# Patient Record
Sex: Female | Born: 1960
Health system: Southern US, Community
[De-identification: ages and names within clinical notes are randomized; demographics above are authoritative.]

## PROBLEM LIST (undated history)

## (undated) DIAGNOSIS — Z96659 Presence of unspecified artificial knee joint: Secondary | ICD-10-CM

## (undated) DIAGNOSIS — G4730 Sleep apnea, unspecified: Secondary | ICD-10-CM

## (undated) DIAGNOSIS — J449 Chronic obstructive pulmonary disease, unspecified: Secondary | ICD-10-CM

## (undated) DIAGNOSIS — E079 Disorder of thyroid, unspecified: Secondary | ICD-10-CM

## (undated) DIAGNOSIS — R7303 Prediabetes: Secondary | ICD-10-CM

## (undated) DIAGNOSIS — R112 Nausea with vomiting, unspecified: Secondary | ICD-10-CM

## (undated) DIAGNOSIS — R002 Palpitations: Secondary | ICD-10-CM

## (undated) DIAGNOSIS — I499 Cardiac arrhythmia, unspecified: Secondary | ICD-10-CM

## (undated) DIAGNOSIS — F419 Anxiety disorder, unspecified: Secondary | ICD-10-CM

## (undated) DIAGNOSIS — K219 Gastro-esophageal reflux disease without esophagitis: Secondary | ICD-10-CM

## (undated) DIAGNOSIS — M1990 Unspecified osteoarthritis, unspecified site: Secondary | ICD-10-CM

## (undated) DIAGNOSIS — Z9289 Personal history of other medical treatment: Secondary | ICD-10-CM

## (undated) DIAGNOSIS — T7840XA Allergy, unspecified, initial encounter: Secondary | ICD-10-CM

## (undated) DIAGNOSIS — J189 Pneumonia, unspecified organism: Secondary | ICD-10-CM

## (undated) DIAGNOSIS — Z8719 Personal history of other diseases of the digestive system: Secondary | ICD-10-CM

## (undated) DIAGNOSIS — Z9889 Other specified postprocedural states: Secondary | ICD-10-CM

## (undated) DIAGNOSIS — J42 Unspecified chronic bronchitis: Secondary | ICD-10-CM

## (undated) DIAGNOSIS — E785 Hyperlipidemia, unspecified: Secondary | ICD-10-CM

## (undated) DIAGNOSIS — E039 Hypothyroidism, unspecified: Secondary | ICD-10-CM

## (undated) DIAGNOSIS — E119 Type 2 diabetes mellitus without complications: Secondary | ICD-10-CM

## (undated) DIAGNOSIS — I10 Essential (primary) hypertension: Secondary | ICD-10-CM

## (undated) DIAGNOSIS — D649 Anemia, unspecified: Secondary | ICD-10-CM

## (undated) DIAGNOSIS — E559 Vitamin D deficiency, unspecified: Secondary | ICD-10-CM

## (undated) DIAGNOSIS — M810 Age-related osteoporosis without current pathological fracture: Secondary | ICD-10-CM

## (undated) DIAGNOSIS — C4491 Basal cell carcinoma of skin, unspecified: Secondary | ICD-10-CM

## (undated) HISTORY — DX: Personal history of other medical treatment: Z92.89

## (undated) HISTORY — DX: Age-related osteoporosis without current pathological fracture: M81.0

## (undated) HISTORY — PX: CARDIAC SURGERY: SHX584

## (undated) HISTORY — PX: KNEE ARTHROSCOPY: SUR90

## (undated) HISTORY — PX: POLYPECTOMY: SHX149

## (undated) HISTORY — DX: Hyperlipidemia, unspecified: E78.5

## (undated) HISTORY — DX: Allergy, unspecified, initial encounter: T78.40XA

## (undated) HISTORY — DX: Disorder of thyroid, unspecified: E07.9

## (undated) HISTORY — DX: Vitamin D deficiency, unspecified: E55.9

## (undated) HISTORY — PX: JOINT REPLACEMENT: SHX530

## (undated) HISTORY — PX: COLONOSCOPY: SHX174

## (undated) HISTORY — DX: Presence of unspecified artificial knee joint: Z96.659

## (undated) HISTORY — DX: Essential (primary) hypertension: I10

---

## 1985-08-13 HISTORY — PX: TUBAL LIGATION: SHX77

## 1988-08-13 HISTORY — PX: CARPAL TUNNEL RELEASE: SHX101

## 1991-08-14 HISTORY — PX: BLADDER REPAIR: SHX76

## 1998-02-14 ENCOUNTER — Emergency Department (HOSPITAL_COMMUNITY): Admission: EM | Admit: 1998-02-14 | Discharge: 1998-02-14 | Payer: Self-pay | Admitting: Emergency Medicine

## 1998-04-20 ENCOUNTER — Emergency Department (HOSPITAL_COMMUNITY): Admission: EM | Admit: 1998-04-20 | Discharge: 1998-04-20 | Payer: Self-pay | Admitting: Emergency Medicine

## 1998-09-12 ENCOUNTER — Emergency Department (HOSPITAL_COMMUNITY): Admission: EM | Admit: 1998-09-12 | Discharge: 1998-09-12 | Payer: Self-pay | Admitting: Emergency Medicine

## 1998-12-13 ENCOUNTER — Emergency Department (HOSPITAL_COMMUNITY): Admission: EM | Admit: 1998-12-13 | Discharge: 1998-12-13 | Payer: Self-pay | Admitting: Emergency Medicine

## 1999-05-04 ENCOUNTER — Encounter: Payer: Self-pay | Admitting: Emergency Medicine

## 1999-05-04 ENCOUNTER — Emergency Department (HOSPITAL_COMMUNITY): Admission: EM | Admit: 1999-05-04 | Discharge: 1999-05-04 | Payer: Self-pay | Admitting: Emergency Medicine

## 1999-10-09 ENCOUNTER — Other Ambulatory Visit: Admission: RE | Admit: 1999-10-09 | Discharge: 1999-10-09 | Payer: Self-pay | Admitting: Obstetrics and Gynecology

## 2000-03-17 ENCOUNTER — Encounter: Payer: Self-pay | Admitting: Emergency Medicine

## 2000-03-17 ENCOUNTER — Emergency Department (HOSPITAL_COMMUNITY): Admission: EM | Admit: 2000-03-17 | Discharge: 2000-03-17 | Payer: Self-pay | Admitting: Emergency Medicine

## 2000-07-02 ENCOUNTER — Encounter: Payer: Self-pay | Admitting: Emergency Medicine

## 2000-07-02 ENCOUNTER — Emergency Department (HOSPITAL_COMMUNITY): Admission: EM | Admit: 2000-07-02 | Discharge: 2000-07-02 | Payer: Self-pay | Admitting: Emergency Medicine

## 2001-05-30 ENCOUNTER — Encounter (INDEPENDENT_AMBULATORY_CARE_PROVIDER_SITE_OTHER): Payer: Self-pay | Admitting: *Deleted

## 2001-05-30 ENCOUNTER — Ambulatory Visit (HOSPITAL_COMMUNITY): Admission: RE | Admit: 2001-05-30 | Discharge: 2001-05-30 | Payer: Self-pay | Admitting: Gastroenterology

## 2001-05-30 ENCOUNTER — Encounter: Payer: Self-pay | Admitting: Gastroenterology

## 2001-06-02 ENCOUNTER — Ambulatory Visit (HOSPITAL_COMMUNITY): Admission: RE | Admit: 2001-06-02 | Discharge: 2001-06-02 | Payer: Self-pay | Admitting: Gastroenterology

## 2001-07-02 ENCOUNTER — Encounter: Payer: Self-pay | Admitting: Gastroenterology

## 2001-07-02 ENCOUNTER — Encounter: Admission: RE | Admit: 2001-07-02 | Discharge: 2001-07-02 | Payer: Self-pay | Admitting: Gastroenterology

## 2001-07-21 ENCOUNTER — Encounter: Payer: Self-pay | Admitting: Gastroenterology

## 2001-07-21 ENCOUNTER — Ambulatory Visit (HOSPITAL_COMMUNITY): Admission: RE | Admit: 2001-07-21 | Discharge: 2001-07-21 | Payer: Self-pay | Admitting: Gastroenterology

## 2001-11-16 ENCOUNTER — Emergency Department (HOSPITAL_COMMUNITY): Admission: EM | Admit: 2001-11-16 | Discharge: 2001-11-16 | Payer: Self-pay | Admitting: Emergency Medicine

## 2001-11-16 ENCOUNTER — Encounter: Payer: Self-pay | Admitting: *Deleted

## 2004-02-17 ENCOUNTER — Emergency Department (HOSPITAL_COMMUNITY): Admission: EM | Admit: 2004-02-17 | Discharge: 2004-02-17 | Payer: Self-pay | Admitting: Emergency Medicine

## 2004-06-18 ENCOUNTER — Emergency Department (HOSPITAL_COMMUNITY): Admission: AC | Admit: 2004-06-18 | Discharge: 2004-06-18 | Payer: Self-pay

## 2004-10-23 ENCOUNTER — Other Ambulatory Visit: Admission: RE | Admit: 2004-10-23 | Discharge: 2004-10-23 | Payer: Self-pay | Admitting: Internal Medicine

## 2004-10-23 LAB — HM PAP SMEAR: HM Pap smear: NORMAL

## 2005-01-05 ENCOUNTER — Ambulatory Visit (HOSPITAL_COMMUNITY): Admission: RE | Admit: 2005-01-05 | Discharge: 2005-01-05 | Payer: Self-pay | Admitting: Urology

## 2005-01-05 ENCOUNTER — Ambulatory Visit (HOSPITAL_BASED_OUTPATIENT_CLINIC_OR_DEPARTMENT_OTHER): Admission: RE | Admit: 2005-01-05 | Discharge: 2005-01-05 | Payer: Self-pay | Admitting: Urology

## 2005-12-05 ENCOUNTER — Encounter: Admission: RE | Admit: 2005-12-05 | Discharge: 2005-12-05 | Payer: Self-pay | Admitting: Orthopedic Surgery

## 2006-07-22 ENCOUNTER — Ambulatory Visit (HOSPITAL_COMMUNITY): Admission: RE | Admit: 2006-07-22 | Discharge: 2006-07-22 | Payer: Self-pay | Admitting: Internal Medicine

## 2007-08-14 HISTORY — PX: ENDOMETRIAL ABLATION: SHX621

## 2008-02-22 ENCOUNTER — Encounter: Admission: RE | Admit: 2008-02-22 | Discharge: 2008-02-22 | Payer: Self-pay | Admitting: Internal Medicine

## 2008-08-13 HISTORY — PX: INCONTINENCE SURGERY: SHX676

## 2008-10-13 ENCOUNTER — Ambulatory Visit (HOSPITAL_COMMUNITY): Admission: RE | Admit: 2008-10-13 | Discharge: 2008-10-13 | Payer: Self-pay | Admitting: Internal Medicine

## 2009-06-22 ENCOUNTER — Encounter: Admission: RE | Admit: 2009-06-22 | Discharge: 2009-06-22 | Payer: Self-pay | Admitting: Orthopedic Surgery

## 2009-08-25 ENCOUNTER — Encounter: Admission: RE | Admit: 2009-08-25 | Discharge: 2009-08-25 | Payer: Self-pay | Admitting: Occupational Medicine

## 2010-03-08 ENCOUNTER — Encounter: Admission: RE | Admit: 2010-03-08 | Discharge: 2010-03-08 | Payer: Self-pay | Admitting: Gastroenterology

## 2010-04-29 ENCOUNTER — Emergency Department (HOSPITAL_COMMUNITY): Admission: EM | Admit: 2010-04-29 | Discharge: 2010-04-29 | Payer: Self-pay | Admitting: Emergency Medicine

## 2010-05-09 ENCOUNTER — Encounter: Admission: RE | Admit: 2010-05-09 | Discharge: 2010-05-09 | Payer: Self-pay | Admitting: Orthopedic Surgery

## 2010-05-31 ENCOUNTER — Emergency Department (HOSPITAL_COMMUNITY): Admission: EM | Admit: 2010-05-31 | Discharge: 2010-05-31 | Payer: Self-pay | Admitting: Family Medicine

## 2010-08-28 ENCOUNTER — Encounter
Admission: RE | Admit: 2010-08-28 | Discharge: 2010-08-28 | Payer: Self-pay | Source: Home / Self Care | Attending: Internal Medicine | Admitting: Internal Medicine

## 2010-09-03 ENCOUNTER — Encounter: Payer: Self-pay | Admitting: Gastroenterology

## 2010-09-06 ENCOUNTER — Encounter
Admission: RE | Admit: 2010-09-06 | Discharge: 2010-09-06 | Payer: Self-pay | Source: Home / Self Care | Attending: Internal Medicine | Admitting: Internal Medicine

## 2010-09-12 ENCOUNTER — Other Ambulatory Visit: Payer: Self-pay | Admitting: Radiology

## 2010-09-12 ENCOUNTER — Encounter
Admission: RE | Admit: 2010-09-12 | Discharge: 2010-09-12 | Payer: Self-pay | Source: Home / Self Care | Attending: Internal Medicine | Admitting: Internal Medicine

## 2010-12-29 NOTE — Op Note (Signed)
NAME:  Sherry Dennis, Sherry Dennis                  ACCOUNT NO.:  [id number]   MEDICAL RECORD NO.:  [id number]          PATIENT TYPE:  AMB   LOCATION:  [facility]                         FACILITY:  [facility]   PHYSICIAN:  Mark C. Vernie Ammons, M.D.  DATE OF BIRTH:  28-Mar-1961   DATE OF PROCEDURE:  01/05/2005  DATE OF DISCHARGE:                                 OPERATIVE REPORT   PREOPERATIVE DIAGNOSIS:  Stress urinary incontinence.   POSTOPERATIVE DIAGNOSIS:  Stress urinary incontinence.   PROCEDURE:  Transobturator sling Conservation officer, historic buildings).   SURGEON:  Dr. Vernie Ammons.   ANESTHESIA:  General.   BLOOD LOSS:  Minimal.   DRAINS:  None.   SPECIMENS:  None.   COMPLICATIONS:  None.   INDICATIONS:  The patient is a 50 year old white female, who has had  worsening urinary incontinence occurring primarily with coughing, sneezing,  abdominal straining, requiring a pad.  She had some frequency, urgency, and  nocturia and therefore, urodynamics were performed, but she was found to  have no bladder instability.  She therefore is brought to the OR for  surgical correction of her incontinence.  She understands the risks,  complications, and alternatives.   DESCRIPTION OF OPERATION:  After informed consent, the patient brought to  the major OR, placed on the table, administered general anesthesia, then  moved to the dorsal lithotomy position.  Her genitalia and vagina were  sterilely prepped and draped, and a 16-French Foley catheter was placed in  the bladder.  Lidocaine 1% with epinephrine was used to infiltrate the  subvaginal mucosa in the midline, and then palpation of the balloon at the  bladder neck allowed determination of the mid urethral region.  While  awaiting the epinephrine effects, I made stab incisions at the medial aspect  of the obturator fossa 5 cm lateral and at the level of the clitoris.  I  then made a midline incision beneath the urethra and dissected laterally on  both sides of the urethra.  The  bladder was then fully drained, and the  obturator trocar was then passed through the skin incision first on the left-  hand side and out at the mid urethral level through the vaginal incision.  This was performed on the right side as well in an identical fashion.  I  then hooked the sling material to the obturators and brought that back  through the skin incisions.   The catheter was removed, and the 21-French cystoscope was then inserted in  the bladder with 70-degree lens.  The bladder was fully inspect and noted be  free of any tumor, stones, or inflammatory lesions.  No foreign body or  injury were noted.  The ureteral orifices normal in configuration and  position.  I therefore removed the cystoscope and reinserted the Foley  catheter.  The sling was then tightened to the correct tension where it laid  just up against the urethra with no tension.  The plastic sheathing was then  removed from the right and then left sides, and I then placed a right angle  beneath the urethra a second time  to ensure that no extra tension was  applied.  None was noted in the sling, and the sling was still in good  position at the mid urethra with no undue tension.  I therefore cut the  excess sling material at the skin level and then closed the skin incisions  with Dermabond.  I irrigated the vaginal incision with antibiotic solution  and then closed that with running locking 2-0 Vicryl suture.  The catheter  was then removed, and the patient was awakened and taken to the recovery  room in stable satisfactory condition.  She tolerated the procedure well  with no intraoperative complications.   She will be given a prescription for Vicodin HP #36 and be maintained on  Cipro 500 mg b.i.d. for 5 days.  She will return to my office in 7 days for  follow-up.      MCO/MEDQ  D:  01/05/2005  T:  01/05/2005  Job:  [id number]

## 2010-12-29 NOTE — Procedures (Signed)
[facility]  Patient:    DEDRA, MATSUO Visit Number: [id number] MRN: [id number]          Service Type: Attending:  Petra Kuba, M.D. Dictated by:   Petra Kuba, M.D. Proc. Date: 05/30/01   CC:         Ammie Dalton, M.D.                           Procedure Report  PROCEDURE:  Esophagogastroduodenoscopy with biopsy and Savary dilatation.  INDICATION:  Upper tract symptoms and dysphagia not responding to the usual medicines.  Consent was signed after the risks, benefits, methods, and options were thoroughly discussed in the office.  MEDICATIONS:  Demerol 70 mg, Versed 8 mg.  DESCRIPTION OF PROCEDURE:  The video endoscope was inserted by direct vision. The esophagus was normal.  In the distal esophagus was a small hiatal hernia. No obvious stricture or ring seen.  The scope passed easily into the stomach and advanced through a normal pylorus into a normal duodenal bulb, then around the C-loop to a normal second portion of the duodenum.  A normal-appearing ampulla was seen.  The scope was slowly withdrawn back to the bulb, which again looked normal.  The scope was withdrawn back to the stomach and retroflexed.  High in the cardia, the hiatal hernia was confirmed.  There was both some proximal gastritis and some distal antritis.  The lesser and greater curve on retroflex visualization and straight visualization looked normal. The scope was then slowly withdrawn back to 20 cm.  No additional findings were seen.  The scope was readvanced in the stomach, and two proximal biopsies and two biopsies of the antrum were obtained at the area of inflammation.  The Savary wire was then advanced and confirmed in the proper position in the stomach under fluoroscopy with the customary J-loop being seen in the antrum. The scope was removed, making sure to keep the wire in the proper location under fluoroscopy.  Then we went ahead and proceeded with a 16 mm  Savary dilator under fluoroscopy.  Once the dilator was confirmed in the proper position in the stomach, the dilator and the wire were removed in tandem.  The procedure was terminated.  The patient tolerated the procedure well.  There was no obvious immediate complication.  ENDOSCOPIC DIAGNOSES: 1. Small hiatal hernia without any obvious ring or stricture. 2. Gastritis, both proximal and distal, status post biopsy. 3. Otherwise normal EGD.  THERAPY:  Savary dilatation to 16 mm under fluoroscopy.  PLAN:  See how the dilation worked.  Go ahead and recheck symptoms.  Await pathology.  Consider a trial of treatment of Helicobacter if present; otherwise, would probably try a motility agent like Reglan next.  Call p.r.n. and otherwise follow in two months. Dictated by:   Petra Kuba, M.D. Attending:  Petra Kuba, M.D. DD:  05/30/01 TD:  06/02/01 Job: [id number]

## 2011-05-08 ENCOUNTER — Other Ambulatory Visit: Payer: Self-pay | Admitting: Orthopedic Surgery

## 2011-05-08 DIAGNOSIS — M47818 Spondylosis without myelopathy or radiculopathy, sacral and sacrococcygeal region: Secondary | ICD-10-CM

## 2011-05-09 ENCOUNTER — Ambulatory Visit
Admission: RE | Admit: 2011-05-09 | Discharge: 2011-05-09 | Disposition: A | Discharge status: Home / Self Care | Payer: 59 | Source: Ambulatory Visit | Attending: Orthopedic Surgery | Admitting: Orthopedic Surgery

## 2011-05-09 DIAGNOSIS — M47818 Spondylosis without myelopathy or radiculopathy, sacral and sacrococcygeal region: Secondary | ICD-10-CM

## 2011-05-09 DIAGNOSIS — M461 Sacroiliitis, not elsewhere classified: Secondary | ICD-10-CM

## 2011-05-09 MED ORDER — METHYLPREDNISOLONE ACETATE 40 MG/ML INJ SUSP (RADIOLOG
120.0000 mg | Freq: Once | INTRAMUSCULAR | Status: AC
  Administered 2011-05-09: 120 mg via INTRA_ARTICULAR

## 2011-05-09 MED ORDER — IOHEXOL 180 MG/ML  SOLN
1.0000 mL | Freq: Once | INTRAMUSCULAR | Status: AC | PRN
  Administered 2011-05-09: 1 mL via INTRA_ARTICULAR

## 2011-08-14 HISTORY — PX: KNEE ARTHROSCOPY: SHX127

## 2011-12-17 ENCOUNTER — Other Ambulatory Visit: Payer: Self-pay | Admitting: Orthopedic Surgery

## 2011-12-17 DIAGNOSIS — M47818 Spondylosis without myelopathy or radiculopathy, sacral and sacrococcygeal region: Secondary | ICD-10-CM

## 2011-12-21 ENCOUNTER — Ambulatory Visit
Admission: RE | Admit: 2011-12-21 | Discharge: 2011-12-21 | Disposition: A | Discharge status: Home / Self Care | Payer: 59 | Source: Ambulatory Visit | Attending: Orthopedic Surgery | Admitting: Orthopedic Surgery

## 2011-12-21 ENCOUNTER — Other Ambulatory Visit: Payer: Self-pay | Admitting: Orthopedic Surgery

## 2011-12-21 DIAGNOSIS — M47818 Spondylosis without myelopathy or radiculopathy, sacral and sacrococcygeal region: Secondary | ICD-10-CM

## 2011-12-21 MED ORDER — IOHEXOL 180 MG/ML  SOLN
1.0000 mL | Freq: Once | INTRAMUSCULAR | Status: AC | PRN
  Administered 2011-12-21: 1 mL via INTRA_ARTICULAR

## 2011-12-21 MED ORDER — METHYLPREDNISOLONE ACETATE 40 MG/ML INJ SUSP (RADIOLOG
120.0000 mg | Freq: Once | INTRAMUSCULAR | Status: AC
  Administered 2011-12-21: 120 mg via INTRA_ARTICULAR

## 2012-02-21 ENCOUNTER — Ambulatory Visit
Admission: RE | Admit: 2012-02-21 | Discharge: 2012-02-21 | Disposition: A | Discharge status: Home / Self Care | Payer: 59 | Source: Ambulatory Visit | Attending: Orthopedic Surgery | Admitting: Orthopedic Surgery

## 2012-02-21 ENCOUNTER — Other Ambulatory Visit: Payer: Self-pay | Admitting: Orthopedic Surgery

## 2012-02-21 DIAGNOSIS — M79604 Pain in right leg: Secondary | ICD-10-CM

## 2012-03-03 ENCOUNTER — Other Ambulatory Visit: Payer: Self-pay | Admitting: Orthopedic Surgery

## 2012-03-03 DIAGNOSIS — M7120 Synovial cyst of popliteal space [Baker], unspecified knee: Secondary | ICD-10-CM

## 2012-03-11 ENCOUNTER — Ambulatory Visit
Admission: RE | Admit: 2012-03-11 | Discharge: 2012-03-11 | Disposition: A | Discharge status: Home / Self Care | Payer: 59 | Source: Ambulatory Visit | Attending: Orthopedic Surgery | Admitting: Orthopedic Surgery

## 2012-03-11 DIAGNOSIS — M7120 Synovial cyst of popliteal space [Baker], unspecified knee: Secondary | ICD-10-CM

## 2012-04-09 ENCOUNTER — Other Ambulatory Visit (HOSPITAL_COMMUNITY): Payer: Self-pay | Admitting: Internal Medicine

## 2012-04-09 DIAGNOSIS — Z1231 Encounter for screening mammogram for malignant neoplasm of breast: Secondary | ICD-10-CM

## 2012-05-07 ENCOUNTER — Ambulatory Visit (HOSPITAL_COMMUNITY)
Admission: RE | Admit: 2012-05-07 | Discharge: 2012-05-07 | Disposition: A | Discharge status: Home / Self Care | Payer: 59 | Source: Ambulatory Visit | Attending: Internal Medicine | Admitting: Internal Medicine

## 2012-05-07 ENCOUNTER — Other Ambulatory Visit (HOSPITAL_COMMUNITY): Payer: Self-pay | Admitting: Internal Medicine

## 2012-05-07 DIAGNOSIS — I10 Essential (primary) hypertension: Secondary | ICD-10-CM

## 2012-05-07 DIAGNOSIS — Z1231 Encounter for screening mammogram for malignant neoplasm of breast: Secondary | ICD-10-CM

## 2012-05-07 DIAGNOSIS — F17200 Nicotine dependence, unspecified, uncomplicated: Secondary | ICD-10-CM | POA: Insufficient documentation

## 2012-05-07 LAB — HM MAMMOGRAPHY: HM Mammogram: NORMAL

## 2012-08-13 HISTORY — PX: BASAL CELL CARCINOMA EXCISION: SHX1214

## 2013-06-17 ENCOUNTER — Telehealth: Payer: Self-pay | Admitting: *Deleted

## 2013-06-26 NOTE — Telephone Encounter (Signed)
Pt advised.

## 2013-07-13 ENCOUNTER — Other Ambulatory Visit: Payer: Self-pay

## 2013-07-21 ENCOUNTER — Encounter: Payer: Self-pay | Admitting: Internal Medicine

## 2013-07-21 ENCOUNTER — Ambulatory Visit (INDEPENDENT_AMBULATORY_CARE_PROVIDER_SITE_OTHER): Payer: 59 | Admitting: Internal Medicine

## 2013-07-21 VITALS — BP 126/82 | HR 88 | Temp 98.1°F | Resp 18 | Wt 211.4 lb

## 2013-07-21 DIAGNOSIS — J0410 Acute tracheitis without obstruction: Secondary | ICD-10-CM

## 2013-07-21 DIAGNOSIS — E079 Disorder of thyroid, unspecified: Secondary | ICD-10-CM | POA: Insufficient documentation

## 2013-07-21 DIAGNOSIS — J45909 Unspecified asthma, uncomplicated: Secondary | ICD-10-CM

## 2013-07-21 DIAGNOSIS — I10 Essential (primary) hypertension: Secondary | ICD-10-CM | POA: Insufficient documentation

## 2013-07-21 DIAGNOSIS — E785 Hyperlipidemia, unspecified: Secondary | ICD-10-CM | POA: Insufficient documentation

## 2013-07-21 DIAGNOSIS — J029 Acute pharyngitis, unspecified: Secondary | ICD-10-CM

## 2013-07-21 DIAGNOSIS — J019 Acute sinusitis, unspecified: Secondary | ICD-10-CM

## 2013-07-21 DIAGNOSIS — E559 Vitamin D deficiency, unspecified: Secondary | ICD-10-CM | POA: Insufficient documentation

## 2013-07-21 MED ORDER — HYDROCODONE-ACETAMINOPHEN 5-325 MG PO TABS: ORAL_TABLET | ORAL | Status: AC

## 2013-07-21 MED ORDER — LEVOFLOXACIN 500 MG PO TABS: 500.0000 mg | ORAL_TABLET | Freq: Every day | ORAL | Status: AC

## 2013-07-21 MED ORDER — PREDNISONE 20 MG PO TABS: 20.0000 mg | ORAL_TABLET | ORAL | Status: DC

## 2013-07-21 NOTE — Progress Notes (Signed)
Subjective:     Patient ID: Cam Hai, female   DOB: 1960-10-10, 52 y.o.   MRN: [id number]  Sore Throat  This is a recurrent problem. The current episode started in the past 7 days. The problem has been gradually worsening. There has been no fever. The patient is experiencing no pain. Associated symptoms include congestion, coughing, ear pain, headaches, a hoarse voice, shortness of breath and swollen glands. Pertinent negatives include no stridor or trouble swallowing.  Headache  Associated symptoms include coughing, ear pain, a fever, rhinorrhea, sinus pressure, a sore throat and swollen glands.     Review of Systems  Constitutional: Positive for fever, chills, diaphoresis and fatigue. Negative for appetite change.  HENT: Positive for congestion, ear pain, hoarse voice, postnasal drip, rhinorrhea, sinus pressure and sore throat. Negative for sneezing, trouble swallowing and voice change.   Eyes: Negative.   Respiratory: Positive for cough, chest tightness, shortness of breath and wheezing. Negative for apnea, choking and stridor.   Cardiovascular: Negative.   Gastrointestinal: Negative.   Musculoskeletal: Negative.   Neurological: Positive for headaches.       Objective:   Physical Exam  Constitutional: She appears well-nourished. No distress.  HENT:  Mouth/Throat: Oropharyngeal exudate present.  Eyes: Conjunctivae and EOM are normal. Pupils are equal, round, and reactive to light. Right eye exhibits no discharge. Left eye exhibits no discharge.  Neck: Neck supple. No JVD present. No thyromegaly present.  Cardiovascular: Normal rate, regular rhythm and normal heart sounds.   No murmur heard. Pulmonary/Chest: Effort normal. No respiratory distress. She has wheezes. She has rales. She exhibits no tenderness.  Lymphadenopathy:    She has no cervical adenopathy.  Skin: Skin is warm and dry. No rash noted. She is not diaphoretic.  Psychiatric: She has a normal mood and affect.        1. Acute tracheitis without mention of obstruction  - levofloxacin (LEVAQUIN) 500 MG tablet; Take 1 tablet (500 mg total) by mouth daily. Take 1 tablet daily with food for infection  Dispense: 15 tablet; Refill: 0 - HYDROcodone-acetaminophen (NORCO) 5-325 MG per tablet; Take  1/2 to 1 tablet every 3 to 4 hours as needed for cough or pain  Dispense: 50 tablet; Refill: 0  2. Intrinsic asthma, unspecified  - predniSONE (DELTASONE) 20 MG tablet; Take 1 tablet (20 mg total) by mouth See admin instructions. 1 tab 3 x day for 3 days, then 1 tab 2 x day for 3 days, then 1 tab 1 x day for 5 days  Dispense: 20 tablet; Refill: 0  3. Acute sinusitis, unspecified   4. Acute pharyngitis

## 2013-07-21 NOTE — Patient Instructions (Signed)
Asthma, Adult Asthma is a recurring condition in which the airways tighten and narrow. Asthma can make it difficult to breathe. It can cause coughing, wheezing, and shortness of breath. Asthma episodes (also called asthma attacks) range from minor to life-threatening. Asthma cannot be cured, but medicines and lifestyle changes can help control it. CAUSES Asthma is believed to be caused by inherited (genetic) and environmental factors, but its exact cause is unknown. Asthma may be triggered by allergens, lung infections, or irritants in the air. Asthma triggers are different for each person. Common triggers include:   Animal dander.  Dust mites.  Cockroaches.  Pollen from trees or grass.  Mold.  Smoke.  Air pollutants such as dust, household cleaners, hair sprays, aerosol sprays, paint fumes, strong chemicals, or strong odors.  Cold air, weather changes, and winds (which increase molds and pollens in the air).  Strong emotional expressions such as crying or laughing hard.  Stress.  Certain medicines (such as aspirin) or types of drugs (such as beta-blockers).  Sulfites in foods and drinks. Foods and drinks that may contain sulfites include dried fruit, potato chips, and sparkling grape juice.  Infections or inflammatory conditions such as the flu, a cold, or an inflammation of the nasal membranes (rhinitis).  Gastroesophageal reflux disease (GERD).  Exercise or strenuous activity. SYMPTOMS Symptoms may occur immediately after asthma is triggered or many hours later. Symptoms include:  Wheezing.  Excessive nighttime or early morning coughing.  Frequent or severe coughing with a common cold.  Chest tightness.  Shortness of breath. DIAGNOSIS  The diagnosis of asthma is made by a review of your medical history and a physical exam. Tests may also be performed. These may include:  Lung function studies. These tests show how much air you breath in and out.  Allergy  tests.  Imaging tests such as X-rays. TREATMENT  Asthma cannot be cured, but it can usually be controlled. Treatment involves identifying and avoiding your asthma triggers. It also involves medicines. There are 2 classes of medicine used for asthma treatment:   Controller medicines. These prevent asthma symptoms from occurring. They are usually taken every day.  Reliever or rescue medicines. These quickly relieve asthma symptoms. They are used as needed and provide short-term relief. Your health care provider will help you create an asthma action plan. An asthma action plan is a written plan for managing and treating your asthma attacks. It includes a list of your asthma triggers and how they may be avoided. It also includes information on when medicines should be taken and when their dosage should be changed. An action plan may also involve the use of a device called a peak flow meter. A peak flow meter measures how well the lungs are working. It helps you monitor your condition. HOME CARE INSTRUCTIONS   Take medicine as directed by your health care provider. Speak with your health care provider if you have questions about how or when to take the medicines.  Use a peak flow meter as directed by your health care provider. Record and keep track of readings.  Understand and use the action plan to help minimize or stop an asthma attack without needing to seek medical care.  Control your home environment in the following ways to help prevent asthma attacks:  Do not smoke. Avoid being exposed to secondhand smoke.  Change your heating and air conditioning filter regularly.  Limit your use of fireplaces and wood stoves.  Get rid of pests (such as roaches and   mice) and their droppings. °· Throw away plants if you see mold on them. °· Clean your floors and dust regularly. Use unscented cleaning products. °· Try to have someone else vacuum for you regularly. Stay out of rooms while they are being  vacuumed and for a short while afterward. If you vacuum, use a dust mask from a hardware store, a double-layered or microfilter vacuum cleaner bag, or a vacuum cleaner with a HEPA filter. °· Replace carpet with wood, tile, or vinyl flooring. Carpet can trap dander and dust. °· Use allergy-proof pillows, mattress covers, and box spring covers. °· Wash bed sheets and blankets every week in hot water and dry them in a dryer. °· Use blankets that are made of polyester or cotton. °· Clean bathrooms and kitchens with bleach. If possible, have someone repaint the walls in these rooms with mold-resistant paint. Keep out of the rooms that are being cleaned and painted. °· Wash hands frequently. °SEEK MEDICAL CARE IF:  °· You have wheezing, shortness of breath, or a cough even if taking medicine to prevent attacks. °· The colored mucus you cough up (sputum) is thicker than usual. °· Your sputum changes from clear or white to yellow, green, gray, or bloody. °· You have any problems that may be related to the medicines you are taking (such as a rash, itching, swelling, or trouble breathing). °· You are using a reliever medicine more than 2 3 times per week. °· Your peak flow is still at 50 79% of you personal best after following your action plan for 1 hour. °SEEK IMMEDIATE MEDICAL CARE IF:  °· You seem to be getting worse and are unresponsive to treatment during an asthma attack. °· You are short of breath even at rest. °· You get short of breath when doing very little physical activity. °· You have difficulty eating, drinking, or talking due to asthma symptoms. °· You develop chest pain. °· You develop a fast heartbeat. °· You have a bluish color to your lips or fingernails. °· You are lightheaded, dizzy, or faint. °· Your peak flow is less than 50% of your personal best. °· You have a fever or persistent symptoms for more than 2 3 days. °· You have a fever and symptoms suddenly get worse. °MAKE SURE YOU:  °· Understand these  instructions. °· Will watch your condition. °· Will get help right away if you are not doing well or get worse. °Document Released: 07/30/2005 Document Revised: 04/01/2013 Document Reviewed: 02/26/2013 °Exit[employer]® Patient Information ©2014 [employer]. ° °Bronchitis °Bronchitis is the body's way of reacting to injury and/or infection (inflammation) of the bronchi. Bronchi are the air tubes that extend from the windpipe into the lungs. If the inflammation becomes severe, it may cause shortness of breath. °CAUSES  °Inflammation may be caused by: °· A virus. °· Germs (bacteria). °· Dust. °· Allergens. °· Pollutants and many other irritants. °The cells lining the bronchial tree are covered with tiny hairs (cilia). These constantly beat upward, away from the lungs, toward the mouth. This keeps the lungs free of pollutants. When these cells become too irritated and are unable to do their job, mucus begins to develop. This causes the characteristic cough of bronchitis. The cough clears the lungs when the cilia are unable to do their job. Without either of these protective mechanisms, the mucus would settle in the lungs. Then you would develop pneumonia. °Smoking is a common cause of bronchitis and can contribute to pneumonia. Stopping this habit   is the single most important thing you can do to help yourself. °TREATMENT  °· Your caregiver may prescribe an antibiotic if the cough is caused by bacteria. Also, medicines that open up your airways make it easier to breathe. Your caregiver may also recommend or prescribe an expectorant. It will loosen the mucus to be coughed up. Only take over-the-counter or prescription medicines for pain, discomfort, or fever as directed by your caregiver. °· Removing whatever causes the problem (smoking, for example) is critical to preventing the problem from getting worse. °· Cough suppressants may be prescribed for relief of cough symptoms. °· Inhaled medicines may be prescribed to help  with symptoms now and to help prevent problems from returning. °· For those with recurrent (chronic) bronchitis, there may be a need for steroid medicines. °SEEK IMMEDIATE MEDICAL CARE IF:  °· During treatment, you develop more pus-like mucus (purulent sputum). °· You have a fever. °· You become progressively more ill. °· You have increased difficulty breathing, wheezing, or shortness of breath. °It is necessary to seek immediate medical care if you are elderly or sick from any other disease. °MAKE SURE YOU:  °· Understand these instructions. °· Will watch your condition. °· Will get help right away if you are not doing well or get worse. °Document Released: 07/30/2005 Document Revised: 04/01/2013 Document Reviewed: 03/24/2013 °ExitCare® Patient Information ©2014 [employer]. ° °

## 2013-07-22 ENCOUNTER — Ambulatory Visit: Payer: Self-pay | Admitting: Emergency Medicine

## 2013-08-26 ENCOUNTER — Encounter: Payer: Self-pay | Admitting: Emergency Medicine

## 2013-08-26 ENCOUNTER — Ambulatory Visit (INDEPENDENT_AMBULATORY_CARE_PROVIDER_SITE_OTHER): Payer: 59 | Admitting: Emergency Medicine

## 2013-08-26 VITALS — BP 136/98 | HR 74 | Temp 97.8°F | Resp 18 | Ht 66.0 in | Wt 215.0 lb

## 2013-08-26 DIAGNOSIS — Z113 Encounter for screening for infections with a predominantly sexual mode of transmission: Secondary | ICD-10-CM

## 2013-08-26 DIAGNOSIS — E538 Deficiency of other specified B group vitamins: Secondary | ICD-10-CM

## 2013-08-26 DIAGNOSIS — I10 Essential (primary) hypertension: Secondary | ICD-10-CM

## 2013-08-26 DIAGNOSIS — E039 Hypothyroidism, unspecified: Secondary | ICD-10-CM

## 2013-08-26 DIAGNOSIS — R9431 Abnormal electrocardiogram [ECG] [EKG]: Secondary | ICD-10-CM

## 2013-08-26 DIAGNOSIS — R7309 Other abnormal glucose: Secondary | ICD-10-CM

## 2013-08-26 DIAGNOSIS — R350 Frequency of micturition: Secondary | ICD-10-CM

## 2013-08-26 DIAGNOSIS — E782 Mixed hyperlipidemia: Secondary | ICD-10-CM

## 2013-08-26 DIAGNOSIS — E559 Vitamin D deficiency, unspecified: Secondary | ICD-10-CM

## 2013-08-26 LAB — CBC WITH DIFFERENTIAL/PLATELET
Basophils Absolute: 0 10*3/uL (ref 0.0–0.1)
Basophils Relative: 0 % (ref 0–1)
EOS ABS: 0.1 10*3/uL (ref 0.0–0.7)
EOS PCT: 1 % (ref 0–5)
HCT: 38.1 % (ref 36.0–46.0)
HEMOGLOBIN: 13.2 g/dL (ref 12.0–15.0)
LYMPHS ABS: 2.2 10*3/uL (ref 0.7–4.0)
Lymphocytes Relative: 34 % (ref 12–46)
MCH: 32 pg (ref 26.0–34.0)
MCHC: 34.6 g/dL (ref 30.0–36.0)
MCV: 92.3 fL (ref 78.0–100.0)
Monocytes Absolute: 0.5 10*3/uL (ref 0.1–1.0)
Monocytes Relative: 8 % (ref 3–12)
NEUTROS PCT: 57 % (ref 43–77)
Neutro Abs: 3.6 10*3/uL (ref 1.7–7.7)
Platelets: 222 10*3/uL (ref 150–400)
RBC: 4.13 MIL/uL (ref 3.87–5.11)
RDW: 13.3 % (ref 11.5–15.5)
WBC: 6.3 10*3/uL (ref 4.0–10.5)

## 2013-08-26 LAB — HEMOGLOBIN A1C
Hgb A1c MFr Bld: 6.1 % — ABNORMAL HIGH (ref <5.7)
Mean Plasma Glucose: 128 mg/dL — ABNORMAL HIGH (ref <117)

## 2013-08-26 LAB — BASIC METABOLIC PANEL WITH GFR
BUN: 10 mg/dL (ref 6–23)
CO2: 29 meq/L (ref 19–32)
CREATININE: 0.76 mg/dL (ref 0.50–1.10)
Calcium: 9.5 mg/dL (ref 8.4–10.5)
Chloride: 101 mEq/L (ref 96–112)
GFR, Est African American: >89 mL/min
GFR, Est Non African American: >89 mL/min
Glucose, Bld: 93 mg/dL (ref 70–99)
Potassium: 4.2 mEq/L (ref 3.5–5.3)
Sodium: 140 mEq/L (ref 135–145)

## 2013-08-26 LAB — LIPID PANEL
Cholesterol: 172 mg/dL (ref 0–200)
HDL: 48 mg/dL (ref >39)
LDL Cholesterol: 94 mg/dL (ref 0–99)
TRIGLYCERIDES: 149 mg/dL (ref <150)
Total CHOL/HDL Ratio: 3.6 Ratio
VLDL: 30 mg/dL (ref 0–40)

## 2013-08-26 LAB — HEPATIC FUNCTION PANEL
ALBUMIN: 4.3 g/dL (ref 3.5–5.2)
ALT: 28 U/L (ref 0–35)
AST: 21 U/L (ref 0–37)
Alkaline Phosphatase: 79 U/L (ref 39–117)
BILIRUBIN INDIRECT: 0.4 mg/dL (ref 0.0–0.9)
Bilirubin, Direct: 0.1 mg/dL (ref 0.0–0.3)
TOTAL PROTEIN: 6.9 g/dL (ref 6.0–8.3)
Total Bilirubin: 0.5 mg/dL (ref 0.3–1.2)

## 2013-08-26 LAB — MAGNESIUM: Magnesium: 2 mg/dL (ref 1.5–2.5)

## 2013-08-26 MED ORDER — FAMCICLOVIR 500 MG PO TABS: 500.0000 mg | ORAL_TABLET | Freq: Three times a day (TID) | ORAL | Status: DC

## 2013-08-26 NOTE — Progress Notes (Signed)
Subjective:    Patient ID: Sherry Dennis, female    DOB: [birth year], 53 y.o.   MRN: [id number]  HPI Comments: 53 yo obese female presents for 3 month F/U for HTN, Cholesterol, Pre-Dm, D. Deficient. LAST LABS MAG 1.9 TSH .099 B12 239 D 32 A1C 5.6 T 142 TG 189 L 69 H 35 SHE IS NOT EATING HEALTHY. She is not exercising on routine basis but keeps active.  She is trying to decrease vapor nicotine level slowly.The last cigarette was before Christmas and she wants to completely stop all nicotine in the future. She has seen mild improvement with energy with adding vitamins AD.She needs f/u labs rechecked for TSH, B12, Vit D.   She wants to try weight loss pill, she notes increased heart rate with Phentermine. She negative evaluation with Dr Tawnya Crook in past.She has noticed occasional hard beats without triggers. She notes they occasionally occur after eating. She notes occasional SOB with exertion. She denies CP.   She notes increased urine frequency for several weeks. She has not changed any diet and does not drink enough water.She noticed blister over 1 month that resolved but always felt unusual sensation in the spot where blister was on her labia. She denies previous STD HX. She has recurrent boils in the genital area with occasional smell.   Current Outpatient Prescriptions on File Prior to Visit  Medication Sig Dispense Refill  . Cholecalciferol (VITAMIN D PO) Take 5,000 Int'l Units by mouth daily.      . Cyanocobalamin (VITAMIN B-12 PO) Take by mouth daily.      . hydrochlorothiazide (HYDRODIURIL) 25 MG tablet Take 25 mg by mouth as needed.      Marland Kitchen levothyroxine (SYNTHROID, LEVOTHROID) 100 MCG tablet Take 100 mcg by mouth daily before breakfast. 1/2 of 100 QD      . meloxicam (MOBIC) 15 MG tablet Take 15 mg by mouth daily. Takes prn       No current facility-administered medications on file prior to visit.   ALLERGIES Darvocet; Darvon; and Tamiflu  Past Medical History  Diagnosis Date  .  Hypertension   . Hyperlipidemia   . Thyroid disease   . Vitamin D deficiency      Review of Systems  Respiratory: Positive for shortness of breath.   Cardiovascular: Positive for palpitations.  Genitourinary: Positive for frequency and genital sores.  All other systems reviewed and are negative.   BP 136/98  Pulse 74  Temp(Src) 97.8 F (36.6 C) (Temporal)  Resp 18  Ht 5\' 6"  (1.676 m)  Wt 215 lb (97.523 kg)  BMI 34.72 kg/m2     Objective:   Physical Exam  Nursing note and vitals reviewed. Constitutional: She is oriented to person, place, and time. She appears well-developed and well-nourished. No distress.  Obese  HENT:  Head: Normocephalic and atraumatic.  Right Ear: External ear normal.  Left Ear: External ear normal.  Nose: Nose normal.  Mouth/Throat: Oropharynx is clear and moist.  Eyes: Conjunctivae and EOM are normal.  Neck: Normal range of motion. Neck supple. No JVD present. No thyromegaly present.  Cardiovascular: Normal rate, regular rhythm, normal heart sounds and intact distal pulses.   Pulmonary/Chest: Effort normal and breath sounds normal.  Abdominal: Soft. Bowel sounds are normal. She exhibits no distension and no mass. There is no tenderness. There is no rebound and no guarding.  Genitourinary:     Musculoskeletal: Normal range of motion. She exhibits no edema and no tenderness.  Lymphadenopathy:  She has no cervical adenopathy.  Neurological: She is alert and oriented to person, place, and time. No cranial nerve deficit.  Skin: Skin is warm and dry. No rash noted. No erythema. No pallor.  Psychiatric: She has a normal mood and affect. Her behavior is normal. Judgment and thought content normal.     EKG MILD EKG CHANGE, ? OLD INFARCTION     Assessment & Plan:  1.  3 month F/U for HTN, Cholesterol, Pre-Dm, D. Deficient, Anemia. Needs healthy diet, cardio QD and obtain healthy weight. Check Labs, Check BP if >130/80 call office 2. EKG change/  Obese/ Palpitations/ SOB- ref Cardio for further evaluation before consider wt loss RX. 3. Urine frequency- check labs, push H2O Hygiene explained 4. 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

## 2013-08-26 NOTE — Patient Instructions (Signed)
Exercise to Lose Weight Exercise and a healthy diet may help you lose weight. Your doctor may suggest specific exercises. EXERCISE IDEAS AND TIPS  Choose low-cost things you enjoy doing, such as walking, bicycling, or exercising to workout videos.  Take stairs instead of the elevator.  Walk during your lunch break.  Park your car further away from work or school.  Go to a gym or an exercise class.  Start with 5 to 10 minutes of exercise each day. Build up to 30 minutes of exercise 4 to 6 days a week.  Wear shoes with good support and comfortable clothes.  Stretch before and after working out.  Work out until you breathe harder and your heart beats faster.  Drink extra water when you exercise.  Do not do so much that you hurt yourself, feel dizzy, or get very short of breath. Exercises that burn about 150 calories:  Running 1  miles in 15 minutes.  Playing volleyball for 45 to 60 minutes.  Washing and waxing a car for 45 to 60 minutes.  Playing touch football for 45 minutes.  Walking 1  miles in 35 minutes.  Pushing a stroller 1  miles in 30 minutes.  Playing basketball for 30 minutes.  Raking leaves for 30 minutes.  Bicycling 5 miles in 30 minutes.  Walking 2 miles in 30 minutes.  Dancing for 30 minutes.  Shoveling snow for 15 minutes.  Swimming laps for 20 minutes.  Walking up stairs for 15 minutes.  Bicycling 4 miles in 15 minutes.  Gardening for 30 to 45 minutes.  Jumping rope for 15 minutes.  Washing windows or floors for 45 to 60 minutes. Document Released: 09/01/2010 Document Revised: 10/22/2011 Document Reviewed: 09/01/2010 [facility] Patient Information 2014 [place], [place]. Herpes Simplex Herpes simplex is generally classified as Type 1 or Type 2. Type 1 is generally the type that is responsible for cold sores. Type 2 is generally associated with sexually transmitted diseases. We now know that most of the thoughts on these viruses are  inaccurate. We find that HSV1 is also present genitally and HSV2 can be present orally, but this will vary in different locations of the world. Herpes simplex is usually detected by doing a culture. Blood tests are also available for this virus; however, the accuracy is often not as good.  PREPARATION FOR TEST No preparation or fasting is necessary. NORMAL FINDINGS  No virus present  No HSV antigens or antibodies present Ranges for normal findings may vary among different laboratories and hospitals. You should always check with your doctor after having lab work or other tests done to discuss the meaning of your test results and whether your values are considered within normal limits. MEANING OF TEST  Your caregiver will go over the test results with you and discuss the importance and meaning of your results, as well as treatment options and the need for additional tests if necessary. OBTAINING THE TEST RESULTS  It is your responsibility to obtain your test results. Ask the lab or department performing the test when and how you will get your results. Document Released: 09/01/2004 Document Revised: 10/22/2011 Document Reviewed: 07/10/2008 [facility] Patient Information 2014 [place], [place].

## 2013-08-27 LAB — URINALYSIS, ROUTINE W REFLEX MICROSCOPIC
Bilirubin Urine: NEGATIVE
Glucose, UA: NEGATIVE mg/dL
Hgb urine dipstick: NEGATIVE
Ketones, ur: NEGATIVE mg/dL
Nitrite: NEGATIVE
PROTEIN: NEGATIVE mg/dL
Urobilinogen, UA: 0.2 mg/dL (ref 0.0–1.0)
pH: 7.5 (ref 5.0–8.0)

## 2013-08-27 LAB — VITAMIN D 25 HYDROXY (VIT D DEFICIENCY, FRACTURES): Vit D, 25-Hydroxy: 40 ng/mL (ref 30–89)

## 2013-08-27 LAB — URINALYSIS, MICROSCOPIC ONLY
Bacteria, UA: NONE SEEN
Casts: NONE SEEN
Crystals: NONE SEEN

## 2013-08-27 LAB — HSV(HERPES SIMPLEX VRS) I + II AB-IGG: HSV 2 Glycoprotein G Ab, IgG: 12.73 IV — ABNORMAL HIGH

## 2013-08-27 LAB — VITAMIN B12: VITAMIN B 12: 342 pg/mL (ref 211–911)

## 2013-08-27 LAB — URINE CULTURE
Colony Count: NO GROWTH
Organism ID, Bacteria: NO GROWTH

## 2013-08-27 LAB — TSH: TSH: 5.474 u[IU]/mL — AB (ref 0.350–4.500)

## 2013-08-27 LAB — INSULIN, FASTING: Insulin fasting, serum: 13 u[IU]/mL (ref 3–28)

## 2013-08-28 ENCOUNTER — Other Ambulatory Visit: Payer: Self-pay | Admitting: Emergency Medicine

## 2013-08-28 MED ORDER — FAMCICLOVIR 500 MG PO TABS: 500.0000 mg | ORAL_TABLET | Freq: Every day | ORAL | Status: DC

## 2013-09-01 ENCOUNTER — Other Ambulatory Visit: Payer: Self-pay | Admitting: Internal Medicine

## 2013-09-02 NOTE — Progress Notes (Signed)
SCHEDULED 10-06-13 FOR LAB ONLY

## 2013-09-11 ENCOUNTER — Ambulatory Visit (INDEPENDENT_AMBULATORY_CARE_PROVIDER_SITE_OTHER): Payer: 59 | Admitting: Cardiovascular Disease

## 2013-09-11 VITALS — BP 128/90 | HR 98 | Ht 65.5 in | Wt 218.1 lb

## 2013-09-11 DIAGNOSIS — E079 Disorder of thyroid, unspecified: Secondary | ICD-10-CM

## 2013-09-11 DIAGNOSIS — E669 Obesity, unspecified: Secondary | ICD-10-CM

## 2013-09-11 DIAGNOSIS — R002 Palpitations: Secondary | ICD-10-CM

## 2013-09-11 DIAGNOSIS — I10 Essential (primary) hypertension: Secondary | ICD-10-CM

## 2013-09-11 DIAGNOSIS — E039 Hypothyroidism, unspecified: Secondary | ICD-10-CM

## 2013-09-11 DIAGNOSIS — E785 Hyperlipidemia, unspecified: Secondary | ICD-10-CM

## 2013-09-11 NOTE — Assessment & Plan Note (Signed)
Cholesterol is at goal.  Continue current dose of statin and diet Rx.  No myalgias or side effects.  F/U  LFT's in 6 months. Lab Results  Component Value Date   LDLCALC 94 08/26/2013

## 2013-09-11 NOTE — Progress Notes (Signed)
Patient ID: Sherry Dennis, female   DOB: 16-Sep-1960, 53 y.o.   MRN: [id number]   Overweight 53 yo referred by Kelby Aline.  History of palpitations.  Seen by Dr Doreatha Lew about 7 years ago with negative w/u and normal Echo.  Has struggled with weight for over 10 years. Took Phenyramine about 5 years ago but couldn't tolerate long due to palpitations getting worse.  Finally quit smoking in December but still vaping.  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  Portion control and not having breakfast an issue.  Thyroid levels very labile and she is frustrated by this When her dose is lowered she gains weight.  Saw Dr Buddy Duty at one time but didn't like him on 08/26/13 TSH was 5.4  No chest pain mild exertional dsypnea  Has access to exercise facilities but had knee surgery last year and arthritis in her feet which limits her.  LDL two weeks ago was 94     ROS: Denies fever, malais, weight loss, blurry vision, decreased visual acuity, cough, sputum, SOB, hemoptysis, pleuritic pain, palpitaitons, heartburn, abdominal pain, melena, lower extremity edema, claudication, or rash.  All other systems reviewed and negative   General: Affect appropriate Overweight white female with bronchitic voice  HEENT: normal Neck supple with no adenopathy JVP normal no bruits no thyromegaly Lungs clear with no wheezing and good diaphragmatic motion Heart:  S1/S2 no murmur,rub, gallop or click PMI normal Abdomen: benighn, BS positve, no tenderness, no AAA no bruit.  No HSM or HJR Distal pulses intact with no bruits No edema Neuro non-focal Skin warm and dry No muscular weakness  Medications Current Outpatient Prescriptions  Medication Sig Dispense Refill  . Cholecalciferol (VITAMIN D PO) Take 5,000 Int'l Units by mouth daily.        . Cyanocobalamin (VITAMIN B-12 PO) Take by mouth daily.      . famciclovir (FAMVIR) 500 MG tablet Take 1 tablet (500 mg total) by mouth daily.  90 tablet  3  . hydrochlorothiazide (HYDRODIURIL) 25 MG tablet Take 25 mg by mouth as needed.      Marland Kitchen levothyroxine (SYNTHROID, LEVOTHROID) 100 MCG tablet Take 100 mcg by mouth daily before breakfast. 1/2 of 100 QD      . meloxicam (MOBIC) 15 MG tablet TAKE ONE TABLET BY MOUTH EVERY DAY AFTER A MEAL FOR PAIN AND INFLAMMATION  90 tablet  1  . OVER THE COUNTER MEDICATION daily. Equate brand allergy Rx       No current facility-administered medications for this visit.    Allergies Darvocet; Darvon; and Tamiflu  Family History: Family History  Problem Relation Age of Onset  . Hypertension Mother   . Atrial fibrillation Mother   . Diabetes Father   . Emphysema Father     Social History: History   Social History  . Marital Status: Divorced    Spouse Name: N/A    Number of Children: N/A  . Years of Education: N/A   Occupational History  . Not on file.   Social History Main Topics  . Smoking status: Current Every Day Smoker -- 1.50 packs/day  . Smokeless tobacco: Not on file     Comment: Pt states d/c cigarretes and started using Vapors  . Alcohol Use: Yes     Comment: social occasionally  .  Drug Use: Not on file  . Sexual Activity: Not on file   Other Topics Concern  . Not on file   Social History Narrative  . No narrative on file    Electrocardiogram:  SR rate 61  Nonspecific T wave changes   Assessment and Plan

## 2013-09-11 NOTE — Patient Instructions (Addendum)
Your physician recommends that you schedule a follow-up appointment in:AS NEEDED You have been referred to ENDO DR  [facility] Your physician has requested that you have an echocardiogram. Echocardiography is a painless test that uses sound waves to create images of your heart. It provides your doctor with information about the size and shape of your heart and how well your heart's chambers and valves are working. This procedure takes approximately one hour. There are no restrictions for this procedure.

## 2013-09-11 NOTE — Assessment & Plan Note (Signed)
Continue diuretic and lifestyle measures  F/U primary

## 2013-09-11 NOTE — Assessment & Plan Note (Signed)
Told her there are no absolute contraindications to using diet pill However in my experience they all have eventually been associated with long term side effects like pulmonary hypertension , valve disease.  They all usually result in rebound weight gain when stopped and are usually prescribed for too long.  She is not overweight enough to suggest bariatric surgery.  She can certainly improve her diet, due low impact exercising and get her thyroid regulated  Will get echo to make sure valves EF and PA pressures are normal

## 2013-09-11 NOTE — Assessment & Plan Note (Signed)
Encouraged her to see another endocrinologist ? Use of armor thyroid.  Regulation important in regard to her weight and palpitations

## 2013-09-14 ENCOUNTER — Other Ambulatory Visit: Payer: Self-pay | Admitting: Emergency Medicine

## 2013-09-14 MED ORDER — VALACYCLOVIR HCL 500 MG PO TABS
500.0000 mg | ORAL_TABLET | Freq: Two times a day (BID) | ORAL | Status: AC
Start: 2013-09-14 — End: 2013-09-19

## 2013-09-17 ENCOUNTER — Emergency Department (HOSPITAL_COMMUNITY)
Admission: EM | Admit: 2013-09-17 | Discharge: 2013-09-17 | Disposition: A | Discharge status: Home / Self Care | Payer: 59 | Attending: Emergency Medicine | Admitting: Emergency Medicine

## 2013-09-17 ENCOUNTER — Encounter (HOSPITAL_COMMUNITY): Payer: Self-pay | Admitting: Emergency Medicine

## 2013-09-17 DIAGNOSIS — R002 Palpitations: Secondary | ICD-10-CM | POA: Insufficient documentation

## 2013-09-17 DIAGNOSIS — E559 Vitamin D deficiency, unspecified: Secondary | ICD-10-CM | POA: Insufficient documentation

## 2013-09-17 DIAGNOSIS — F17200 Nicotine dependence, unspecified, uncomplicated: Secondary | ICD-10-CM | POA: Insufficient documentation

## 2013-09-17 DIAGNOSIS — I471 Supraventricular tachycardia, unspecified: Secondary | ICD-10-CM | POA: Insufficient documentation

## 2013-09-17 DIAGNOSIS — R0789 Other chest pain: Secondary | ICD-10-CM | POA: Insufficient documentation

## 2013-09-17 DIAGNOSIS — E079 Disorder of thyroid, unspecified: Secondary | ICD-10-CM | POA: Insufficient documentation

## 2013-09-17 DIAGNOSIS — Z791 Long term (current) use of non-steroidal anti-inflammatories (NSAID): Secondary | ICD-10-CM | POA: Insufficient documentation

## 2013-09-17 DIAGNOSIS — Z79899 Other long term (current) drug therapy: Secondary | ICD-10-CM | POA: Insufficient documentation

## 2013-09-17 DIAGNOSIS — I10 Essential (primary) hypertension: Secondary | ICD-10-CM | POA: Insufficient documentation

## 2013-09-17 HISTORY — DX: Palpitations: R00.2

## 2013-09-17 LAB — CBC WITH DIFFERENTIAL/PLATELET
BASOS PCT: 0 % (ref 0–1)
Basophils Absolute: 0 10*3/uL (ref 0.0–0.1)
Eosinophils Absolute: 0.1 10*3/uL (ref 0.0–0.7)
Eosinophils Relative: 1 % (ref 0–5)
HCT: 38.7 % (ref 36.0–46.0)
HEMOGLOBIN: 13.7 g/dL (ref 12.0–15.0)
LYMPHS ABS: 2.9 10*3/uL (ref 0.7–4.0)
Lymphocytes Relative: 33 % (ref 12–46)
MCH: 33.4 pg (ref 26.0–34.0)
MCHC: 35.4 g/dL (ref 30.0–36.0)
MCV: 94.4 fL (ref 78.0–100.0)
MONOS PCT: 9 % (ref 3–12)
Monocytes Absolute: 0.8 10*3/uL (ref 0.1–1.0)
NEUTROS PCT: 57 % (ref 43–77)
Neutro Abs: 5 10*3/uL (ref 1.7–7.7)
Platelets: 254 10*3/uL (ref 150–400)
RBC: 4.1 MIL/uL (ref 3.87–5.11)
RDW: 12.4 % (ref 11.5–15.5)
WBC: 8.7 10*3/uL (ref 4.0–10.5)

## 2013-09-17 LAB — BASIC METABOLIC PANEL
BUN: 19 mg/dL (ref 6–23)
CHLORIDE: 103 meq/L (ref 96–112)
CO2: 24 mEq/L (ref 19–32)
Calcium: 9.7 mg/dL (ref 8.4–10.5)
Creatinine, Ser: 1.5 mg/dL — ABNORMAL HIGH (ref 0.50–1.10)
GFR calc non Af Amer: 40 mL/min — ABNORMAL LOW (ref >90)
GFR, EST AFRICAN AMERICAN: 46 mL/min — AB (ref >90)
Glucose, Bld: 109 mg/dL — ABNORMAL HIGH (ref 70–99)
POTASSIUM: 4 meq/L (ref 3.7–5.3)
Sodium: 142 mEq/L (ref 137–147)

## 2013-09-17 LAB — POCT I-STAT TROPONIN I: TROPONIN I, POC: 0 ng/mL (ref 0.00–0.08)

## 2013-09-17 MED ORDER — ADENOSINE 6 MG/2ML IV SOLN
INTRAVENOUS | Status: AC
  Administered 2013-09-17: 6 mg
  Filled 2013-09-17: qty 10

## 2013-09-17 MED ORDER — SODIUM CHLORIDE 0.9 % IV BOLUS (SEPSIS)
1000.0000 mL | Freq: Once | INTRAVENOUS | Status: AC
  Administered 2013-09-17: 1000 mL via INTRAVENOUS

## 2013-09-17 MED ORDER — METOPROLOL TARTRATE 25 MG PO TABS: 50.0000 mg | ORAL_TABLET | Freq: Two times a day (BID) | ORAL | Status: DC

## 2013-09-17 NOTE — ED Notes (Signed)
Pt began feeling chest tightness, palpitations and sob at 9 am.  Pt waiting to seen cardiologist.

## 2013-09-17 NOTE — Discharge Instructions (Signed)
Take metoprolol 25 mg twice a day to control your heart rate.   Follow up with your cardiologist. You should still get echo.   Your kidney function is slightly elevated. You should get it rechecked in a week.   Return to ER if you have chest pain, shortness of breath, palpitations.

## 2013-09-17 NOTE — ED Provider Notes (Signed)
CSN: [id number]     Arrival date & time 09/17/13  1309 History   First MD Initiated Contact with Patient 09/17/13 1332     Chief Complaint  Patient presents with  . svt    (Consider location/radiation/quality/duration/timing/severity/associated sxs/prior Treatment) The history is provided by the patient.  Sherry Dennis is a 53 y.o. female hx of HTN, HL, hypothyroidism here with palpitations and chest pain. Acute onset of palpitations this morning. Then felt some chest tightness. Denies any fever chills or cough. Of note she had intermittent palpitations for the last several months and has been seeing cardiologist for this. She is scheduled for an echo later this month.    Past Medical History  Diagnosis Date  . Hypertension   . Hyperlipidemia   . Thyroid disease   . Vitamin D deficiency   . Palpitations    Past Surgical History  Procedure Laterality Date  . Carpal tunnel release Right 1990  . Tubal ligation  1987  . Knee surgery Right 2013  . Bladder repair  1993  . Endometrial ablation  2009   Family History  Problem Relation Age of Onset  . Hypertension Mother   . Atrial fibrillation Mother   . Diabetes Father   . Emphysema Father    History  Substance Use Topics  . Smoking status: Current Every Day Smoker -- 1.50 packs/day  . Smokeless tobacco: Not on file     Comment: Pt states d/c cigarretes and started using Vapors  . Alcohol Use: Yes     Comment: social occasionally   OB History   Grav Para Term Preterm Abortions TAB SAB Ect Mult Living                 Review of Systems  Cardiovascular: Positive for chest pain and palpitations.  All other systems reviewed and are negative.    Allergies  Darvocet; Darvon; and Tamiflu  Home Medications   Current Outpatient Rx  Name  Route  Sig  Dispense  Refill  . Cholecalciferol (VITAMIN D3) 10000 UNITS capsule   Oral   Take 10,000 Units by mouth every evening.         . Cyanocobalamin (VITAMIN B-12 PO)   Oral   Take 1 tablet by mouth every evening.          . hydrochlorothiazide (HYDRODIURIL) 25 MG tablet   Oral   Take 25 mg by mouth daily.          Marland Kitchen levothyroxine (SYNTHROID, LEVOTHROID) 100 MCG tablet   Oral   Take 50-100 mcg by mouth every evening. 1/2 tablet on Tuesday, Thursday, and Saturday,  Whole tablet all other days         . meloxicam (MOBIC) 15 MG tablet   Oral   Take 15 mg by mouth daily.         Marland Kitchen OVER THE COUNTER MEDICATION   Oral   Take 1 tablet by mouth every morning. Equate brand allergy Rx         . valACYclovir (VALTREX) 500 MG tablet   Oral   Take 1 tablet (500 mg total) by mouth 2 (two) times daily.   60 tablet   3   . metoprolol (LOPRESSOR) 25 MG tablet   Oral   Take 2 tablets (50 mg total) by mouth 2 (two) times daily.   30 tablet   0    BP 102/71  Pulse 86  Temp(Src) 97.7 F (36.5 C) (Oral)  Resp 21  Ht 5' 5.5" (1.664 m)  Wt 219 lb (99.338 kg)  BMI 35.88 kg/m2  SpO2 96% Physical Exam  Nursing note and vitals reviewed. Constitutional: She is oriented to person, place, and time.  Uncomfortable   HENT:  Head: Normocephalic.  Mouth/Throat: Oropharynx is clear and moist.  Eyes: Conjunctivae are normal. Pupils are equal, round, and reactive to light.  Neck: Normal range of motion. Neck supple.  Cardiovascular: Regular rhythm and normal heart sounds.   Tachycardic   Pulmonary/Chest: Effort normal and breath sounds normal. No respiratory distress. She has no wheezes. She has no rales.  Abdominal: Soft. Bowel sounds are normal. She exhibits no distension. There is no tenderness. There is no rebound and no guarding.  Musculoskeletal: Normal range of motion. She exhibits no edema and no tenderness.  Neurological: She is alert and oriented to person, place, and time. No cranial nerve deficit. Coordination normal.  Skin: Skin is warm and dry.  Psychiatric: She has a normal mood and affect. Her behavior is normal. Judgment and thought content  normal.    ED Course  Procedures (including critical care time) Labs Review Labs Reviewed  BASIC METABOLIC PANEL - Abnormal; Notable for the following:    Glucose, Bld 109 (*)    Creatinine, Ser 1.50 (*)    GFR calc non Af Amer 40 (*)    GFR calc Af Amer 46 (*)    All other components within normal limits  CBC WITH DIFFERENTIAL  POCT I-STAT TROPONIN I   Imaging Review No results found.  EKG Interpretation    Date/Time:  Thursday September 17 2013 13:46:22 EST Ventricular Rate:  102 PR Interval:  142 QRS Duration: 104 QT Interval:  355 QTC Calculation: 462 R Axis:   -55 Text Interpretation:  Sinus tachycardia Ventricular premature complex Left anterior fascicular block Low voltage, precordial leads Consider anterior infarct SVT earlier in the day resolved  Confirmed by Arvon Schreiner  MD, Toneshia Coello [phone]) on 09/17/2013 1:50:09 PM            MDM   1. SVT (supraventricular tachycardia)   Sherry Dennis is a 53 y.o. female here with palpitations. EKG showed SVT rate 160s. I gave her adenosine 6 mg and she converted to sinus rhythm. Observed in the ED for 2 hrs. HR in the 80-90s. I called cardiology PA for Dr. Johnsie Cancel, who recommend metoprolol 25 mg BID, echo as scheduled, and f/u with Dr. Johnsie Cancel. Of note, her Cr was 1.5, higher than baseline. I think likely from the SVT causing renal insufficiency. Hydrated in the ED. Recommend recheck in a week.      Wandra Arthurs, MD 09/17/13 [phone]

## 2013-09-17 NOTE — ED Notes (Signed)
Patient is alert and orientedx4.  Patient was explained discharge instructions and they understood them with no questions.   

## 2013-09-23 ENCOUNTER — Encounter: Payer: Self-pay | Admitting: Emergency Medicine

## 2013-09-23 ENCOUNTER — Ambulatory Visit (INDEPENDENT_AMBULATORY_CARE_PROVIDER_SITE_OTHER): Payer: 59 | Admitting: Emergency Medicine

## 2013-09-23 VITALS — BP 128/92 | HR 96 | Temp 98.0°F | Resp 18 | Ht 66.0 in | Wt 220.0 lb

## 2013-09-23 DIAGNOSIS — E559 Vitamin D deficiency, unspecified: Secondary | ICD-10-CM

## 2013-09-23 DIAGNOSIS — E538 Deficiency of other specified B group vitamins: Secondary | ICD-10-CM

## 2013-09-23 DIAGNOSIS — E039 Hypothyroidism, unspecified: Secondary | ICD-10-CM

## 2013-09-23 DIAGNOSIS — M1990 Unspecified osteoarthritis, unspecified site: Secondary | ICD-10-CM

## 2013-09-23 DIAGNOSIS — M129 Arthropathy, unspecified: Secondary | ICD-10-CM

## 2013-09-23 DIAGNOSIS — R0602 Shortness of breath: Secondary | ICD-10-CM

## 2013-09-23 DIAGNOSIS — I10 Essential (primary) hypertension: Secondary | ICD-10-CM

## 2013-09-23 MED ORDER — IBUPROFEN 800 MG PO TABS: 800.0000 mg | ORAL_TABLET | Freq: Three times a day (TID) | ORAL | Status: DC | PRN

## 2013-09-23 NOTE — Progress Notes (Signed)
Subjective:    Patient ID: Sherry Dennis, female    DOB: Jul 03, 1961, 53 y.o.   MRN: [id number]  HPI Comments: 53 yo female 1 month f/u for TSH recheck with dose change. She notes mild improvement with energy with change.  She went to ER on 2/5 because pulse and heart was beating abnormally and she had SOB. They prescribed Metoprolol 25 which she had not started. She has upcoming echo on 10/01/13 with Dr Johnsie Cancel. CXR 9/13 WNL. She has been mildly SOB since d/c tobacco.   She has chronic arthritis pain in heels and knees. She denies any relief with Mobic. She notes Ibuprofen 800 mg helps better with pain. She has tried tylenol which takes edge off. She is not exercising routinely.   Hypertension Associated symptoms include palpitations and shortness of breath.    Current Outpatient Prescriptions on File Prior to Visit  Medication Sig Dispense Refill  . Cholecalciferol (VITAMIN D3) 10000 UNITS capsule Take 10,000 Units by mouth every evening.      . Cyanocobalamin (VITAMIN B-12 PO) Take 1 tablet by mouth every evening.       . hydrochlorothiazide (HYDRODIURIL) 25 MG tablet Take 25 mg by mouth daily.       Marland Kitchen levothyroxine (SYNTHROID, LEVOTHROID) 100 MCG tablet Take 50-100 mcg by mouth every evening. 1/2 tablet on Tuesday, Thursday, and Saturday,  Whole tablet all other days      . meloxicam (MOBIC) 15 MG tablet Take 15 mg by mouth daily.      Marland Kitchen OVER THE COUNTER MEDICATION Take 1 tablet by mouth every morning. Equate brand allergy Rx      . metoprolol (LOPRESSOR) 25 MG tablet Take 2 tablets (50 mg total) by mouth 2 (two) times daily.  30 tablet  0   No current facility-administered medications on file prior to visit.   Allergies  Allergen Reactions  . Darvocet [Propoxyphene N-Acetaminophen] Anaphylaxis  . Darvon [Propoxyphene Hcl] Anaphylaxis    Airway swelling   . Tamiflu [Oseltamivir Phosphate] Other (See Comments)    Increased blood pressure Hives    Past Medical History   Diagnosis Date  . Hypertension   . Hyperlipidemia   . Thyroid disease   . Vitamin D deficiency   . Palpitations      Review of Systems  Constitutional: Positive for fatigue.  Respiratory: Positive for shortness of breath.   Cardiovascular: Positive for palpitations.  Musculoskeletal: Positive for arthralgias.  All other systems reviewed and are negative.   BP 128/92  Pulse 96  Temp(Src) 98 F (36.7 C) (Temporal)  Resp 18  Ht 5\' 6"  (1.676 m)  Wt 220 lb (99.791 kg)  BMI 35.53 kg/m2     Objective:   Physical Exam  Nursing note and vitals reviewed. Constitutional: She is oriented to person, place, and time. She appears well-developed and well-nourished. No distress.  Over weight  HENT:  Head: Normocephalic and atraumatic.  Right Ear: External ear normal.  Left Ear: External ear normal.  Nose: Nose normal.  Mouth/Throat: Oropharynx is clear and moist.  Eyes: Conjunctivae and EOM are normal.  Neck: Normal range of motion. Neck supple. No JVD present. No thyromegaly present.  Cardiovascular: Normal rate, regular rhythm, normal heart sounds and intact distal pulses.   Pulmonary/Chest: Effort normal and breath sounds normal.  Abdominal: Soft. Bowel sounds are normal. She exhibits no distension and no mass. There is no tenderness. There is no rebound and no guarding.  Musculoskeletal: Normal range of motion. She  exhibits no edema and no tenderness.  Minimal crepitus bil knees  Lymphadenopathy:    She has no cervical adenopathy.  Neurological: She is alert and oriented to person, place, and time. No cranial nerve deficit.  Skin: Skin is warm and dry. No rash noted. No erythema. No pallor.  Psychiatric: She has a normal mood and affect. Her behavior is normal. Judgment and thought content normal.          Assessment & Plan:  1. HTN- Check BP call if >130/80, increase cardio, check labs 2. SOB with abnormal heart rate with recent ER Eval and Cardio appointment pending-  Check labs, Get CXR. Start Metoprolol 25 mg 1 qhs and slowly increased to BID, w/c if SX increase or ER. 3. Hypothyroid- Recheck labs with recent dose change, ? If any relationship to recent Heart issues. Get U/S to evaluate 4. Vit D def/Anemia hx- recheck labs 5. Arthritis pain- Refill Ibuprofen 800 mg try to only do QOD and alternate with Tylenol 650 mg QOD. Water exercise QD advised, check lab for gout.

## 2013-09-23 NOTE — Patient Instructions (Signed)
Arthritis, Nonspecific  Arthritis is pain, redness, warmth, or puffiness (inflammation) of a joint. The joint may be stiff or hurt when you move it. One or more joints may be affected. There are many types of arthritis. Your doctor may not know what type you have right away. The most common cause of arthritis is wear and tear on the joint (osteoarthritis).  HOME CARE   · Only take medicine as told by your doctor.  · Rest the joint as much as possible.  · Raise (elevate) your joint if it is puffy.  · Use crutches if the painful joint is in your leg.  · Drink enough fluids to keep your pee (urine) clear or pale yellow.  · Follow your doctor's diet instructions.  · Use cold packs for very bad joint pain for 10 to 15 minutes every hour. Ask your doctor if it is okay for you to use hot packs.  · Exercise as told by your doctor.  · Take a warm shower if you have stiffness in the morning.  · Move your sore joints throughout the day.  GET HELP RIGHT AWAY IF:   · You have a fever.  · You have very bad joint pain, puffiness, or redness.  · You have many joints that are painful and puffy.  · You are not getting better with treatment.  · You have very bad back pain or leg weakness.  · You cannot control when you poop (bowel movement) or pee (urinate).  · You do not feel better in 24 hours or are getting worse.  · You are having side effects from your medicine.  MAKE SURE YOU:   · Understand these instructions.  · Will watch your condition.  · Will get help right away if you are not doing well or get worse.  Document Released: 10/24/2009 Document Revised: 01/29/2012 Document Reviewed: 10/24/2009  [employer]® Patient Information ©2014 [employer].

## 2013-09-24 LAB — BASIC METABOLIC PANEL WITH GFR
BUN: 16 mg/dL (ref 6–23)
CHLORIDE: 101 meq/L (ref 96–112)
CO2: 27 mEq/L (ref 19–32)
Calcium: 9.8 mg/dL (ref 8.4–10.5)
Creat: 0.93 mg/dL (ref 0.50–1.10)
GFR, EST NON AFRICAN AMERICAN: 72 mL/min
GFR, Est African American: 83 mL/min
GLUCOSE: 91 mg/dL (ref 70–99)
POTASSIUM: 4 meq/L (ref 3.5–5.3)
SODIUM: 138 meq/L (ref 135–145)

## 2013-09-24 LAB — TSH: TSH: 0.186 u[IU]/mL — ABNORMAL LOW (ref 0.350–4.500)

## 2013-09-24 LAB — VITAMIN B12: VITAMIN B 12: 330 pg/mL (ref 211–911)

## 2013-09-24 LAB — D-DIMER, QUANTITATIVE (NOT AT ARMC): D DIMER QUANT: 0.39 ug{FEU}/mL (ref 0.00–0.48)

## 2013-09-24 LAB — BRAIN NATRIURETIC PEPTIDE: BRAIN NATRIURETIC PEPTIDE: 13.8 pg/mL (ref 0.0–100.0)

## 2013-09-24 LAB — URIC ACID: Uric Acid, Serum: 6 mg/dL (ref 2.4–7.0)

## 2013-09-24 LAB — VITAMIN D 25 HYDROXY (VIT D DEFICIENCY, FRACTURES): Vit D, 25-Hydroxy: 65 ng/mL (ref 30–89)

## 2013-09-25 ENCOUNTER — Ambulatory Visit
Admission: RE | Admit: 2013-09-25 | Discharge: 2013-09-25 | Disposition: A | Discharge status: Home / Self Care | Payer: 59 | Source: Ambulatory Visit | Attending: Emergency Medicine | Admitting: Emergency Medicine

## 2013-09-25 DIAGNOSIS — R0602 Shortness of breath: Secondary | ICD-10-CM

## 2013-09-25 DIAGNOSIS — E039 Hypothyroidism, unspecified: Secondary | ICD-10-CM

## 2013-10-01 ENCOUNTER — Ambulatory Visit (HOSPITAL_COMMUNITY): Payer: 59 | Attending: Internal Medicine | Admitting: Radiology

## 2013-10-01 ENCOUNTER — Encounter: Payer: Self-pay | Admitting: Internal Medicine

## 2013-10-01 DIAGNOSIS — E785 Hyperlipidemia, unspecified: Secondary | ICD-10-CM | POA: Insufficient documentation

## 2013-10-01 DIAGNOSIS — R002 Palpitations: Secondary | ICD-10-CM | POA: Insufficient documentation

## 2013-10-01 DIAGNOSIS — I10 Essential (primary) hypertension: Secondary | ICD-10-CM | POA: Insufficient documentation

## 2013-10-01 DIAGNOSIS — E669 Obesity, unspecified: Secondary | ICD-10-CM | POA: Insufficient documentation

## 2013-10-01 DIAGNOSIS — I079 Rheumatic tricuspid valve disease, unspecified: Secondary | ICD-10-CM | POA: Insufficient documentation

## 2013-10-01 NOTE — Progress Notes (Signed)
Echocardiogram performed.  

## 2013-10-07 ENCOUNTER — Other Ambulatory Visit: Payer: Self-pay

## 2013-10-17 ENCOUNTER — Encounter (HOSPITAL_COMMUNITY): Payer: Self-pay | Admitting: Emergency Medicine

## 2013-10-17 ENCOUNTER — Emergency Department (HOSPITAL_COMMUNITY)
Admission: EM | Admit: 2013-10-17 | Discharge: 2013-10-17 | Disposition: A | Discharge status: Home / Self Care | Payer: 59 | Source: Home / Self Care | Attending: Family Medicine | Admitting: Family Medicine

## 2013-10-17 DIAGNOSIS — J069 Acute upper respiratory infection, unspecified: Secondary | ICD-10-CM

## 2013-10-17 MED ORDER — IPRATROPIUM BROMIDE 0.06 % NA SOLN: 2.0000 | Freq: Four times a day (QID) | NASAL | Status: DC

## 2013-10-17 MED ORDER — HYDROCOD POLST-CHLORPHEN POLST 10-8 MG/5ML PO LQCR: 5.0000 mL | Freq: Two times a day (BID) | ORAL | Status: DC | PRN

## 2013-10-17 MED ORDER — CEFUROXIME AXETIL 250 MG PO TABS
250.0000 mg | ORAL_TABLET | Freq: Two times a day (BID) | ORAL | Status: DC
Start: 2013-10-17 — End: 2013-12-02

## 2013-10-17 NOTE — Discharge Instructions (Signed)
Take all of medicine, drink lots of fluids, no more smoking, see your doctor if further problems  °

## 2013-10-17 NOTE — ED Provider Notes (Signed)
CSN: [id number]     Arrival date & time 10/17/13  0902 History   First MD Initiated Contact with Patient 10/17/13 [phone]     Chief Complaint  Patient presents with  . URI   (Consider location/radiation/quality/duration/timing/severity/associated sxs/prior Treatment) Patient is a 53 y.o. female presenting with URI. The history is provided by the patient.  URI Presenting symptoms: congestion, cough and rhinorrhea   Presenting symptoms: no fever and no sore throat   Severity:  Mild Onset quality:  Sudden Duration:  2 days Progression:  Unchanged Chronicity:  New Risk factors: recent illness   Risk factors comment:  Recent pneumonia. is a smoker   Past Medical History  Diagnosis Date  . Hypertension   . Hyperlipidemia   . Thyroid disease   . Vitamin D deficiency   . Palpitations    Past Surgical History  Procedure Laterality Date  . Carpal tunnel release Right 1990  . Tubal ligation  1987  . Knee surgery Right 2013  . Bladder repair  1993  . Endometrial ablation  2009   Family History  Problem Relation Age of Onset  . Hypertension Mother   . Atrial fibrillation Mother   . Diabetes Father   . Emphysema Father    History  Substance Use Topics  . Smoking status: Former Smoker -- 1.50 packs/day    Quit date: 08/06/2013  . Smokeless tobacco: Not on file     Comment: Pt states d/c cigarretes and started using Vapors  . Alcohol Use: Yes     Comment: social occasionally   OB History   Grav Para Term Preterm Abortions TAB SAB Ect Mult Living                 Review of Systems  Constitutional: Negative.  Negative for fever.  HENT: Positive for congestion, postnasal drip and rhinorrhea. Negative for sore throat.   Respiratory: Positive for cough. Negative for shortness of breath.   Cardiovascular: Negative.   Gastrointestinal: Negative.     Allergies  Darvocet; Darvon; and Tamiflu  Home Medications   Current Outpatient Rx  Name  Route  Sig  Dispense  Refill  .  cefUROXime (CEFTIN) 250 MG tablet   Oral   Take 1 tablet (250 mg total) by mouth 2 (two) times daily.   20 tablet   0   . chlorpheniramine-HYDROcodone (TUSSIONEX PENNKINETIC ER) 10-8 MG/5ML LQCR   Oral   Take 5 mLs by mouth every 12 (twelve) hours as needed for cough.   115 mL   0   . Cholecalciferol (VITAMIN D3) 10000 UNITS capsule   Oral   Take 10,000 Units by mouth every evening.         . Cyanocobalamin (VITAMIN B-12 PO)   Oral   Take 1 tablet by mouth every evening.          . hydrochlorothiazide (HYDRODIURIL) 25 MG tablet   Oral   Take 25 mg by mouth daily.          Marland Kitchen ibuprofen (ADVIL,MOTRIN) 800 MG tablet   Oral   Take 1 tablet (800 mg total) by mouth every 8 (eight) hours as needed.   60 tablet   0   . ipratropium (ATROVENT) 0.06 % nasal spray   Each Nare   Place 2 sprays into both nostrils 4 (four) times daily.   15 mL   1   . levothyroxine (SYNTHROID, LEVOTHROID) 100 MCG tablet   Oral   Take 50-100 mcg by mouth  every evening. 1/2 tablet on Tuesday, Thursday, and Saturday,  Whole tablet all other days         . meloxicam (MOBIC) 15 MG tablet   Oral   Take 15 mg by mouth daily.         . metoprolol (LOPRESSOR) 25 MG tablet   Oral   Take 2 tablets (50 mg total) by mouth 2 (two) times daily.   30 tablet   0   . OVER THE COUNTER MEDICATION   Oral   Take 1 tablet by mouth every morning. Equate brand allergy Rx          BP 129/86  Pulse 89  Temp(Src) 98.1 F (36.7 C) (Oral)  Resp 18  SpO2 95% Physical Exam  Nursing note and vitals reviewed. Constitutional: She is oriented to person, place, and time. She appears well-developed and well-nourished.  HENT:  Head: Normocephalic.  Right Ear: External ear normal.  Left Ear: External ear normal.  Mouth/Throat: Oropharynx is clear and moist.  Eyes: Pupils are equal, round, and reactive to light.  Neck: Normal range of motion. Neck supple.  Cardiovascular: Regular rhythm and normal heart  sounds.   Pulmonary/Chest: Effort normal and breath sounds normal.  Lymphadenopathy:    She has no cervical adenopathy.  Neurological: She is alert and oriented to person, place, and time.  Skin: Skin is warm and dry.    ED Course  Procedures (including critical care time) Labs Review Labs Reviewed - No data to display Imaging Review No results found.   MDM   1. URI (upper respiratory infection)        Billy Fischer, MD 10/17/13 [phone]

## 2013-10-17 NOTE — ED Notes (Signed)
Pt  Reports  Symptoms  Of  Stuffy nose  Congested            With    Symptoms  X      1  Day               Pt  Sitting  Upright  On  Exam table   Speaking  In  Complete  sentances   Reports a  sorethroat  As  Well

## 2013-10-21 ENCOUNTER — Encounter: Payer: Self-pay | Admitting: Emergency Medicine

## 2013-10-21 ENCOUNTER — Encounter: Payer: Self-pay | Admitting: Internal Medicine

## 2013-10-21 DIAGNOSIS — Z0000 Encounter for general adult medical examination without abnormal findings: Secondary | ICD-10-CM

## 2013-11-04 ENCOUNTER — Encounter: Payer: Self-pay | Admitting: Physician Assistant

## 2013-11-04 ENCOUNTER — Ambulatory Visit (INDEPENDENT_AMBULATORY_CARE_PROVIDER_SITE_OTHER): Payer: 59 | Admitting: Physician Assistant

## 2013-11-04 VITALS — BP 130/80 | HR 76 | Temp 98.1°F | Resp 16 | Wt 226.0 lb

## 2013-11-04 DIAGNOSIS — R0989 Other specified symptoms and signs involving the circulatory and respiratory systems: Secondary | ICD-10-CM

## 2013-11-04 DIAGNOSIS — R6889 Other general symptoms and signs: Secondary | ICD-10-CM

## 2013-11-04 DIAGNOSIS — F17200 Nicotine dependence, unspecified, uncomplicated: Secondary | ICD-10-CM

## 2013-11-04 DIAGNOSIS — K219 Gastro-esophageal reflux disease without esophagitis: Secondary | ICD-10-CM

## 2013-11-04 DIAGNOSIS — J019 Acute sinusitis, unspecified: Secondary | ICD-10-CM

## 2013-11-04 MED ORDER — AZITHROMYCIN 250 MG PO TABS: ORAL_TABLET | ORAL | Status: DC

## 2013-11-04 MED ORDER — DEXLANSOPRAZOLE 60 MG PO CPDR: 60.0000 mg | DELAYED_RELEASE_CAPSULE | Freq: Every day | ORAL | Status: DC

## 2013-11-04 NOTE — Patient Instructions (Signed)
Smoking Cessation Quitting smoking is important to your health and has many advantages. However, it is not always easy to quit since nicotine is a very addictive drug. Often times, people try 3 times or more before being able to quit. This document explains the best ways for you to prepare to quit smoking. Quitting takes hard work and a lot of effort, but you can do it. ADVANTAGES OF QUITTING SMOKING  You will live longer, feel better, and live better.  Your body will feel the impact of quitting smoking almost immediately.  Within 20 minutes, blood pressure decreases. Your pulse returns to its normal level.  After 8 hours, carbon monoxide levels in the blood return to normal. Your oxygen level increases.  After 24 hours, the chance of having a heart attack starts to decrease. Your breath, hair, and body stop smelling like smoke.  After 48 hours, damaged nerve endings begin to recover. Your sense of taste and smell improve.  After 72 hours, the body is virtually free of nicotine. Your bronchial tubes relax and breathing becomes easier.  After 2 to 12 weeks, lungs can hold more air. Exercise becomes easier and circulation improves.  The risk of having a heart attack, stroke, cancer, or lung disease is greatly reduced.  After 1 year, the risk of coronary heart disease is cut in half.  After 5 years, the risk of stroke falls to the same as a nonsmoker.  After 10 years, the risk of lung cancer is cut in half and the risk of other cancers decreases significantly.  After 15 years, the risk of coronary heart disease drops, usually to the level of a nonsmoker.  If you are pregnant, quitting smoking will improve your chances of having a healthy baby.  The people you live with, especially any children, will be healthier.  You will have extra money to spend on things other than cigarettes. QUESTIONS TO THINK ABOUT BEFORE ATTEMPTING TO QUIT You may want to talk about your answers with your  caregiver.  Why do you want to quit?  If you tried to quit in the past, what helped and what did not?  What will be the most difficult situations for you after you quit? How will you plan to handle them?  Who can help you through the tough times? Your family? Friends? A caregiver?  What pleasures do you get from smoking? What ways can you still get pleasure if you quit? Here are some questions to ask your caregiver:  How can you help me to be successful at quitting?  What medicine do you think would be best for me and how should I take it?  What should I do if I need more help?  What is smoking withdrawal like? How can I get information on withdrawal? GET READY  Set a quit date.  Change your environment by getting rid of all cigarettes, ashtrays, matches, and lighters in your home, car, or work. Do not let people smoke in your home.  Review your past attempts to quit. Think about what worked and what did not. GET SUPPORT AND ENCOURAGEMENT You have a better chance of being successful if you have help. You can get support in many ways.  Tell your family, friends, and co-workers that you are going to quit and need their support. Ask them not to smoke around you.  Get individual, group, or telephone counseling and support. Programs are available at local hospitals and health centers. Call your local health department for   information about programs in your area.  Spiritual beliefs and practices may help some smokers quit.  Download a "quit meter" on your computer to keep track of quit statistics, such as how long you have gone without smoking, cigarettes not smoked, and money saved.  Get a self-help book about quitting smoking and staying off of tobacco. [place] yourself from urges to smoke. Talk to someone, go for a walk, or occupy your time with a task.  Change your normal routine. Take a different route to work. Drink tea instead of coffee.  Eat breakfast in a different place.  Reduce your stress. Take a hot bath, exercise, or read a book.  Plan something enjoyable to do every day. Reward yourself for not smoking.  Explore interactive web-based programs that specialize in helping you quit. GET MEDICINE AND USE IT CORRECTLY Medicines can help you stop smoking and decrease the urge to smoke. Combining medicine with the above behavioral methods and support can greatly increase your chances of successfully quitting smoking.  Nicotine replacement therapy helps deliver nicotine to your body without the negative effects and risks of smoking. Nicotine replacement therapy includes nicotine gum, lozenges, inhalers, nasal sprays, and skin patches. Some may be available over-the-counter and others require a prescription.  Antidepressant medicine helps people abstain from smoking, but how this works is unknown. This medicine is available by prescription.  Nicotinic receptor partial agonist medicine simulates the effect of nicotine in your brain. This medicine is available by prescription. Ask your caregiver for advice about which medicines to use and how to use them based on your health history. Your caregiver will tell you what side effects to look out for if you choose to be on a medicine or therapy. Carefully read the information on the package. Do not use any other product containing nicotine while using a nicotine replacement product.  RELAPSE OR DIFFICULT SITUATIONS Most relapses occur within the first 3 months after quitting. Do not be discouraged if you start smoking again. Remember, most people try several times before finally quitting. You may have symptoms of withdrawal because your body is used to nicotine. You may crave cigarettes, be irritable, feel very hungry, cough often, get headaches, or have difficulty concentrating. The withdrawal symptoms are only temporary. They are strongest when you first quit, but they will go away within  10 14 days. To reduce the chances of relapse, try to:  Avoid drinking alcohol. Drinking lowers your chances of successfully quitting.  Reduce the amount of caffeine you consume. Once you quit smoking, the amount of caffeine in your body increases and can give you symptoms, such as a rapid heartbeat, sweating, and anxiety.  Avoid smokers because they can make you want to smoke.  Do not let weight gain distract you. Many smokers will gain weight when they quit, usually less than 10 pounds. Eat a healthy diet and stay active. You can always lose the weight gained after you quit.  Find ways to improve your mood other than smoking. FOR MORE INFORMATION  www.smokefree.gov  Document Released: 07/24/2001 Document Revised: 01/29/2012 Document Reviewed: 11/08/2011 [facility] Patient Information 2014 [place], [place].  We are giving you chantix for smoking cessation. You can do it! And we are here to help! You may have heard some scary side effects about chantix, the three most common I hear about are nausea, crazy dreams and depression.  However, I like for my patients to try to stay on 1/2 a tablet twice  a day rather than one tablet twice a day as normally prescribed. This helps decrease the chances of side effects and helps save money by making a one month prescription last two months  Please start the prescription this way:  Start 1/2 tablet by mouth once daily after food with a full glass of water for 3 days Then do 1/2 tablet by mouth twice daily for 4 days.  At this point we have several options: 1) continue on 1/2 tablet twice a day- which I encourage you to do. You can stay on this dose the rest of the time on the medication or if you still feel the need to smoke you can do one of the two options below. 2) do one tablet in the morning and 1/2 in the evening which helps decrease dreams. 3) do one tablet twice a day.   What if I miss a dose? If you miss a dose, take it as soon as you can. If  it is almost time for your next dose, take only that dose. Do not take double or extra doses.  What should I watch for while using this medicine? Visit your doctor or health care professional for regular check ups. Ask for ongoing advice and encouragement from your doctor or healthcare professional, friends, and family to help you quit. If you smoke while on this medication, quit again  Your mouth may get dry. Chewing sugarless gum or hard candy, and drinking plenty of water may help. Contact your doctor if the problem does not go away or is severe.  You may get drowsy or dizzy. Do not drive, use machinery, or do anything that needs mental alertness until you know how this medicine affects you. Do not stand or sit up quickly, especially if you are an older patient.   The use of this medicine may increase the chance of suicidal thoughts or actions. Pay special attention to how you are responding while on this medicine. Any worsening of mood, or thoughts of suicide or dying should be reported to your health care professional right away.  ADVANTAGES OF QUITTING SMOKING  Within 20 minutes, blood pressure decreases. Your pulse is at normal level.  After 8 hours, carbon monoxide levels in the blood return to normal. Your oxygen level increases.  After 24 hours, the chance of having a heart attack starts to decrease. Your breath, hair, and body stop smelling like smoke.  After 48 hours, damaged nerve endings begin to recover. Your sense of taste and smell improve.  After 72 hours, the body is virtually free of nicotine. Your bronchial tubes relax and breathing becomes easier.  After 2 to 12 weeks, lungs can hold more air. Exercise becomes easier and circulation improves.  After 1 year, the risk of coronary heart disease is cut in half.  After 5 years, the risk of stroke falls to the same as a nonsmoker.  After 10 years, the risk of lung cancer is cut in half and the risk of other cancers  decreases significantly.  After 15 years, the risk of coronary heart disease drops, usually to the level of a nonsmoker.  You will have extra money to spend on things other than cigarettes.

## 2013-11-04 NOTE — Progress Notes (Signed)
   Subjective:    Patient ID: Sherry Dennis, female    DOB: [birth year], 53 y.o.   MRN: [id number]  Sore Throat  This is a new problem. Episode onset: 3 weeks. The problem has been unchanged (worse at night). There has been no fever. Associated symptoms include congestion, coughing, a hoarse voice, trouble swallowing and vomiting (vomited one time with nausea, GERD). Pertinent negatives include no abdominal pain, diarrhea, drooling, ear discharge, ear pain, headaches, plugged ear sensation, neck pain, shortness of breath, stridor or swollen glands. Associated symptoms comments: States it feels like there is something on the left side of her throat, gags when she lies down and better if she pushes on the left side of her throat or else she can feel it "move". Treatments tried: hot liquids helps, 3 weeks ago went to UC and got ceftin no help.    Review of Systems  Constitutional: Positive for appetite change. Negative for fever, activity change, fatigue and unexpected weight change.  HENT: Positive for congestion, hoarse voice, trouble swallowing and voice change. Negative for drooling, ear discharge, ear pain, nosebleeds, postnasal drip, rhinorrhea, sore throat and tinnitus.   Respiratory: Positive for cough and choking (on foods and liquids). Negative for chest tightness, shortness of breath and stridor.   Cardiovascular: Negative.   Gastrointestinal: Positive for vomiting (vomited one time with nausea, GERD). Negative for abdominal pain and diarrhea.  Genitourinary: Negative.   Musculoskeletal: Negative.  Negative for neck pain.  Neurological: Negative.  Negative for headaches.       Objective:   Physical Exam  Constitutional: She is oriented to person, place, and time. She appears well-developed and well-nourished.  HENT:  Head: Normocephalic and atraumatic.  Right Ear: External ear normal.  Left Ear: External ear normal.  Mouth/Throat: Oropharynx is clear and moist.  Eyes: Conjunctivae  and EOM are normal. Pupils are equal, round, and reactive to light.  Neck: Normal range of motion. Neck supple. No thyromegaly present.  Cardiovascular: Normal rate, regular rhythm and normal heart sounds.  Exam reveals no gallop and no friction rub.   No murmur heard. Pulmonary/Chest: Effort normal and breath sounds normal. No respiratory distress. She has no wheezes.  Abdominal: Soft. Bowel sounds are normal. She exhibits no distension and no mass. There is no tenderness. There is no rebound and no guarding.  Musculoskeletal: Normal range of motion.  Lymphadenopathy:    She has no cervical adenopathy.  Neurological: She is alert and oriented to person, place, and time. She displays normal reflexes. No cranial nerve deficit. Coordination normal.  Skin: Skin is warm and dry.  Psychiatric: She has a normal mood and affect.          Assessment & Plan:  ? Sinusitis, sleep apnea, GERD- will treat with dexilant samples, Zpak, but with her smoking history and a globulus sensation I will send her to ENT to rule out cancer.

## 2013-11-24 DIAGNOSIS — M1990 Unspecified osteoarthritis, unspecified site: Secondary | ICD-10-CM | POA: Insufficient documentation

## 2013-12-02 ENCOUNTER — Ambulatory Visit (INDEPENDENT_AMBULATORY_CARE_PROVIDER_SITE_OTHER): Payer: 59 | Admitting: Emergency Medicine

## 2013-12-02 ENCOUNTER — Encounter: Payer: Self-pay | Admitting: Emergency Medicine

## 2013-12-02 VITALS — BP 120/82 | HR 66 | Temp 98.4°F | Resp 18 | Ht 65.75 in | Wt 225.0 lb

## 2013-12-02 DIAGNOSIS — Z1211 Encounter for screening for malignant neoplasm of colon: Secondary | ICD-10-CM

## 2013-12-02 DIAGNOSIS — Z0000 Encounter for general adult medical examination without abnormal findings: Secondary | ICD-10-CM

## 2013-12-02 DIAGNOSIS — R7309 Other abnormal glucose: Secondary | ICD-10-CM

## 2013-12-02 DIAGNOSIS — J309 Allergic rhinitis, unspecified: Secondary | ICD-10-CM

## 2013-12-02 DIAGNOSIS — R5381 Other malaise: Secondary | ICD-10-CM

## 2013-12-02 DIAGNOSIS — R5383 Other fatigue: Secondary | ICD-10-CM

## 2013-12-02 DIAGNOSIS — E782 Mixed hyperlipidemia: Secondary | ICD-10-CM

## 2013-12-02 DIAGNOSIS — B351 Tinea unguium: Secondary | ICD-10-CM

## 2013-12-02 DIAGNOSIS — Z111 Encounter for screening for respiratory tuberculosis: Secondary | ICD-10-CM

## 2013-12-02 DIAGNOSIS — I10 Essential (primary) hypertension: Secondary | ICD-10-CM

## 2013-12-02 LAB — CBC WITH DIFFERENTIAL/PLATELET
BASOS PCT: 0 % (ref 0–1)
Basophils Absolute: 0 10*3/uL (ref 0.0–0.1)
Eosinophils Absolute: 0.1 10*3/uL (ref 0.0–0.7)
Eosinophils Relative: 1 % (ref 0–5)
HCT: 36.8 % (ref 36.0–46.0)
HEMOGLOBIN: 12.6 g/dL (ref 12.0–15.0)
LYMPHS PCT: 37 % (ref 12–46)
Lymphs Abs: 2.6 10*3/uL (ref 0.7–4.0)
MCH: 32.4 pg (ref 26.0–34.0)
MCHC: 34.2 g/dL (ref 30.0–36.0)
MCV: 94.6 fL (ref 78.0–100.0)
MONO ABS: 0.7 10*3/uL (ref 0.1–1.0)
MONOS PCT: 10 % (ref 3–12)
NEUTROS ABS: 3.6 10*3/uL (ref 1.7–7.7)
NEUTROS PCT: 52 % (ref 43–77)
Platelets: 209 10*3/uL (ref 150–400)
RBC: 3.89 MIL/uL (ref 3.87–5.11)
RDW: 13 % (ref 11.5–15.5)
WBC: 7 10*3/uL (ref 4.0–10.5)

## 2013-12-02 NOTE — Progress Notes (Signed)
Subjective:    Patient ID: Sherry Dennis, female    DOB: Jan 03, 1961, 53 y.o.   MRN: [id number]  HPI Comments: 53 YO overweight female CPE and presents for 3 month F/U for HTN, Cholesterol, Pre-Dm, D. deficient . She has mild stress with work but she has been slowly decreasing vapors. She is not exercising routinely due to left knee pain. She notes knee is improving after surgery and some rehab and will try to increase exercise. She is trying to lose weight and improve diet. Hr last abnormal labs A1c 6.1 Chol WNL   She changed thyroid dose AD at last OV with abnormal level but notes still with fatigue. She also notes + snoring and is not certain she wants to get a sleep study.   She notes she takes Dexilant when she needs it. She notes occasionally heart burn symptms. She controls thru diet and decreasing tobacco.  She has had increased allergy drainage over last couple of weeks with  Clear drainage. She has not tried any OTC routinely.        Medication List       This list is accurate as of: 12/02/13 11:59 PM.  Always use your most recent med list.               hydrochlorothiazide 25 MG tablet  Commonly known as:  HYDRODIURIL  Take 25 mg by mouth daily.     ibuprofen 800 MG tablet  Commonly known as:  ADVIL,MOTRIN  Take 1 tablet (800 mg total) by mouth every 8 (eight) hours as needed.     levothyroxine 100 MCG tablet  Commonly known as:  SYNTHROID, LEVOTHROID  Take 50-100 mcg by mouth every evening. 1/2 tablet on Tuesday, Thursday, and Saturday,  Whole tablet all other days     metoprolol tartrate 25 MG tablet  Commonly known as:  LOPRESSOR  Take 25 mg by mouth as needed.     OVER THE COUNTER MEDICATION  daily as needed. OTC Equate brand Acid Reducer     OVER THE COUNTER MEDICATION  Take 1 tablet by mouth every morning. Equate brand allergy Rx     VITAMIN B-12 PO  Take 1 tablet by mouth every evening.     Vitamin D3 10000 UNITS capsule  Take 10,000 Units by mouth  every evening.       Allergies  Allergen Reactions  . Darvocet [Propoxyphene N-Acetaminophen] Anaphylaxis  . Darvon [Propoxyphene Hcl] Anaphylaxis    Airway swelling   . Tamiflu [Oseltamivir Phosphate] Other (See Comments)    Increased blood pressure Hives    Past Medical History  Diagnosis Date  . Hypertension   . Hyperlipidemia   . Thyroid disease   . Vitamin D deficiency   . Palpitations    Past Surgical History  Procedure Laterality Date  . Carpal tunnel release Right 1990  . Tubal ligation  1987  . Knee surgery Right 2013  . Bladder repair  1993  . Endometrial ablation  2009  . Knee surgery Left 01/2014  . Skin surgery  2014    [facility]   History  Substance Use Topics  . Smoking status: Former Smoker -- 1.50 packs/day    Quit date: 08/06/2013  . Smokeless tobacco: Not on file     Comment: Pt states d/c cigarretes and started using Vapors  . Alcohol Use: Yes     Comment: social occasionally   Family History  Problem Relation Age of Onset  . Hypertension Mother   .  Atrial fibrillation Mother   . Diabetes Father   . Emphysema Father   . COPD Father   . Cancer Paternal Aunt     x 4 aunts, breast  . Stroke Maternal Grandmother   . Hyperlipidemia Maternal Grandfather   . Heart disease Maternal Grandfather   . Hyperlipidemia Paternal Grandmother   . Heart disease Paternal Grandmother   . Hyperlipidemia Paternal Grandfather   . Heart disease Paternal Grandfather    MAINTENANCE: Colonoscopy: >20 year ago wnl/ overdue EGD: 2011 Mammo: 05/07/12 WNL, 2014 PER PT WNL BMD: NO HX Pap/ Pelvic: GYN 2010? QQI:WLNLGX Dentist:q 6-12 month CXR:09/25/13  IMMUNIZATIONS: TD/Tdap: 2006 Pneumovax:04/08/12 Zostavax:N/A Influenza: ?  Patient Care Team: Unk Pinto, MD as PCP - General (Internal Medicine) Jari Pigg, MD as Consulting Physician (Dermatology) Lorn Junes, MD as Consulting Physician (Orthopedic Surgery) Claybon Jabs, MD as Consulting Physician  (Urology) Jeryl Columbia, MD as Consulting Physician (Gastroenterology) Gearlean Alf, MD as Consulting Physician (Orthopedic Surgery) [place], (GYN) Burnard Bunting, (Dentist) [facility]  Review of Systems  Constitutional: Positive for fatigue.  HENT: Positive for postnasal drip.   Musculoskeletal: Positive for arthralgias.  Psychiatric/Behavioral: Positive for sleep disturbance.  All other systems reviewed and are negative.  BP 120/82  Pulse 66  Temp(Src) 98.4 F (36.9 C) (Temporal)  Resp 18  Ht 5' 5.75" (1.67 m)  Wt 225 lb (102.059 kg)  BMI 36.59 kg/m2      Objective:   Physical Exam  Nursing note and vitals reviewed. Constitutional: She is oriented to person, place, and time. She appears well-developed and well-nourished. No distress.  Central obesity  HENT:  Head: Normocephalic and atraumatic.  Right Ear: External ear normal.  Left Ear: External ear normal.  Nose: Nose normal.  Mouth/Throat: Oropharynx is clear and moist. No oropharyngeal exudate.  Cloudy TM's bilaterally   Eyes: Conjunctivae and EOM are normal. Pupils are equal, round, and reactive to light. Right eye exhibits no discharge. Left eye exhibits no discharge. No scleral icterus.  Neck: Normal range of motion. Neck supple. No JVD present. No tracheal deviation present. No thyromegaly present.  Cardiovascular: Normal rate, regular rhythm, normal heart sounds and intact distal pulses.   Pulmonary/Chest: Effort normal and breath sounds normal.  Abdominal: Soft. Bowel sounds are normal. She exhibits no distension and no mass. There is no tenderness. There is no rebound and no guarding.  Genitourinary:  Def gyn  Musculoskeletal: Normal range of motion. She exhibits no edema and no tenderness.  Lymphadenopathy:    She has no cervical adenopathy.  Neurological: She is alert and oriented to person, place, and time. She has normal reflexes. No cranial nerve deficit. She exhibits normal muscle tone. Coordination normal.   Skin: Skin is warm and dry. No rash noted. No erythema. No pallor.  Great toe nails with thickening/ discoloration  Psychiatric: She has a normal mood and affect. Her behavior is normal. Judgment and thought content normal.      AORTA SCAN WNL EKG NSCSPT     Assessment & Plan:  1. CPE- Update screening labs/ History/ Immunizations/ Testing as needed. Advised healthy diet, QD exercise, increase H20 and continue RX/ Vitamins AD.  2. 3 month F/U for Obesity, HTN, Cholesterol, Pre-Dm, D. Deficient. Needs healthy diet, cardio QD and obtain healthy weight. Check Labs, Check BP if >130/80 call office  3. Allergic rhinitis- Allegra OTC, increase H2o, allergy hygiene explained.   4. Toe nail fungus-  Jublia SX AD, Hygiene explained

## 2013-12-02 NOTE — Patient Instructions (Addendum)
Allergic Rhinitis Allergic rhinitis is when the mucous membranes in the nose respond to allergens. Allergens are particles in the air that cause your body to have an allergic reaction. This causes you to release allergic antibodies. Through a chain of events, these eventually cause you to release histamine into the blood stream. Although meant to protect the body, it is this release of histamine that causes your discomfort, such as frequent sneezing, congestion, and an itchy, runny nose.  CAUSES  Seasonal allergic rhinitis (hay fever) is caused by pollen allergens that may come from grasses, trees, and weeds. Year-round allergic rhinitis (perennial allergic rhinitis) is caused by allergens such as house dust mites, pet dander, and mold spores.  SYMPTOMS   Nasal stuffiness (congestion).  Itchy, runny nose with sneezing and tearing of the eyes. DIAGNOSIS  Your health care provider can help you determine the allergen or allergens that trigger your symptoms. If you and your health care provider are unable to determine the allergen, skin or blood testing may be used. TREATMENT  Allergic Rhinitis does not have a cure, but it can be controlled by:  Medicines and allergy shots (immunotherapy).  Avoiding the allergen. Hay fever may often be treated with antihistamines in pill or nasal spray forms. Antihistamines block the effects of histamine. There are over-the-counter medicines that may help with nasal congestion and swelling around the eyes. Check with your health care provider before taking or giving this medicine.  If avoiding the allergen or the medicine prescribed do not work, there are many new medicines your health care provider can prescribe. Stronger medicine may be used if initial measures are ineffective. Desensitizing injections can be used if medicine and avoidance does not work. Desensitization is when a patient is given ongoing shots until the body becomes less sensitive to the allergen.  Make sure you follow up with your health care provider if problems continue. HOME CARE INSTRUCTIONS It is not possible to completely avoid allergens, but you can reduce your symptoms by taking steps to limit your exposure to them. It helps to know exactly what you are allergic to so that you can avoid your specific triggers. SEEK MEDICAL CARE IF:   You have a fever.  You develop a cough that does not stop easily (persistent).  You have shortness of breath.  You start wheezing.  Symptoms interfere with normal daily activities. Document Released: 04/24/2001 Document Revised: 05/20/2013 Document Reviewed: 04/06/2013 [facility] Patient Information 2014 [place]. We want weight loss that will last so you should lose 1-2 pounds a week.  THAT IS IT! Please pick THREE things a month to change. Once it is a habit check off the item. Then pick another three items off the list to become habits.  If you are already doing a habit on the list GREAT!  Cross that item off! o Don't drink your calories. Ie, alcohol, soda, fruit juice, and sweet tea.  o Drink more water. Drink a glass when you feel hungry or before each meal.  o Eat breakfast - Complex carb and protein (likeDannon light and fit yogurt, oatmeal, fruit, eggs, [place] bacon). o Measure your cereal.  Eat no more than one cup a day. (ie [place]) o Eat an apple a day. o Add a vegetable a day. o Try a new vegetable a month. o Use Pam! Stop using oil or butter to [place]. o Don't finish your plate or use smaller plates. o Share your dessert. o Eat sugar free Jello for dessert or frozen  grapes. o Don't eat 2-3 hours before bed. o Switch to whole wheat bread, pasta, and brown rice. o Make healthier choices when you eat out. No fries! o Pick baked chicken, NOT fried. o Don't forget to SLOW DOWN when you eat. It is not going anywhere.  o Take the stairs. o Park far away in the parking lot o [employer] (or weights) for 10 minutes while  watching TV. o Walk at work for 10 minutes during break. o Walk outside 1 time a week with your friend, kids, dog, or significant other. o Start a walking group at [place] the mall as much as you can tolerate.  o Keep a food diary. o Weigh yourself daily. o Walk for 15 minutes 3 days per week. o Swier at home more often and eat out less.  If life happens and you go back to old habits, it is okay.  Just start over. You can do it!   If you experience chest pain, get short of breath, or tired during the exercise, please stop immediately and inform your doctor.     Bad carbs also include fruit juice, alcohol, and sweet tea. These are empty calories that do not signal to your brain that you are full.   Please remember the good carbs are still carbs which convert into sugar. So please measure them out no more than 1/2-1 cup of rice, oatmeal, pasta, and beans.  Veggies are however free foods! Pile them on.   I like lean protein at every meal such as chicken, [place], pork chops, cottage cheese, etc. Just do not fry these meats and please center your meal around vegetable, the meats should be a side dish.   No all fruit is created equal. Please see the list below, the fruit at the bottom is higher in sugars than the fruit at the top   Preventing Toenail Fungus from Recurring   Sanitize your shoes with Mycomist spray or a similar shoe sanitizer spray.  Follow the instructions on the bottle and dry them outside in the sun or with a hairdryer.  We also recommend repeating the sanitization once weekly in shoes you wear most often.   Throw away any shoes you have worn a significant amount without socks-fungus thrives in a warm moist environment and you want to avoid re-infection after your laser procedure   Bleach your socks with regular or color safe bleach   Change your socks regularly to keep your feet clean and dry (especially if you have sweaty feet)-if sweaty feet are a problem, let  your doctor know-there is a great lotion that helps with this problem.   Clean your toenail clippers with alcohol before you use them if you do your own toenails and make sure to replace [place] boards and orange sticks regularly   If you get regular pedicures, bring your own instruments or go to a spa that sterilizes their instruments in an autoclave.

## 2013-12-03 LAB — URINALYSIS, ROUTINE W REFLEX MICROSCOPIC
Bilirubin Urine: NEGATIVE
Glucose, UA: NEGATIVE mg/dL
HGB URINE DIPSTICK: NEGATIVE
KETONES UR: NEGATIVE mg/dL
LEUKOCYTES UA: NEGATIVE
Nitrite: NEGATIVE
PH: 5 (ref 5.0–8.0)
PROTEIN: NEGATIVE mg/dL
Specific Gravity, Urine: 1.018 (ref 1.005–1.030)
Urobilinogen, UA: 0.2 mg/dL (ref 0.0–1.0)

## 2013-12-03 LAB — LIPID PANEL
CHOL/HDL RATIO: 3.8 ratio
CHOLESTEROL: 169 mg/dL (ref 0–200)
HDL: 44 mg/dL (ref >39)
LDL Cholesterol: 106 mg/dL — ABNORMAL HIGH (ref 0–99)
Triglycerides: 93 mg/dL (ref <150)
VLDL: 19 mg/dL (ref 0–40)

## 2013-12-03 LAB — HEPATIC FUNCTION PANEL
ALT: 17 U/L (ref 0–35)
AST: 16 U/L (ref 0–37)
Albumin: 4 g/dL (ref 3.5–5.2)
Alkaline Phosphatase: 67 U/L (ref 39–117)
BILIRUBIN TOTAL: 0.3 mg/dL (ref 0.2–1.2)
Bilirubin, Direct: 0.1 mg/dL (ref 0.0–0.3)
Indirect Bilirubin: 0.2 mg/dL (ref 0.2–1.2)
Total Protein: 6.3 g/dL (ref 6.0–8.3)

## 2013-12-03 LAB — BASIC METABOLIC PANEL WITH GFR
BUN: 12 mg/dL (ref 6–23)
CO2: 23 meq/L (ref 19–32)
Calcium: 8.9 mg/dL (ref 8.4–10.5)
Chloride: 108 mEq/L (ref 96–112)
Creat: 0.8 mg/dL (ref 0.50–1.10)
GFR, Est Non African American: 86 mL/min
Glucose, Bld: 93 mg/dL (ref 70–99)
POTASSIUM: 3.6 meq/L (ref 3.5–5.3)
Sodium: 139 mEq/L (ref 135–145)

## 2013-12-03 LAB — MICROALBUMIN / CREATININE URINE RATIO
CREATININE, URINE: 157.5 mg/dL
MICROALB/CREAT RATIO: 3.2 mg/g (ref 0.0–30.0)
Microalb, Ur: 0.5 mg/dL (ref 0.00–1.89)

## 2013-12-03 LAB — INSULIN, FASTING: Insulin fasting, serum: 7 u[IU]/mL (ref 3–28)

## 2013-12-03 LAB — MAGNESIUM: MAGNESIUM: 1.9 mg/dL (ref 1.5–2.5)

## 2013-12-03 LAB — VITAMIN D 25 HYDROXY (VIT D DEFICIENCY, FRACTURES): VIT D 25 HYDROXY: 55 ng/mL (ref 30–89)

## 2013-12-03 LAB — TSH: TSH: 2.442 u[IU]/mL (ref 0.350–4.500)

## 2013-12-03 LAB — HEMOGLOBIN A1C
Hgb A1c MFr Bld: 5.7 % — ABNORMAL HIGH (ref <5.7)
MEAN PLASMA GLUCOSE: 117 mg/dL — AB (ref <117)

## 2013-12-03 LAB — VITAMIN B12: Vitamin B-12: 276 pg/mL (ref 211–911)

## 2013-12-07 LAB — TB SKIN TEST
Induration: 0 mm
TB Skin Test: NEGATIVE

## 2013-12-08 ENCOUNTER — Encounter: Payer: Self-pay | Admitting: Emergency Medicine

## 2013-12-10 ENCOUNTER — Encounter: Payer: Self-pay | Admitting: Emergency Medicine

## 2013-12-10 ENCOUNTER — Ambulatory Visit (INDEPENDENT_AMBULATORY_CARE_PROVIDER_SITE_OTHER): Payer: 59 | Admitting: Emergency Medicine

## 2013-12-10 VITALS — BP 132/90 | HR 68 | Temp 98.2°F | Resp 18 | Ht 65.75 in | Wt 224.0 lb

## 2013-12-10 DIAGNOSIS — R350 Frequency of micturition: Secondary | ICD-10-CM

## 2013-12-10 DIAGNOSIS — R109 Unspecified abdominal pain: Secondary | ICD-10-CM

## 2013-12-10 MED ORDER — CIPROFLOXACIN HCL 500 MG PO TABS: 500.0000 mg | ORAL_TABLET | Freq: Two times a day (BID) | ORAL | Status: AC

## 2013-12-10 MED ORDER — ONDANSETRON 8 MG PO TBDP: 8.0000 mg | ORAL_TABLET | Freq: Three times a day (TID) | ORAL | Status: DC | PRN

## 2013-12-10 NOTE — Progress Notes (Signed)
   Subjective:    Patient ID: Sherry Dennis, female    DOB: [birth year], 53 y.o.   MRN: [id number]  HPI Comments: 53 yo female with increased urination and low back pain on right side x over 1 week. She has mild nausea. She denies kidney stone history. She denies hematuria. She denies strain. She notes pain is on/off and does not radiate. She notes intense pain when occurs.   Flank Pain      Review of Systems  Genitourinary: Positive for frequency and flank pain.  All other systems reviewed and are negative.  BP 132/90  Pulse 68  Temp(Src) 98.2 F (36.8 C) (Temporal)  Resp 18  Ht 5' 5.75" (1.67 m)  Wt 224 lb (101.606 kg)  BMI 36.43 kg/m2     Objective:   Physical Exam  Nursing note and vitals reviewed. Constitutional: She is oriented to person, place, and time. She appears well-developed and well-nourished.  HENT:  Head: Normocephalic and atraumatic.  Eyes: Conjunctivae are normal.  Neck: Normal range of motion.  Cardiovascular: Normal rate, regular rhythm, normal heart sounds and intact distal pulses.   Pulmonary/Chest: Effort normal and breath sounds normal.  Abdominal: Soft. Bowel sounds are normal. She exhibits no distension. There is no tenderness. There is no rebound and no guarding.  Musculoskeletal: Normal range of motion.  Neurological: She is alert and oriented to person, place, and time.  Skin: Skin is warm and dry.  Psychiatric: She has a normal mood and affect. Judgment normal.          Assessment & Plan:  Urine frequency/ flank pain- Cipro 500 mg AD, advised if symptoms increase ER with possibility of stone vs nephritis. Hygiene explained, check labs

## 2013-12-10 NOTE — Patient Instructions (Signed)
Urinary Tract Infection  Urinary tract infections (UTIs) can develop anywhere along your urinary tract. Your urinary tract is your body's drainage system for removing wastes and extra water. Your urinary tract includes two kidneys, two ureters, a bladder, and a urethra. Your kidneys are a pair of bean-shaped organs. Each kidney is about the size of your fist. They are located below your ribs, one on each side of your spine.  CAUSES  Infections are caused by microbes, which are microscopic organisms, including fungi, viruses, and bacteria. These organisms are so small that they can only be seen through a microscope. Bacteria are the microbes that most commonly cause UTIs.  SYMPTOMS   Symptoms of UTIs may vary by age and gender of the patient and by the location of the infection. Symptoms in young women typically include a frequent and intense urge to urinate and a painful, burning feeling in the bladder or urethra during urination. Older women and men are more likely to be tired, shaky, and weak and have muscle aches and abdominal pain. A fever may mean the infection is in your kidneys. Other symptoms of a kidney infection include pain in your back or sides below the ribs, nausea, and vomiting.  DIAGNOSIS  To diagnose a UTI, your caregiver will ask you about your symptoms. Your caregiver also will ask to provide a urine sample. The urine sample will be tested for bacteria and white blood cells. White blood cells are made by your body to help fight infection.  TREATMENT   Typically, UTIs can be treated with medication. Because most UTIs are caused by a bacterial infection, they usually can be treated with the use of antibiotics. The choice of antibiotic and length of treatment depend on your symptoms and the type of bacteria causing your infection.  HOME CARE INSTRUCTIONS   If you were prescribed antibiotics, take them exactly as your caregiver instructs you. Finish the medication even if you feel better after you  have only taken some of the medication.   Drink enough water and fluids to keep your urine clear or pale yellow.   Avoid caffeine, tea, and carbonated beverages. They tend to irritate your bladder.   Empty your bladder often. Avoid holding urine for long periods of time.   Empty your bladder before and after sexual intercourse.   After a bowel movement, women should cleanse from front to back. Use each tissue only once.  SEEK MEDICAL CARE IF:    You have back pain.   You develop a fever.   Your symptoms do not begin to resolve within 3 days.  SEEK IMMEDIATE MEDICAL CARE IF:    You have severe back pain or lower abdominal pain.   You develop chills.   You have nausea or vomiting.   You have continued burning or discomfort with urination.  MAKE SURE YOU:    Understand these instructions.   Will watch your condition.   Will get help right away if you are not doing well or get worse.  Document Released: 05/09/2005 Document Revised: 01/29/2012 Document Reviewed: 09/07/2011  [employer] Patient Information 2014 [employer].

## 2013-12-11 LAB — URINALYSIS, ROUTINE W REFLEX MICROSCOPIC
BILIRUBIN URINE: NEGATIVE
GLUCOSE, UA: NEGATIVE mg/dL
HGB URINE DIPSTICK: NEGATIVE
Ketones, ur: NEGATIVE mg/dL
Nitrite: NEGATIVE
PH: 6.5 (ref 5.0–8.0)
PROTEIN: NEGATIVE mg/dL
Specific Gravity, Urine: 1.005 (ref 1.005–1.030)
Urobilinogen, UA: 0.2 mg/dL (ref 0.0–1.0)

## 2013-12-11 LAB — URINALYSIS, MICROSCOPIC ONLY
Bacteria, UA: NONE SEEN
CASTS: NONE SEEN
Crystals: NONE SEEN
Squamous Epithelial / LPF: NONE SEEN

## 2013-12-11 LAB — URINE CULTURE
Colony Count: NO GROWTH
Organism ID, Bacteria: NO GROWTH

## 2013-12-16 ENCOUNTER — Other Ambulatory Visit: Payer: Self-pay | Admitting: Orthopedic Surgery

## 2013-12-16 DIAGNOSIS — M25561 Pain in right knee: Secondary | ICD-10-CM

## 2013-12-19 ENCOUNTER — Ambulatory Visit
Admission: RE | Admit: 2013-12-19 | Discharge: 2013-12-19 | Disposition: A | Discharge status: Home / Self Care | Payer: 59 | Source: Ambulatory Visit | Attending: Orthopedic Surgery | Admitting: Orthopedic Surgery

## 2013-12-19 DIAGNOSIS — M25561 Pain in right knee: Secondary | ICD-10-CM

## 2013-12-20 ENCOUNTER — Other Ambulatory Visit: Payer: 59

## 2013-12-22 ENCOUNTER — Other Ambulatory Visit: Payer: Self-pay | Admitting: Orthopedic Surgery

## 2014-01-19 ENCOUNTER — Ambulatory Visit: Payer: Self-pay | Admitting: Physician Assistant

## 2014-02-02 ENCOUNTER — Encounter (HOSPITAL_COMMUNITY): Payer: Self-pay | Admitting: Pharmacy Technician

## 2014-02-05 ENCOUNTER — Encounter (HOSPITAL_COMMUNITY): Payer: Self-pay

## 2014-02-05 ENCOUNTER — Encounter (HOSPITAL_COMMUNITY)
Admission: RE | Admit: 2014-02-05 | Discharge: 2014-02-05 | Disposition: A | Discharge status: Home / Self Care | Payer: 59 | Source: Ambulatory Visit | Attending: Orthopedic Surgery | Admitting: Orthopedic Surgery

## 2014-02-05 DIAGNOSIS — Z01812 Encounter for preprocedural laboratory examination: Secondary | ICD-10-CM | POA: Insufficient documentation

## 2014-02-05 HISTORY — DX: Cardiac arrhythmia, unspecified: I49.9

## 2014-02-05 HISTORY — DX: Nausea with vomiting, unspecified: Z98.890

## 2014-02-05 HISTORY — DX: Hypothyroidism, unspecified: E03.9

## 2014-02-05 HISTORY — DX: Nausea with vomiting, unspecified: R11.2

## 2014-02-05 HISTORY — DX: Pneumonia, unspecified organism: J18.9

## 2014-02-05 HISTORY — DX: Gastro-esophageal reflux disease without esophagitis: K21.9

## 2014-02-05 HISTORY — DX: Personal history of other diseases of the digestive system: Z87.19

## 2014-02-05 HISTORY — DX: Unspecified osteoarthritis, unspecified site: M19.90

## 2014-02-05 LAB — PROTIME-INR
INR: 1.01 (ref 0.00–1.49)
Prothrombin Time: 13.3 seconds (ref 11.6–15.2)

## 2014-02-05 LAB — COMPREHENSIVE METABOLIC PANEL
ALBUMIN: 3.7 g/dL (ref 3.5–5.2)
ALT: 20 U/L (ref 0–35)
AST: 20 U/L (ref 0–37)
Alkaline Phosphatase: 81 U/L (ref 39–117)
BUN: 18 mg/dL (ref 6–23)
CO2: 25 mEq/L (ref 19–32)
CREATININE: 1.06 mg/dL (ref 0.50–1.10)
Calcium: 9.5 mg/dL (ref 8.4–10.5)
Chloride: 101 mEq/L (ref 96–112)
GFR calc Af Amer: 69 mL/min — ABNORMAL LOW (ref >90)
GFR calc non Af Amer: 60 mL/min — ABNORMAL LOW (ref >90)
Glucose, Bld: 94 mg/dL (ref 70–99)
POTASSIUM: 4.1 meq/L (ref 3.7–5.3)
Sodium: 140 mEq/L (ref 137–147)
TOTAL PROTEIN: 6.7 g/dL (ref 6.0–8.3)
Total Bilirubin: 0.2 mg/dL — ABNORMAL LOW (ref 0.3–1.2)

## 2014-02-05 LAB — CBC WITH DIFFERENTIAL/PLATELET
BASOS PCT: 0 % (ref 0–1)
Basophils Absolute: 0 10*3/uL (ref 0.0–0.1)
EOS ABS: 0.1 10*3/uL (ref 0.0–0.7)
Eosinophils Relative: 1 % (ref 0–5)
HCT: 37.2 % (ref 36.0–46.0)
Hemoglobin: 12.4 g/dL (ref 12.0–15.0)
Lymphocytes Relative: 23 % (ref 12–46)
Lymphs Abs: 2.2 10*3/uL (ref 0.7–4.0)
MCH: 31.6 pg (ref 26.0–34.0)
MCHC: 33.3 g/dL (ref 30.0–36.0)
MCV: 94.9 fL (ref 78.0–100.0)
MONO ABS: 1 10*3/uL (ref 0.1–1.0)
Monocytes Relative: 10 % (ref 3–12)
NEUTROS PCT: 66 % (ref 43–77)
Neutro Abs: 6.3 10*3/uL (ref 1.7–7.7)
Platelets: 215 10*3/uL (ref 150–400)
RBC: 3.92 MIL/uL (ref 3.87–5.11)
RDW: 12.3 % (ref 11.5–15.5)
WBC: 9.5 10*3/uL (ref 4.0–10.5)

## 2014-02-05 LAB — SURGICAL PCR SCREEN
MRSA, PCR: NEGATIVE
STAPHYLOCOCCUS AUREUS: NEGATIVE

## 2014-02-05 LAB — APTT: aPTT: 26 seconds (ref 24–37)

## 2014-02-05 LAB — TYPE AND SCREEN
ABO/RH(D): O POS
Antibody Screen: NEGATIVE

## 2014-02-05 LAB — URINALYSIS, ROUTINE W REFLEX MICROSCOPIC
Bilirubin Urine: NEGATIVE
Glucose, UA: NEGATIVE mg/dL
Hgb urine dipstick: NEGATIVE
Ketones, ur: NEGATIVE mg/dL
LEUKOCYTES UA: NEGATIVE
Nitrite: NEGATIVE
PH: 5.5 (ref 5.0–8.0)
Protein, ur: NEGATIVE mg/dL
Specific Gravity, Urine: 1.026 (ref 1.005–1.030)
Urobilinogen, UA: 0.2 mg/dL (ref 0.0–1.0)

## 2014-02-05 LAB — ABO/RH: ABO/RH(D): O POS

## 2014-02-05 NOTE — Progress Notes (Signed)
Anesthesia PAT Evaluation:  Patient is a 53 year old female scheduled for right unicompartmental (medial) knee on 02/15/14 by Dr. Ronnie Derby.  History includes palpitations, SVT episode 09/2013 s/p adenosine, hypothyroidism, GERD, hiatal hernia, arthritis, skin cancer, HLD.  BMI is consistent with obesity.  PCP is Dr. Melford Aase, but typically see Kelby Aline, PA-C.  She saw cardiologist Dr. Johnsie Cancel 09/11/13 for palpitations.  He was also contacted by the ED physician when she was evaluated for SVT.  Dr. Johnsie Cancel recommended metoprolol and an echo (see below).  Patient states that following the echo, PRN cardiology follow-up was recommended, although I cannot confirm this.  She said her PCP felt she could only use her b-blocker PRN.  Patient denies any recurrent palpitations since her 09/2013 ED visit.  She denies SOB at rest, chest pain, new edema.  She has had intermittent palpitations lasting 2-4 hours for years. She went to the ED on 09/17/13 because the palpitations lasted all day.  Normally they would subside if she laid down.  She feels they are normally triggered by her eating too fast and its effect on her hiatal hernia.  She doesn't really notice a correlation with activity or caffeine.  She is a Radiation protection practitioner for the [employer]  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 She does have problems with regulating her thyroid levels, but most recently her TSH was WNL 11/2013.  She denies history of CVA, MI, CHF, syncope. On exam, she is a pleasant white female in NAD.  Heart RRR, I/VI SEM LSD.  Lungs clear.  Mild non-pitting pretibial edema.  No carotid bruits noted.  Mouth opening is somewhat small.   EKG on 12/02/13 showed: NSR, non-specific ST/T wave abnormality (most pronounced in inferior leads III and aVF. EKG appears stable when compared to 08/26/13 tracing.  Echo on 10/01/13 showed: - Left ventricle: The  cavity size was normal. Wall thickness was increased in a pattern of mild LVH. Systolic function was normal. The estimated ejection fraction was in the range of 55% to 60%. Doppler parameters are consistent with abnormal left ventricular relaxation (grade 1 diastolic dysfunction). The E/e' ratio is ~10, suggesting borderline elevated LV filling pressure. - Left atrium: The atrium was normal in size. - Inferior vena cava: The vessel was normal in size; the respirophasic diameter changes were in the normal range (= 50%); findings are consistent with normal central venous pressure. - Trivial pulmonic regurgitation. Mild tricuspid regurgitation.  CXR on 09/25/13 showed: No active cardiopulmonary disease.  Preoperative labs noted. Her urine culture is pending (I verified with labs that order was received.)   Patient with fairly unremarkable echo earlier this year. She denies any recurrent arrhythmias or CV symptoms. I discussed with anesthesiologist Dr. Glennon Mac.  Recommend she resume b-blocker therapy now in hope to prevent recurrent SVT pre- or perioperatively. She was initially started on 25 mg BID but reports her PCP decreased her dose to 25 mg 1/2 tab QD PRN, so I instructed her to start 25 mg 1/2 BID or contact her PCP or Dr. Johnsie Cancel for their input.  I told her to take a dose on the morning of surgery with a few sips of water.  If acute changes or recurrent arrhythmias then I would anticipate that she could proceed as planned.    Myra Gianotti, PA-C [facility] Short Stay Center/Anesthesiology Phone [phone] 02/05/2014 6:00 PM

## 2014-02-05 NOTE — Pre-Procedure Instructions (Addendum)
NORALYN KARIM  02/05/2014   Your procedure is scheduled on:  02/15/14  Report to [facility] cone short stay admitting at 915 AM.  Call this number if you have problems the morning of surgery: [phone]   Remember:   Do not eat food or drink liquids after midnight.   Take these medicines the morning of surgery with A SIP OF WATER: metoprolol, zofran if needed, acid reducer,      Take all meds as ordered until day of surgery except as instructed below or per dr     Bridgette Habermann all herbel meds, nsaids (aleve,naproxen,advil,ibuprofen) 5 days prior to surgery(02/11/14) including vitamins, aspirin,any herbal meds   Do not wear jewelry, make-up or nail polish.  Do not wear lotions, powders, or perfumes. You may wear deodorant.  Do not shave 48 hours prior to surgery. Men may shave face and neck.  Do not bring valuables to the hospital.  [facility] is not responsible                  for any belongings or valuables.               Contacts, dentures or bridgework may not be worn into surgery.  Leave suitcase in the car. After surgery it may be brought to your room.  For patients admitted to the hospital, discharge time is determined by your                treatment team.               Patients discharged the day of surgery will not be allowed to drive  home.  Name and phone number of your driver:   Special Instructions:  Special Instructions: [place] - Preparing for Surgery  Before surgery, you can play an important role.  Because skin is not sterile, your skin needs to be as free of germs as possible.  You can reduce the number of germs on you skin by washing with CHG (chlorahexidine gluconate) soap before surgery.  CHG is an antiseptic cleaner which kills germs and bonds with the skin to continue killing germs even after washing.  Please DO NOT use if you have an allergy to CHG or antibacterial soaps.  If your skin becomes reddened/irritated stop using the CHG and inform your nurse when you arrive at  Short Stay.  Do not shave (including legs and underarms) for at least 48 hours prior to the first CHG shower.  You may shave your face.  Please follow these instructions carefully:   1.  Shower with CHG Soap the night before surgery and the morning of Surgery.  2.  If you choose to wash your hair, wash your hair first as usual with your normal shampoo.  3.  After you shampoo, rinse your hair and body thoroughly to remove the Shampoo.  4.  Use CHG as you would any other liquid soap.  You can apply chg directly  to the skin and wash gently with scrungie or a clean washcloth.  5.  Apply the CHG Soap to your body ONLY FROM THE NECK DOWN.  Do not use on open wounds or open sores.  Avoid contact with your eyes ears, mouth and genitals (private parts).  Wash genitals (private parts)       with your normal soap.  6.  Wash thoroughly, paying special attention to the area where your surgery will be performed.  7.  Thoroughly rinse your body with warm  water from the neck down.  8.  DO NOT shower/wash with your normal soap after using and rinsing off the CHG Soap.  9.  Pat yourself dry with a clean towel.            10.  Wear clean pajamas.            11.  Place clean sheets on your bed the night of your first shower and do not sleep with pets.  Day of Surgery  Do not apply any lotions/deodorants the morning of surgery.  Please wear clean clothes to the hospital/surgery center.   Please read over the following fact sheets that you were given: Pain Booklet, Coughing and Deep Breathing, Blood Transfusion Information, Total Joint Packet, MRSA Information and Surgical Site Infection Prevention

## 2014-02-05 NOTE — Progress Notes (Signed)
02/05/14 1443  OBSTRUCTIVE SLEEP APNEA  Have you ever been diagnosed with sleep apnea through a sleep study? No  Do you snore loudly (loud enough to be heard through closed doors)?  0  Do you often feel tired, fatigued, or sleepy during the daytime? 1  Has anyone observed you stop breathing during your sleep? 0  Do you have, or are you being treated for high blood pressure? 1  BMI more than 35 kg/m2? 1  Age over 51 years old? 1  Neck circumference greater than 40 cm/16 inches? 0 (15.5)  Gender: 0  Obstructive Sleep Apnea Score 4  Score 4 or greater  Results sent to PCP

## 2014-02-06 LAB — URINE CULTURE: Colony Count: 75000

## 2014-02-14 MED ORDER — CEFAZOLIN SODIUM-DEXTROSE 2-3 GM-% IV SOLR
2.0000 g | INTRAVENOUS | Status: AC
  Administered 2014-02-15: 2 g via INTRAVENOUS
  Filled 2014-02-14: qty 50

## 2014-02-15 ENCOUNTER — Inpatient Hospital Stay (HOSPITAL_COMMUNITY): Payer: 59

## 2014-02-15 ENCOUNTER — Ambulatory Visit (HOSPITAL_COMMUNITY): Payer: 59 | Admitting: Certified Registered Nurse Anesthetist

## 2014-02-15 ENCOUNTER — Encounter (HOSPITAL_COMMUNITY): Payer: Self-pay | Admitting: Certified Registered Nurse Anesthetist

## 2014-02-15 ENCOUNTER — Inpatient Hospital Stay (HOSPITAL_COMMUNITY)
Admission: RE | Admit: 2014-02-15 | Discharge: 2014-02-16 | DRG: 470 | Disposition: A | Discharge status: Home Health | Payer: 59 | Source: Ambulatory Visit | Attending: Orthopedic Surgery | Admitting: Orthopedic Surgery

## 2014-02-15 ENCOUNTER — Encounter (HOSPITAL_COMMUNITY)
Admission: RE | Disposition: A | Discharge status: Home Health | Payer: Self-pay | Source: Ambulatory Visit | Attending: Orthopedic Surgery

## 2014-02-15 ENCOUNTER — Encounter (HOSPITAL_COMMUNITY): Payer: 59 | Admitting: Vascular Surgery

## 2014-02-15 DIAGNOSIS — Z8249 Family history of ischemic heart disease and other diseases of the circulatory system: Secondary | ICD-10-CM

## 2014-02-15 DIAGNOSIS — I10 Essential (primary) hypertension: Secondary | ICD-10-CM | POA: Diagnosis present

## 2014-02-15 DIAGNOSIS — Z96651 Presence of right artificial knee joint: Secondary | ICD-10-CM

## 2014-02-15 DIAGNOSIS — Z87891 Personal history of nicotine dependence: Secondary | ICD-10-CM

## 2014-02-15 DIAGNOSIS — E559 Vitamin D deficiency, unspecified: Secondary | ICD-10-CM | POA: Diagnosis present

## 2014-02-15 DIAGNOSIS — K219 Gastro-esophageal reflux disease without esophagitis: Secondary | ICD-10-CM | POA: Diagnosis present

## 2014-02-15 DIAGNOSIS — E039 Hypothyroidism, unspecified: Secondary | ICD-10-CM | POA: Diagnosis present

## 2014-02-15 DIAGNOSIS — Z888 Allergy status to other drugs, medicaments and biological substances status: Secondary | ICD-10-CM

## 2014-02-15 DIAGNOSIS — E669 Obesity, unspecified: Secondary | ICD-10-CM | POA: Diagnosis present

## 2014-02-15 DIAGNOSIS — Z9851 Tubal ligation status: Secondary | ICD-10-CM

## 2014-02-15 DIAGNOSIS — Z833 Family history of diabetes mellitus: Secondary | ICD-10-CM

## 2014-02-15 DIAGNOSIS — E785 Hyperlipidemia, unspecified: Secondary | ICD-10-CM | POA: Diagnosis present

## 2014-02-15 DIAGNOSIS — Z6837 Body mass index (BMI) 37.0-37.9, adult: Secondary | ICD-10-CM

## 2014-02-15 DIAGNOSIS — Z836 Family history of other diseases of the respiratory system: Secondary | ICD-10-CM

## 2014-02-15 DIAGNOSIS — Z96659 Presence of unspecified artificial knee joint: Secondary | ICD-10-CM

## 2014-02-15 DIAGNOSIS — M1710 Unilateral primary osteoarthritis, unspecified knee: Principal | ICD-10-CM | POA: Diagnosis present

## 2014-02-15 DIAGNOSIS — Z823 Family history of stroke: Secondary | ICD-10-CM

## 2014-02-15 DIAGNOSIS — D62 Acute posthemorrhagic anemia: Secondary | ICD-10-CM | POA: Diagnosis not present

## 2014-02-15 HISTORY — PX: PARTIAL KNEE ARTHROPLASTY: SHX2174

## 2014-02-15 HISTORY — DX: Presence of unspecified artificial knee joint: Z96.659

## 2014-02-15 LAB — CBC
HCT: 38.7 % (ref 36.0–46.0)
Hemoglobin: 12.7 g/dL (ref 12.0–15.0)
MCH: 31.4 pg (ref 26.0–34.0)
MCHC: 32.8 g/dL (ref 30.0–36.0)
MCV: 95.6 fL (ref 78.0–100.0)
PLATELETS: 211 10*3/uL (ref 150–400)
RBC: 4.05 MIL/uL (ref 3.87–5.11)
RDW: 12.7 % (ref 11.5–15.5)
WBC: 12.6 10*3/uL — ABNORMAL HIGH (ref 4.0–10.5)

## 2014-02-15 LAB — CREATININE, SERUM
CREATININE: 0.72 mg/dL (ref 0.50–1.10)
GFR calc Af Amer: >90 mL/min (ref >90)
GFR calc non Af Amer: >90 mL/min (ref >90)

## 2014-02-15 SURGERY — ARTHROPLASTY, KNEE, UNICOMPARTMENTAL
Anesthesia: General | Site: Knee | Laterality: Right

## 2014-02-15 MED ORDER — LEVOTHYROXINE SODIUM 100 MCG PO TABS
100.0000 ug | ORAL_TABLET | ORAL | Status: DC
  Administered 2014-02-15: 100 ug via ORAL
  Filled 2014-02-15: qty 1

## 2014-02-15 MED ORDER — DEXAMETHASONE SODIUM PHOSPHATE 4 MG/ML IJ SOLN
INTRAMUSCULAR | Status: AC
  Filled 2014-02-15: qty 2

## 2014-02-15 MED ORDER — BUPIVACAINE LIPOSOME 1.3 % IJ SUSP
INTRAMUSCULAR | Status: DC | PRN
  Administered 2014-02-15: 20 mL

## 2014-02-15 MED ORDER — FENTANYL CITRATE 0.05 MG/ML IJ SOLN
INTRAMUSCULAR | Status: AC
  Filled 2014-02-15: qty 2

## 2014-02-15 MED ORDER — FENTANYL CITRATE 0.05 MG/ML IJ SOLN
INTRAMUSCULAR | Status: DC | PRN
  Administered 2014-02-15: 50 ug via INTRAVENOUS
  Administered 2014-02-15: 100 ug via INTRAVENOUS

## 2014-02-15 MED ORDER — CHLORHEXIDINE GLUCONATE 4 % EX LIQD
60.0000 mL | Freq: Once | CUTANEOUS | Status: DC
  Filled 2014-02-15: qty 60

## 2014-02-15 MED ORDER — LEVOTHYROXINE SODIUM 50 MCG PO TABS
50.0000 ug | ORAL_TABLET | ORAL | Status: DC
  Filled 2014-02-15: qty 1

## 2014-02-15 MED ORDER — MEPERIDINE HCL 25 MG/ML IJ SOLN: 6.2500 mg | INTRAMUSCULAR | Status: DC | PRN

## 2014-02-15 MED ORDER — FLEET ENEMA 7-19 GM/118ML RE ENEM: 1.0000 | ENEMA | Freq: Once | RECTAL | Status: AC | PRN

## 2014-02-15 MED ORDER — TRANEXAMIC ACID 100 MG/ML IV SOLN
1000.0000 mg | INTRAVENOUS | Status: AC
  Administered 2014-02-15: 1000 mg via INTRAVENOUS
  Filled 2014-02-15: qty 10

## 2014-02-15 MED ORDER — HYDROMORPHONE HCL PF 1 MG/ML IJ SOLN: 1.0000 mg | INTRAMUSCULAR | Status: DC | PRN

## 2014-02-15 MED ORDER — MENTHOL 3 MG MT LOZG: 1.0000 | LOZENGE | OROMUCOSAL | Status: DC | PRN

## 2014-02-15 MED ORDER — METHOCARBAMOL 500 MG PO TABS
ORAL_TABLET | ORAL | Status: AC
  Filled 2014-02-15: qty 1

## 2014-02-15 MED ORDER — FENTANYL CITRATE 0.05 MG/ML IJ SOLN
100.0000 ug | Freq: Once | INTRAMUSCULAR | Status: AC
  Administered 2014-02-15: 100 ug via INTRAVENOUS

## 2014-02-15 MED ORDER — PROMETHAZINE HCL 25 MG/ML IJ SOLN: 6.2500 mg | INTRAMUSCULAR | Status: DC | PRN

## 2014-02-15 MED ORDER — FLUTICASONE PROPIONATE 50 MCG/ACT NA SUSP
1.0000 | Freq: Every day | NASAL | Status: DC
  Administered 2014-02-15 – 2014-02-16 (×2): 1 via NASAL
  Filled 2014-02-15: qty 16

## 2014-02-15 MED ORDER — DOCUSATE SODIUM 100 MG PO CAPS
100.0000 mg | ORAL_CAPSULE | Freq: Two times a day (BID) | ORAL | Status: DC
  Administered 2014-02-15 – 2014-02-16 (×2): 100 mg via ORAL
  Filled 2014-02-15 (×3): qty 1

## 2014-02-15 MED ORDER — BUPIVACAINE LIPOSOME 1.3 % IJ SUSP
20.0000 mL | Freq: Once | INTRAMUSCULAR | Status: DC
  Filled 2014-02-15: qty 20

## 2014-02-15 MED ORDER — MIDAZOLAM HCL 5 MG/ML IJ SOLN: 2.0000 mg | Freq: Once | INTRAMUSCULAR | Status: DC

## 2014-02-15 MED ORDER — SODIUM CHLORIDE 0.9 % IV SOLN
INTRAVENOUS | Status: DC
  Administered 2014-02-15: 19:00:00 via INTRAVENOUS
  Administered 2014-02-16: 75 mL/h via INTRAVENOUS

## 2014-02-15 MED ORDER — ENOXAPARIN SODIUM 30 MG/0.3ML ~~LOC~~ SOLN
30.0000 mg | Freq: Two times a day (BID) | SUBCUTANEOUS | Status: DC
  Administered 2014-02-16: 30 mg via SUBCUTANEOUS
  Filled 2014-02-15 (×3): qty 0.3

## 2014-02-15 MED ORDER — PHENOL 1.4 % MT LIQD: 1.0000 | OROMUCOSAL | Status: DC | PRN

## 2014-02-15 MED ORDER — ZOLPIDEM TARTRATE 5 MG PO TABS: 5.0000 mg | ORAL_TABLET | Freq: Every evening | ORAL | Status: DC | PRN

## 2014-02-15 MED ORDER — HYDRALAZINE HCL 20 MG/ML IJ SOLN
INTRAMUSCULAR | Status: AC
  Filled 2014-02-15: qty 1

## 2014-02-15 MED ORDER — OXYCODONE HCL 5 MG PO TABS
5.0000 mg | ORAL_TABLET | ORAL | Status: DC | PRN
  Administered 2014-02-16: 10 mg via ORAL
  Filled 2014-02-15: qty 2

## 2014-02-15 MED ORDER — GLYCOPYRROLATE 0.2 MG/ML IJ SOLN
INTRAMUSCULAR | Status: AC
  Filled 2014-02-15: qty 2

## 2014-02-15 MED ORDER — GLYCOPYRROLATE 0.2 MG/ML IJ SOLN
INTRAMUSCULAR | Status: DC | PRN
  Administered 2014-02-15: 0.2 mg via INTRAVENOUS
  Administered 2014-02-15: 0.4 mg via INTRAVENOUS

## 2014-02-15 MED ORDER — HYDROMORPHONE HCL PF 1 MG/ML IJ SOLN
INTRAMUSCULAR | Status: AC
  Filled 2014-02-15: qty 1

## 2014-02-15 MED ORDER — PROPOFOL 10 MG/ML IV BOLUS
INTRAVENOUS | Status: AC
  Filled 2014-02-15: qty 20

## 2014-02-15 MED ORDER — HYDROMORPHONE HCL PF 1 MG/ML IJ SOLN
0.2500 mg | INTRAMUSCULAR | Status: DC | PRN
  Administered 2014-02-15 (×2): 0.5 mg via INTRAVENOUS

## 2014-02-15 MED ORDER — DIPHENHYDRAMINE HCL 12.5 MG/5ML PO ELIX: 12.5000 mg | ORAL_SOLUTION | ORAL | Status: DC | PRN

## 2014-02-15 MED ORDER — ONDANSETRON HCL 4 MG/2ML IJ SOLN
INTRAMUSCULAR | Status: DC | PRN
  Administered 2014-02-15: 4 mg via INTRAVENOUS

## 2014-02-15 MED ORDER — MIDAZOLAM HCL 2 MG/2ML IJ SOLN
INTRAMUSCULAR | Status: AC
  Filled 2014-02-15: qty 2

## 2014-02-15 MED ORDER — POLYETHYLENE GLYCOL 3350 17 G PO PACK: 17.0000 g | PACK | Freq: Every day | ORAL | Status: DC | PRN

## 2014-02-15 MED ORDER — DEXAMETHASONE SODIUM PHOSPHATE 4 MG/ML IJ SOLN
INTRAMUSCULAR | Status: DC | PRN
  Administered 2014-02-15: 8 mg via INTRAVENOUS

## 2014-02-15 MED ORDER — HYDROCHLOROTHIAZIDE 25 MG PO TABS
25.0000 mg | ORAL_TABLET | Freq: Every day | ORAL | Status: DC | PRN
  Filled 2014-02-15: qty 1

## 2014-02-15 MED ORDER — METOCLOPRAMIDE HCL 5 MG/ML IJ SOLN
5.0000 mg | Freq: Three times a day (TID) | INTRAMUSCULAR | Status: DC | PRN
  Administered 2014-02-15: 10 mg via INTRAVENOUS
  Filled 2014-02-15: qty 2

## 2014-02-15 MED ORDER — BUPIVACAINE-EPINEPHRINE 0.5% -1:200000 IJ SOLN
INTRAMUSCULAR | Status: DC | PRN
  Administered 2014-02-15: 30 mL

## 2014-02-15 MED ORDER — OXYCODONE HCL ER 10 MG PO T12A
10.0000 mg | EXTENDED_RELEASE_TABLET | Freq: Two times a day (BID) | ORAL | Status: DC
  Administered 2014-02-15 – 2014-02-16 (×2): 10 mg via ORAL
  Filled 2014-02-15 (×2): qty 1

## 2014-02-15 MED ORDER — METOCLOPRAMIDE HCL 5 MG/ML IJ SOLN
INTRAMUSCULAR | Status: AC
  Filled 2014-02-15: qty 2

## 2014-02-15 MED ORDER — LACTATED RINGERS IV SOLN
INTRAVENOUS | Status: DC
  Administered 2014-02-15: 11:00:00 via INTRAVENOUS

## 2014-02-15 MED ORDER — ONDANSETRON HCL 4 MG/2ML IJ SOLN
4.0000 mg | Freq: Four times a day (QID) | INTRAMUSCULAR | Status: DC | PRN
  Administered 2014-02-15: 4 mg via INTRAVENOUS

## 2014-02-15 MED ORDER — MIDAZOLAM HCL 2 MG/2ML IJ SOLN
INTRAMUSCULAR | Status: AC
Start: 2014-02-15 — End: 2014-02-15
  Administered 2014-02-15: 2 mg
  Filled 2014-02-15: qty 2

## 2014-02-15 MED ORDER — MIDAZOLAM HCL 2 MG/2ML IJ SOLN: 0.5000 mg | Freq: Once | INTRAMUSCULAR | Status: DC | PRN

## 2014-02-15 MED ORDER — METOCLOPRAMIDE HCL 5 MG/ML IJ SOLN
INTRAMUSCULAR | Status: DC | PRN
  Administered 2014-02-15: 10 mg via INTRAVENOUS

## 2014-02-15 MED ORDER — LACTATED RINGERS IV SOLN
INTRAVENOUS | Status: DC | PRN
  Administered 2014-02-15: 11:00:00 via INTRAVENOUS

## 2014-02-15 MED ORDER — METOCLOPRAMIDE HCL 10 MG PO TABS
5.0000 mg | ORAL_TABLET | Freq: Three times a day (TID) | ORAL | Status: DC | PRN
Start: 2014-02-15 — End: 2014-02-16

## 2014-02-15 MED ORDER — ONDANSETRON HCL 4 MG/2ML IJ SOLN
INTRAMUSCULAR | Status: AC
  Filled 2014-02-15: qty 4

## 2014-02-15 MED ORDER — FENTANYL CITRATE 0.05 MG/ML IJ SOLN
INTRAMUSCULAR | Status: AC
  Filled 2014-02-15: qty 5

## 2014-02-15 MED ORDER — PROPOFOL 10 MG/ML IV BOLUS
INTRAVENOUS | Status: DC | PRN
  Administered 2014-02-15: 160 mg via INTRAVENOUS
  Administered 2014-02-15: 40 mg via INTRAVENOUS

## 2014-02-15 MED ORDER — NEOSTIGMINE METHYLSULFATE 10 MG/10ML IV SOLN
INTRAVENOUS | Status: DC | PRN
  Administered 2014-02-15: 3 mg via INTRAVENOUS
  Administered 2014-02-15: 1 mg via INTRAVENOUS

## 2014-02-15 MED ORDER — ACETAMINOPHEN 325 MG PO TABS: 650.0000 mg | ORAL_TABLET | Freq: Four times a day (QID) | ORAL | Status: DC | PRN

## 2014-02-15 MED ORDER — 0.9 % SODIUM CHLORIDE (POUR BTL) OPTIME
TOPICAL | Status: DC | PRN
  Administered 2014-02-15: 1000 mL

## 2014-02-15 MED ORDER — ARTIFICIAL TEARS OP OINT
TOPICAL_OINTMENT | OPHTHALMIC | Status: AC
  Filled 2014-02-15: qty 3.5

## 2014-02-15 MED ORDER — SCOPOLAMINE 1 MG/3DAYS TD PT72
1.0000 | MEDICATED_PATCH | TRANSDERMAL | Status: DC
  Administered 2014-02-15: 1.5 mg via TRANSDERMAL
  Filled 2014-02-15: qty 1

## 2014-02-15 MED ORDER — OXYCODONE HCL 5 MG PO TABS
ORAL_TABLET | ORAL | Status: AC
  Filled 2014-02-15: qty 1

## 2014-02-15 MED ORDER — ONDANSETRON HCL 4 MG/2ML IJ SOLN
INTRAMUSCULAR | Status: AC
  Filled 2014-02-15: qty 2

## 2014-02-15 MED ORDER — MIDAZOLAM HCL 5 MG/5ML IJ SOLN
INTRAMUSCULAR | Status: DC | PRN
  Administered 2014-02-15: 2 mg via INTRAVENOUS

## 2014-02-15 MED ORDER — CELECOXIB 200 MG PO CAPS
200.0000 mg | ORAL_CAPSULE | Freq: Two times a day (BID) | ORAL | Status: DC
  Administered 2014-02-15 – 2014-02-16 (×2): 200 mg via ORAL
  Filled 2014-02-15 (×3): qty 1

## 2014-02-15 MED ORDER — BISACODYL 10 MG RE SUPP: 10.0000 mg | Freq: Every day | RECTAL | Status: DC | PRN

## 2014-02-15 MED ORDER — OXYCODONE HCL 5 MG/5ML PO SOLN: 5.0000 mg | Freq: Once | ORAL | Status: AC | PRN

## 2014-02-15 MED ORDER — OXYCODONE HCL 5 MG PO TABS
5.0000 mg | ORAL_TABLET | Freq: Once | ORAL | Status: AC | PRN
  Administered 2014-02-15: 5 mg via ORAL

## 2014-02-15 MED ORDER — METHOCARBAMOL 500 MG PO TABS
500.0000 mg | ORAL_TABLET | Freq: Four times a day (QID) | ORAL | Status: DC | PRN
  Administered 2014-02-15 – 2014-02-16 (×3): 500 mg via ORAL
  Filled 2014-02-15 (×2): qty 1

## 2014-02-15 MED ORDER — LEVOTHYROXINE SODIUM 50 MCG PO TABS: 50.0000 ug | ORAL_TABLET | Freq: Every evening | ORAL | Status: DC

## 2014-02-15 MED ORDER — ROCURONIUM BROMIDE 100 MG/10ML IV SOLN
INTRAVENOUS | Status: DC | PRN
Start: 2014-02-15 — End: 2014-02-15
  Administered 2014-02-15: 50 mg via INTRAVENOUS

## 2014-02-15 MED ORDER — METOPROLOL TARTRATE 25 MG PO TABS
25.0000 mg | ORAL_TABLET | Freq: Every day | ORAL | Status: DC | PRN
  Filled 2014-02-15: qty 1

## 2014-02-15 MED ORDER — CEFAZOLIN SODIUM-DEXTROSE 2-3 GM-% IV SOLR
2.0000 g | Freq: Four times a day (QID) | INTRAVENOUS | Status: AC
  Administered 2014-02-15 – 2014-02-16 (×2): 2 g via INTRAVENOUS
  Filled 2014-02-15 (×2): qty 50

## 2014-02-15 MED ORDER — NEOSTIGMINE METHYLSULFATE 10 MG/10ML IV SOLN
INTRAVENOUS | Status: AC
  Filled 2014-02-15: qty 1

## 2014-02-15 MED ORDER — ONDANSETRON HCL 4 MG PO TABS: 4.0000 mg | ORAL_TABLET | Freq: Four times a day (QID) | ORAL | Status: DC | PRN

## 2014-02-15 MED ORDER — BUPIVACAINE-EPINEPHRINE (PF) 0.5% -1:200000 IJ SOLN
INTRAMUSCULAR | Status: DC | PRN
  Administered 2014-02-15: 30 mL via PERINEURAL

## 2014-02-15 MED ORDER — LIDOCAINE HCL (CARDIAC) 10 MG/ML IV SOLN
INTRAVENOUS | Status: DC | PRN
  Administered 2014-02-15: 80 mg via INTRAVENOUS

## 2014-02-15 MED ORDER — BUPIVACAINE-EPINEPHRINE (PF) 0.5% -1:200000 IJ SOLN
INTRAMUSCULAR | Status: AC
  Filled 2014-02-15: qty 30

## 2014-02-15 MED ORDER — ACETAMINOPHEN 650 MG RE SUPP: 650.0000 mg | Freq: Four times a day (QID) | RECTAL | Status: DC | PRN

## 2014-02-15 MED ORDER — METHOCARBAMOL 1000 MG/10ML IJ SOLN
500.0000 mg | Freq: Four times a day (QID) | INTRAVENOUS | Status: DC | PRN
  Filled 2014-02-15: qty 5

## 2014-02-15 SURGICAL SUPPLY — 76 items
BANDAGE ESMARK 6X9 LF (GAUZE/BANDAGES/DRESSINGS) ×1 IMPLANT
BLADE SAW RECIP 87.9 MT (BLADE) ×2 IMPLANT
BLADE SAW SAG 90X13X1.27 (BLADE) ×1 IMPLANT
BLADE SAW SGTL 13X75X1.27 (BLADE) ×2 IMPLANT
BLADE SAW SGTL 83.5X18.5 (BLADE) ×2 IMPLANT
BLADE SURG 10 STRL SS (BLADE) ×1 IMPLANT
BNDG CMPR 9X6 STRL LF SNTH (GAUZE/BANDAGES/DRESSINGS) ×1
BNDG CMPR MED 10X6 ELC LF (GAUZE/BANDAGES/DRESSINGS) ×1
BNDG ELASTIC 6X10 VLCR STRL LF (GAUZE/BANDAGES/DRESSINGS) ×2 IMPLANT
BNDG ESMARK 6X9 LF (GAUZE/BANDAGES/DRESSINGS) ×2
BOWL SMART MIX CTS (DISPOSABLE) ×2 IMPLANT
CAP BONE MODELS KNEE (Capitated) ×1 IMPLANT
CAP CEM FLX UNI FM/TB/SUR HD ×1 IMPLANT
CEMENT BONE SIMPLEX SPEEDSET (Cement) ×2 IMPLANT
COVER BACK TABLE 24X17X13 BIG (DRAPES) IMPLANT
COVER SURGICAL LIGHT HANDLE (MISCELLANEOUS) ×3 IMPLANT
CUFF TOURNIQUET SINGLE 34IN LL (TOURNIQUET CUFF) ×2 IMPLANT
DRAPE C-ARM 42X72 X-RAY (DRAPES) ×2 IMPLANT
DRAPE EXTREMITY T 121X128X90 (DRAPE) ×2 IMPLANT
DRAPE INCISE IOBAN 66X45 STRL (DRAPES) ×5 IMPLANT
DRAPE PROXIMA HALF (DRAPES) ×2 IMPLANT
DRAPE U-SHAPE 47X51 STRL (DRAPES) ×2 IMPLANT
DRSG ADAPTIC 3X8 NADH LF (GAUZE/BANDAGES/DRESSINGS) ×2 IMPLANT
DRSG PAD ABDOMINAL 8X10 ST (GAUZE/BANDAGES/DRESSINGS) ×2 IMPLANT
DURAPREP 26ML APPLICATOR (WOUND CARE) ×4 IMPLANT
ELECT REM PT RETURN 9FT ADLT (ELECTROSURGICAL) ×2
ELECTRODE REM PT RTRN 9FT ADLT (ELECTROSURGICAL) ×1 IMPLANT
EVACUATOR 1/8 PVC DRAIN (DRAIN) ×2 IMPLANT
FLUID NSS /IRRIG 3000 ML XXX (IV SOLUTION) ×2 IMPLANT
GLOVE BIOGEL M 7.0 STRL (GLOVE) ×2 IMPLANT
GLOVE BIOGEL PI IND STRL 6.5 (GLOVE) IMPLANT
GLOVE BIOGEL PI IND STRL 7.5 (GLOVE) IMPLANT
GLOVE BIOGEL PI IND STRL 8.5 (GLOVE) ×2 IMPLANT
GLOVE BIOGEL PI INDICATOR 6.5 (GLOVE) ×2
GLOVE BIOGEL PI INDICATOR 7.5 (GLOVE)
GLOVE BIOGEL PI INDICATOR 8.5 (GLOVE) ×2
GLOVE SURG ORTHO 8.0 STRL STRW (GLOVE) ×4 IMPLANT
GOWN STRL REUS W/ TWL LRG LVL3 (GOWN DISPOSABLE) ×2 IMPLANT
GOWN STRL REUS W/ TWL XL LVL3 (GOWN DISPOSABLE) ×2 IMPLANT
GOWN STRL REUS W/TWL LRG LVL3 (GOWN DISPOSABLE) ×4
GOWN STRL REUS W/TWL XL LVL3 (GOWN DISPOSABLE) ×4
HANDPIECE INTERPULSE COAX TIP (DISPOSABLE) ×2
HOOD PEEL AWAY FACE SHEILD DIS (HOOD) ×6 IMPLANT
KIT BASIN OR (CUSTOM PROCEDURE TRAY) ×2 IMPLANT
KIT ROOM TURNOVER OR (KITS) ×2 IMPLANT
MANIFOLD NEPTUNE II (INSTRUMENTS) ×2 IMPLANT
NDL HYPO 21X1 ECLIPSE (NEEDLE) ×1 IMPLANT
NEEDLE 21X1 OR PACK (NEEDLE) ×2 IMPLANT
NEEDLE 22X1 1/2 (OR ONLY) (NEEDLE) ×2 IMPLANT
NEEDLE HYPO 21X1 ECLIPSE (NEEDLE) IMPLANT
NS IRRIG 1000ML POUR BTL (IV SOLUTION) ×2 IMPLANT
PACK TOTAL JOINT (CUSTOM PROCEDURE TRAY) ×2 IMPLANT
PAD ARMBOARD 7.5X6 YLW CONV (MISCELLANEOUS) ×4 IMPLANT
PADDING CAST COTTON 6X4 STRL (CAST SUPPLIES) ×2 IMPLANT
SET HNDPC FAN SPRY TIP SCT (DISPOSABLE) ×1 IMPLANT
SPONGE GAUZE 4X4 12PLY (GAUZE/BANDAGES/DRESSINGS) ×2 IMPLANT
SPONGE GAUZE 4X4 12PLY STER LF (GAUZE/BANDAGES/DRESSINGS) ×1 IMPLANT
STAPLER VISISTAT 35W (STAPLE) ×2 IMPLANT
STRIP CLOSURE SKIN 1/2X4 (GAUZE/BANDAGES/DRESSINGS) ×1 IMPLANT
SUCTION FRAZIER TIP 10 FR DISP (SUCTIONS) ×2 IMPLANT
SUT MNCRL AB 4-0 PS2 18 (SUTURE) ×1 IMPLANT
SUT VIC AB 0 CT1 27 (SUTURE) ×4
SUT VIC AB 0 CT1 27XBRD ANBCTR (SUTURE) ×2 IMPLANT
SUT VIC AB 1 CT1 27 (SUTURE) ×2
SUT VIC AB 1 CT1 27XBRD ANBCTR (SUTURE) ×1 IMPLANT
SUT VIC AB 2-0 CT1 27 (SUTURE) ×2
SUT VIC AB 2-0 CT1 TAPERPNT 27 (SUTURE) ×1 IMPLANT
SYR 20CC LL (SYRINGE) ×2 IMPLANT
SYR 50ML LL SCALE MARK (SYRINGE) IMPLANT
SYR CONTROL 10ML LL (SYRINGE) ×2 IMPLANT
TOWEL OR 17X24 6PK STRL BLUE (TOWEL DISPOSABLE) ×2 IMPLANT
TOWEL OR 17X26 10 PK STRL BLUE (TOWEL DISPOSABLE) ×2 IMPLANT
WATER STERILE IRR 1000ML POUR (IV SOLUTION) ×6 IMPLANT
YANKAUER SUCT BULB TIP NO VENT (SUCTIONS) ×1 IMPLANT
ZIMMER PATIENT KNEE BONE MODELS ×1 IMPLANT
ZIMMER UNICOMPARTMENTAL HIGH FLEX KNEE ×1 IMPLANT

## 2014-02-15 NOTE — Anesthesia Preprocedure Evaluation (Addendum)
Anesthesia Evaluation  Patient identified by MRN, date of birth, ID band Patient awake    Reviewed: Allergy & Precautions, H&P , NPO status , Patient's Chart, lab work & pertinent test results, reviewed documented beta blocker date and time   History of Anesthesia Complications (+) PONV and history of anesthetic complications  Airway Mallampati: II TM Distance: >3 FB Neck ROM: Full    Dental  (+) Teeth Intact, Dental Advisory Given   Pulmonary former smoker,  breath sounds clear to auscultation  Pulmonary exam normal       Cardiovascular hypertension, Pt. on medications and Pt. on home beta blockers Rhythm:Regular Rate:Normal  2/15 ECHO: EF 55-60% with grade 1 diastolic dysfunction, valves OK   Neuro/Psych negative neurological ROS     GI/Hepatic Neg liver ROS, GERD-  Medicated and Controlled,  Endo/Other  Hypothyroidism Morbid obesity  Renal/GU negative Renal ROS     Musculoskeletal   Abdominal (+) + obese,   Peds  Hematology   Anesthesia Other Findings   Reproductive/Obstetrics                         Anesthesia Physical Anesthesia Plan  ASA: III  Anesthesia Plan: General   Post-op Pain Management:    Induction: Intravenous  Airway Management Planned: Oral ETT  Additional Equipment:   Intra-op Plan:   Post-operative Plan: Extubation in OR  Informed Consent: I have reviewed the patients History and Physical, chart, labs and discussed the procedure including the risks, benefits and alternatives for the proposed anesthesia with the patient or authorized representative who has indicated his/her understanding and acceptance.   Dental advisory given  Plan Discussed with: CRNA and Surgeon  Anesthesia Plan Comments: (Plan routine monitors, GETA with femoral nerve block for post op analgesia)        Anesthesia Quick Evaluation

## 2014-02-15 NOTE — Transfer of Care (Signed)
Immediate Anesthesia Transfer of Care Note  Patient: Sherry Dennis  Procedure(s) Performed: Procedure(s): RIGHT UNICOMPARTMENTAL KNEE (MEDIAL COMPARTMENT) (Right)  Patient Location: PACU  Anesthesia Type:General  Level of Consciousness: awake, alert , oriented and patient cooperative  Airway & Oxygen Therapy: Patient Spontanous Breathing and Patient connected to nasal cannula oxygen  Post-op Assessment: Report given to PACU RN, Post -op Vital signs reviewed and stable and Patient moving all extremities X 4  Post vital signs: Reviewed and stable  Complications: No apparent anesthesia complications

## 2014-02-15 NOTE — Anesthesia Procedure Notes (Signed)
Anesthesia Regional Block:  Femoral nerve block  Pre-Anesthetic Checklist: ,, timeout performed, Correct Patient, Correct Site, Correct Laterality, Correct Procedure, Correct Position, site marked, Risks and benefits discussed,  Surgical consent,  Pre-op evaluation,  At surgeon's request and post-op pain management  Laterality: Right and Lower  Prep: chloraprep       Needles:  Injection technique: Single-shot  Needle Type: Echogenic Stimulator Needle     Needle Length: 4cm 4 cm Needle Gauge: 22 and 22 G    Additional Needles:  Procedures: nerve stimulator Femoral nerve block  Nerve Stimulator or Paresthesia:  Response: patellla twitch, 0.4 mA, 0.1 ms,   Additional Responses:   Narrative:  Start time: 02/15/2014 11:08 AM End time: 02/15/2014 11:15 AM Injection made incrementally with aspirations every 5 mL.  Performed by: Personally  Anesthesiologist: Jenita Seashore, MD  Additional Notes: Pt identified in Holding room.  Monitors applied. Working IV access confirmed. Sterile prep R groin.  #22ga PNS to patella twitch at 0.39mA threshold.  30cc 0.5% Bupivacaine with 1:200k epi injected incrementally after negative test dose.  Patient asymptomatic, VSS, no heme aspirated, tolerated well.  Jenita Seashore, MD

## 2014-02-15 NOTE — H&P (Signed)
TAVON CORRIHER MRN:  [id number] DOB/SEX:  [birth year]/female  CHIEF COMPLAINT:  Painful right Knee  HISTORY: Patient is a 53 y.o. female presented with a history of pain in the right knee. Onset of symptoms was gradual starting several years ago with gradually worsening course since that time. Prior procedures on the knee include none. Patient has been treated conservatively with over-the-counter NSAIDs and activity modification. Patient currently rates pain in the knee at 8 out of 10 with activity. There is no pain at night.  PAST MEDICAL HISTORY: Patient Active Problem List   Diagnosis Date Noted  . Obesity 09/11/2013  . Hypertension   . Hyperlipidemia   . Thyroid disease   . Vitamin D deficiency    Past Medical History  Diagnosis Date  . Hypertension   . Hyperlipidemia   . Thyroid disease   . Vitamin D deficiency   . Palpitations   . PONV (postoperative nausea and vomiting)   . Pneumonia     hx  . Hypothyroidism   . UTI (lower urinary tract infection)   . GERD (gastroesophageal reflux disease)   . H/O hiatal hernia   . Arthritis   . Cancer     skin  . Dysrhythmia     palpitations occ; SVT 09/2013 s/p adenosine   Past Surgical History  Procedure Laterality Date  . Carpal tunnel release Right 1990  . Tubal ligation  1987  . Knee surgery Right 2013  . Bladder repair  1993    holes,  mesh 10  . Endometrial ablation  2009  . Knee surgery Left 01/2014  . Skin surgery  2014    [facility]     MEDICATIONS:   No prescriptions prior to admission    ALLERGIES:   Allergies  Allergen Reactions  . Darvocet [Propoxyphene N-Acetaminophen] Anaphylaxis  . Darvon [Propoxyphene Hcl] Anaphylaxis    Airway swelling   . Tamiflu [Oseltamivir Phosphate] Other (See Comments)    Increased blood pressure Hives     REVIEW OF SYSTEMS:  Pertinent items are noted in HPI.   FAMILY HISTORY:   Family History  Problem Relation Age of Onset  . Hypertension Mother   . Atrial fibrillation  Mother   . Diabetes Father   . Emphysema Father   . COPD Father   . Cancer Paternal Aunt     x 4 aunts, breast  . Stroke Maternal Grandmother   . Hyperlipidemia Maternal Grandfather   . Heart disease Maternal Grandfather   . Hyperlipidemia Paternal Grandmother   . Heart disease Paternal Grandmother   . Hyperlipidemia Paternal Grandfather   . Heart disease Paternal Grandfather     SOCIAL HISTORY:   History  Substance Use Topics  . Smoking status: Former Smoker -- 1.50 packs/day    Types: Cigarettes    Quit date: 08/06/2013  . Smokeless tobacco: Not on file     Comment: Pt states d/c cigarretes and started using Vapors  . Alcohol Use: Yes     Comment: social occasionally     EXAMINATION:  Vital signs in last 24 hours:    General appearance: alert, cooperative and no distress Lungs: clear to auscultation bilaterally Heart: regular rate and rhythm, S1, S2 normal, no murmur, click, rub or gallop Abdomen: soft, non-tender; bowel sounds normal; no masses,  no organomegaly Extremities: extremities normal, atraumatic, no cyanosis or edema and Homans sign is negative, no sign of DVT Pulses: 2+ and symmetric Skin: Skin color, texture, turgor normal. No rashes or lesions Neurologic:  Alert and oriented X 3, normal strength and tone. Normal symmetric reflexes. Normal coordination and gait  Musculoskeletal:  ROM 0-115, Ligaments intact,  Imaging Review Plain radiographs demonstrate severe degenerative joint disease of the right knee. The overall alignment is significant valgus. The bone quality appears to be good for age and reported activity level.  Assessment/Plan: End stage arthritis, right knee   The patient history, physical examination and imaging studies are consistent with advanced degenerative joint disease of the right knee. The patient has failed conservative treatment.  The clearance notes were reviewed.  After discussion with the patient it was felt that Uni Knee  Replacement was indicated. The procedure,  risks, and benefits of total knee arthroplasty were presented and reviewed. The risks including but not limited to aseptic loosening, infection, blood clots, vascular injury, stiffness, patella tracking problems complications among others were discussed. The patient acknowledged the explanation, agreed to proceed with the plan.  Dagoberto Nealy 02/15/2014, 6:57 AM

## 2014-02-15 NOTE — Evaluation (Signed)
Physical Therapy Evaluation Patient Details Name: Sherry Dennis MRN: [id number] DOB: [birth year] Today's Date: 02/15/2014   History of Present Illness  Patient is a 53 yo female admitted 02/15/14 now s/p Rt uni-compartment knee replacement.   PMH: HTN, dysrhythmia  Clinical Impression  Patient presents with problems listed below.  Will benefit from acute PT to maximize functional mobility prior to discharge to daughter's home.  Should progress well with PT.    Follow Up Recommendations Outpatient PT;Supervision/Assistance - 24 hour (Per patient, to have OP PT at discharge)    Equipment Recommendations  None recommended by PT    Recommendations for Other Services       Precautions / Restrictions Precautions Precautions: Knee Precaution Booklet Issued: Yes (comment) Precaution Comments: Reviewed precautions with patient Restrictions Weight Bearing Restrictions: Yes RLE Weight Bearing: Weight bearing as tolerated      Mobility  Bed Mobility Overal bed mobility: Needs Assistance Bed Mobility: Supine to Sit     Supine to sit: Min assist     General bed mobility comments: Verbal cues for technique.  Assist to move RLE off of bed.  Transfers Overall transfer level: Needs assistance Equipment used: Rolling walker (2 wheeled) Transfers: Sit to/from [facility] Sit to Stand: Min assist Stand pivot transfers: Min assist       General transfer comment: Verbal cues for hand placement and technique. Assist for balance/safety only.  Patient able to take several steps to pivot to chair.   Ambulation/Gait                Stairs            Wheelchair Mobility    Modified Rankin (Stroke Patients Only)       Balance                                             Pertinent Vitals/Pain Pain 3/10, however nausea 10/10.  RN provided medication.    Home Living Family/patient expects to be discharged to:: Private residence Living  Arrangements: Alone Available Help at Discharge: Family;Available 24 hours/day (Patient going to daughter's home) Type of Home: House Home Access: Stairs to enter Entrance Stairs-Rails: Right;Left Entrance Stairs-Number of Steps: 4 Home Layout: Two level;Able to live on main level with bedroom/bathroom Home Equipment: Gilford Rile - 2 wheels;Bedside commode      Prior Function Level of Independence: Independent               Hand Dominance        Extremity/Trunk Assessment   Upper Extremity Assessment: Overall WFL for tasks assessed           Lower Extremity Assessment: RLE deficits/detail RLE Deficits / Details: Decreased strength/ROM - post-op    Cervical / Trunk Assessment: Normal  Communication   Communication: No difficulties  Cognition Arousal/Alertness: Awake/alert Behavior During Therapy: WFL for tasks assessed/performed Overall Cognitive Status: Within Functional Limits for tasks assessed                      General Comments      Exercises Total Joint Exercises Ankle Circles/Pumps: AROM;Both;10 reps;Seated Quad Sets: AROM;Right;5 reps;Seated      Assessment/Plan    PT Assessment Patient needs continued PT services  PT Diagnosis Difficulty walking;Acute pain   PT Problem List Decreased strength;Decreased range of motion;Decreased activity tolerance;Decreased balance;Decreased mobility;Decreased knowledge  of use of DME;Decreased knowledge of precautions;Pain  PT Treatment Interventions DME instruction;Gait training;Stair training;Functional mobility training;Therapeutic exercise;Patient/family education   PT Goals (Current goals can be found in the Care Plan section) Acute Rehab PT Goals Patient Stated Goal: To go home tomorrow PT Goal Formulation: With patient Time For Goal Achievement: 02/22/14 Potential to Achieve Goals: Good    Frequency 7X/week   Barriers to discharge Decreased caregiver support Patient lives alone.  Will be going  to daughter's home at discharge with 24 hour assist.    Co-evaluation               End of Session Equipment Utilized During Treatment: Gait belt Activity Tolerance: Patient tolerated treatment well (Limited by nausea (10/10)) Patient left: in chair;with call bell/phone within reach;with family/visitor present Nurse Communication: Mobility status         Time: 5320-2334 PT Time Calculation (min): 18 min   Charges:   PT Evaluation $Initial PT Evaluation Tier I: 1 Procedure PT Treatments $Therapeutic Activity: 8-22 mins   PT G Codes:          Despina Pole 02/15/2014, 7:48 PM Carita Pian. Sanjuana Kava, [place] Pager [phone]

## 2014-02-15 NOTE — Progress Notes (Signed)
Orthopedic Tech Progress Note Patient Details:  Sherry Dennis 1961-07-20 [id number]  CPM Right Knee CPM Right Knee: On Right Knee Flexion (Degrees): 90 Right Knee Extension (Degrees): 0 Additional Comments: Trapeze bar and foot roll   Cammer, Theodoro Parma 02/15/2014, 3:19 PM

## 2014-02-15 NOTE — Anesthesia Postprocedure Evaluation (Signed)
Anesthesia Post Note  Patient: Sherry Dennis  Procedure(s) Performed: Procedure(s) (LRB): RIGHT UNICOMPARTMENTAL KNEE (MEDIAL COMPARTMENT) (Right)  Anesthesia type: General  Patient location: PACU  Post pain: Pain level controlled  Post assessment: Patient's Cardiovascular Status Stable  Last Vitals:  Filed Vitals:   02/15/14 1430  BP: 137/77  Pulse: 42  Temp:   Resp: 15    Post vital signs: Reviewed and stable  Level of consciousness: alert  Complications: No apparent anesthesia complications

## 2014-02-15 NOTE — Progress Notes (Signed)
Utilization Review Completed.Sherry Dennis T7/01/2014  

## 2014-02-16 ENCOUNTER — Encounter (HOSPITAL_COMMUNITY): Payer: Self-pay | Admitting: General Practice

## 2014-02-16 LAB — CBC
HCT: 40.3 % (ref 36.0–46.0)
Hemoglobin: 13.2 g/dL (ref 12.0–15.0)
MCH: 31.7 pg (ref 26.0–34.0)
MCHC: 32.8 g/dL (ref 30.0–36.0)
MCV: 96.9 fL (ref 78.0–100.0)
Platelets: 216 10*3/uL (ref 150–400)
RBC: 4.16 MIL/uL (ref 3.87–5.11)
RDW: 12.6 % (ref 11.5–15.5)
WBC: 16.1 10*3/uL — AB (ref 4.0–10.5)

## 2014-02-16 LAB — BASIC METABOLIC PANEL
Anion gap: 14 (ref 5–15)
BUN: 12 mg/dL (ref 6–23)
CALCIUM: 8.7 mg/dL (ref 8.4–10.5)
CO2: 23 mEq/L (ref 19–32)
CREATININE: 0.71 mg/dL (ref 0.50–1.10)
Chloride: 103 mEq/L (ref 96–112)
GFR calc non Af Amer: >90 mL/min (ref >90)
Glucose, Bld: 126 mg/dL — ABNORMAL HIGH (ref 70–99)
Potassium: 4.1 mEq/L (ref 3.7–5.3)
Sodium: 140 mEq/L (ref 137–147)

## 2014-02-16 MED ORDER — METHOCARBAMOL 500 MG PO TABS: 500.0000 mg | ORAL_TABLET | Freq: Four times a day (QID) | ORAL | Status: DC | PRN

## 2014-02-16 MED ORDER — OXYCODONE HCL 5 MG PO TABS: 5.0000 mg | ORAL_TABLET | ORAL | Status: DC | PRN

## 2014-02-16 MED ORDER — ENOXAPARIN SODIUM 40 MG/0.4ML ~~LOC~~ SOLN: 40.0000 mg | SUBCUTANEOUS | Status: DC

## 2014-02-16 MED ORDER — OXYCODONE HCL ER 10 MG PO T12A: 10.0000 mg | EXTENDED_RELEASE_TABLET | Freq: Two times a day (BID) | ORAL | Status: DC

## 2014-02-16 NOTE — Progress Notes (Signed)
D/C instructions and scripts given. Lovenox teaching done and she stated she felt comfortable with admin injection. Pts family is on the way to transport Pt home.

## 2014-02-16 NOTE — Progress Notes (Signed)
Physical Therapy Treatment Patient Details Name: Sherry Dennis MRN: [id number] DOB: 1961-02-08 Today's Date: 02/16/2014    History of Present Illness Patient is a 53 yo female admitted 02/15/14 now s/p Rt uni-compartment knee replacement.   PMH: HTN, dysrhythmia    PT Comments    Patient able to complete stair training. Reviewed HEP. Patient planning to D/C this PM.   Follow Up Recommendations        Equipment Recommendations       Recommendations for Other Services       Precautions / Restrictions Precautions Precautions: Knee Precaution Comments: reviewed Restrictions Weight Bearing Restrictions: Yes RLE Weight Bearing: Weight bearing as tolerated    Mobility  Bed Mobility Overal bed mobility: Needs Assistance Bed Mobility: Sit to Supine       Sit to supine: Supervision      Transfers Overall transfer level: Needs assistance Equipment used: Rolling walker (2 wheeled) Transfers: Sit to/from Stand Sit to Stand: Min guard Stand pivot transfers: Min guard       General transfer comment: Patient with good technique. Supervision for safety  Ambulation/Gait Ambulation/Gait assistance: Min guard Ambulation Distance (Feet): 180 Feet Assistive device: Rolling walker (2 wheeled) Gait Pattern/deviations: Step-to pattern;Decreased stride length     General Gait Details: Cues for gait sequence. Attempted step-through pattern, returned to step-to pattern due to knee buckling    Stairs Stairs: Yes Stairs assistance: Min guard Stair Management: Two rails;Step to pattern;Backwards;With walker Number of Stairs: 2 General stair comments: Min guard for safety, cues, and sequence of technique  Wheelchair Mobility    Modified Rankin (Stroke Patients Only)       Balance                                    Cognition Arousal/Alertness: Awake/alert Behavior During Therapy: WFL for tasks assessed/performed Overall Cognitive Status: Within Functional  Limits for tasks assessed                      Exercises Total Joint Exercises Heel Slides: AAROM;Right;10 reps Straight Leg Raises: AROM;Right;10 reps    General Comments        Pertinent Vitals/Pain Patient did not rate. Patient did not complain of pain throughout session.     Home Living                      Prior Function            PT Goals (current goals can now be found in the care plan section) Progress towards PT goals: Progressing toward goals    Frequency       PT Plan      Co-evaluation             End of Session Equipment Utilized During Treatment: Gait belt Activity Tolerance: Patient tolerated treatment well Patient left: in bed;with call bell/phone within reach     Time:  -     Charges:                       G Codes:      Jacqualyn Posey 02/16/2014, 2:24 PM 02/16/2014 Jacqualyn Posey PTA [phone] pager [phone] office

## 2014-02-16 NOTE — Progress Notes (Signed)
SPORTS MEDICINE AND JOINT REPLACEMENT  Sherry Mulch, MD   Carlynn Spry, PA-C [address], [place], [place]  [address]                             [phone]   PROGRESS NOTE  Subjective:  negative for Chest Pain  negative for Shortness of Breath  negative for Nausea/Vomiting   negative for Calf Pain  negative for Bowel Movement   Tolerating Diet: yes         Patient reports pain as 3 on 0-10 scale.    Objective: Vital signs in last 24 hours:   Patient Vitals for the past 24 hrs:  BP Temp Pulse Resp SpO2  02/16/14 1300 116/74 mmHg 98.7 F (37.1 C) 74 18 96 %  02/16/14 1200 - - - 18 -  02/16/14 0800 - - - 18 -  02/16/14 0549 135/72 mmHg 97.3 F (36.3 C) 56 18 97 %  02/15/14 2133 140/88 mmHg 97.6 F (36.4 C) 80 18 96 %    @flow {1959:LAST@   Intake/Output from previous day:   07/06 0701 - 07/07 0700 In: 1000 [I.V.:1000] Out: 50    Intake/Output this shift:   07/07 0701 - 07/07 1900 In: 1232.5 [P.O.:240; I.V.:992.5] Out: -    Intake/Output     07/06 0701 - 07/07 0700 07/07 0701 - 07/08 0700   P.O.  240   I.V. (mL/kg) 1000 (9.7) 992.5 (9.6)   Total Intake(mL/kg) 1000 (9.7) 1232.5 (12)   Blood 50    Total Output 50     Net +950 +1232.5        Urine Occurrence 4 x 2 x      LABORATORY DATA:  Recent Labs  02/15/14 1700 02/16/14 0540  WBC 12.6* 16.1*  HGB 12.7 13.2  HCT 38.7 40.3  PLT 211 216    Recent Labs  02/15/14 1700 02/16/14 0540  NA  --  140  K  --  4.1  CL  --  103  CO2  --  23  BUN  --  12  CREATININE 0.72 0.71  GLUCOSE  --  126*  CALCIUM  --  8.7   Lab Results  Component Value Date   INR 1.01 02/05/2014    Examination:  General appearance: alert, cooperative and no distress Extremities: Homans sign is negative, no sign of DVT  Wound Exam: clean, dry, intact   Drainage:  None: wound tissue dry  Motor Exam: EHL and FHL Intact  Sensory Exam: Deep Peroneal normal   Assessment:    1 Day Post-Op  Procedure(s)  (LRB): RIGHT UNICOMPARTMENTAL KNEE (MEDIAL COMPARTMENT) (Right)  ADDITIONAL DIAGNOSIS:  Active Problems:   S/P total knee arthroplasty  Acute Blood Loss Anemia   Plan: Physical Therapy as ordered Weight Bearing as Tolerated (WBAT)  DVT Prophylaxis:  Lovenox  DISCHARGE PLAN: Home  DISCHARGE NEEDS: HHPT, CPM, Walker and 3-in-1 comode seat         Sherry Dennis 02/16/2014, 4:52 PM

## 2014-02-16 NOTE — Care Management Note (Addendum)
CARE MANAGEMENT NOTE 02/16/2014  Patient:  Sherry Dennis, Sherry Dennis   Account Number:  [id number]  Date Initiated:  02/16/2014  Documentation initiated by:  Ricki Miller  Subjective/Objective Assessment:   53 yr old female s/p right unicompartmental medial knee arthroplasty.     Action/Plan:   Case manager spoke with patient concerning home health and DME needs at discharge. Choice offered. Referral called to [facility], [facility] liason.Family support at discharge.   Anticipated DC Date:  02/16/2014   Anticipated DC Plan:  [place]  CM consult      [facility] Choice  HOME HEALTH  DURABLE MEDICAL EQUIPMENT   Choice offered to / List presented to:  C-1 Patient   DME arranged  3-N-1  [place]  CPM      DME agency  [employer]     [place] arranged  HH-2 PT      [place].   Status of service:  Completed, signed off Medicare Important Message given?   (If response is "NO", the following Medicare IM given date fields will be blank) Date Medicare IM given:   Medicare IM given by:   Date Additional Medicare IM given:   Additional Medicare IM given by:    Discharge Disposition:  [place]  Per UR Regulation:  Reviewed for med. necessity/level of care/duration of stay   Comments:  02/16/14 Ricki Miller, RN BSN Case Manager Patient will be going to her daughter's home for recovery: [address] [place], [place] Cell: [place]

## 2014-02-16 NOTE — Evaluation (Signed)
Occupational Therapy Evaluation Patient Details Name: Sherry Dennis MRN: [id number] DOB: 11-Mar-1961 Today's Date: 02/16/2014    History of Present Illness Patient is a 53 yo female admitted 02/15/14 now s/p Rt uni-compartment knee replacement.   PMH: HTN, dysrhythmia   Clinical Impression   Pt admitted with the above diagnoses and seen for acute OT evaluation. PTA pt was independent with BADLs. Pt is currently performing LB ADLs and functional mobility at min guard level. Educated pt on techniques and use of AE for safe completion of ADLs with knee precautions. Pt will have 24 hour supervision/assistance at d/c. No further OT needs indicated at this time.     Follow Up Recommendations  Supervision/Assistance - 24 hour;No OT follow up    Equipment Recommendations  None recommended by OT;Other (comment) (Pt has equipment recommended by OT.)    Recommendations for Other Services       Precautions / Restrictions Precautions Precautions: Knee Precaution Comments: reviewed Restrictions Weight Bearing Restrictions: Yes RLE Weight Bearing: Weight bearing as tolerated      Mobility Bed Mobility               General bed mobility comments: pt in chair  Transfers Overall transfer level: Needs assistance Equipment used: Rolling walker (2 wheeled) Transfers: Sit to/from Stand Sit to Stand: Min guard         General transfer comment: Pt noted to toe touch weight bear on R leg. Reviewed WBAT status with pt.     Balance Overall balance assessment: Needs assistance Sitting-balance support: No upper extremity supported;Feet supported Sitting balance-Leahy Scale: Good     Standing balance support: Bilateral upper extremity supported;During functional activity Standing balance-Leahy Scale: Poor                              ADL Overall ADL's : Needs assistance/impaired Eating/Feeding: Set up;Sitting   Grooming: Set up;Sitting;Standing   Upper Body Bathing: Set  up;Sitting   Lower Body Bathing: Sit to/from stand;Min guard   Upper Body Dressing : Set up;Sitting   Lower Body Dressing: Min guard;With adaptive equipment;Sit to/from stand   Toilet Transfer: Min guard;Ambulation;RW (3n1 over toilet)   Toileting- Clothing Manipulation and Hygiene: Min guard;Sit to/from stand   Tub/ Shower Transfer: Min guard;Ambulation;3 in 1;Rolling walker   Functional mobility during ADLs: Min guard;Rolling walker Educated on having someone close by for shower transfers.      Vision                     Perception     Praxis      Pertinent Vitals/Pain Moderate pain in R knee. Increased activity during session.     Hand Dominance     Extremity/Trunk Assessment Upper Extremity Assessment Upper Extremity Assessment: Overall WFL for tasks assessed   Lower Extremity Assessment Lower Extremity Assessment: Defer to PT evaluation       Communication Communication Communication: No difficulties   Cognition Arousal/Alertness: Awake/alert Behavior During Therapy: WFL for tasks assessed/performed Overall Cognitive Status: Within Functional Limits for tasks assessed                     General Comments       Exercises       Shoulder Instructions      Home Living Family/patient expects to be discharged to:: Private residence Living Arrangements: Alone Available Help at Discharge: Family;Available 24 hours/day Type of Home: House  Home Access: Stairs to enter Entrance Stairs-Number of Steps: 4 Entrance Stairs-Rails: Right;Left Home Layout: Two level;Able to live on main level with bedroom/bathroom     Bathroom Shower/Tub: Teacher, early years/pre: Standard Bathroom Accessibility: Yes How Accessible: Accessible via walker Home Equipment: [place] - 2 wheels;Bedside commode;Hand held shower head;Adaptive equipment Adaptive Equipment: Reacher;Sock aid Additional Comments: Pt plans to stay with daughter for a few days  before returning home. Once home she has family that lives across the street from her.       Prior Functioning/Environment Level of Independence: Independent             OT Diagnosis:     OT Problem List:     OT Treatment/Interventions:      OT Goals(Current goals can be found in the care plan section) Acute Rehab OT Goals Patient Stated Goal: not stated  OT Frequency:     Barriers to D/C:            Co-evaluation              End of Session Equipment Utilized During Treatment: Rolling walker CPM Right Knee Additional Comments: educated on benefit of keeping R knee in extension and using footsie roll,  Activity Tolerance: Patient tolerated treatment well Patient left: in chair;with call bell/phone within reach   Time: 0851-0908 OT Time Calculation (min): 17 min Charges:  OT General Charges $OT Visit: 1 Procedure OT Evaluation $Initial OT Evaluation Tier I: 1 Procedure OT Treatments $Self Care/Home Management : 8-22 mins G-Codes:    Hortencia Pilar 2014-03-06, 9:21 AM

## 2014-02-16 NOTE — Op Note (Signed)
Dictation Number:  [id number]

## 2014-02-16 NOTE — Progress Notes (Signed)
Physical Therapy Treatment Patient Details Name: Sherry Dennis MRN: [id number] DOB: 06/10/1961 Today's Date: 02/16/2014    History of Present Illness Patient is a 53 yo female admitted 02/15/14 now s/p Rt uni-compartment knee replacement.   PMH: HTN, dysrhythmia    PT Comments    Patient able to progress with long hall ambulation this morning. Patient planning to DC today. Will practice steps this afternoon prior to DC.   Follow Up Recommendations  Outpatient PT;Supervision/Assistance - 24 hour (OPPT prearranged)     Equipment Recommendations  None recommended by PT    Recommendations for Other Services       Precautions / Restrictions Precautions Precautions: Knee Precaution Comments: reviewed Restrictions Weight Bearing Restrictions: Yes RLE Weight Bearing: Weight bearing as tolerated    Mobility  Bed Mobility               General bed mobility comments: pt in chair  Transfers Overall transfer level: Needs assistance Equipment used: Rolling walker (2 wheeled) Transfers: Sit to/from Stand Sit to Stand: Min guard;Supervision         General transfer comment: Patient with good technique. Supervision for safety  Ambulation/Gait Ambulation/Gait assistance: Min guard Ambulation Distance (Feet): 200 Feet Assistive device: Rolling walker (2 wheeled) Gait Pattern/deviations: Step-to pattern     General Gait Details: Cues for gait sequence and RW management.    Stairs            Wheelchair Mobility    Modified Rankin (Stroke Patients Only)       Balance Overall balance assessment: Needs assistance Sitting-balance support: No upper extremity supported;Feet supported Sitting balance-Leahy Scale: Good     Standing balance support: Bilateral upper extremity supported;During functional activity Standing balance-Leahy Scale: Poor                      Cognition Arousal/Alertness: Awake/alert Behavior During Therapy: WFL for tasks  assessed/performed Overall Cognitive Status: Within Functional Limits for tasks assessed                      Exercises Total Joint Exercises Quad Sets: AROM;Right;Seated;10 reps Heel Slides: AAROM;Right;10 reps Hip ABduction/ADduction: AROM;Right;10 reps Straight Leg Raises: AAROM;Right;10 reps Long Arc Quad: AAROM;Right;10 reps    General Comments        Pertinent Vitals/Pain no apparent distress     Home Living Family/patient expects to be discharged to:: Private residence Living Arrangements: Alone Available Help at Discharge: Family;Available 24 hours/day Type of Home: House Home Access: Stairs to enter Entrance Stairs-Rails: Right;Left Home Layout: Two level;Able to live on main level with bedroom/bathroom Home Equipment: Gilford Rile - 2 wheels;Bedside commode;Hand held shower head;Adaptive equipment Additional Comments: Pt plans to stay with daughter for a few days before returning home. Once home she has family that lives across the street from her.     Prior Function Level of Independence: Independent          PT Goals (current goals can now be found in the care plan section) Acute Rehab PT Goals Patient Stated Goal: not stated Progress towards PT goals: Progressing toward goals    Frequency  7X/week    PT Plan Current plan remains appropriate    Co-evaluation             End of Session Equipment Utilized During Treatment: Gait belt Activity Tolerance: Patient tolerated treatment well Patient left: in chair;with call bell/phone within reach     Time: 0901-0930 PT Time Calculation (  min): 29 min  Charges:  $Gait Training: 8-22 mins $Therapeutic Exercise: 8-22 mins                    G Codes:      Jacqualyn Posey 02/16/2014, 9:44 AM 02/16/2014 Jacqualyn Posey PTA [phone] pager [phone] office

## 2014-02-16 NOTE — Discharge Instructions (Signed)
Diet: As you were doing prior to hospitalization   Activity:  Increase activity slowly as tolerated                  No lifting or driving for 6 weeks  Shower:  May shower without a dressing once there is no drainage from your wound.                 Do NOT wash over the wound.                 Dressing:  You may change your dressing on Wednesday                    Then change the dressing daily with sterile 4"x4"s gauze dressing                     And TED hose for knees.  Weight Bearing:  Weight bearing as tolerated as taught in physical therapy.  Use a                                walker or Crutches as instructed.  To prevent constipation: you may use a stool softener such as -               Colace ( over the counter) 100 mg by mouth twice a day                Drink plenty of fluids ( prune juice may be helpful) and high fiber foods                Miralax ( over the counter) for constipation as needed.    Precautions:  If you experience chest pain or shortness of breath - call 911 immediately               For transfer to the hospital emergency department!!               If you develop a fever greater that 101 F, purulent drainage from wound,                             increased redness or drainage from wound, or calf pain -- Call the office.  Follow- Up Appointment:  Please call for an appointment to be seen on 03/02/14                                              [facility] office:  [phone]            [address] [place], [place] [address]

## 2014-02-17 ENCOUNTER — Encounter (HOSPITAL_COMMUNITY): Payer: Self-pay | Admitting: Orthopedic Surgery

## 2014-02-17 NOTE — Op Note (Signed)
NAMERAYANA, GEURIN NO.:  [id number]  MEDICAL RECORD NO.:  [id number]  LOCATION:  5N05C                        FACILITY:  [facility]  PHYSICIAN:  Estill Bamberg. Ronnie Derby, M.D. DATE OF BIRTH:  08/21/1960  DATE OF PROCEDURE:  02/15/2014 DATE OF DISCHARGE:  02/16/2014                              OPERATIVE REPORT   SURGEON:  Estill Bamberg. Ronnie Derby, M.D.  ASSISTANT:  Nehemiah Massed, PA-C and Carlynn Spry, PA-C.  ANESTHESIA:  General.  PREOPERATIVE DIAGNOSIS:  Right knee unicompartmental arthritis.  POSTOPERATIVE DIAGNOSIS:  Right knee unicompartmental arthritis.  PROCEDURE:  Right knee unicompartmental arthroplasty of medial compartment.  INDICATION FOR PROCEDURE:  The patient is a 53 year old status post Conservative treatment for arthritis.  Informed consent was obtained.  DESCRIPTION OF PROCEDURE:  The patient was laid supine, administered general anesthesia.  Right leg was prepped and draped in usual fashion. The extremity was exsanguinated with Esmarch, tourniquet inflated to 350 mmHg.  Midline incision was made with a #10 blade approximately 4 to 5 cm.  Then we used a new blade to incise and make an arthrotomy in the medial parapatellar margin and then removed the retroperitoneal fat pad in the medial compartment.  I reflected the meniscus and deep MCL tibia.  I then used the patient specific cutting block.  I removed the cut surface and the block.  A femoral cut was made in extension. I then trialed with 3 tibia, E femur 8 mm poly with good ligament tension.  Then, we removed trial components, copiously irrigated, and then cemented all components removed all excess cement, snapped in our polyethylene.  Obtained hemostasis.  I then closed the arthrotomy with figure-of-eight #1 Vicryl sutures, deep soft tissue with buried 0 Vicryl sutures and subcuticular 4-0 Monocryl and Steri-Strips, Xeroform dressing, dressing sponges, sterile Webril, Ace wrap.  DRAINS:   None.          ______________________________ Estill Bamberg. Ronnie Derby, M.D.    SDL/MEDQ  D:  02/16/2014  T:  02/17/2014  Job:  [id number]

## 2014-02-24 NOTE — Discharge Summary (Signed)
SPORTS MEDICINE & JOINT REPLACEMENT   [name], MD   Carlynn Spry, PA-C [place], [place], [place]  [address]                             [phone]  PATIENT ID: Sherry Dennis        MRN:  [id number]          DOB/AGE: [birth year] / 53 y.o.    DISCHARGE SUMMARY  ADMISSION DATE:    02/15/2014 DISCHARGE DATE:   02/16/2014  ADMISSION DIAGNOSIS: right knee medial compartment osteoarthritis    DISCHARGE DIAGNOSIS:  right knee medial compartment osteoarthritis    ADDITIONAL DIAGNOSIS: Active Problems:   S/P total knee arthroplasty  Past Medical History  Diagnosis Date  . Hypertension   . Hyperlipidemia   . Thyroid disease   . Vitamin D deficiency   . Palpitations   . PONV (postoperative nausea and vomiting)   . Pneumonia     hx  . Hypothyroidism   . UTI (lower urinary tract infection)   . GERD (gastroesophageal reflux disease)   . H/O hiatal hernia   . Arthritis   . Cancer     skin  . Dysrhythmia     palpitations occ; SVT 09/2013 s/p adenosine    PROCEDURE: Procedure(s): RIGHT UNICOMPARTMENTAL KNEE (MEDIAL COMPARTMENT) on 02/15/2014  CONSULTS:     HISTORY:  See H&P in chart  HOSPITAL COURSE:  Sherry Dennis is a 53 y.o. admitted on 02/15/2014 and found to have a diagnosis of right knee medial compartment osteoarthritis.  After appropriate laboratory studies were obtained  they were taken to the operating room on 02/15/2014 and underwent Procedure(s): RIGHT UNICOMPARTMENTAL KNEE (MEDIAL COMPARTMENT).   They were given perioperative antibiotics:  Anti-infectives   Start     Dose/Rate Route Frequency Ordered Stop   02/15/14 1800  ceFAZolin (ANCEF) IVPB 2 g/50 mL premix     2 g 100 mL/hr over 30 Minutes Intravenous Every 6 hours 02/15/14 1646 02/16/14 0040   02/15/14 0600  ceFAZolin (ANCEF) IVPB 2 g/50 mL premix     2 g 100 mL/hr over 30 Minutes Intravenous On call to O.R. 02/14/14 1926 02/15/14 1200    .  Tolerated the procedure well.  Placed with a foley  intraoperatively.  Given Ofirmev at induction and for 48 hours.    POD# 1: Vital signs were stable.  Patient denied Chest pain, shortness of breath, or calf pain.  Patient was started on Lovenox 30 mg subcutaneously twice daily at 8am.  Consults to PT, OT, and care management were made.  The patient was weight bearing as tolerated.  CPM was placed on the operative leg 0-90 degrees for 6-8 hours a day.  Incentive spirometry was taught.  Dressing was changed.  Hemovac were discontinued.      Continued  PT for ambulation and exercise program.  IV saline locked.  O2 discontinued.    The remainder of the hospital course was dedicated to ambulation and strengthening.   The patient was discharged on 1 day post op in  Good condition.  Blood products given:none  DIAGNOSTIC STUDIES: Recent vital signs: No data found.      Recent laboratory studies: No results found for this basename: WBC, HGB, HCT, PLT,  in the last 168 hours No results found for this basename: NA, K, CL, CO2, BUN, CREATININE, GLUCOSE, CALCIUM,  in the last 168 hours Lab Results  Component Value Date   INR 1.01 02/05/2014     Recent Radiographic Studies :  Dg Knee Right Port  02/16/2014   CLINICAL DATA:  Postoperative right knee surgery.  EXAM: PORTABLE RIGHT KNEE - 1-2 VIEW  COMPARISON:  MRI right knee 12/19/2013  FINDINGS: Postoperative changes with medial compartment right knee hemiarthroplasty. Components appear well-seated. Small effusion with soft tissue gas in the right knee consistent with recent surgery. No acute fracture or dislocation suggested.  IMPRESSION: Postoperative changes in the right knee. No acute bony abnormalities.   Electronically Signed   By: Lucienne Capers M.D.   On: 02/16/2014 01:10    DISCHARGE INSTRUCTIONS: Discharge Instructions   CPM    Complete by:  As directed   Continuous passive motion machine (CPM):      Use the CPM from 0 to 90 for 6-8 hours per day.      You may increase by 10 per day.   You may break it up into 2 or 3 sessions per day.      Use CPM for 2 weeks or until you are told to stop.     Call MD / Call 911    Complete by:  As directed   If you experience chest pain or shortness of breath, CALL 911 and be transported to the hospital emergency room.  If you develope a fever above 101 F, pus (white drainage) or increased drainage or redness at the wound, or calf pain, call your surgeon's office.     Change dressing    Complete by:  As directed   Change dressing on Wednesday, then change the dressing daily with sterile 4 x 4 inch gauze dressing and apply TED hose.     Constipation Prevention    Complete by:  As directed   Drink plenty of fluids.  Prune juice may be helpful.  You may use a stool softener, such as Colace (over the counter) 100 mg twice a day.  Use MiraLax (over the counter) for constipation as needed.     Diet - low sodium heart healthy    Complete by:  As directed      Do not put a pillow under the knee. Place it under the heel.    Complete by:  As directed      Driving restrictions    Complete by:  As directed   No driving for 6 weeks     Increase activity slowly as tolerated    Complete by:  As directed      Lifting restrictions    Complete by:  As directed   No lifting for 6 weeks     TED hose    Complete by:  As directed   Use stockings (TED hose) for 3 weeks on both leg(s).  You may remove them at night for sleeping.           DISCHARGE MEDICATIONS:     Medication List         enoxaparin 40 MG/0.4ML injection  Commonly known as:  LOVENOX  Inject 0.4 mLs (40 mg total) into the skin daily.     fluticasone 50 MCG/ACT nasal spray  Commonly known as:  FLONASE  Place 1 spray into both nostrils daily.     hydrochlorothiazide 25 MG tablet  Commonly known as:  HYDRODIURIL  Take 25 mg by mouth daily as needed (fluid).     ibuprofen 800 MG tablet  Commonly known as:  ADVIL,MOTRIN  Take 800  mg by mouth every 8 (eight) hours as needed for  moderate pain.     levothyroxine 100 MCG tablet  Commonly known as:  SYNTHROID, LEVOTHROID  Take 50-100 mcg by mouth every evening. 1/2 tablet on Tuesday, Thursday, and Saturday,  Whole tablet all other days     methocarbamol 500 MG tablet  Commonly known as:  ROBAXIN  Take 1-2 tablets (500-1,000 mg total) by mouth every 6 (six) hours as needed for muscle spasms.     metoprolol tartrate 25 MG tablet  Commonly known as:  LOPRESSOR  Take 25 mg by mouth daily as needed (heart palipations).     ondansetron 8 MG disintegrating tablet  Commonly known as:  ZOFRAN ODT  Take 1 tablet (8 mg total) by mouth every 8 (eight) hours as needed for nausea or vomiting.     OVER THE COUNTER MEDICATION  Take 1 tablet by mouth daily as needed (acid reducer). OTC Equate brand Acid Reducer     OVER THE COUNTER MEDICATION  Take 1 tablet by mouth every morning. Equate brand allergy Rx     oxyCODONE 5 MG immediate release tablet  Commonly known as:  Oxy IR/ROXICODONE  Take 1-2 tablets (5-10 mg total) by mouth every 3 (three) hours as needed for breakthrough pain.     OxyCODONE 10 mg T12a 12 hr tablet  Commonly known as:  OXYCONTIN  Take 1 tablet (10 mg total) by mouth every 12 (twelve) hours.     VITAMIN B-12 PO  Take 1 tablet by mouth every evening.     Vitamin D3 10000 UNITS capsule  Take 10,000 Units by mouth every evening.        FOLLOW UP VISIT:       Follow-up Information   Follow up with [facility]. (Someone from [facility] will contact you concerning start date and time for physical therapy.)    Contact information:   [address] [place] [address] [phone]       Follow up with Rudean Haskell, MD. Call on 03/02/2014.   Specialty:  Orthopedic Surgery   Contact information:   [place]  [address] [phone]       DISPOSITION: HOME   CONDITION:  Good   Shamona Wirtz 02/24/2014, 9:21 PM

## 2014-05-12 DIAGNOSIS — Z9889 Other specified postprocedural states: Secondary | ICD-10-CM | POA: Insufficient documentation

## 2014-05-28 ENCOUNTER — Other Ambulatory Visit: Payer: Self-pay

## 2014-06-08 ENCOUNTER — Ambulatory Visit: Payer: Self-pay | Admitting: Emergency Medicine

## 2014-06-09 ENCOUNTER — Ambulatory Visit: Payer: Self-pay | Admitting: Physician Assistant

## 2014-06-15 ENCOUNTER — Encounter: Payer: Self-pay | Admitting: Physician Assistant

## 2014-06-15 ENCOUNTER — Ambulatory Visit (INDEPENDENT_AMBULATORY_CARE_PROVIDER_SITE_OTHER): Payer: 59 | Admitting: Physician Assistant

## 2014-06-15 VITALS — BP 138/86 | HR 82 | Temp 98.4°F | Resp 18 | Ht 66.0 in | Wt 221.0 lb

## 2014-06-15 DIAGNOSIS — J0100 Acute maxillary sinusitis, unspecified: Secondary | ICD-10-CM

## 2014-06-15 MED ORDER — AZITHROMYCIN 250 MG PO TABS: ORAL_TABLET | ORAL | Status: AC

## 2014-06-15 MED ORDER — PREDNISONE 20 MG PO TABS: ORAL_TABLET | ORAL | Status: DC

## 2014-06-15 NOTE — Progress Notes (Signed)
Subjective:    Patient ID: Sherry Dennis, female    DOB: [birth year], 53 y.o.   MRN: [id number]  Sinusitis This is a new problem. Episode onset: Put on amoxcillin on Tuesday by [facility] provider.  SXS started last friday. The problem is unchanged. Maximum temperature: Felt feverish but never took temperature. Her pain is at a severity of 8/10. The pain is moderate. Associated symptoms include congestion, coughing, ear pain, headaches, sinus pressure and swollen glands. Pertinent negatives include no chills, diaphoresis, hoarse voice, neck pain, shortness of breath, sneezing or sore throat. (Eye pain) Treatments tried: Amoxicillin. The treatment provided no relief.   Review of Systems  Constitutional: Positive for fatigue. Negative for fever, chills, diaphoresis and appetite change.  HENT: Positive for congestion, dental problem, ear pain, postnasal drip and sinus pressure. Negative for ear discharge, facial swelling, hoarse voice, rhinorrhea, sneezing, sore throat and trouble swallowing.        Dental pain  Eyes: Positive for redness.       [facility] already prescribed antibiotic drop for pink eye in left eye.  Respiratory: Positive for cough. Negative for chest tightness, shortness of breath, wheezing and stridor.        Mild cough with no production.  Cardiovascular: Negative.  Negative for chest pain.  Gastrointestinal: Negative.  Negative for nausea, vomiting, abdominal pain, diarrhea and constipation.  Genitourinary: Negative.   Musculoskeletal: Negative.  Negative for neck pain.  Skin: Negative.  Negative for rash.  Allergic/Immunologic: Positive for environmental allergies.       Takes Equate brand allergy pill.  Neurological: Positive for headaches. Negative for weakness and light-headedness.  Psychiatric/Behavioral: Negative.    Past Medical History  Diagnosis Date  . Hypertension   . Hyperlipidemia   . Thyroid disease   . Vitamin D deficiency   .  Palpitations   . PONV (postoperative nausea and vomiting)   . Pneumonia     hx  . Hypothyroidism   . UTI (lower urinary tract infection)   . GERD (gastroesophageal reflux disease)   . H/O hiatal hernia   . Arthritis   . Cancer     skin  . Dysrhythmia     palpitations occ; SVT 09/2013 s/p adenosine   Current Outpatient Prescriptions on File Prior to Visit  Medication Sig Dispense Refill  . Cholecalciferol (VITAMIN D3) 10000 UNITS capsule Take 10,000 Units by mouth every evening.    . Cyanocobalamin (VITAMIN B-12 PO) Take 1 tablet by mouth every evening.     . fluticasone (FLONASE) 50 MCG/ACT nasal spray Place 1 spray into both nostrils daily.    . hydrochlorothiazide (HYDRODIURIL) 25 MG tablet Take 25 mg by mouth daily as needed (fluid).     Marland Kitchen ibuprofen (ADVIL,MOTRIN) 800 MG tablet Take 800 mg by mouth every 8 (eight) hours as needed for moderate pain.    Marland Kitchen levothyroxine (SYNTHROID, LEVOTHROID) 100 MCG tablet Take 50-100 mcg by mouth every evening. 1/2 tablet on Tuesday, Thursday, and Saturday,  Whole tablet all other days    . metoprolol tartrate (LOPRESSOR) 25 MG tablet Take 25 mg by mouth daily as needed (heart palipations).     Marland Kitchen OVER THE COUNTER MEDICATION Take 1 tablet by mouth every morning. Equate brand allergy Rx    . OVER THE COUNTER MEDICATION Take 1 tablet by mouth daily as needed (acid reducer). OTC Equate brand Acid Reducer     No current facility-administered medications on file prior to visit.   Allergies  Allergen Reactions  . Darvocet [Propoxyphene N-Acetaminophen] Anaphylaxis  . Darvon [Propoxyphene Hcl] Anaphylaxis    Airway swelling   . Tamiflu [Oseltamivir Phosphate] Other (See Comments)    Increased blood pressure Hives      BP 138/86 mmHg  Pulse 82  Temp(Src) 98.4 F (36.9 C) (Temporal)  Resp 18  Ht 5\' 6"  (1.676 m)  Wt 221 lb (100.245 kg)  BMI 35.69 kg/m2  SpO2 95% Objective:   Physical Exam  Constitutional: She is oriented to person, place, and  time. She appears well-developed and well-nourished. She has a sickly appearance. No distress.  HENT:  Head: Normocephalic and atraumatic.  Right Ear: Tympanic membrane normal. There is tenderness. No drainage or swelling. Tympanic membrane is not injected, not scarred, not perforated, not erythematous, not retracted and not bulging.  Left Ear: Tympanic membrane normal. There is tenderness. No drainage or swelling. Tympanic membrane is not injected, not scarred, not perforated, not erythematous, not retracted and not bulging.  Nose: No mucosal edema or rhinorrhea. Right sinus exhibits maxillary sinus tenderness. Right sinus exhibits no frontal sinus tenderness. Left sinus exhibits maxillary sinus tenderness. Left sinus exhibits no frontal sinus tenderness.  Mouth/Throat: Uvula is midline. Mucous membranes are not pale and not dry. No trismus in the jaw. No uvula swelling. Posterior oropharyngeal erythema present. No oropharyngeal exudate, posterior oropharyngeal edema or tonsillar abscesses.  Mild bilateral canal erythema and tenderness.  TMs were non-erythematous and non-edematous with normal cone of light.  Clear fluid behind TMs bilaterally.  Turbinates are erythematous but not edematous bilaterally.  Mild posterior oropharyngeal erythema.  Eyes: Lids are normal. Pupils are equal, round, and reactive to light. Right eye exhibits no discharge, no exudate and no hordeolum. Left eye exhibits no discharge, no exudate and no hordeolum. Right conjunctiva is not injected. Left conjunctiva is injected. No scleral icterus.  Neck: Trachea normal and normal range of motion. Neck supple. No tracheal tenderness present. No tracheal deviation present.  Cardiovascular: Normal rate, regular rhythm, S1 normal, S2 normal, normal heart sounds, intact distal pulses and normal pulses.  Exam reveals no gallop, no distant heart sounds and no friction rub.   No murmur heard. Pulmonary/Chest: Effort normal and breath  sounds normal. No accessory muscle usage or stridor. No tachypnea and no bradypnea. No respiratory distress. She has no decreased breath sounds. She has no wheezes. She has no rhonchi. She has no rales. She exhibits no tenderness.  Abdominal: Soft. Bowel sounds are normal. There is no tenderness. There is no rebound and no guarding.  Musculoskeletal: Normal range of motion.  Lymphadenopathy:       Head (right side): Tonsillar adenopathy present. No submental, no submandibular, no preauricular, no posterior auricular and no occipital adenopathy present.       Head (left side): Tonsillar adenopathy present. No submental, no submandibular, no preauricular, no posterior auricular and no occipital adenopathy present.    She has no cervical adenopathy.       Right: No supraclavicular adenopathy present.       Left: No supraclavicular adenopathy present.  Mild tenderness upon palpation on tonsillar lymph nodes bilaterally.  Neurological: She is alert and oriented to person, place, and time. She has normal strength.  Skin: Skin is warm, dry and intact. No rash noted. She is not diaphoretic. No cyanosis. Nails show no clubbing.  Some lesions on hands and dorsal forearms bilaterally but patient not concerned about those at this time.  Looks like psoriasis?  Psychiatric: She has a normal  mood and affect. Her speech is normal and behavior is normal. Judgment and thought content normal. Cognition and memory are normal.  Vitals reviewed.     Assessment & Plan:  1. Acute maxillary sinusitis, recurrence not specified -Take the Z-Pak as prescribed- azithromycin (ZITHROMAX) 250 MG tablet; Take 2 tablets PO on day 1, then 1 tablet PO Q24H x 4 days  Dispense: 6 tablet; Refill: 0 -Take the prednisone as prescribed and watch what you eat as it can cause weight gain- predniSONE (DELTASONE) 20 MG tablet; Take 3 tablets PO QDaily for 3 days, then take 2 tablets PO QDaily for 3 days, then take 1 tablet PO QDaily for 3  days  Dispense: 18 tablet; Refill: 0 Patient states she does not have any reaction to prednisone and tolerates it just fine. -Drink plenty of fluids to stay hydrated and to thin mucous. -When drinking fluids, pinch and hold nose close and swallow to open eustachian tubes to drain fluid. -Take Tylenol 325mg  OTC- 1 tablet every 6 hours Max: 4 per day. -Continue Equate allergy pill to help with fluid behind ears. -Try steam showers to open your nasal passages. -Flonase OTC- Take 2 sprays in each nostril at bedtime.  Make sure you spray towards the outside of each nostril towards the outer corner of your eye, hold nose close and tilt head back.  This will help the medication get into your sinuses.  If you do not like this medication, then use saline nasal sprays same directions as above for Flonase. Discussed medication effects and SE's.  Pt agreed with treatment plan.  If you are not feeling better in 10-14 days, then call the office.  Enrika Aguado, Stephani Police, PA-C 3:53 PM [facility]

## 2014-06-15 NOTE — Patient Instructions (Signed)
- Take the Z-Pak as prescribed. -Take the prednisone as prescribed.   -Drink plenty of fluids to stay hydrated. -When drinking fluids, pinch and hold nose close and swallow to open eustachian tubes. -Take Flonase as prescribed. -Take Tylenol for pain -Take equate allergy pill  -Try steam showers to open your nasal passages.  Drink lots of water to stay hydrated and to thin mucous.  Flonase- Take 2 sprays in each nostril at bedtime.  Make sure you spray towards the outside of each nostril towards the outer corner of your eye, hold nose close and tilt head back.  This will help the medication get into your sinuses.  If you do not like this medication, then use saline nasal sprays same directions as above for Flonase.  -It can take up to 2 weeks to feel better.  Sinusitis is mostly caused by viruses. If you are not feeling better in 10-14 days, then call the office.  Sinusitis Sinusitis is redness, soreness, and inflammation of the paranasal sinuses. Paranasal sinuses are air pockets within the bones of your face (beneath the eyes, the middle of the forehead, or above the eyes). In healthy paranasal sinuses, mucus is able to drain out, and air is able to circulate through them by way of your nose. However, when your paranasal sinuses are inflamed, mucus and air can become trapped. This can allow bacteria and other germs to grow and cause infection. Sinusitis can develop quickly and last only a short time (acute) or continue over a long period (chronic). Sinusitis that lasts for more than 12 weeks is considered chronic.  CAUSES  Causes of sinusitis include:  Allergies.  Structural abnormalities, such as displacement of the cartilage that separates your nostrils (deviated septum), which can decrease the air flow through your nose and sinuses and affect sinus drainage.  Functional abnormalities, such as when the small hairs (cilia) that line your sinuses and help remove mucus do not work properly  or are not present. SIGNS AND SYMPTOMS  Symptoms of acute and chronic sinusitis are the same. The primary symptoms are pain and pressure around the affected sinuses. Other symptoms include:  Upper toothache.  Earache.  Headache.  Bad breath.  Decreased sense of smell and taste.  A cough, which worsens when you are lying flat.  Fatigue.  Fever.  Thick drainage from your nose, which often is green and may contain pus (purulent).  Swelling and warmth over the affected sinuses. DIAGNOSIS  Your health care provider will perform a physical exam. During the exam, your health care provider may:  Look in your nose for signs of abnormal growths in your nostrils (nasal polyps).  Tap over the affected sinus to check for signs of infection.  View the inside of your sinuses (endoscopy) using an imaging device that has a light attached (endoscope). If your health care provider suspects that you have chronic sinusitis, one or more of the following tests may be recommended:  Allergy tests.  Nasal culture. A sample of mucus is taken from your nose, sent to a lab, and screened for bacteria.  Nasal cytology. A sample of mucus is taken from your nose and examined by your health care provider to determine if your sinusitis is related to an allergy. TREATMENT  Most cases of acute sinusitis are related to a viral infection and will resolve on their own within 10 days. Sometimes medicines are prescribed to help relieve symptoms (pain medicine, decongestants, nasal steroid sprays, or saline sprays).  However, for  sinusitis related to a bacterial infection, your health care provider will prescribe antibiotic medicines. These are medicines that will help kill the bacteria causing the infection.  Rarely, sinusitis is caused by a fungal infection. In theses cases, your health care provider will prescribe antifungal medicine. For some cases of chronic sinusitis, surgery is needed. Generally, these are  cases in which sinusitis recurs more than 3 times per year, despite other treatments. HOME CARE INSTRUCTIONS   Drink plenty of water. Water helps thin the mucus so your sinuses can drain more easily.  Use a humidifier.  Inhale steam 3 to 4 times a day (for example, sit in the bathroom with the shower running).  Apply a warm, moist washcloth to your face 3 to 4 times a day, or as directed by your health care provider.  Use saline nasal sprays to help moisten and clean your sinuses.  Take medicines only as directed by your health care provider.  If you were prescribed either an antibiotic or antifungal medicine, finish it all even if you start to feel better. SEEK IMMEDIATE MEDICAL CARE IF:  You have increasing pain or severe headaches.  You have nausea, vomiting, or drowsiness.  You have swelling around your face.  You have vision problems.  You have a stiff neck.  You have difficulty breathing. MAKE SURE YOU:   Understand these instructions.  Will watch your condition.  Will get help right away if you are not doing well or get worse. Document Released: 07/30/2005 Document Revised: 12/14/2013 Document Reviewed: 08/14/2011 [facility] Patient Information 2015 [place], [place]. This information is not intended to replace advice given to you by your health care provider. Make sure you discuss any questions you have with your health care provider.

## 2014-06-23 ENCOUNTER — Ambulatory Visit (INDEPENDENT_AMBULATORY_CARE_PROVIDER_SITE_OTHER): Payer: 59 | Admitting: Physician Assistant

## 2014-06-23 ENCOUNTER — Encounter: Payer: Self-pay | Admitting: Physician Assistant

## 2014-06-23 VITALS — BP 128/82 | HR 80 | Temp 97.7°F | Resp 16 | Wt 222.0 lb

## 2014-06-23 DIAGNOSIS — E785 Hyperlipidemia, unspecified: Secondary | ICD-10-CM

## 2014-06-23 DIAGNOSIS — E669 Obesity, unspecified: Secondary | ICD-10-CM

## 2014-06-23 DIAGNOSIS — R7303 Prediabetes: Secondary | ICD-10-CM

## 2014-06-23 DIAGNOSIS — R002 Palpitations: Secondary | ICD-10-CM

## 2014-06-23 DIAGNOSIS — E079 Disorder of thyroid, unspecified: Secondary | ICD-10-CM

## 2014-06-23 DIAGNOSIS — Z79899 Other long term (current) drug therapy: Secondary | ICD-10-CM

## 2014-06-23 DIAGNOSIS — E559 Vitamin D deficiency, unspecified: Secondary | ICD-10-CM

## 2014-06-23 DIAGNOSIS — I10 Essential (primary) hypertension: Secondary | ICD-10-CM

## 2014-06-23 LAB — CBC WITH DIFFERENTIAL/PLATELET
BASOS PCT: 0 % (ref 0–1)
Basophils Absolute: 0 10*3/uL (ref 0.0–0.1)
EOS PCT: 1 % (ref 0–5)
Eosinophils Absolute: 0.1 10*3/uL (ref 0.0–0.7)
HCT: 39.4 % (ref 36.0–46.0)
Hemoglobin: 13.2 g/dL (ref 12.0–15.0)
Lymphocytes Relative: 29 % (ref 12–46)
Lymphs Abs: 2.6 10*3/uL (ref 0.7–4.0)
MCH: 31.7 pg (ref 26.0–34.0)
MCHC: 33.5 g/dL (ref 30.0–36.0)
MCV: 94.7 fL (ref 78.0–100.0)
MONOS PCT: 10 % (ref 3–12)
Monocytes Absolute: 0.9 10*3/uL (ref 0.1–1.0)
NEUTROS PCT: 60 % (ref 43–77)
Neutro Abs: 5.3 10*3/uL (ref 1.7–7.7)
PLATELETS: 240 10*3/uL (ref 150–400)
RBC: 4.16 MIL/uL (ref 3.87–5.11)
RDW: 13.4 % (ref 11.5–15.5)
WBC: 8.9 10*3/uL (ref 4.0–10.5)

## 2014-06-23 IMAGING — CR DG CHEST 2V
2 series · 2 of 2 positions shown · non-contrast
Comparison: 05/07/2012

CLINICAL DATA: Shortness of breath

EXAM:
CHEST  2 VIEW

[w chest pa]
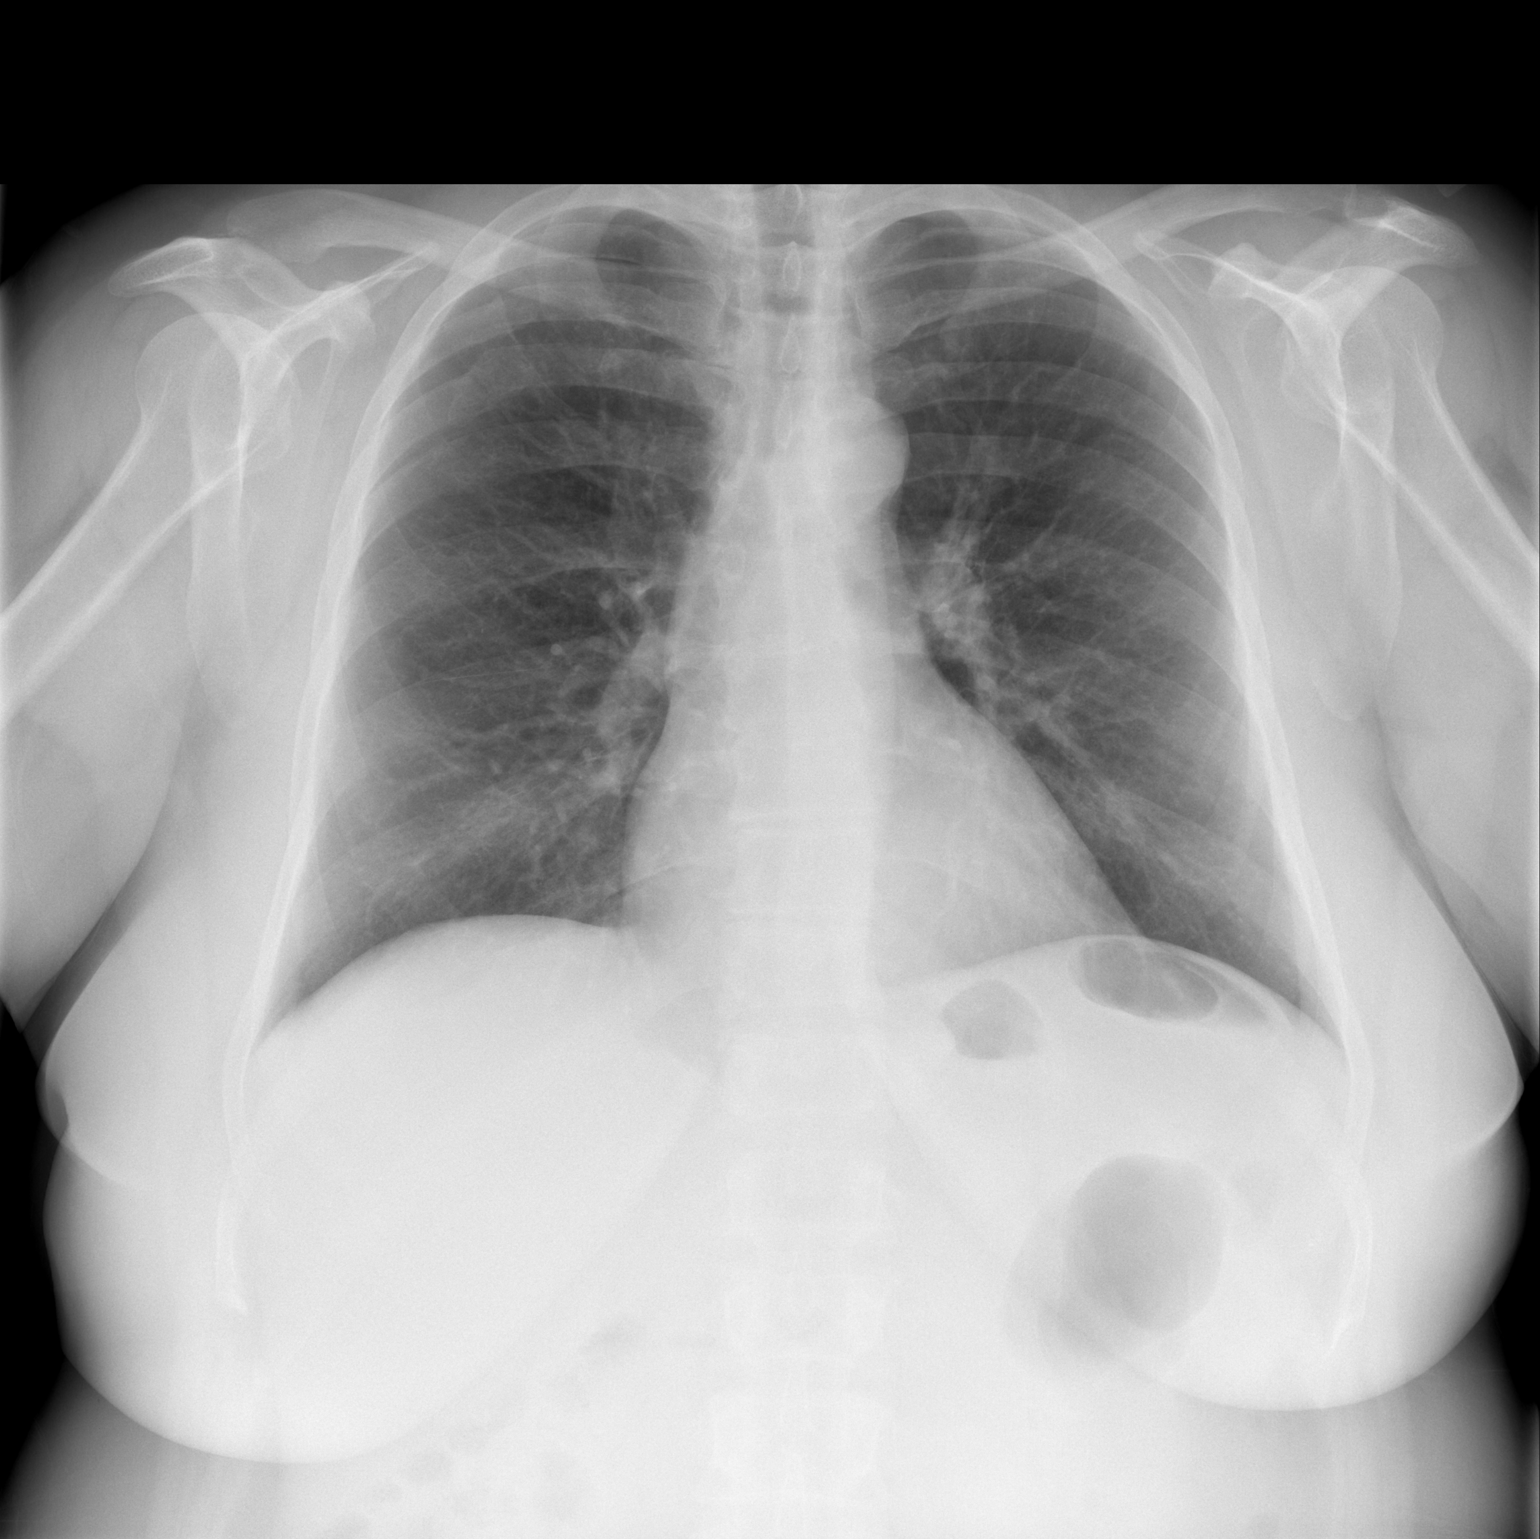

[w chest lat]
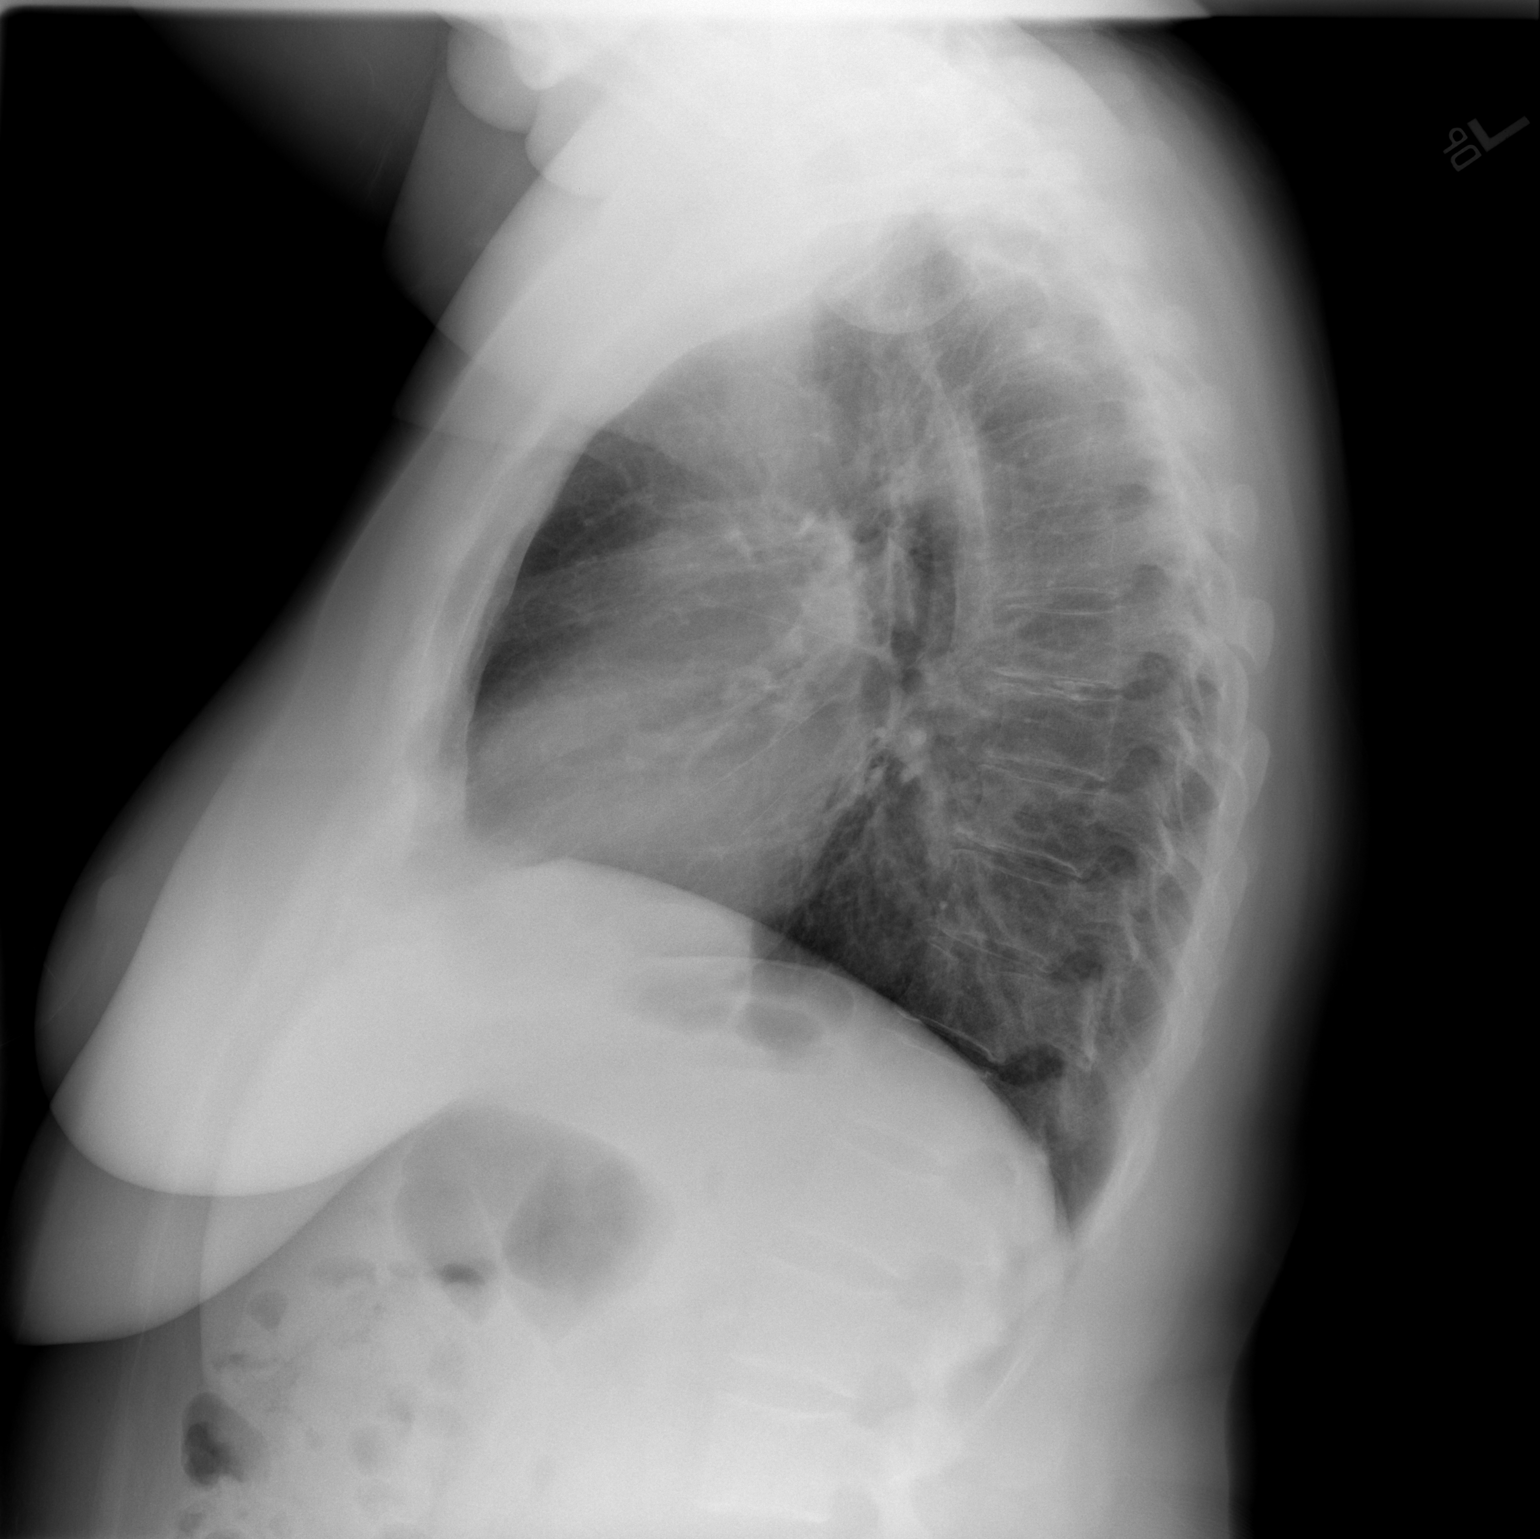

[2 of 2 positions shown; findings below may reference images not displayed]

FINDINGS: The heart size and mediastinal contours are within normal limits.
Both lungs are clear. The visualized skeletal structures are
unremarkable. Old rib fractures are again noted on the right.
IMPRESSION: No active cardiopulmonary disease.

## 2014-06-23 NOTE — Patient Instructions (Signed)
  Bad carbs also include fruit juice, alcohol, and sweet tea. These are empty calories that do not signal to your brain that you are full.   Please remember the good carbs are still carbs which convert into sugar. So please measure them out no more than 1/2-1 cup of rice, oatmeal, pasta, and beans  Veggies are however free foods! Pile them on.   Not all fruit is created equal. Please see the list below, the fruit at the bottom is higher in sugars than the fruit at the top. Please avoid all dried fruits.    We want weight loss that will last so you should lose 1-2 pounds a week.  THAT IS IT! Please pick THREE things a month to change. Once it is a habit check off the item. Then pick another three items off the list to become habits.  If you are already doing a habit on the list GREAT!  Cross that item off! o Don't drink your calories. Ie, alcohol, soda, fruit juice, and sweet tea.  o Drink more water. Drink a glass when you feel hungry or before each meal.  o Eat breakfast - Complex carb and protein (likeDannon light and fit yogurt, oatmeal, fruit, eggs, turkey bacon). o Measure your cereal.  Eat no more than one cup a day. (ie Kashi) o Eat an apple a day. o Add a vegetable a day. o Try a new vegetable a month. o Use Pam! Stop using oil or butter to [place]. o Don't finish your plate or use smaller plates. o Share your dessert. o Eat sugar free Jello for dessert or frozen grapes. o Don't eat 2-3 hours before bed. o Switch to whole wheat bread, pasta, and brown rice. o Make healthier choices when you eat out. No fries! o Pick baked chicken, NOT fried. o Don't forget to SLOW DOWN when you eat. It is not going anywhere.  o Take the stairs. o Park far away in the parking lot o Lift soup cans (or weights) for 10 minutes while watching TV. o Walk at work for 10 minutes during break. o Walk outside 1 time a week with your friend, kids, dog, or significant other. o Start a walking group at  church. o Walk the mall as much as you can tolerate.  o Keep a food diary. o Weigh yourself daily. o Walk for 15 minutes 3 days per week. o Bellot at home more often and eat out less.  If life happens and you go back to old habits, it is okay.  Just start over. You can do it!   If you experience chest pain, get short of breath, or tired during the exercise, please stop immediately and inform your doctor.   Recommendations For Diabetic/Prediabetic Patients:   -  Take medications as prescribed  -  Recommend Dr Joel Fuhrman's book "The End of Diabetes "  And "The End of Dieting"- Can get at  www.Amazon.com and encourage also get the Audio CD book  - AVOID Animal products, ie. Meat - red/white, Poultry and Dairy/especially cheese - Exercise at least 5 times a week for 30 minutes or preferably daily.  - No Smoking - Drink less than 2 drinks a day.  - Monitor your feet for sores - Have yearly Eye Exams - Recommend annual Flu vaccine  - Recommend Pneumovax and Prevnar vaccines - Shingles Vaccine (Zostavax) if over 60 y.o.  Goals:   - BMI less than 24 - Fasting sugar less than   130 or less than 150 if tapering medicines to lose weight  - Systolic BP less than 130  - Diastolic BP less than 80 - Bad LDL Cholesterol less than 70 - Triglycerides less than 150  

## 2014-06-23 NOTE — Progress Notes (Signed)
Assessment and Plan:  Hypertension: Continue medication, monitor blood pressure at home. Continue DASH diet.  Reminder to go to the ER if any CP, SOB, nausea, dizziness, severe HA, changes vision/speech, left arm numbness and tingling, and jaw pain. Cholesterol: Continue diet and exercise. Check cholesterol.  Pre-diabetes-Continue diet and exercise. Check A1C Vitamin D Def- check level and continue medications.  Obesity with co morbidities- long discussion about weight loss, diet, and exercise Hypothyroidism-check TSH level, continue medications the same, reminded to take on an empty stomach 30-82mins before food, goal 1-2. ? OSA- snoring, crowded mouth, frequent awakenings,  will do at home sleep. Palpitations-? GERD- will try dexilant samples x 5 days, valsalva taught.   Continue diet and meds as discussed. Further disposition pending results of labs.  HPI 53 y.o. female  presents for 3 month follow up with hypertension, hyperlipidemia, prediabetes and vitamin D. Her blood pressure has been controlled at home, today their BP is BP: 128/82 mmHg She does not workout, she is 4 months s/p partial right knee pain. She denies chest pain, shortness of breath, dizziness.  She is not on cholesterol medication and denies myalgias. Her cholesterol is at goal. The cholesterol last visit was:   Lab Results  Component Value Date   CHOL 169 12/02/2013   HDL 44 12/02/2013   LDLCALC 106* 12/02/2013   TRIG 93 12/02/2013   CHOLHDL 3.8 12/02/2013   She has been working on diet and exercise for prediabetes, and denies paresthesia of the feet, polydipsia and polyuria. Last A1C in the office was:  Lab Results  Component Value Date   HGBA1C 5.7* 12/02/2013   Patient is on Vitamin D supplement.   Lab Results  Component Value Date   VD25OH 1 12/02/2013     She is on thyroid medication. Her medication was not changed last visit. Patient denies fatigue, weight changes, heat/cold intolerance, bowel/skin  changes or CVS symptoms  Lab Results  Component Value Date   TSH 2.442 12/02/2013  .  BMI is Body mass index is 35.85 kg/(m^2)., she is very frustrated with weight loss, she is very fatigued and does snore at night, has frequent awakenings.  Wt Readings from Last 3 Encounters:  06/23/14 222 lb (100.699 kg)  06/15/14 221 lb (100.245 kg)  02/15/14 226 lb 14 oz (102.91 kg)   + palpitations, states worse if she eats fast and better with burping. She will take metoprolol occ.   Current Medications:  Current Outpatient Prescriptions on File Prior to Visit  Medication Sig Dispense Refill  . Cholecalciferol (VITAMIN D3) 10000 UNITS capsule Take 10,000 Units by mouth every evening.    . Cyanocobalamin (VITAMIN B-12 PO) Take 1 tablet by mouth every evening.     . fluticasone (FLONASE) 50 MCG/ACT nasal spray Place 1 spray into both nostrils daily.    . hydrochlorothiazide (HYDRODIURIL) 25 MG tablet Take 25 mg by mouth daily as needed (fluid).     Marland Kitchen ibuprofen (ADVIL,MOTRIN) 800 MG tablet Take 800 mg by mouth every 8 (eight) hours as needed for moderate pain.    Marland Kitchen levothyroxine (SYNTHROID, LEVOTHROID) 100 MCG tablet Take 50-100 mcg by mouth every evening. 1/2 tablet on Tuesday, Thursday, and Saturday,  Whole tablet all other days    . metoprolol tartrate (LOPRESSOR) 25 MG tablet Take 25 mg by mouth daily as needed (heart palipations).     Marland Kitchen OVER THE COUNTER MEDICATION Take 1 tablet by mouth every morning. Equate brand allergy Rx    . OVER  THE COUNTER MEDICATION Take 1 tablet by mouth daily as needed (acid reducer). OTC Equate brand Acid Reducer    . predniSONE (DELTASONE) 20 MG tablet Take 3 tablets PO QDaily for 3 days, then take 2 tablets PO QDaily for 3 days, then take 1 tablet PO QDaily for 3 days 18 tablet 0   No current facility-administered medications on file prior to visit.   Medical History:  Past Medical History  Diagnosis Date  . Hypertension   . Hyperlipidemia   . Thyroid disease    . Vitamin D deficiency   . Palpitations   . PONV (postoperative nausea and vomiting)   . Pneumonia     hx  . Hypothyroidism   . UTI (lower urinary tract infection)   . GERD (gastroesophageal reflux disease)   . H/O hiatal hernia   . Arthritis   . Cancer     skin  . Dysrhythmia     palpitations occ; SVT 09/2013 s/p adenosine   Allergies:  Allergies  Allergen Reactions  . Darvocet [Propoxyphene N-Acetaminophen] Anaphylaxis  . Darvon [Propoxyphene Hcl] Anaphylaxis    Airway swelling   . Tamiflu [Oseltamivir Phosphate] Other (See Comments)    Increased blood pressure Hives      Review of Systems: [X]  = complains of  [ ]  = denies  General: Fatigue [x ] Fever [ ]  Chills [ ]  Weakness [ ]   Insomnia [x ] Eyes: Redness [ ]  Blurred vision [ ]  Diplopia [ ]   ENT: Congestion [ ]  Sinus Pain [ ]  Post Nasal Drip [ ]  Sore Throat [ ]  Earache [ ]   Cardiac: Chest pain/pressure [ ]  SOB [ ]  Orthopnea [ ]   Palpitations [ ]   Paroxysmal nocturnal dyspnea[ ]  Claudication [ ]  Edema [ ]   Pulmonary: Cough [ ]  Wheezing[ ]   SOB [ ]   Snoring [x ]  GI: Nausea [ ]  Vomiting[ ]  Dysphagia[ ]  Heartburn[ ]  Abdominal pain [ ]  Constipation [ ] ; Diarrhea [ ] ; BRBPR [ ]  Melena[ ]  GU: Hematuria[ ]  Dysuria [ ]  Nocturia[ ]  Urgency [ ]   Hesitancy [ ]  Discharge [ ]  Neuro: Headaches[ ]  Vertigo[ ]  Paresthesias[ ]  Spasm [ ]  Speech changes [ ]  Incoordination [ ]   Ortho: Arthritis [ ]  Joint pain knee [x ] Muscle pain [ ]  Joint swelling [ ]  Back Pain [ ]  Skin:  Rash [ ]   Pruritis [ ]  Change in skin lesion [ ]   Psych: Depression[ ]  Anxiety[ ]  Confusion [ ]  Memory loss [ ]   Heme/Lypmh: Bleeding [ ]  Bruising [ ]  Enlarged lymph nodes [ ]   Endocrine: Visual blurring [ ]  Paresthesia [ ]  Polyuria [ ]  Polydypsea [ ]    Heat/cold intolerance [ ]  Hypoglycemia [ ]   Family history- Review and unchanged Social history- Review and unchanged Physical Exam: BP 128/82 mmHg  Pulse 80  Temp(Src) 97.7 F (36.5 C)  Resp 16  Wt 222 lb  (100.699 kg) Wt Readings from Last 3 Encounters:  06/23/14 222 lb (100.699 kg)  06/15/14 221 lb (100.245 kg)  02/15/14 226 lb 14 oz (102.91 kg)   General Appearance: Well nourished, in no apparent distress. Eyes: PERRLA, EOMs, conjunctiva no swelling or erythema Sinuses: No Frontal/maxillary tenderness ENT/Mouth: Ext aud canals clear, TMs without erythema, bulging. No erythema, swelling, or exudate on post pharynx.  Tonsils not swollen or erythematous. Hearing normal.  Neck: Supple, thyroid normal.  Respiratory: Respiratory effort normal, BS equal bilaterally without rales, rhonchi, wheezing or stridor.  Cardio: RRR with no MRGs. Brisk  peripheral pulses without edema.  Abdomen: Soft, + BS, obese Non tender, no guarding, rebound, hernias, masses. Lymphatics: Non tender without lymphadenopathy.  Musculoskeletal: Full ROM, 5/5 strength, antalgic gait Skin: Warm, dry without rashes, lesions, ecchymosis.  Neuro: Cranial nerves intact. Normal muscle tone, no cerebellar symptoms. Sensation intact.  Psych: Awake and oriented X 3, normal affect, Insight and Judgment appropriate.    Vicie Mutters, PA-C 2:26 PM [facility]

## 2014-06-24 LAB — BASIC METABOLIC PANEL WITH GFR
BUN: 14 mg/dL (ref 6–23)
CALCIUM: 9.2 mg/dL (ref 8.4–10.5)
CO2: 23 mEq/L (ref 19–32)
Chloride: 105 mEq/L (ref 96–112)
Creat: 0.75 mg/dL (ref 0.50–1.10)
GFR, Est Non African American: >89 mL/min
Glucose, Bld: 89 mg/dL (ref 70–99)
Potassium: 4 mEq/L (ref 3.5–5.3)
SODIUM: 138 meq/L (ref 135–145)

## 2014-06-24 LAB — HEPATIC FUNCTION PANEL
ALT: 14 U/L (ref 0–35)
AST: 13 U/L (ref 0–37)
Albumin: 4 g/dL (ref 3.5–5.2)
Alkaline Phosphatase: 78 U/L (ref 39–117)
BILIRUBIN DIRECT: 0.1 mg/dL (ref 0.0–0.3)
Indirect Bilirubin: 0.2 mg/dL (ref 0.2–1.2)
TOTAL PROTEIN: 6.4 g/dL (ref 6.0–8.3)
Total Bilirubin: 0.3 mg/dL (ref 0.2–1.2)

## 2014-06-24 LAB — LIPID PANEL
CHOLESTEROL: 166 mg/dL (ref 0–200)
HDL: 53 mg/dL (ref >39)
LDL Cholesterol: 84 mg/dL (ref 0–99)
TRIGLYCERIDES: 144 mg/dL (ref <150)
Total CHOL/HDL Ratio: 3.1 Ratio
VLDL: 29 mg/dL (ref 0–40)

## 2014-06-24 LAB — MAGNESIUM: MAGNESIUM: 1.9 mg/dL (ref 1.5–2.5)

## 2014-06-24 LAB — HEMOGLOBIN A1C
HEMOGLOBIN A1C: 5.7 % — AB (ref <5.7)
MEAN PLASMA GLUCOSE: 117 mg/dL — AB (ref <117)

## 2014-06-24 LAB — VITAMIN D 25 HYDROXY (VIT D DEFICIENCY, FRACTURES): VIT D 25 HYDROXY: 37 ng/mL (ref 30–89)

## 2014-06-24 LAB — TSH: TSH: 2.879 u[IU]/mL (ref 0.350–4.500)

## 2014-07-20 ENCOUNTER — Encounter: Payer: Self-pay | Admitting: Physician Assistant

## 2014-07-20 ENCOUNTER — Other Ambulatory Visit: Payer: Self-pay | Admitting: Physician Assistant

## 2014-07-20 MED ORDER — LEVOTHYROXINE SODIUM 100 MCG PO TABS: ORAL_TABLET | ORAL | Status: DC

## 2014-08-02 ENCOUNTER — Ambulatory Visit (INDEPENDENT_AMBULATORY_CARE_PROVIDER_SITE_OTHER): Payer: 59 | Admitting: Physician Assistant

## 2014-08-02 ENCOUNTER — Encounter: Payer: Self-pay | Admitting: Physician Assistant

## 2014-08-02 VITALS — BP 146/94 | HR 80 | Temp 98.2°F | Resp 18 | Ht 65.75 in | Wt 223.0 lb

## 2014-08-02 DIAGNOSIS — J0100 Acute maxillary sinusitis, unspecified: Secondary | ICD-10-CM

## 2014-08-02 DIAGNOSIS — J40 Bronchitis, not specified as acute or chronic: Secondary | ICD-10-CM

## 2014-08-02 MED ORDER — ALBUTEROL SULFATE 108 (90 BASE) MCG/ACT IN AEPB: 2.0000 | INHALATION_SPRAY | Freq: Four times a day (QID) | RESPIRATORY_TRACT | Status: DC | PRN

## 2014-08-02 MED ORDER — AZITHROMYCIN 250 MG PO TABS: ORAL_TABLET | ORAL | Status: AC

## 2014-08-02 MED ORDER — PREDNISONE 20 MG PO TABS: ORAL_TABLET | ORAL | Status: AC

## 2014-08-02 MED ORDER — BENZONATATE 100 MG PO CAPS: 200.0000 mg | ORAL_CAPSULE | Freq: Three times a day (TID) | ORAL | Status: AC | PRN

## 2014-08-02 NOTE — Patient Instructions (Addendum)
-Take Z-Pak as prescribed. -Take Prednisone as prescribed for inflammation. -Take Tessalon Perles as prescribed for cough. -Continue Flonase -Continue Equate Allergy pill -While drinking any fluids, pinch and hold nose close and swallow, to help open eustachian tubes to drain fluid behind ear drums.  -Take Proair Inhaler as prescribed for shortness of breath.  If you are not feeling better in 10-14 days, then please call the office.    Acute Bronchitis Bronchitis is inflammation of the airways that extend from the windpipe into the lungs (bronchi). The inflammation often causes mucus to develop. This leads to a cough, which is the most common symptom of bronchitis.  In acute bronchitis, the condition usually develops suddenly and goes away over time, usually in a couple weeks. Smoking, allergies, and asthma can make bronchitis worse. Repeated episodes of bronchitis may cause further lung problems.  CAUSES Acute bronchitis is most often caused by the same virus that causes a cold. The virus can spread from person to person (contagious) through coughing, sneezing, and touching contaminated objects. SIGNS AND SYMPTOMS   Cough.   Fever.   Coughing up mucus.   Body aches.   Chest congestion.   Chills.   Shortness of breath.   Sore throat.  DIAGNOSIS  Acute bronchitis is usually diagnosed through a physical exam. Your health care provider will also ask you questions about your medical history. Tests, such as chest X-rays, are sometimes done to rule out other conditions.  TREATMENT  Acute bronchitis usually goes away in a couple weeks. Oftentimes, no medical treatment is necessary. Medicines are sometimes given for relief of fever or cough. Antibiotic medicines are usually not needed but may be prescribed in certain situations. In some cases, an inhaler may be recommended to help reduce shortness of breath and control the cough. A cool mist vaporizer may also be used to help  thin bronchial secretions and make it easier to clear the chest.  HOME CARE INSTRUCTIONS  Get plenty of rest.   Drink enough fluids to keep your urine clear or pale yellow (unless you have a medical condition that requires fluid restriction). Increasing fluids may help thin your respiratory secretions (sputum) and reduce chest congestion, and it will prevent dehydration.   Take medicines only as directed by your health care provider.  If you were prescribed an antibiotic medicine, finish it all even if you start to feel better.  Avoid smoking and secondhand smoke. Exposure to cigarette smoke or irritating chemicals will make bronchitis worse. If you are a smoker, consider using nicotine gum or skin patches to help control withdrawal symptoms. Quitting smoking will help your lungs heal faster.   Reduce the chances of another bout of acute bronchitis by washing your hands frequently, avoiding people with cold symptoms, and trying not to touch your hands to your mouth, nose, or eyes.   Keep all follow-up visits as directed by your health care provider.  SEEK MEDICAL CARE IF: Your symptoms do not improve after 1 week of treatment.  SEEK IMMEDIATE MEDICAL CARE IF:  You develop an increased fever or chills.   You have chest pain.   You have severe shortness of breath.  You have bloody sputum.   You develop dehydration.  You faint or repeatedly feel like you are going to pass out.  You develop repeated vomiting.  You develop a severe headache. MAKE SURE YOU:   Understand these instructions.  Will watch your condition.  Will get help right away if you are not  doing well or get worse. Document Released: 09/06/2004 Document Revised: 12/14/2013 Document Reviewed: 01/20/2013 [facility] Patient Information 2015 [place], [place]. This information is not intended to replace advice given to you by your health care provider. Make sure you discuss any questions you have with your health  care provider.

## 2014-08-02 NOTE — Progress Notes (Signed)
Subjective:    Patient ID: Sherry Dennis, female    DOB: [birth year], 53 y.o.   MRN: [id number]  Otalgia  There is pain in the left ear. This is a new (yesterday morning) problem. The problem occurs constantly. The problem has been unchanged. Maximum temperature: did not check temp at home. Associated symptoms include coughing, headaches, rhinorrhea and a sore throat. Pertinent negatives include no ear discharge or rash. Treatments tried: Equate [employer] Allergy pill. The treatment provided no relief.  Cough This is a new problem. Episode onset: yesterday. The problem has been unchanged. The problem occurs constantly. Cough characteristics: Productive with clear mucus. Associated symptoms include ear pain, headaches, postnasal drip, rhinorrhea, a sore throat, shortness of breath and wheezing. Pertinent negatives include no chills, fever or rash. Risk factors for lung disease include smoking/tobacco exposure (Current smoker- Did quit last year and was smoking 1.5ppd before quitting.  She states she started smoking 10 cigarettes per day due to death in family.). She has tried nothing for the symptoms. Her past medical history is significant for environmental allergies.  GFR= >89 on 06/23/14 Review of Systems  Constitutional: Positive for fatigue. Negative for fever, chills and diaphoresis.  HENT: Positive for congestion, ear pain, postnasal drip, rhinorrhea, sinus pressure, sore throat and voice change. Negative for ear discharge and trouble swallowing.   Eyes: Negative.   Respiratory: Positive for cough, chest tightness, shortness of breath and wheezing.        Clear production with cough  Cardiovascular: Negative.   Gastrointestinal: Negative.   Genitourinary: Negative.   Skin: Negative.  Negative for rash.  Allergic/Immunologic: Positive for environmental allergies.  Neurological: Positive for dizziness, light-headedness and headaches.  Psychiatric/Behavioral: Negative.    Past Medical History   Diagnosis Date  . Hypertension   . Hyperlipidemia   . Thyroid disease   . Vitamin D deficiency   . Palpitations   . PONV (postoperative nausea and vomiting)   . Pneumonia     hx  . Hypothyroidism   . UTI (lower urinary tract infection)   . GERD (gastroesophageal reflux disease)   . H/O hiatal hernia   . Arthritis   . Cancer     skin  . Dysrhythmia     palpitations occ; SVT 09/2013 s/p adenosine   Current Outpatient Prescriptions on File Prior to Visit  Medication Sig Dispense Refill  . fluticasone (FLONASE) 50 MCG/ACT nasal spray Place 1 spray into both nostrils daily.    Marland Kitchen ibuprofen (ADVIL,MOTRIN) 800 MG tablet Take 800 mg by mouth every 8 (eight) hours as needed for moderate pain.    Marland Kitchen levothyroxine (SYNTHROID, LEVOTHROID) 100 MCG tablet 1/2 tablet on Tuesday, Thursday, and Saturday,  Whole tablet all other days OR take as directed 90 tablet 0  . OVER THE COUNTER MEDICATION Take 1 tablet by mouth every morning. Equate brand allergy Rx    . OVER THE COUNTER MEDICATION Take 1 tablet by mouth daily as needed (acid reducer). OTC Equate brand Acid Reducer    . hydrochlorothiazide (HYDRODIURIL) 25 MG tablet Take 25 mg by mouth daily as needed (fluid).     . metoprolol tartrate (LOPRESSOR) 25 MG tablet Take 25 mg by mouth daily as needed (heart palipations).      No current facility-administered medications on file prior to visit.   Allergies  Allergen Reactions  . Darvocet [Propoxyphene N-Acetaminophen] Anaphylaxis  . Darvon [Propoxyphene Hcl] Anaphylaxis    Airway swelling   . Tamiflu [Oseltamivir Phosphate] Other (See Comments)  Increased blood pressure Hives      BP 146/94 mmHg  Pulse 80  Temp(Src) 98.2 F (36.8 C) (Temporal)  Resp 18  Ht 5' 5.75" (1.67 m)  Wt 223 lb (101.152 kg)  BMI 36.27 kg/m2  SpO2 97% Wt Readings from Last 3 Encounters:  08/02/14 223 lb (101.152 kg)  06/23/14 222 lb (100.699 kg)  06/15/14 221 lb (100.245 kg)   Objective:   Physical Exam   Constitutional: She is oriented to person, place, and time. She appears well-developed and well-nourished. She has a sickly appearance. No distress.  HENT:  Head: Normocephalic.  Right Ear: External ear and ear canal normal. Tympanic membrane is bulging.  Left Ear: External ear and ear canal normal. Tympanic membrane is bulging.  Nose: Mucosal edema and rhinorrhea present. Right sinus exhibits maxillary sinus tenderness. Right sinus exhibits no frontal sinus tenderness. Left sinus exhibits maxillary sinus tenderness. Left sinus exhibits no frontal sinus tenderness.  Mouth/Throat: Uvula is midline and mucous membranes are normal. Mucous membranes are not pale and not dry. No trismus in the jaw. No uvula swelling. Posterior oropharyngeal erythema present. No oropharyngeal exudate, posterior oropharyngeal edema or tonsillar abscesses.  TMs bulging with clear fluid and normal cones of light bilaterally.  TMs non-erythematous and non-edematous bilaterally. Turbinates erythematous bilaterally.  Eyes: Conjunctivae and lids are normal. Pupils are equal, round, and reactive to light. Right eye exhibits no discharge. Left eye exhibits no discharge. No scleral icterus.  Neck: Trachea normal, normal range of motion and phonation normal. Neck supple. No tracheal tenderness present. No tracheal deviation present.  Cardiovascular: Normal rate, regular rhythm, S1 normal, S2 normal, normal heart sounds, intact distal pulses and normal pulses.  Exam reveals no gallop, no distant heart sounds and no friction rub.   No murmur heard. Pulmonary/Chest: Effort normal. No stridor. No respiratory distress. She has no decreased breath sounds. She has wheezes. She has no rhonchi. She has no rales. She exhibits no tenderness.  Wheezing in all lung fields.  Abdominal: Soft. Bowel sounds are normal. There is no tenderness. There is no rebound and no guarding.  Lymphadenopathy:  No tenderness or LAD.  Neurological: She is alert  and oriented to person, place, and time. Gait normal.  Skin: Skin is warm, dry and intact. No rash noted. She is not diaphoretic.  Psychiatric: She has a normal mood and affect. Her speech is normal and behavior is normal. Judgment and thought content normal. Cognition and memory are normal.  Vitals reviewed.  Assessment & Plan:  1. Bronchitis and Acute Maxillary Sinusitis -Take Z-Pak as prescribed- azithromycin (ZITHROMAX) 250 MG tablet; Take 2 tablets PO on day 1, then 1 tablet PO Q24H x 4 days  Dispense: 6 tablet; Refill: 1 - Take Prednisone as prescribed for inflammation- predniSONE (DELTASONE) 20 MG tablet; Take 3 tablets PO QDaily for 3 days, then take 2 tablets PO QDaily for 3 days, then take 1 tablet PO QDaily for 3 days  Dispense: 18 tablet; Refill: 0 -Take Tessalon Perles as prescribed for cough- benzonatate (TESSALON PERLES) 100 MG capsule; Take 2 capsules (200 mg total) by mouth 3 (three) times daily as needed for cough (Max: 600mg  per day (6 capsules per day)).  Dispense: 120 capsule; Refill: 0 -Take Proair as prescribed for wheezing- Albuterol Sulfate (PROAIR RESPICLICK) 175 (90 BASE) MCG/ACT AEPB; Inhale 2 puffs into the lungs every 6 (six) hours as needed.  Dispense: 1 each; Refill: 1 -Continue Flonase as prescribed. -Continue Equate Brand Allergy Pill  Discussed medication effects and SE's.  Pt agreed to treatment plan. If you are not feeling better in 10-14 days, then please call the office. Please keep your physical appt on 12/08/14.  Evyn Putzier, Stephani Police, PA-C 2:54 PM [facility]

## 2014-08-31 DIAGNOSIS — M179 Osteoarthritis of knee, unspecified: Secondary | ICD-10-CM | POA: Insufficient documentation

## 2014-08-31 DIAGNOSIS — M1710 Unilateral primary osteoarthritis, unspecified knee: Secondary | ICD-10-CM | POA: Insufficient documentation

## 2014-10-05 ENCOUNTER — Other Ambulatory Visit: Payer: Self-pay | Admitting: Physician Assistant

## 2014-10-19 ENCOUNTER — Ambulatory Visit: Payer: Self-pay | Admitting: Internal Medicine

## 2014-10-25 ENCOUNTER — Encounter: Payer: Self-pay | Admitting: Emergency Medicine

## 2014-12-08 ENCOUNTER — Ambulatory Visit (INDEPENDENT_AMBULATORY_CARE_PROVIDER_SITE_OTHER): Payer: 59 | Admitting: Internal Medicine

## 2014-12-08 ENCOUNTER — Encounter: Payer: Self-pay | Admitting: Internal Medicine

## 2014-12-08 VITALS — BP 130/88 | HR 74 | Temp 98.0°F | Resp 18 | Ht 66.0 in | Wt 228.0 lb

## 2014-12-08 DIAGNOSIS — Z720 Tobacco use: Secondary | ICD-10-CM

## 2014-12-08 DIAGNOSIS — R002 Palpitations: Secondary | ICD-10-CM

## 2014-12-08 DIAGNOSIS — E669 Obesity, unspecified: Secondary | ICD-10-CM

## 2014-12-08 DIAGNOSIS — E079 Disorder of thyroid, unspecified: Secondary | ICD-10-CM

## 2014-12-08 DIAGNOSIS — Z130 Encounter for screening for diseases of the blood and blood-forming organs and certain disorders involving the immune mechanism: Secondary | ICD-10-CM

## 2014-12-08 DIAGNOSIS — I10 Essential (primary) hypertension: Secondary | ICD-10-CM

## 2014-12-08 DIAGNOSIS — R9431 Abnormal electrocardiogram [ECG] [EKG]: Secondary | ICD-10-CM

## 2014-12-08 DIAGNOSIS — E785 Hyperlipidemia, unspecified: Secondary | ICD-10-CM

## 2014-12-08 DIAGNOSIS — Z79899 Other long term (current) drug therapy: Secondary | ICD-10-CM

## 2014-12-08 DIAGNOSIS — F17200 Nicotine dependence, unspecified, uncomplicated: Secondary | ICD-10-CM | POA: Insufficient documentation

## 2014-12-08 DIAGNOSIS — Z131 Encounter for screening for diabetes mellitus: Secondary | ICD-10-CM

## 2014-12-08 DIAGNOSIS — E559 Vitamin D deficiency, unspecified: Secondary | ICD-10-CM

## 2014-12-08 DIAGNOSIS — Z1212 Encounter for screening for malignant neoplasm of rectum: Secondary | ICD-10-CM

## 2014-12-08 LAB — CBC WITH DIFFERENTIAL/PLATELET
Basophils Absolute: 0 10*3/uL (ref 0.0–0.1)
Basophils Relative: 0 % (ref 0–1)
EOS ABS: 0.1 10*3/uL (ref 0.0–0.7)
Eosinophils Relative: 1 % (ref 0–5)
HCT: 36.3 % (ref 36.0–46.0)
HEMOGLOBIN: 12.4 g/dL (ref 12.0–15.0)
Lymphocytes Relative: 35 % (ref 12–46)
Lymphs Abs: 2.3 10*3/uL (ref 0.7–4.0)
MCH: 32.6 pg (ref 26.0–34.0)
MCHC: 34.2 g/dL (ref 30.0–36.0)
MCV: 95.5 fL (ref 78.0–100.0)
MONO ABS: 0.6 10*3/uL (ref 0.1–1.0)
MONOS PCT: 9 % (ref 3–12)
MPV: 10.6 fL (ref 8.6–12.4)
Neutro Abs: 3.7 10*3/uL (ref 1.7–7.7)
Neutrophils Relative %: 55 % (ref 43–77)
Platelets: 219 10*3/uL (ref 150–400)
RBC: 3.8 MIL/uL — ABNORMAL LOW (ref 3.87–5.11)
RDW: 13.3 % (ref 11.5–15.5)
WBC: 6.7 10*3/uL (ref 4.0–10.5)

## 2014-12-08 LAB — LIPID PANEL
CHOL/HDL RATIO: 4.5 ratio
Cholesterol: 187 mg/dL (ref 0–200)
HDL: 42 mg/dL — ABNORMAL LOW (ref >=46)
LDL Cholesterol: 116 mg/dL — ABNORMAL HIGH (ref 0–99)
Triglycerides: 144 mg/dL (ref <150)
VLDL: 29 mg/dL (ref 0–40)

## 2014-12-08 LAB — HEMOGLOBIN A1C
Hgb A1c MFr Bld: 5.9 % — ABNORMAL HIGH (ref <5.7)
Mean Plasma Glucose: 123 mg/dL — ABNORMAL HIGH (ref <117)

## 2014-12-08 LAB — BASIC METABOLIC PANEL WITH GFR
BUN: 20 mg/dL (ref 6–23)
CALCIUM: 9.5 mg/dL (ref 8.4–10.5)
CO2: 24 mEq/L (ref 19–32)
Chloride: 103 mEq/L (ref 96–112)
Creat: 0.93 mg/dL (ref 0.50–1.10)
GFR, EST NON AFRICAN AMERICAN: 71 mL/min
GFR, Est African American: 82 mL/min
Glucose, Bld: 95 mg/dL (ref 70–99)
POTASSIUM: 3.9 meq/L (ref 3.5–5.3)
SODIUM: 136 meq/L (ref 135–145)

## 2014-12-08 LAB — IRON AND TIBC
%SAT: 27 % (ref 20–55)
IRON: 93 ug/dL (ref 42–145)
TIBC: 339 ug/dL (ref 250–470)
UIBC: 246 ug/dL (ref 125–400)

## 2014-12-08 LAB — TSH: TSH: 75.392 u[IU]/mL — ABNORMAL HIGH (ref 0.350–4.500)

## 2014-12-08 LAB — HEPATIC FUNCTION PANEL
ALK PHOS: 71 U/L (ref 39–117)
ALT: 15 U/L (ref 0–35)
AST: 16 U/L (ref 0–37)
Albumin: 4.3 g/dL (ref 3.5–5.2)
Bilirubin, Direct: 0.1 mg/dL (ref 0.0–0.3)
Indirect Bilirubin: 0.2 mg/dL (ref 0.2–1.2)
Total Bilirubin: 0.3 mg/dL (ref 0.2–1.2)
Total Protein: 7.1 g/dL (ref 6.0–8.3)

## 2014-12-08 LAB — VITAMIN B12: Vitamin B-12: 262 pg/mL (ref 211–911)

## 2014-12-08 LAB — MAGNESIUM: MAGNESIUM: 2.2 mg/dL (ref 1.5–2.5)

## 2014-12-08 NOTE — Progress Notes (Signed)
Patient ID: Sherry Dennis, female   DOB: 1961-06-09, 54 y.o.   MRN: [id number]  Complete Physical  Assessment and Plan:   1. Essential hypertension -DASH diet - Urinalysis, Routine w reflex microscopic - Microalbumin / creatinine urine ratio - EKG 12-Lead - [place], RETROPERITNL ABD,  LTD  2. Thyroid disease -change to levothyroxine when she finishes up synthroid - TSH  3. Hyperlipidemia  - Lipid panel  4. Vitamin D deficiency -cont supplement - Vit D  25 hydroxy (rtn osteoporosis monitoring)  5. Obesity -diet and exercise  6. Palpitations -metoprolol prn   7. Screening for rectal cancer -need to schedule colonoscopy - POC Hemoccult Bld/Stl (3-Cd Home Screen); Future  8. Tobacco abuse -quit smoking -patient not ready to stop - DG Chest 2 View; Future  9. Screening for deficiency anemia  - Iron and TIBC - Vitamin B12  10. Nonspecific abnormal electrocardiogram (ECG) (EKG) -new T wave inversion -several risk factors including obesity, smoking - Ambulatory referral to Cardiology  11. Medication management  - CBC with Differential/Platelet - BASIC METABOLIC PANEL WITH GFR - Hepatic function panel - Magnesium  12. Screening for diabetes mellitus  - Hemoglobin A1c - Insulin, random    Discussed med's effects and SE's. Screening labs and tests as requested with regular follow-up as recommended.  HPI  54 y.o. female  presents for a complete physical.  Her blood pressure has been controlled at home, today their BP is BP: 130/88 mmHg.  She does workout. Some crunches, dips, and biking.   She denies chest pain, shortness of breath, dizziness. She does report occasional dizzy spells and they come and go.  She feels these after eating.  She has them more during work and the day time.  She feels like sometime it feels like a swimmy headed feeling.    She is not on cholesterol medication and denies myalgias. Her cholesterol is at goal. The cholesterol last visit was:   Lab Results  Component Value Date   CHOL 166 06/23/2014   HDL 53 06/23/2014   LDLCALC 84 06/23/2014   TRIG 144 06/23/2014   CHOLHDL 3.1 06/23/2014  .  She has been working on diet and exercise for prediabetes, she is on bASA, she is not on ACE/ARB and denies hypoglycemia , nausea, paresthesia of the feet, polydipsia, polyuria, visual disturbances and vomiting. Last A1C in the office was:  Lab Results  Component Value Date   HGBA1C 5.7* 06/23/2014    Patient is on Vitamin D supplement.   Lab Results  Component Value Date   VD25OH 37 06/23/2014       Current Medications:  Current Outpatient Prescriptions on File Prior to Visit  Medication Sig Dispense Refill  . Albuterol Sulfate (PROAIR RESPICLICK) 623 (90 BASE) MCG/ACT AEPB Inhale 2 puffs into the lungs every 6 (six) hours as needed. 1 each 1  . fluticasone (FLONASE) 50 MCG/ACT nasal spray Place 1 spray into both nostrils daily.    . hydrochlorothiazide (HYDRODIURIL) 25 MG tablet Take 25 mg by mouth daily as needed (fluid).     Marland Kitchen ibuprofen (ADVIL,MOTRIN) 800 MG tablet Take 800 mg by mouth every 8 (eight) hours as needed for moderate pain.    . metoprolol tartrate (LOPRESSOR) 25 MG tablet Take 25 mg by mouth daily as needed (heart palipations).     Marland Kitchen OVER THE COUNTER MEDICATION Take 1 tablet by mouth every morning. Equate brand allergy Rx    . OVER THE COUNTER MEDICATION Take 1 tablet by  mouth daily as needed (acid reducer). OTC Equate brand Acid Reducer    . SYNTHROID 100 MCG tablet TAKE ONE-HALF TABLETY BY MOUTH ON TUESDAY, THURSDAY , AND SATURDAY, WHOLE TABLET ALL OTHER DAYS OR TAKE AS DIRECTED 90 tablet 1   No current facility-administered medications on file prior to visit.    Health Maintenance:   Immunization History  Administered Date(s) Administered  . PPD Test 12/02/2013  . Pneumococcal Polysaccharide-23 04/08/2012  . Td 10/16/2004    Tetanus: due today Colonoscopy: She has not had a colonoscopy at this time.    Last Dental Exam: Dr. Burnard Bunting Last Eye Exam: [facility]  Patient Care Team: Unk Pinto, MD as PCP - General (Internal Medicine) Jari Pigg, MD as Consulting Physician (Dermatology) Elsie Saas, MD as Consulting Physician (Orthopedic Surgery) Kathie Rhodes, MD as Consulting Physician (Urology) Clarene Essex, MD as Consulting Physician (Gastroenterology) Gaynelle Arabian, MD as Consulting Physician (Orthopedic Surgery)  Allergies:  Allergies  Allergen Reactions  . Darvocet [Propoxyphene N-Acetaminophen] Anaphylaxis  . Darvon [Propoxyphene Hcl] Anaphylaxis    Airway swelling   . Tamiflu [Oseltamivir Phosphate] Other (See Comments)    Increased blood pressure Hives     Medical History:  Past Medical History  Diagnosis Date  . Hypertension   . Hyperlipidemia   . Thyroid disease   . Vitamin D deficiency   . Palpitations   . PONV (postoperative nausea and vomiting)   . Pneumonia     hx  . Hypothyroidism   . UTI (lower urinary tract infection)   . GERD (gastroesophageal reflux disease)   . H/O hiatal hernia   . Arthritis   . Cancer     skin  . Dysrhythmia     palpitations occ; SVT 09/2013 s/p adenosine    Surgical History:  Past Surgical History  Procedure Laterality Date  . Carpal tunnel release Right 1990  . Tubal ligation  1987  . Knee surgery Right 2013  . Bladder repair  1993    holes,  mesh 10  . Endometrial ablation  2009  . Knee surgery Left 01/2014  . Skin surgery  2014    [facility]  . Replacement unicondylar joint knee Right 02/15/2014  . Partial knee arthroplasty Right 02/15/2014    Procedure: RIGHT UNICOMPARTMENTAL KNEE (MEDIAL COMPARTMENT);  Surgeon: Vickey Huger, MD;  Location: [place];  Service: Orthopedics;  Laterality: Right;    Family History:  Family History  Problem Relation Age of Onset  . Hypertension Mother   . Atrial fibrillation Mother   . Diabetes Father   . Emphysema Father   . COPD Father   . Cancer Paternal Aunt     x 4 aunts, breast   . Stroke Maternal Grandmother   . Hyperlipidemia Maternal Grandfather   . Heart disease Maternal Grandfather   . Hyperlipidemia Paternal Grandmother   . Heart disease Paternal Grandmother   . Hyperlipidemia Paternal Grandfather   . Heart disease Paternal Grandfather     Social History:  History  Substance Use Topics  . Smoking status: Current Every Day Smoker -- 1.50 packs/day    Types: Cigarettes  . Smokeless tobacco: Not on file     Comment: Pt states d/c cigarretes and started using Vapors  . Alcohol Use: 0.0 oz/week    0 Standard drinks or equivalent per week     Comment: social occasionally    Review of Systems: Review of Systems  Constitutional: Negative for fever, chills and malaise/fatigue.  HENT: Positive for congestion. Negative for  ear pain, sore throat and tinnitus.   Eyes: Negative.   Respiratory: Negative for cough, shortness of breath and wheezing.   Cardiovascular: Positive for palpitations (controlled well on beta blocker). Negative for chest pain and leg swelling.  Gastrointestinal: Negative for heartburn, nausea, vomiting, abdominal pain, diarrhea, constipation, blood in stool and melena.  Genitourinary: Positive for urgency and frequency (Chronic secondary to bladder surgery). Negative for dysuria.  Musculoskeletal: Positive for joint pain.  Skin: Negative.   Neurological: Positive for dizziness. Negative for tingling, sensory change, focal weakness, loss of consciousness, weakness and headaches.  Psychiatric/Behavioral: Negative for depression. The patient is not nervous/anxious.     Physical Exam: Estimated body mass index is 36.82 kg/(m^2) as calculated from the following:   Height as of this encounter: 5\' 6"  (1.676 m).   Weight as of this encounter: 228 lb (103.42 kg). BP 130/88 mmHg  Pulse 74  Temp(Src) 98 F (36.7 C) (Temporal)  Resp 18  Ht 5\' 6"  (1.676 m)  Wt 228 lb (103.42 kg)  BMI 36.82 kg/m2  General Appearance: Well nourished well  developed, in no apparent distress.  Eyes: PERRLA, EOMs, conjunctiva no swelling or erythema ENT/Mouth: Ear canals normal without obstruction, swelling, erythema, or discharge.  TMs normal bilaterally with no erythema, bulging, retraction, or loss of landmark.  Oropharynx moist and clear with no exudate, erythema, or swelling.   Neck: Supple, thyroid normal. No bruits.  No cervical adenopathy Respiratory: Respiratory effort normal, Breath sounds clear A&P without wheeze, rhonchi, rales.   Cardio: RRR without murmurs, rubs or gallops. Brisk peripheral pulses without edema.  Chest: well healed surgical scars on the left breast, symmetric, with normal excursions Breasts: Symmetric, without lumps, nipple discharge, retractions.  Abdomen: Obese, Soft, nontender, no guarding, rebound, hernias, masses, or organomegaly.  Lymphatics: Non tender without lymphadenopathy.  Genitourinary:  Musculoskeletal: Full ROM all peripheral extremities,5/5 strength, and normal gait.  Skin: Warm, dry without rashes, lesions, ecchymosis. Neuro: Awake and oriented X 3, Cranial nerves intact, reflexes equal bilaterally. Normal muscle tone, no cerebellar symptoms. Sensation intact.  Psych:  normal affect, Insight and Judgment appropriate.   EKG: New T wave inversion in V4, V5 with some RSR patterns, new from last year.    AORTA SCAN: WNL   Over 40 minutes of exam, counseling, chart review and critical decision making was performed  FORCUCCI, Majesti Gambrell 2:27 PM [facility]

## 2014-12-08 NOTE — Patient Instructions (Signed)
Preventive Care for Adults A healthy lifestyle and preventive care can promote health and wellness. Preventive health guidelines for women include the following key practices.  A routine yearly physical is a good way to check with your health care provider about your health and preventive screening. It is a chance to share any concerns and updates on your health and to receive a thorough exam.  Visit your dentist for a routine exam and preventive care every 6 months. Brush your teeth twice a day and floss once a day. Good oral hygiene prevents tooth decay and gum disease.  The frequency of eye exams is based on your age, health, family medical history, use of contact lenses, and other factors. Follow your health care provider's recommendations for frequency of eye exams.  Eat a healthy diet. Foods like vegetables, fruits, whole grains, low-fat dairy products, and lean protein foods contain the nutrients you need without too many calories. Decrease your intake of foods high in solid fats, added sugars, and salt. Eat the right amount of calories for you.Get information about a proper diet from your health care provider, if necessary.  Regular physical exercise is one of the most important things you can do for your health. Most adults should get at least 150 minutes of moderate-intensity exercise (any activity that increases your heart rate and causes you to sweat) each week. In addition, most adults need muscle-strengthening exercises on 2 or more days a week.  Maintain a healthy weight. The body mass index (BMI) is a screening tool to identify possible weight problems. It provides an estimate of body fat based on height and weight. Your health care provider can find your BMI and can help you achieve or maintain a healthy weight.For adults 20 years and older:  A BMI below 18.5 is considered underweight.  A BMI of 18.5 to 24.9 is normal.  A BMI of 25 to 29.9 is considered overweight.  A BMI of  30 and above is considered obese.  Maintain normal blood lipids and cholesterol levels by exercising and minimizing your intake of saturated fat. Eat a balanced diet with plenty of fruit and vegetables. Blood tests for lipids and cholesterol should begin at age 22 and be repeated every 5 years. If your lipid or cholesterol levels are high, you are over 50, or you are at high risk for heart disease, you may need your cholesterol levels checked more frequently.Ongoing high lipid and cholesterol levels should be treated with medicines if diet and exercise are not working.  If you smoke, find out from your health care provider how to quit. If you do not use tobacco, do not start.  Lung cancer screening is recommended for adults aged 65-80 years who are at high risk for developing lung cancer because of a history of smoking. A yearly low-dose CT scan of the lungs is recommended for people who have at least a 30-pack-year history of smoking and are a current smoker or have quit within the past 15 years. A pack year of smoking is smoking an average of 1 pack of cigarettes a day for 1 year (for example: 1 pack a day for 30 years or 2 packs a day for 15 years). Yearly screening should continue until the smoker has stopped smoking for at least 15 years. Yearly screening should be stopped for people who develop a health problem that would prevent them from having lung cancer treatment.  High blood pressure causes heart disease and increases the risk of stroke.  Your blood pressure should be checked at least every 1 to 2 years. Ongoing high blood pressure should be treated with medicines if weight loss and exercise do not work.  If you are 60-63 years old, ask your health care provider if you should take aspirin to prevent strokes.  Diabetes screening involves taking a blood sample to check your fasting blood sugar level. This should be done once every 3 years, after age 34, if you are within normal weight and  without risk factors for diabetes. Testing should be considered at a younger age or be carried out more frequently if you are overweight and have at least 1 risk factor for diabetes.  Breast cancer screening is essential preventive care for women. You should practice "breast self-awareness." This means understanding the normal appearance and feel of your breasts and may include breast self-examination. Any changes detected, no matter how small, should be reported to a health care provider. Women in their 64s and 30s should have a clinical breast exam (CBE) by a health care provider as part of a regular health exam every 1 to 3 years. After age 12, women should have a CBE every year. Starting at age 60, women should consider having a mammogram (breast X-ray test) every year. Women who have a family history of breast cancer should talk to their health care provider about genetic screening. Women at a high risk of breast cancer should talk to their health care providers about having an MRI and a mammogram every year.  Breast cancer gene (BRCA)-related cancer risk assessment is recommended for women who have family members with BRCA-related cancers. BRCA-related cancers include breast, ovarian, tubal, and peritoneal cancers. Having family members with these cancers may be associated with an increased risk for harmful changes (mutations) in the breast cancer genes BRCA1 and BRCA2. Results of the assessment will determine the need for genetic counseling and BRCA1 and BRCA2 testing.  Routine pelvic exams to screen for cancer are no longer recommended for nonpregnant women who are considered low risk for cancer of the pelvic organs (ovaries, uterus, and vagina) and who do not have symptoms. Ask your health care provider if a screening pelvic exam is right for you.  If you have had past treatment for cervical cancer or a condition that could lead to cancer, you need Pap tests and screening for cancer for at least 20  years after your treatment. If Pap tests have been discontinued, your risk factors (such as having a new sexual partner) need to be reassessed to determine if screening should be resumed. Some women have medical problems that increase the chance of getting cervical cancer. In these cases, your health care provider may recommend more frequent screening and Pap tests.  Colorectal cancer can be detected and often prevented. Most routine colorectal cancer screening begins at the age of 107 years and continues through age 18 years. However, your health care provider may recommend screening at an earlier age if you have risk factors for colon cancer. On a yearly basis, your health care provider may provide home test kits to check for hidden blood in the stool. Use of a small camera at the end of a tube, to directly examine the colon (sigmoidoscopy or colonoscopy), can detect the earliest forms of colorectal cancer. Talk to your health care provider about this at age 62, when routine screening begins. Direct exam of the colon should be repeated every 5-10 years through age 47 years, unless early forms of pre-cancerous polyps  polyps or small growths are found.  Hepatitis C blood testing is recommended for all people born from 1945 through 1965 and any individual with known risks for hepatitis C.  Pra  Osteoporosis is a disease in which the bones lose minerals and strength with aging. This can result in serious bone fractures or breaks. The risk of osteoporosis can be identified using a bone density scan. Women ages 65 years and over and women at risk for fractures or osteoporosis should discuss screening with their health care providers. Ask your health care provider whether you should take a calcium supplement or vitamin D to reduce the rate of osteoporosis.  Menopause can be associated with physical symptoms and risks. Hormone replacement therapy is available to decrease symptoms and risks. You should talk to your  health care provider about whether hormone replacement therapy is right for you.  Use sunscreen. Apply sunscreen liberally and repeatedly throughout the day. You should seek shade when your shadow is shorter than you. Protect yourself by wearing long sleeves, pants, a wide-brimmed hat, and sunglasses year round, whenever you are outdoors.  Once a month, do a whole body skin exam, using a mirror to look at the skin on your back. Tell your health care provider of new moles, moles that have irregular borders, moles that are larger than a pencil eraser, or moles that have changed in shape or color.  Stay current with required vaccines (immunizations).  Influenza vaccine. All adults should be immunized every year.  Tetanus, diphtheria, and acellular pertussis (Td, Tdap) vaccine. Pregnant women should receive 1 dose of Tdap vaccine during each pregnancy. The dose should be obtained regardless of the length of time since the last dose. Immunization is preferred during the 27th-36th week of gestation. An adult who has not previously received Tdap or who does not know her vaccine status should receive 1 dose of Tdap. This initial dose should be followed by tetanus and diphtheria toxoids (Td) booster doses every 10 years. Adults with an unknown or incomplete history of completing a 3-dose immunization series with Td-containing vaccines should begin or complete a primary immunization series including a Tdap dose. Adults should receive a Td booster every 10 years.  Varicella vaccine. An adult without evidence of immunity to varicella should receive 2 doses or a second dose if she has previously received 1 dose. Pregnant females who do not have evidence of immunity should receive the first dose after pregnancy. This first dose should be obtained before leaving the health care facility. The second dose should be obtained 4-8 weeks after the first dose.  Human papillomavirus (HPV) vaccine. Females aged 13-26 years  who have not received the vaccine previously should obtain the 3-dose series. The vaccine is not recommended for use in pregnant females. However, pregnancy testing is not needed before receiving a dose. If a female is found to be pregnant after receiving a dose, no treatment is needed. In that case, the remaining doses should be delayed until after the pregnancy. Immunization is recommended for any person with an immunocompromised condition through the age of 26 years if she did not get any or all doses earlier. During the 3-dose series, the second dose should be obtained 4-8 weeks after the first dose. The third dose should be obtained 24 weeks after the first dose and 16 weeks after the second dose.  Zoster vaccine. One dose is recommended for adults aged 60 years or older unless certain conditions are present.  Measles, mumps, and rubella (  MMR) vaccine. Adults born before 1957 generally are considered immune to measles and mumps. Adults born in 1957 or later should have 1 or more doses of MMR vaccine unless there is a contraindication to the vaccine or there is laboratory evidence of immunity to each of the three diseases. A routine second dose of MMR vaccine should be obtained at least 28 days after the first dose for students attending postsecondary schools, health care workers, or international travelers. People who received inactivated measles vaccine or an unknown type of measles vaccine during 1963-1967 should receive 2 doses of MMR vaccine. People who received inactivated mumps vaccine or an unknown type of mumps vaccine before 1979 and are at high risk for mumps infection should consider immunization with 2 doses of MMR vaccine. For females of childbearing age, rubella immunity should be determined. If there is no evidence of immunity, females who are not pregnant should be vaccinated. If there is no evidence of immunity, females who are pregnant should delay immunization until after pregnancy.  Unvaccinated health care workers born before 1957 who lack laboratory evidence of measles, mumps, or rubella immunity or laboratory confirmation of disease should consider measles and mumps immunization with 2 doses of MMR vaccine or rubella immunization with 1 dose of MMR vaccine.  Pneumococcal 13-valent conjugate (PCV13) vaccine. When indicated, a person who is uncertain of her immunization history and has no record of immunization should receive the PCV13 vaccine. An adult aged 19 years or older who has certain medical conditions and has not been previously immunized should receive 1 dose of PCV13 vaccine. This PCV13 should be followed with a dose of pneumococcal polysaccharide (PPSV23) vaccine. The PPSV23 vaccine dose should be obtained at least 8 weeks after the dose of PCV13 vaccine. An adult aged 19 years or older who has certain medical conditions and previously received 1 or more doses of PPSV23 vaccine should receive 1 dose of PCV13. The PCV13 vaccine dose should be obtained 1 or more years after the last PPSV23 vaccine dose.    Pneumococcal polysaccharide (PPSV23) vaccine. When PCV13 is also indicated, PCV13 should be obtained first. All adults aged 65 years and older should be immunized. An adult younger than age 65 years who has certain medical conditions should be immunized. Any person who resides in a nursing home or long-term care facility should be immunized. An adult smoker should be immunized. People with an immunocompromised condition and certain other conditions should receive both PCV13 and PPSV23 vaccines. People with human immunodeficiency virus (HIV) infection should be immunized as soon as possible after diagnosis. Immunization during chemotherapy or radiation therapy should be avoided. Routine use of PPSV23 vaccine is not recommended for American Indians, [place] Natives, or people younger than 65 years unless there are medical conditions that require PPSV23 vaccine. When indicated,  people who have unknown immunization and have no record of immunization should receive PPSV23 vaccine. One-time revaccination 5 years after the first dose of PPSV23 is recommended for people aged 19-64 years who have chronic kidney failure, nephrotic syndrome, asplenia, or immunocompromised conditions. People who received 1-2 doses of PPSV23 before age 65 years should receive another dose of PPSV23 vaccine at age 65 years or later if at least 5 years have passed since the previous dose. Doses of PPSV23 are not needed for people immunized with PPSV23 at or after age 65 years.  Preventive Services / Frequency   Ages 40 to 64 years  Blood pressure check.  Lipid and cholesterol check.  Lung   cancer screening. / Every year if you are aged 55-80 years and have a 30-pack-year history of smoking and currently smoke or have quit within the past 15 years. Yearly screening is stopped once you have quit smoking for at least 15 years or develop a health problem that would prevent you from having lung cancer treatment.  Clinical breast exam.** / Every year after age 40 years.  BRCA-related cancer risk assessment.** / For women who have family members with a BRCA-related cancer (breast, ovarian, tubal, or peritoneal cancers).  Mammogram.** / Every year beginning at age 40 years and continuing for as long as you are in good health. Consult with your health care provider.  Pap test.** / Every 3 years starting at age 30 years through age 65 or 70 years with a history of 3 consecutive normal Pap tests.  HPV screening.** / Every 3 years from ages 30 years through ages 65 to 70 years with a history of 3 consecutive normal Pap tests.  Fecal occult blood test (FOBT) of stool. / Every year beginning at age 50 years and continuing until age 75 years. You may not need to do this test if you get a colonoscopy every 10 years.  Flexible sigmoidoscopy or colonoscopy.** / Every 5 years for a flexible sigmoidoscopy or  every 10 years for a colonoscopy beginning at age 50 years and continuing until age 75 years.  Hepatitis C blood test.** / For all people born from 1945 through 1965 and any individual with known risks for hepatitis C.  Skin self-exam. / Monthly.  Influenza vaccine. / Every year.  Tetanus, diphtheria, and acellular pertussis (Tdap/Td) vaccine.** / Consult your health care provider. Pregnant women should receive 1 dose of Tdap vaccine during each pregnancy. 1 dose of Td every 10 years.  Varicella vaccine.** / Consult your health care provider. Pregnant females who do not have evidence of immunity should receive the first dose after pregnancy.  Zoster vaccine.** / 1 dose for adults aged 60 years or older.  Pneumococcal 13-valent conjugate (PCV13) vaccine.** / Consult your health care provider.  Pneumococcal polysaccharide (PPSV23) vaccine.** / 1 to 2 doses if you smoke cigarettes or if you have certain conditions.  Meningococcal vaccine.** / Consult your health care provider.  Hepatitis A vaccine.** / Consult your health care provider.  Hepatitis B vaccine.** / Consult your health care provider. Screening for abdominal aortic aneurysm (AAA)  by ultrasound is recommended for people over 50 who have history of high blood pressure or who are current or former smokers.   

## 2014-12-09 ENCOUNTER — Other Ambulatory Visit: Payer: Self-pay | Admitting: Internal Medicine

## 2014-12-09 LAB — URINALYSIS, ROUTINE W REFLEX MICROSCOPIC
BILIRUBIN URINE: NEGATIVE
Glucose, UA: NEGATIVE mg/dL
Hgb urine dipstick: NEGATIVE
KETONES UR: NEGATIVE mg/dL
Leukocytes, UA: NEGATIVE
Nitrite: NEGATIVE
PROTEIN: NEGATIVE mg/dL
Specific Gravity, Urine: 1.017 (ref 1.005–1.030)
UROBILINOGEN UA: 0.2 mg/dL (ref 0.0–1.0)
pH: 5.5 (ref 5.0–8.0)

## 2014-12-09 LAB — MICROALBUMIN / CREATININE URINE RATIO
CREATININE, URINE: 107.8 mg/dL
MICROALB UR: 0.2 mg/dL (ref <2.0)
MICROALB/CREAT RATIO: 1.9 mg/g (ref 0.0–30.0)

## 2014-12-09 LAB — INSULIN, RANDOM: Insulin: 6.7 u[IU]/mL (ref 2.0–19.6)

## 2014-12-09 LAB — VITAMIN D 25 HYDROXY (VIT D DEFICIENCY, FRACTURES): Vit D, 25-Hydroxy: 23 ng/mL — ABNORMAL LOW (ref 30–100)

## 2014-12-09 MED ORDER — LEVOTHYROXINE SODIUM 150 MCG PO TABS: 150.0000 ug | ORAL_TABLET | Freq: Every day | ORAL | Status: DC

## 2014-12-13 ENCOUNTER — Other Ambulatory Visit: Payer: Self-pay | Admitting: Orthopaedic Surgery

## 2014-12-13 DIAGNOSIS — M25572 Pain in left ankle and joints of left foot: Secondary | ICD-10-CM

## 2014-12-23 ENCOUNTER — Other Ambulatory Visit: Payer: Self-pay

## 2014-12-23 ENCOUNTER — Other Ambulatory Visit: Payer: Self-pay | Admitting: *Deleted

## 2014-12-23 DIAGNOSIS — E039 Hypothyroidism, unspecified: Secondary | ICD-10-CM

## 2014-12-23 MED ORDER — LEVOTHYROXINE SODIUM 150 MCG PO TABS: 150.0000 ug | ORAL_TABLET | Freq: Every day | ORAL | Status: DC

## 2014-12-25 ENCOUNTER — Ambulatory Visit
Admission: RE | Admit: 2014-12-25 | Discharge: 2014-12-25 | Disposition: A | Discharge status: Home / Self Care | Payer: Self-pay | Source: Ambulatory Visit | Attending: Orthopaedic Surgery | Admitting: Orthopaedic Surgery

## 2014-12-25 DIAGNOSIS — M25572 Pain in left ankle and joints of left foot: Secondary | ICD-10-CM

## 2015-01-03 ENCOUNTER — Encounter: Payer: Self-pay | Admitting: Physician Assistant

## 2015-01-03 ENCOUNTER — Ambulatory Visit (INDEPENDENT_AMBULATORY_CARE_PROVIDER_SITE_OTHER): Payer: 59 | Admitting: Physician Assistant

## 2015-01-03 VITALS — BP 112/92 | HR 125 | Ht 66.0 in | Wt 209.0 lb

## 2015-01-03 DIAGNOSIS — R9431 Abnormal electrocardiogram [ECG] [EKG]: Secondary | ICD-10-CM | POA: Diagnosis not present

## 2015-01-03 DIAGNOSIS — R0789 Other chest pain: Secondary | ICD-10-CM

## 2015-01-03 DIAGNOSIS — I471 Supraventricular tachycardia: Secondary | ICD-10-CM

## 2015-01-03 DIAGNOSIS — I10 Essential (primary) hypertension: Secondary | ICD-10-CM | POA: Diagnosis not present

## 2015-01-03 DIAGNOSIS — E785 Hyperlipidemia, unspecified: Secondary | ICD-10-CM | POA: Diagnosis not present

## 2015-01-03 MED ORDER — DILTIAZEM HCL ER COATED BEADS 120 MG PO CP24: 120.0000 mg | ORAL_CAPSULE | Freq: Every day | ORAL | Status: DC

## 2015-01-03 NOTE — Patient Instructions (Signed)
Medication Instructions:  1. START CARDIZEM CD 120 MG 1 TABLET DAILY  2. TAKE METOPROLOL TARTRATE 12.5 MG AS NEEDED FOR BREAK THROUGH SVT  Labwork: NONE  Testing/Procedures: 1. Your physician has recommended that you wear an 30 DAY event monitor. Event monitors are medical devices that record the heart's electrical activity. Doctors most often [place] these monitors to diagnose arrhythmias. Arrhythmias are problems with the speed or rhythm of the heartbeat. The monitor is a small, portable device. You can wear one while you do your normal daily activities. This is usually used to diagnose what is causing palpitations/syncope (passing out).  2. Your physician has requested that you have en exercise stress myoview. For further information please visit [email or web address]. Please follow instruction sheet, as given.  Follow-Up: 1. DR. Johnsie Cancel IN 3 MONTHS   2. YOU ARE BEING REFERRED ELECTROPHYSIOLOGY DX SVT  Any Other Special Instructions Will Be Listed Below (If Applicable).

## 2015-01-03 NOTE — Progress Notes (Signed)
Cardiology Office Note   Date:  01/03/2015   ID:  Sherry Dennis, DOB 1961-01-23, MRN [id number]  PCP:  Alesia Richards, MD  Cardiologist:  Dr. Jenkins Rouge     Chief Complaint  Patient presents with  . Abnormal ECG     History of Present Illness: Sherry Dennis is a 54 y.o. female with a hx of palpitations, HTN, HL, hypothyroidism.  Initially seen by Dr. Jenkins Rouge 08/2013.  She was to FU prn.  She was then seen in the ED in 22/105 with SVT with HR of 160.  She converted to NSR with Adenosine 6 mg.  She was placed on beta-blocker and DC from the ED.   Patient was recently seen by PCP for CPE.  ECG was noted to be abnormal with new TWI.  She is referred back for evaluation.  In the office today, she is in SVT with a heart rate of 125. She started noticing palpitations this morning around 9 AM. She typically has chest pressure associated with this. She denies exertional chest pain. She has chronic dyspnea with exertion. This is unchanged for several years. She describes NYHA 2b symptoms. She denies orthopnea or PND. She has chronic LE edema without significant change. She denies syncope. She describes a spinning sensation at times. She typically gets short of breath with palpitations as well. She denies any other associated symptoms.   Studies/Reports Reviewed Today:  Echo 10/01/13 - Mild LVH. EF 55% to 60%. Grade 1 diastolic dysfunction - Left atrium: The atrium was normal in size.    Past Medical History  Diagnosis Date  . Hypertension   . Hyperlipidemia   . Thyroid disease   . Vitamin D deficiency   . Palpitations   . PONV (postoperative nausea and vomiting)   . Pneumonia     hx  . Hypothyroidism   . UTI (lower urinary tract infection)   . GERD (gastroesophageal reflux disease)   . H/O hiatal hernia   . Arthritis   . Cancer     skin  . Dysrhythmia     palpitations occ; SVT 09/2013 s/p adenosine    Past Surgical History  Procedure Laterality Date  . Carpal  tunnel release Right 1990  . Tubal ligation  1987  . Knee surgery Right 2013  . Bladder repair  1993    holes,  mesh 10  . Endometrial ablation  2009  . Knee surgery Left 01/2014  . Skin surgery  2014    [facility]  . Replacement unicondylar joint knee Right 02/15/2014  . Partial knee arthroplasty Right 02/15/2014    Procedure: RIGHT UNICOMPARTMENTAL KNEE (MEDIAL COMPARTMENT);  Surgeon: Vickey Huger, MD;  Location: [facility];  Service: Orthopedics;  Laterality: Right;     Current Outpatient Prescriptions  Medication Sig Dispense Refill  . fluticasone (FLONASE) 50 MCG/ACT nasal spray Place 1 spray into both nostrils daily.    . hydrochlorothiazide (HYDRODIURIL) 25 MG tablet Take 25 mg by mouth daily as needed (fluid).     Marland Kitchen ibuprofen (ADVIL,MOTRIN) 800 MG tablet Take 800 mg by mouth every 8 (eight) hours as needed for moderate pain.    Marland Kitchen levothyroxine (SYNTHROID) 150 MCG tablet Take 1 tablet (150 mcg total) by mouth daily. 90 tablet 0  . metoprolol tartrate (LOPRESSOR) 25 MG tablet Take 12.5 mg by mouth daily as needed.    . metroNIDAZOLE (METROGEL) 0.75 % gel Apply 1 application topically 2 (two) times daily.     Marland Kitchen OVER THE  COUNTER MEDICATION Take 1 tablet by mouth every morning. Equate brand allergy Rx    . OVER THE COUNTER MEDICATION Take 1 tablet by mouth daily as needed (acid reducer). OTC Equate brand Acid Reducer    . diltiazem (CARDIZEM CD) 120 MG 24 hr capsule Take 1 capsule (120 mg total) by mouth daily. 30 capsule 11   No current facility-administered medications for this visit.    Allergies:   Darvocet; Darvon; and Tamiflu    Social History:  The patient  reports that she has been smoking Cigarettes.  She has been smoking about 1.50 packs per day. She does not have any smokeless tobacco history on file. She reports that she drinks alcohol. She reports that she does not use illicit drugs.   Family History:  The patient's family history includes Atrial fibrillation in her mother; COPD in  her father; Cancer in her paternal aunt; Diabetes in her father; Emphysema in her father; Heart attack in her maternal grandmother; Heart disease in her maternal grandfather, paternal grandfather, and paternal grandmother; Hyperlipidemia in her maternal grandfather, paternal grandfather, and paternal grandmother; Hypertension in her mother; Stroke in her maternal grandmother.    ROS:   Please see the history of present illness.   Review of Systems  Constitution: Positive for malaise/fatigue.  HENT: Positive for headaches.   Cardiovascular: Positive for chest pain, dyspnea on exertion and irregular heartbeat.  Hematologic/Lymphatic: Bruises/bleeds easily.  Neurological: Positive for dizziness.  All other systems reviewed and are negative.    PHYSICAL EXAM: VS:  BP 112/92 mmHg  Pulse 125  Ht 5\' 6"  (1.676 m)  Wt 209 lb (94.802 kg)  BMI 33.75 kg/m2    Wt Readings from Last 3 Encounters:  01/03/15 209 lb (94.802 kg)  12/08/14 228 lb (103.42 kg)  08/02/14 223 lb (101.152 kg)     GEN: Well nourished, well developed, in no acute distress HEENT: normal Neck: no JVD,  no masses Cardiac:  Normal S1/S2, RRR; no murmur ,  no rubs or gallops, no edema  Respiratory:  clear to auscultation bilaterally, no wheezing, rhonchi or rales. GI: soft, nontender, nondistended, + BS MS: no deformity or atrophy Skin: warm and dry  Neuro:  CNs II-XII intact, Strength and sensation are intact Psych: Normal affect   EKG:  EKG is ordered today.  It demonstrates:   #1 SVT with HR 125;  #2 NSR, HR 72, LAD, PRWP, no significant change since last tracing.   Recent Labs: 12/08/2014: ALT 15; BUN 20; Creatinine 0.93; Hemoglobin 12.4; Magnesium 2.2; Platelets 219; Potassium 3.9; Sodium 136; TSH 75.392*    Lipid Panel    Component Value Date/Time   CHOL 187 12/08/2014 1500   TRIG 144 12/08/2014 1500   HDL 42* 12/08/2014 1500   CHOLHDL 4.5 12/08/2014 1500   VLDL 29 12/08/2014 1500   LDLCALC 116*  12/08/2014 1500      ASSESSMENT AND PLAN:  PSVT (paroxysmal supraventricular tachycardia):  The patient presented to the office today with SVT at a heart rate of 125. She was somewhat symptomatic with chest pressure. I reviewed her ECG today with Dr. Irish Lack (DOD). We discussed adjusting her medical therapy, getting an event monitor and referring her to electrophysiology. I hooked her up to the ECG machine and had her perform a vagal maneuver. She quickly converted to NSR with a heart rate of 72. Her symptoms of chest pressure completely abated at that point. The patient tells me she has significant problems with metoprolol when she  takes it as needed. It causes fairly significant fatigue.  -  Start Cardizem CD 120 mg daily  -  Continue with metoprolol tartrate 12.5 mg when necessary rapid palpitations  -  We reviewed how to do vagal maneuvers to treat her SVT. She can take metoprolol if this does not work.  -  Arrange 30 day event monitor.  -  Refer to EP.  Other chest pain:  Initially I felt that her CP was related to SVT. She did have a recent ECG with anterior TWI.  She does want to lose weight and wants to start an exercise program.  In the event EP recommends antiarrhythmic drug therapy, we would need to rule out ischemic heart disease.  Therefore I will arrange stress testing.  -  Schedule ETT-myoview.  Essential hypertension:  Fair control.  Change medications as noted.   Hyperlipidemia:  Managed by PCP.     Current medicines are reviewed at length with the patient today.  Concerns regarding medicines are as outlined above.  The following changes have been made:    Start Diltiazem CD 120 mg QD  Ok to take Metoprolol Tartrate 12.5 mg QD prn for palpitations.    Labs/ tests ordered today include:  Orders Placed This Encounter  Procedures  . Myocardial Perfusion Imaging  . Cardiac event monitor  . EKG 12-Lead    Disposition:   I spent > 40 minutes with the patient today.   Greater than 50% of the encounter was face to face time.  FU with Dr. Jenkins Rouge 3 mos.  Refer to EP as noted above.    Signed, Versie Starks, MHS 01/03/2015 5:32 PM    [facility] [address], [place], [place]  [address] Phone: [phone]; Fax: [phone]

## 2015-01-05 ENCOUNTER — Institutional Professional Consult (permissible substitution): Payer: 59 | Admitting: Internal Medicine

## 2015-01-17 ENCOUNTER — Telehealth (HOSPITAL_COMMUNITY): Payer: Self-pay | Admitting: *Deleted

## 2015-01-17 NOTE — Telephone Encounter (Signed)
Patient given detailed instructions per Myocardial Perfusion Study Information Sheet for test on 01/19/15 at 0730. Patient verbalized understanding. Sherry Dennis, Ranae Palms

## 2015-01-19 ENCOUNTER — Encounter: Payer: Self-pay | Admitting: Physician Assistant

## 2015-01-19 ENCOUNTER — Ambulatory Visit (HOSPITAL_COMMUNITY): Payer: 59 | Attending: Cardiology

## 2015-01-19 ENCOUNTER — Ambulatory Visit (INDEPENDENT_AMBULATORY_CARE_PROVIDER_SITE_OTHER): Payer: Commercial Managed Care - HMO

## 2015-01-19 VITALS — Ht 66.0 in | Wt 209.0 lb

## 2015-01-19 DIAGNOSIS — R0789 Other chest pain: Secondary | ICD-10-CM | POA: Diagnosis not present

## 2015-01-19 DIAGNOSIS — R0602 Shortness of breath: Secondary | ICD-10-CM

## 2015-01-19 DIAGNOSIS — I471 Supraventricular tachycardia: Secondary | ICD-10-CM | POA: Diagnosis not present

## 2015-01-19 LAB — MYOCARDIAL PERFUSION IMAGING
CHL CUP NUCLEAR SRS: 3
CHL CUP RESTING HR STRESS: 63 {beats}/min
LHR: 0.3
LV dias vol: 103 mL
LV sys vol: 41 mL
NUC STRESS EF: 60 %
Peak HR: 111 {beats}/min
SDS: 0
SSS: 3
TID: 1.02

## 2015-01-19 MED ORDER — TECHNETIUM TC 99M SESTAMIBI GENERIC - CARDIOLITE
11.0000 | Freq: Once | INTRAVENOUS | Status: AC | PRN
  Administered 2015-01-19: 11 via INTRAVENOUS

## 2015-01-19 MED ORDER — REGADENOSON 0.4 MG/5ML IV SOLN
0.4000 mg | Freq: Once | INTRAVENOUS | Status: AC
  Administered 2015-01-19: 0.4 mg via INTRAVENOUS

## 2015-01-19 MED ORDER — TECHNETIUM TC 99M SESTAMIBI GENERIC - CARDIOLITE
33.0000 | Freq: Once | INTRAVENOUS | Status: AC | PRN
  Administered 2015-01-19: 33 via INTRAVENOUS

## 2015-01-19 NOTE — Addendum Note (Signed)
Addended by: Starlyn Skeans A on: 01/19/2015 01:26 PM   Modules accepted: Miquel Dunn

## 2015-01-20 ENCOUNTER — Ambulatory Visit (INDEPENDENT_AMBULATORY_CARE_PROVIDER_SITE_OTHER): Payer: 59 | Admitting: Internal Medicine

## 2015-01-20 ENCOUNTER — Encounter: Payer: Self-pay | Admitting: Internal Medicine

## 2015-01-20 VITALS — BP 142/98 | HR 74 | Temp 98.4°F | Resp 18 | Ht 66.0 in | Wt 231.0 lb

## 2015-01-20 DIAGNOSIS — E669 Obesity, unspecified: Secondary | ICD-10-CM

## 2015-01-20 DIAGNOSIS — F4541 Pain disorder exclusively related to psychological factors: Secondary | ICD-10-CM

## 2015-01-20 MED ORDER — TOPIRAMATE 50 MG PO TABS: ORAL_TABLET | ORAL | Status: DC

## 2015-01-20 NOTE — Progress Notes (Signed)
   Subjective:    Patient ID: Sherry Dennis, female    DOB: 28-Feb-1961, 54 y.o.   MRN: [id number]  HPI  Patient presents to the office for evaluation of obesity.  She reports that she has not been able to work out due to problems with her achilles tendon.  She does report that she has not been using a heel insert.  She does see an orthopedist. She reports that she sees [place] [facility] [place] [facility] [place].  She reports that she has been having some success with changes in her diet.  She is eating more veggies.  She is not specifically on a diet currently.  She also reports that she is having headaches daily which last for several hours and cause her to have a lot of pain in a generalized pattern.  They generally go away with tylenol or ibuprofen.    Review of Systems  Constitutional: Negative for fever, chills and fatigue.  HENT: Negative for congestion, facial swelling, postnasal drip, rhinorrhea, sore throat and voice change.   Respiratory: Negative for chest tightness and shortness of breath.   Cardiovascular: Positive for palpitations. Negative for chest pain and leg swelling.  Gastrointestinal: Negative for nausea, vomiting and abdominal pain.  Neurological: Positive for headaches. Negative for dizziness, speech difficulty, weakness and numbness.       Objective:   Physical Exam  Constitutional: She is oriented to person, place, and time. She appears well-developed and well-nourished. No distress.  HENT:  Head: Normocephalic and atraumatic.  Mouth/Throat: Oropharynx is clear and moist. No oropharyngeal exudate.  Eyes: Conjunctivae are normal. No scleral icterus.  Neck: Normal range of motion. Neck supple. No JVD present. No thyromegaly present.  Cardiovascular: Normal rate, regular rhythm, normal heart sounds and intact distal pulses.  Exam reveals no gallop and no friction rub.   No murmur heard. Pulmonary/Chest: Effort normal and breath sounds normal. No respiratory distress. She has no  wheezes. She has no rales. She exhibits no tenderness.  Abdominal: Soft. Bowel sounds are normal. She exhibits no distension and no mass. There is no tenderness. There is no rebound and no guarding.  Musculoskeletal: Normal range of motion.  Lymphadenopathy:    She has no cervical adenopathy.  Neurological: She is alert and oriented to person, place, and time. She has normal strength. No cranial nerve deficit or sensory deficit. She displays a negative Romberg sign. Coordination normal.  Skin: Skin is warm and dry. She is not diaphoretic.  Psychiatric: She has a normal mood and affect. Her behavior is normal. Judgment and thought content normal.  Nursing note and vitals reviewed.         Assessment & Plan:    1. Stress headaches  - topiramate (TOPAMAX) 50 MG tablet; Take 1 tablet nightly at bed time for two weeks.  After 2 weeks increase to two tablets daily.  Dispense: 60 tablet; Refill: 0  2. Obesity -diet and exercise -1600-1800 calories per day - topiramate (TOPAMAX) 50 MG tablet; Take 1 tablet nightly at bed time for two weeks.  After 2 weeks increase to two tablets daily.  Dispense: 60 tablet; Refill: 0

## 2015-01-20 NOTE — Patient Instructions (Signed)
Supraventricular Tachycardia Supraventricular tachycardia (SVT) is an abnormal heart rhythm (arrhythmia) that causes the heart to beat very fast (tachycardia). This kind of fast heartbeat originates in the upper chambers of the heart (atria). SVT can cause the heart to beat greater than 100 beats per minute. SVT can have a rapid burst of heartbeats. This can start and stop suddenly without warning and is called nonsustained. SVT can also be sustained, in which the heart beats at a continuous fast rate.  CAUSES  There can be different causes of SVT. Some of these include:  Heart valve problems such as mitral valve prolapse.  An enlarged heart (hypertrophic cardiomyopathy).  Congenital heart problems.  Heart inflammation (pericarditis).  Hyperthyroidism.  Low potassium or magnesium levels.  Caffeine.  Drug use such as cocaine, methamphetamines, or stimulants.  Some over-the-counter medicines such as:  Decongestants.  Diet medicines.  Herbal medicines. SYMPTOMS  Symptoms of SVT can vary. Symptoms depend on whether the SVT is sustained or nonsustained. You may experience:  No symptoms (asymptomatic).  An awareness of your heart beating rapidly (palpitations).  Shortness of breath.  Chest pain or pressure. If your blood pressure drops because of the SVT, you may experience:  Fainting or near fainting.  Weakness.  Dizziness. DIAGNOSIS  Different tests can be performed to diagnose SVT, such as:  An electrocardiogram (EKG). This is a painless test that records the electrical activity of your heart.  Holter monitor. This is a 24 hour recording of your heart rhythm. You will be given a diary. Write down all symptoms that you have and what you were doing at the time you experienced symptoms.  Arrhythmia monitor. This is a small device that your wear for several weeks. It records the heart rhythm when you have symptoms.  Echocardiogram. This is an imaging test to help detect  abnormal heart structure such as congenital abnormalities, heart valve problems, or heart enlargement.  Stress test. This test can help determine if the SVT is related to exercise.  Electrophysiology study (EPS). This is a procedure that evaluates your heart's electrical system and can help your caregiver find the cause of your SVT. TREATMENT  Treatment of SVT depends on the symptoms, how often it recurs, and whether there are any underlying heart problems.   If symptoms are rare and no other cardiac disease is present, no treatment may be needed.  Blood work may be done to check potassium, magnesium, and thyroid hormone levels to see if they are abnormal. If these levels are abnormal, treatment to correct the problems will occur. Medicines Your caregiver may use oral medicines to treat SVT. These medicines are given for long-term control of SVT. Medicines may be used alone or in combination with other treatments. These medicines work to slow nerve impulses in the heart muscle. These medicines can also be used to treat high blood pressure. Some of these medicines may include:  Calcium channel blockers.  Beta blockers.  Digoxin. Nonsurgical procedures Nonsurgical techniques may be used if oral medicines do not work. Some examples include:  Cardioversion. This technique uses either drugs or an electrical shock to restore a normal heart rhythm.  Cardioversion drugs may be given through an intravenous (IV) line to help "reset" the heart rhythm.  In electrical cardioversion, the caregiver shocks your heart to stop its beat for a split second. This helps to reset the heart to a normal rhythm.  Ablation. This procedure is done under mild sedation. High frequency radio wave energy is used to  destroy the area of heart tissue responsible for the SVT. HOME CARE INSTRUCTIONS   Do not smoke.  Only take medicines prescribed by your caregiver. Check with your caregiver before using over-the-counter  medicines.  Check with your caregiver about how much alcohol and caffeine (coffee, tea, colas, or chocolate) you may have.  It is very important to keep all follow-up referrals and appointments in order to properly manage this problem. SEEK IMMEDIATE MEDICAL CARE IF:  You have dizziness.  You faint or nearly faint.  You have shortness of breath.  You have chest pain or pressure.  You have sudden nausea or vomiting.  You have profuse sweating.  You are concerned about how long your symptoms last.  You are concerned about the frequency of your SVT episodes. If you have the above symptoms, call your local emergency services (911 in [place] immediately. Do not drive yourself to the hospital. MAKE SURE YOU:   Understand these instructions.  Will watch your condition.  Will get help right away if you are not doing well or get worse. Document Released: 07/30/2005 Document Revised: 10/22/2011 Document Reviewed: 11/11/2008 [facility] Patient Information 2015 [place], [place]. This information is not intended to replace advice given to you by your health care provider. Make sure you discuss any questions you have with your health care provider.  Topiramate tablets What is this medicine? TOPIRAMATE (toe PYRE a mate) is used to treat seizures in adults or children with epilepsy. It is also used for the prevention of migraine headaches. This medicine may be used for other purposes; ask your health care provider or pharmacist if you have questions. COMMON BRAND NAME(S): Topamax, Topiragen What should I tell my health care provider before I take this medicine? They need to know if you have any of these conditions: -bleeding disorders -cirrhosis of the liver or liver disease -diarrhea -glaucoma -kidney stones or kidney disease -low blood counts, like low white cell, platelet, or red cell counts -lung disease like asthma, obstructive pulmonary disease, emphysema -metabolic acidosis -on a  ketogenic diet -schedule for surgery or a procedure -suicidal thoughts, plans, or attempt; a previous suicide attempt by you or a family member -an unusual or allergic reaction to topiramate, other medicines, foods, dyes, or preservatives -pregnant or trying to get pregnant -breast-feeding How should I use this medicine? Take this medicine by mouth with a glass of water. Follow the directions on the prescription label. Do not crush or chew. You may take this medicine with meals. Take your medicine at regular intervals. Do not take it more often than directed. Talk to your pediatrician regarding the use of this medicine in children. Special care may be needed. While this drug may be prescribed for children as young as 58 years of age for selected conditions, precautions do apply. Overdosage: If you think you have taken too much of this medicine contact a poison control center or emergency room at once. NOTE: This medicine is only for you. Do not share this medicine with others. What if I miss a dose? If you miss a dose, take it as soon as you can. If your next dose is to be taken in less than 6 hours, then do not take the missed dose. Take the next dose at your regular time. Do not take double or extra doses. What may interact with this medicine? Do not take this medicine with any of the following medications: -probenecid This medicine may also interact with the following medications: -acetazolamide -alcohol -amitriptyline -aspirin  and aspirin-like medicines -birth control pills -certain medicines for depression -certain medicines for seizures -certain medicines that treat or prevent blood clots like warfarin, enoxaparin, dalteparin, apixaban, dabigatran, and rivaroxaban -digoxin -hydrochlorothiazide -lithium -medicines for pain, sleep, or muscle relaxation -metformin -methazolamide -NSAIDS, medicines for pain and inflammation, like ibuprofen or  naproxen -pioglitazone -risperidone This list may not describe all possible interactions. Give your health care provider a list of all the medicines, herbs, non-prescription drugs, or dietary supplements you use. Also tell them if you smoke, drink alcohol, or use illegal drugs. Some items may interact with your medicine. What should I watch for while using this medicine? Visit your doctor or health care professional for regular checks on your progress. Do not stop taking this medicine suddenly. This increases the risk of seizures if you are using this medicine to control epilepsy. Wear a medical identification bracelet or chain to say you have epilepsy or seizures, and carry a card that lists all your medicines. This medicine can decrease sweating and increase your body temperature. Watch for signs of deceased sweating or fever, especially in children. Avoid extreme heat, hot baths, and saunas. Be careful about exercising, especially in hot weather. Contact your health care provider right away if you notice a fever or decrease in sweating. You should drink plenty of fluids while taking this medicine. If you have had kidney stones in the past, this will help to reduce your chances of forming kidney stones. If you have stomach pain, with nausea or vomiting and yellowing of your eyes or skin, call your doctor immediately. You may get drowsy, dizzy, or have blurred vision. Do not drive, use machinery, or do anything that needs mental alertness until you know how this medicine affects you. To reduce dizziness, do not sit or stand up quickly, especially if you are an older patient. Alcohol can increase drowsiness and dizziness. Avoid alcoholic drinks. If you notice blurred vision, eye pain, or other eye problems, seek medical attention at once for an eye exam. The use of this medicine may increase the chance of suicidal thoughts or actions. Pay special attention to how you are responding while on this medicine.  Any worsening of mood, or thoughts of suicide or dying should be reported to your health care professional right away. This medicine may increase the chance of developing metabolic acidosis. If left untreated, this can cause kidney stones, bone disease, or slowed growth in children. Symptoms include breathing fast, fatigue, loss of appetite, irregular heartbeat, or loss of consciousness. Call your doctor immediately if you experience any of these side effects. Also, tell your doctor about any surgery you plan on having while taking this medicine since this may increase your risk for metabolic acidosis. Birth control pills may not work properly while you are taking this medicine. Talk to your doctor about using an extra method of birth control. Women who become pregnant while using this medicine may enroll in the [place] Pregnancy Registry by calling [phone]. This registry collects information about the safety of antiepileptic drug use during pregnancy. What side effects may I notice from receiving this medicine? Side effects that you should report to your doctor or health care professional as soon as possible: -allergic reactions like skin rash, itching or hives, swelling of the face, lips, or tongue -decreased sweating and/or rise in body temperature -depression -difficulty breathing, fast or irregular breathing patterns -difficulty speaking -difficulty walking or controlling muscle movements -hearing impairment -redness, blistering, peeling or loosening of the  skin, including inside the mouth -tingling, pain or numbness in the hands or feet -unusual bleeding or bruising -unusually weak or tired -worsening of mood, thoughts or actions of suicide or dying Side effects that usually do not require medical attention (report to your doctor or health care professional if they continue or are bothersome): -altered taste -back pain, joint or muscle aches and  pains -diarrhea, or constipation -headache -loss of appetite -nausea -stomach upset, indigestion -tremors This list may not describe all possible side effects. Call your doctor for medical advice about side effects. You may report side effects to FDA at 1-800-FDA-1088. Where should I keep my medicine? Keep out of the reach of children. Store at room temperature between 15 and 30 degrees C (59 and 86 degrees F) in a tightly closed container. Protect from moisture. Throw away any unused medicine after the expiration date. NOTE: This sheet is a summary. It may not cover all possible information. If you have questions about this medicine, talk to your doctor, pharmacist, or health care provider.  2015, [employer]. (2013-08-03 23:17:57)

## 2015-01-25 ENCOUNTER — Institutional Professional Consult (permissible substitution): Payer: 59 | Admitting: Internal Medicine

## 2015-02-21 ENCOUNTER — Ambulatory Visit: Payer: Self-pay | Admitting: Internal Medicine

## 2015-02-25 ENCOUNTER — Ambulatory Visit (INDEPENDENT_AMBULATORY_CARE_PROVIDER_SITE_OTHER): Payer: Commercial Managed Care - HMO | Admitting: Internal Medicine

## 2015-02-25 ENCOUNTER — Other Ambulatory Visit: Payer: Self-pay

## 2015-02-25 ENCOUNTER — Encounter: Payer: Self-pay | Admitting: Internal Medicine

## 2015-02-25 ENCOUNTER — Encounter: Payer: Self-pay | Admitting: *Deleted

## 2015-02-25 VITALS — BP 134/80 | HR 85 | Ht 65.5 in | Wt 228.6 lb

## 2015-02-25 DIAGNOSIS — I471 Supraventricular tachycardia: Secondary | ICD-10-CM

## 2015-02-25 DIAGNOSIS — I10 Essential (primary) hypertension: Secondary | ICD-10-CM | POA: Diagnosis not present

## 2015-02-25 MED ORDER — METOPROLOL TARTRATE 25 MG PO TABS: 12.5000 mg | ORAL_TABLET | Freq: Every day | ORAL | Status: DC | PRN

## 2015-02-25 NOTE — Addendum Note (Signed)
Addended by: Janan Halter F on: 02/25/2015 02:59 PM   Modules accepted: Orders

## 2015-02-25 NOTE — Progress Notes (Signed)
HPI Sherry Dennis is referred today by Dr. Melford Aase for evaluation of SVT. She is a pleasant 54 yo woman with a h/o tachypalpitations dating back 20 years. Her symptoms initially improved after she divorced from her husband but over the past several months, she has had recurrent sustained episodes of SVT with HR's up to 200/min. The patient can control her symptoms with vagal maneuvers but at times has required IV adenosine for termination. She notes sob, chest pressure and near syncope. She has been treated with metoprolol with some improvement but she has had break through symptoms as well. She also feels tired on her beta blockers. Allergies  Allergen Reactions  . Darvocet [Propoxyphene N-Acetaminophen] Anaphylaxis  . Darvon [Propoxyphene Hcl] Anaphylaxis    Airway swelling   . Tamiflu [Oseltamivir Phosphate] Other (See Comments)    Increased blood pressure Hives      Current Outpatient Prescriptions  Medication Sig Dispense Refill  . cefdinir (OMNICEF) 300 MG capsule Take 1 capsule by mouth 2 (two) times daily.    Marland Kitchen diltiazem (CARTIA XT) 120 MG 24 hr capsule Take 120 mg by mouth daily.    . fluticasone (FLONASE) 50 MCG/ACT nasal spray Place 1 spray into both nostrils daily.    . hydrochlorothiazide (HYDRODIURIL) 25 MG tablet Take 25 mg by mouth daily as needed (fluid).     Marland Kitchen ibuprofen (ADVIL,MOTRIN) 800 MG tablet Take 800 mg by mouth every 8 (eight) hours as needed for moderate pain.    Marland Kitchen levothyroxine (SYNTHROID) 150 MCG tablet Take 1 tablet (150 mcg total) by mouth daily. 90 tablet 0  . metoprolol tartrate (LOPRESSOR) 25 MG tablet Take 12.5 mg by mouth daily as needed.    . metroNIDAZOLE (METROGEL) 0.75 % gel Apply 1 application topically 2 (two) times daily.     Marland Kitchen OVER THE COUNTER MEDICATION Take 1 tablet by mouth every morning. Equate brand allergy Rx    . OVER THE COUNTER MEDICATION Take 1 tablet by mouth daily as needed (acid reducer). OTC Equate brand Acid Reducer    .  topiramate (TOPAMAX) 50 MG tablet Take 1 tablet nightly at bed time for two weeks.  After 2 weeks increase to two tablets daily. (Patient taking differently: Take 1 tablet by mouth nightly at bed time for two weeks.  After 2 weeks increase to two tablets daily.) 60 tablet 0   No current facility-administered medications for this visit.     Past Medical History  Diagnosis Date  . Hypertension   . Hyperlipidemia   . Thyroid disease   . Vitamin D deficiency   . Palpitations   . PONV (postoperative nausea and vomiting)   . Pneumonia     hx  . Hypothyroidism   . UTI (lower urinary tract infection)   . GERD (gastroesophageal reflux disease)   . H/O hiatal hernia   . Arthritis   . Cancer     skin  . Dysrhythmia     palpitations occ; SVT 09/2013 s/p adenosine  . Hx of cardiovascular stress test     Lexiscan Myoview 6/16: Ejection fraction is 60% and wall motion is normal. The study is normal. There is no scar or ischemia. This is a low risk scan.    ROS:   All systems reviewed and negative except as noted in the HPI.   Past Surgical History  Procedure Laterality Date  . Carpal tunnel release Right 1990  . Tubal ligation  1987  . Knee surgery Right  2013  . Bladder repair  1993    holes,  mesh 10  . Endometrial ablation  2009  . Knee surgery Left 01/2014  . Skin surgery  2014    [facility]  . Replacement unicondylar joint knee Right 02/15/2014  . Partial knee arthroplasty Right 02/15/2014    Procedure: RIGHT UNICOMPARTMENTAL KNEE (MEDIAL COMPARTMENT);  Surgeon: Vickey Huger, MD;  Location: [facility];  Service: Orthopedics;  Laterality: Right;     Family History  Problem Relation Age of Onset  . Hypertension Mother   . Atrial fibrillation Mother   . Diabetes Father   . Emphysema Father   . COPD Father   . Cancer Paternal Aunt     x 4 aunts, breast  . Stroke Maternal Grandmother   . Hyperlipidemia Maternal Grandfather   . Heart disease Maternal Grandfather   . Hyperlipidemia Paternal  Grandmother   . Heart disease Paternal Grandmother   . Hyperlipidemia Paternal Grandfather   . Heart disease Paternal Grandfather   . Heart attack Maternal Grandmother      History   Social History  . Marital Status: Divorced    Spouse Name: N/A  . Number of Children: N/A  . Years of Education: N/A   Occupational History  . Not on file.   Social History Main Topics  . Smoking status: Current Every Day Smoker -- 1.50 packs/day    Types: Cigarettes  . Smokeless tobacco: Not on file     Comment: Pt states d/c cigarretes and started using Vapors  . Alcohol Use: 0.0 oz/week    0 Standard drinks or equivalent per week     Comment: social occasionally  . Drug Use: No  . Sexual Activity: Not on file   Other Topics Concern  . Not on file   Social History Narrative     BP 134/80 mmHg  Pulse 85  Ht 5' 5.5" (1.664 m)  Wt 228 lb 9.6 oz (103.692 kg)  BMI 37.45 kg/m2  Physical Exam:  Well appearing 54 yo woman, NAD HEENT: Unremarkable Neck:  6 cm JVD, no thyromegally Back:  No CVA tenderness Lungs:  Clear with no wheezes HEART:  Regular rate rhythm, no murmurs, no rubs, no clicks Abd:  soft, positive bowel sounds, no organomegally, no rebound, no guarding Ext:  2 plus pulses, no edema, no cyanosis, no clubbing Skin:  No rashes no nodules Neuro:  CN II through XII intact, motor grossly intact  EKG - nsr with no pre-excitation  Assess/Plan:

## 2015-02-25 NOTE — Assessment & Plan Note (Signed)
Her blood pressure is reasonably well controlled. She may require different BP med lowering after her ablation. She is encouraged to lose weight and maintain a low sodium diet.

## 2015-02-25 NOTE — Assessment & Plan Note (Signed)
I have discussed the treatment options with the patient and the risk/benefits/goals/expectations of catheter ablation have been reviewed and she wishes to proceed. Will schedule in the next 4-8 weeks.

## 2015-02-25 NOTE — Patient Instructions (Signed)
Medication Instructions:  Your physician recommends that you continue on your current medications as directed. Please refer to the Current Medication list given to you today.   Labwork: Your physician recommends that you return for lab work on 04/01/15 at 8:00am   Testing/Procedures: Your physician has recommended that you have an ablation. Catheter ablation is a medical procedure used to treat some cardiac arrhythmias (irregular heartbeats). During catheter ablation, a long, thin, flexible tube is put into a blood vessel in your groin (upper thigh), or neck. This tube is called an ablation catheter. It is then guided to your heart through the blood vessel. Radio frequency waves destroy small areas of heart tissue where abnormal heartbeats may cause an arrhythmia to start. Please see the instruction sheet given to you today.   Follow-Up: Your physician recommends that you schedule a follow-up appointment in: 4 weeks from 04/08/15 with Dr Lovena Le  Any Other Special Instructions Will Be Listed Below (If Applicable).    Cardiac Ablation Cardiac ablation is a procedure to disable a small amount of heart tissue in very specific places. The heart has many electrical connections. Sometimes these connections are abnormal and can cause the heart to beat very fast or irregularly. By disabling some of the problem areas, heart rhythm can be improved or made normal. Ablation is done for people who:   Have Wolff-Parkinson-White syndrome.   Have other fast heart rhythms (tachycardia).   Have taken medicines for an abnormal heart rhythm (arrhythmia) that resulted in:   No success.   Side effects.   May have a high-risk heartbeat that could result in death.  LET [facility] CARE PROVIDER KNOW ABOUT:   Any allergies you have or any previous reactions you have had to X-ray dye, food (such as seafood), medicine, or tape.   All medicines you are taking, including vitamins, herbs, eye drops,  creams, and over-the-counter medicines.   Previous problems you or members of your family have had with the use of anesthetics.   Any blood disorders you have.   Previous surgeries or procedures (such as a kidney transplant) you have had.   Medical conditions you have (such as kidney failure).  RISKS AND COMPLICATIONS Generally, cardiac ablation is a safe procedure. However, problems can occur and include:   Increased risk of cancer. Depending on how long it takes to do the ablation, the dose of radiation can be high.  Bruising and bleeding where a thin, flexible tube (catheter) was inserted during the procedure.   Bleeding into the chest, especially into the sac that surrounds the heart (serious).  Need for a permanent pacemaker if the normal electrical system is damaged.   The procedure may not be fully effective, and this may not be recognized for months. Repeat ablation procedures are sometimes required. BEFORE THE PROCEDURE   Follow any instructions from your health care provider regarding eating and drinking before the procedure.   Take your medicines as directed at regular times with water, unless instructed otherwise by your health care provider. If you are taking diabetes medicine, including insulin, ask how you are to take it and if there are any special instructions you should follow. It is common to adjust insulin dosing the day of the ablation.  PROCEDURE  An ablation is usually performed in a catheterization laboratory with the guidance of fluoroscopy. Fluoroscopy is a type of X-ray that helps your health care provider see images of your heart during the procedure.   An ablation is a minimally  invasive procedure. This means a small cut (incision) is made in either your neck or groin. Your health care provider will decide where to make the incision based on your medical history and physical exam.  An IV tube will be started before the procedure begins. You will  be given an anesthetic or medicine to help you relax (sedative).  The skin on your neck or groin will be numbed. A needle will be inserted into a large vein in your neck or groin and catheters will be threaded to your heart.  A special dye that shows up on fluoroscopy pictures may be injected through the catheter. The dye helps your health care provider see the area of the heart that needs treatment.  The catheter has electrodes on the tip. When the area of heart tissue that is causing the arrhythmia is found, the catheter tip will send an electrical current to the area and "scar" the tissue. Three types of energy can be used to ablate the heart tissue:   Heat (radiofrequency energy).   Laser energy.   Extreme cold (cryoablation).   When the area of the heart has been ablated, the catheter will be taken out. Pressure will be held on the insertion site. This will help the insertion site clot and keep it from bleeding. A bandage will be placed on the insertion site.  AFTER THE PROCEDURE   After the procedure, you will be taken to a recovery area where your vital signs (blood pressure, heart rate, and breathing) will be monitored. The insertion site will also be monitored for bleeding.   You will need to lie still for 4-6 hours. This is to ensure you do not bleed from the catheter insertion site.  Document Released: 12/16/2008 Document Revised: 12/14/2013 Document Reviewed: 12/22/2012 [facility] Patient Information 2015 [place], [place]. This information is not intended to replace advice given to you by your health care provider. Make sure you discuss any questions you have with your health care provider.

## 2015-03-15 ENCOUNTER — Encounter: Payer: Self-pay | Admitting: Internal Medicine

## 2015-03-15 ENCOUNTER — Ambulatory Visit (INDEPENDENT_AMBULATORY_CARE_PROVIDER_SITE_OTHER): Payer: Commercial Managed Care - HMO | Admitting: Internal Medicine

## 2015-03-15 VITALS — BP 138/90 | HR 82 | Temp 98.4°F | Resp 18 | Ht 66.0 in | Wt 228.0 lb

## 2015-03-15 DIAGNOSIS — E669 Obesity, unspecified: Secondary | ICD-10-CM

## 2015-03-15 DIAGNOSIS — I10 Essential (primary) hypertension: Secondary | ICD-10-CM

## 2015-03-15 DIAGNOSIS — R7309 Other abnormal glucose: Secondary | ICD-10-CM

## 2015-03-15 DIAGNOSIS — R739 Hyperglycemia, unspecified: Secondary | ICD-10-CM

## 2015-03-15 DIAGNOSIS — E559 Vitamin D deficiency, unspecified: Secondary | ICD-10-CM

## 2015-03-15 DIAGNOSIS — Z79899 Other long term (current) drug therapy: Secondary | ICD-10-CM

## 2015-03-15 DIAGNOSIS — E785 Hyperlipidemia, unspecified: Secondary | ICD-10-CM

## 2015-03-15 LAB — CBC WITH DIFFERENTIAL/PLATELET
BASOS ABS: 0 10*3/uL (ref 0.0–0.1)
Basophils Relative: 0 % (ref 0–1)
Eosinophils Absolute: 0.1 10*3/uL (ref 0.0–0.7)
Eosinophils Relative: 1 % (ref 0–5)
HCT: 36.6 % (ref 36.0–46.0)
Hemoglobin: 12.6 g/dL (ref 12.0–15.0)
LYMPHS ABS: 2.2 10*3/uL (ref 0.7–4.0)
Lymphocytes Relative: 31 % (ref 12–46)
MCH: 32.1 pg (ref 26.0–34.0)
MCHC: 34.4 g/dL (ref 30.0–36.0)
MCV: 93.1 fL (ref 78.0–100.0)
MONOS PCT: 9 % (ref 3–12)
MPV: 10.8 fL (ref 8.6–12.4)
Monocytes Absolute: 0.6 10*3/uL (ref 0.1–1.0)
Neutro Abs: 4.2 10*3/uL (ref 1.7–7.7)
Neutrophils Relative %: 59 % (ref 43–77)
Platelets: 242 10*3/uL (ref 150–400)
RBC: 3.93 MIL/uL (ref 3.87–5.11)
RDW: 12.6 % (ref 11.5–15.5)
WBC: 7.2 10*3/uL (ref 4.0–10.5)

## 2015-03-15 NOTE — Progress Notes (Signed)
Assessment and Plan:  Hypertension:  -Continue medication,  -monitor blood pressure at home.  -Continue DASH diet.   -Reminder to go to the ER if any CP, SOB, nausea, dizziness, severe HA, changes vision/speech, left arm numbness and tingling, and jaw pain.  Cholesterol: -Continue diet and exercise.  -Check cholesterol.   Pre-diabetes: -Continue diet and exercise.  -Check A1C  Vitamin D Def: -check level -continue medications.   Obesity -encouraged to work on diet and exercise -restart topamax  Continue diet and meds as discussed. Further disposition pending results of labs.  HPI 54 y.o. female  presents for 3 month follow up with hypertension, hyperlipidemia, prediabetes and vitamin D.   Her blood pressure has been controlled at home, today their BP is BP: 138/90 mmHg.   She does workout. She denies chest pain, shortness of breath, dizziness.  She has increased her exercise so she has been trying to do more stuff at home and walking around more at home.     She is not on cholesterol medication and denies myalgias. Her cholesterol is not at goal. The cholesterol last visit was:   Lab Results  Component Value Date   CHOL 187 12/08/2014   HDL 42* 12/08/2014   LDLCALC 116* 12/08/2014   TRIG 144 12/08/2014   CHOLHDL 4.5 12/08/2014     She has been working on diet and exercise for prediabetes, and denies foot ulcerations, hyperglycemia, hypoglycemia , increased appetite, nausea, paresthesia of the feet, polydipsia, polyuria, visual disturbances, vomiting and weight loss. Last A1C in the office was:  Lab Results  Component Value Date   HGBA1C 5.9* 12/08/2014    Patient is on Vitamin D supplement.  Lab Results  Component Value Date   VD25OH 23* 12/08/2014     Patient reports that she has continued to smoke.    She also reports that she has been taking topamax and she has not had any weight loss. She reports that she quit taking it, but she felt like when she did take it  she had less hunger and cravings.  She is going to take it again now. She reports that working on her diet has been hit and miss.  She reports that she does not eat at all at work.  She reports that she normally eats one meal per day.  She reports that she has continued to not do a whole lot of exercise.    She is due to have a cardaic ablation done on 04/08/15.     Current Medications:  Current Outpatient Prescriptions on File Prior to Visit  Medication Sig Dispense Refill  . diltiazem (CARTIA XT) 120 MG 24 hr capsule Take 120 mg by mouth daily.    . fluticasone (FLONASE) 50 MCG/ACT nasal spray Place 1 spray into both nostrils daily.    . hydrochlorothiazide (HYDRODIURIL) 25 MG tablet Take 25 mg by mouth daily as needed (fluid).     Marland Kitchen ibuprofen (ADVIL,MOTRIN) 800 MG tablet Take 800 mg by mouth every 8 (eight) hours as needed for moderate pain.    Marland Kitchen levothyroxine (SYNTHROID) 150 MCG tablet Take 1 tablet (150 mcg total) by mouth daily. 90 tablet 0  . metoprolol tartrate (LOPRESSOR) 25 MG tablet Take 0.5 tablets (12.5 mg total) by mouth daily as needed (heart palipations). 30 tablet 11  . OVER THE COUNTER MEDICATION Take 1 tablet by mouth every morning. Equate brand allergy Rx    . OVER THE COUNTER MEDICATION Take 1 tablet by mouth daily as  needed (acid reducer). OTC Equate brand Acid Reducer    . topiramate (TOPAMAX) 50 MG tablet Take 1 tablet nightly at bed time for two weeks.  After 2 weeks increase to two tablets daily. (Patient taking differently: Take 1 tablet by mouth nightly at bed time for two weeks.  After 2 weeks increase to two tablets daily.) 60 tablet 0   No current facility-administered medications on file prior to visit.    Medical History:  Past Medical History  Diagnosis Date  . Hypertension   . Hyperlipidemia   . Thyroid disease   . Vitamin D deficiency   . Palpitations   . PONV (postoperative nausea and vomiting)   . Pneumonia     hx  . Hypothyroidism   . UTI (lower  urinary tract infection)   . GERD (gastroesophageal reflux disease)   . H/O hiatal hernia   . Arthritis   . Cancer     skin  . Dysrhythmia     palpitations occ; SVT 09/2013 s/p adenosine  . Hx of cardiovascular stress test     Lexiscan Myoview 6/16: Ejection fraction is 60% and wall motion is normal. The study is normal. There is no scar or ischemia. This is a low risk scan.    Allergies:  Allergies  Allergen Reactions  . Darvocet [Propoxyphene N-Acetaminophen] Anaphylaxis  . Darvon [Propoxyphene Hcl] Anaphylaxis    Airway swelling   . Tamiflu [Oseltamivir Phosphate] Other (See Comments)    Increased blood pressure Hives      Review of Systems:  Review of Systems  Constitutional: Negative for fever, chills and malaise/fatigue.  HENT: Negative for congestion, ear discharge, sore throat and tinnitus.   Eyes: Negative.   Respiratory: Positive for cough. Negative for shortness of breath and wheezing.   Cardiovascular: Positive for palpitations. Negative for chest pain and leg swelling.  Gastrointestinal: Negative for heartburn, abdominal pain, diarrhea, constipation, blood in stool and melena.  Genitourinary: Negative.   Skin: Negative.   Neurological: Negative for dizziness, sensory change, loss of consciousness and headaches.  Psychiatric/Behavioral: Negative for depression. The patient is not nervous/anxious and does not have insomnia.     Family history- Review and unchanged  Social history- Review and unchanged  Physical Exam: BP 138/90 mmHg  Pulse 82  Temp(Src) 98.4 F (36.9 C) (Temporal)  Resp 18  Ht 5\' 6"  (1.676 m)  Wt 228 lb (103.42 kg)  BMI 36.82 kg/m2 Wt Readings from Last 3 Encounters:  03/15/15 228 lb (103.42 kg)  02/25/15 228 lb 9.6 oz (103.692 kg)  01/20/15 231 lb (104.781 kg)    General Appearance: Well nourished well developed, in no apparent distress. Eyes: PERRLA, EOMs, conjunctiva no swelling or erythema ENT/Mouth: Ear canals normal without  obstruction, swelling, erythma, discharge.  TMs normal bilaterally.  Oropharynx moist, clear, without exudate, or postoropharyngeal swelling. Neck: Supple, thyroid normal,no cervical adenopathy  Respiratory: Respiratory effort normal, Breath sounds clear A&P without rhonchi, wheeze, or rale.  No retractions, no accessory usage. Cardio: RRR with no MRGs. Brisk peripheral pulses without edema.  Abdomen: Soft, + BS,  Non tender, no guarding, rebound, hernias, masses. Musculoskeletal: Full ROM, 5/5 strength, Normal gait Skin: Warm, dry without rashes, lesions, ecchymosis.  Neuro: Awake and oriented X 3, Cranial nerves intact. Normal muscle tone, no cerebellar symptoms. Psych: Normal affect, Insight and Judgment appropriate.    Starlyn Skeans, PA-C 3:45 PM [facility]

## 2015-03-16 LAB — BASIC METABOLIC PANEL WITH GFR
BUN: 14 mg/dL (ref 7–25)
CO2: 24 mmol/L (ref 20–31)
Calcium: 9.8 mg/dL (ref 8.6–10.4)
Chloride: 105 mmol/L (ref 98–110)
Creat: 0.8 mg/dL (ref 0.50–1.05)
GFR, Est Non African American: 85 mL/min (ref >=60)
Glucose, Bld: 97 mg/dL (ref 65–99)
Potassium: 3.9 mmol/L (ref 3.5–5.3)
Sodium: 142 mmol/L (ref 135–146)

## 2015-03-16 LAB — INSULIN, RANDOM: Insulin: 12.8 u[IU]/mL (ref 2.0–19.6)

## 2015-03-16 LAB — HEPATIC FUNCTION PANEL
ALT: 20 U/L (ref 6–29)
AST: 15 U/L (ref 10–35)
Albumin: 4 g/dL (ref 3.6–5.1)
Alkaline Phosphatase: 76 U/L (ref 33–130)
BILIRUBIN INDIRECT: 0.2 mg/dL (ref 0.2–1.2)
BILIRUBIN TOTAL: 0.3 mg/dL (ref 0.2–1.2)
Bilirubin, Direct: 0.1 mg/dL (ref <=0.2)
Total Protein: 6.6 g/dL (ref 6.1–8.1)

## 2015-03-16 LAB — HEMOGLOBIN A1C
Hgb A1c MFr Bld: 6 % — ABNORMAL HIGH (ref <5.7)
Mean Plasma Glucose: 126 mg/dL — ABNORMAL HIGH (ref <117)

## 2015-03-16 LAB — LIPID PANEL
Cholesterol: 157 mg/dL (ref 125–200)
HDL: 37 mg/dL — ABNORMAL LOW (ref >=46)
LDL Cholesterol: 79 mg/dL (ref <130)
Total CHOL/HDL Ratio: 4.2 Ratio (ref <=5.0)
Triglycerides: 204 mg/dL — ABNORMAL HIGH (ref <150)
VLDL: 41 mg/dL — ABNORMAL HIGH (ref <30)

## 2015-03-16 LAB — TSH: TSH: 0.287 u[IU]/mL — AB (ref 0.350–4.500)

## 2015-03-16 LAB — VITAMIN D 25 HYDROXY (VIT D DEFICIENCY, FRACTURES): Vit D, 25-Hydroxy: 23 ng/mL — ABNORMAL LOW (ref 30–100)

## 2015-03-16 LAB — MAGNESIUM: Magnesium: 2.1 mg/dL (ref 1.5–2.5)

## 2015-03-17 ENCOUNTER — Other Ambulatory Visit: Payer: Self-pay | Admitting: Internal Medicine

## 2015-03-17 MED ORDER — LEVOTHYROXINE SODIUM 125 MCG PO TABS: 125.0000 ug | ORAL_TABLET | Freq: Every day | ORAL | Status: DC

## 2015-03-28 ENCOUNTER — Ambulatory Visit: Payer: 59 | Admitting: Cardiovascular Disease

## 2015-03-30 ENCOUNTER — Other Ambulatory Visit (INDEPENDENT_AMBULATORY_CARE_PROVIDER_SITE_OTHER): Payer: Commercial Managed Care - HMO | Admitting: *Deleted

## 2015-03-30 DIAGNOSIS — I471 Supraventricular tachycardia: Secondary | ICD-10-CM | POA: Diagnosis not present

## 2015-03-30 LAB — CBC WITH DIFFERENTIAL/PLATELET
BASOS ABS: 0 10*3/uL (ref 0.0–0.1)
Basophils Relative: 0.4 % (ref 0.0–3.0)
Eosinophils Absolute: 0.1 10*3/uL (ref 0.0–0.7)
Eosinophils Relative: 1 % (ref 0.0–5.0)
HEMATOCRIT: 37.8 % (ref 36.0–46.0)
HEMOGLOBIN: 12.7 g/dL (ref 12.0–15.0)
LYMPHS PCT: 33.1 % (ref 12.0–46.0)
Lymphs Abs: 2.4 10*3/uL (ref 0.7–4.0)
MCHC: 33.6 g/dL (ref 30.0–36.0)
MCV: 96.3 fl (ref 78.0–100.0)
MONOS PCT: 9.1 % (ref 3.0–12.0)
Monocytes Absolute: 0.7 10*3/uL (ref 0.1–1.0)
NEUTROS PCT: 56.4 % (ref 43.0–77.0)
Neutro Abs: 4.1 10*3/uL (ref 1.4–7.7)
Platelets: 202 10*3/uL (ref 150.0–400.0)
RBC: 3.92 Mil/uL (ref 3.87–5.11)
RDW: 12.6 % (ref 11.5–15.5)
WBC: 7.2 10*3/uL (ref 4.0–10.5)

## 2015-03-30 LAB — BASIC METABOLIC PANEL
BUN: 17 mg/dL (ref 6–23)
CALCIUM: 9.8 mg/dL (ref 8.4–10.5)
CO2: 23 mEq/L (ref 19–32)
Chloride: 109 mEq/L (ref 96–112)
Creatinine, Ser: 0.91 mg/dL (ref 0.40–1.20)
GFR: 68.87 mL/min (ref >60.00)
Glucose, Bld: 102 mg/dL — ABNORMAL HIGH (ref 70–99)
Potassium: 3.8 mEq/L (ref 3.5–5.1)
SODIUM: 140 meq/L (ref 135–145)

## 2015-03-30 NOTE — Addendum Note (Signed)
Addended by: Eulis Foster on: 03/30/2015 09:06 AM   Modules accepted: Orders

## 2015-04-05 NOTE — Progress Notes (Signed)
Patient ID: Sherry Dennis, female   DOB: March 31, 1961, 54 y.o.   MRN: [id number]    Cardiology Office Note   Date:  04/05/2015   ID:  Sherry Dennis, DOB [birth year], MRN [id number]  PCP:  Alesia Richards, MD  Cardiologist:  Dr. Jenkins Rouge     No chief complaint on file.    History of Present Illness: Sherry Dennis is a 54 y.o. female with a hx of palpitations, HTN, HL, hypothyroidism.  Initially seen by me 08/2013.  She was to FU prn.  She was then seen in the ED in 22/105 with SVT with HR of 160.  She converted to NSR with Adenosine 6 mg.  She was placed on beta-blocker and DC from the ED.   W/U reviewed: Normal stress myovue 01/2015  Echo 10/01/13 - Mild LVH. EF 55% to 60%. Grade 1 diastolic dysfunction - Left atrium: The atrium was normal in size.  Subsequently seen by Dr Lovena Le for SVT ablation which is yet to be done  Past Medical History  Diagnosis Date  . Hypertension   . Hyperlipidemia   . Thyroid disease   . Vitamin D deficiency   . Palpitations   . PONV (postoperative nausea and vomiting)   . Pneumonia     hx  . Hypothyroidism   . UTI (lower urinary tract infection)   . GERD (gastroesophageal reflux disease)   . H/O hiatal hernia   . Arthritis   . Cancer     skin  . Dysrhythmia     palpitations occ; SVT 09/2013 s/p adenosine  . Hx of cardiovascular stress test     Lexiscan Myoview 6/16: Ejection fraction is 60% and wall motion is normal. The study is normal. There is no scar or ischemia. This is a low risk scan.    Past Surgical History  Procedure Laterality Date  . Carpal tunnel release Right 1990  . Tubal ligation  1987  . Knee surgery Right 2013  . Bladder repair  1993    holes,  mesh 10  . Endometrial ablation  2009  . Knee surgery Left 01/2014  . Skin surgery  2014    BCC  . Replacement unicondylar joint knee Right 02/15/2014  . Partial knee arthroplasty Right 02/15/2014    Procedure: RIGHT UNICOMPARTMENTAL KNEE (MEDIAL COMPARTMENT);  Surgeon: Vickey Huger, MD;  Location: [facility];  Service: Orthopedics;  Laterality: Right;     Current Outpatient Prescriptions  Medication Sig Dispense Refill  . diltiazem (CARTIA XT) 120 MG 24 hr capsule Take 120 mg by mouth daily.    . fluticasone (FLONASE) 50 MCG/ACT nasal spray Place 1 spray into both nostrils daily.    . hydrochlorothiazide (HYDRODIURIL) 25 MG tablet Take 25 mg by mouth daily as needed (fluid).     Marland Kitchen ibuprofen (ADVIL,MOTRIN) 800 MG tablet Take 800 mg by mouth every 8 (eight) hours as needed for moderate pain.    Marland Kitchen levothyroxine (SYNTHROID) 125 MCG tablet Take 1 tablet (125 mcg total) by mouth daily before breakfast. 30 tablet 1  . metoprolol tartrate (LOPRESSOR) 25 MG tablet Take 0.5 tablets (12.5 mg total) by mouth daily as needed (heart palipations). 30 tablet 11  . OVER THE COUNTER MEDICATION Take 1 tablet by mouth every morning. Equate brand allergy Rx    . OVER THE COUNTER MEDICATION Take 1 tablet by mouth daily as needed (acid reducer). OTC Equate brand Acid Reducer    . topiramate (TOPAMAX) 50 MG tablet Take 1  tablet nightly at bed time for two weeks.  After 2 weeks increase to two tablets daily. (Patient taking differently: Take 1 tablet by mouth nightly at bed time for two weeks.  After 2 weeks increase to two tablets daily.) 60 tablet 0   No current facility-administered medications for this visit.    Allergies:   Darvocet; Darvon; and Tamiflu    Social History:  The patient  reports that she has been smoking Cigarettes.  She has been smoking about 1.50 packs per day. She does not have any smokeless tobacco history on file. She reports that she drinks alcohol. She reports that she does not use illicit drugs.   Family History:  The patient's family history includes Atrial fibrillation in her mother; COPD in her father; Cancer in her paternal aunt; Diabetes in her father; Emphysema in her father; Heart attack in her maternal grandmother; Heart disease in her maternal grandfather,  paternal grandfather, and paternal grandmother; Hyperlipidemia in her maternal grandfather, paternal grandfather, and paternal grandmother; Hypertension in her mother; Stroke in her maternal grandmother.    ROS:   Please see the history of present illness.   Review of Systems  Constitution: Positive for malaise/fatigue.  HENT: Positive for headaches.   Cardiovascular: Positive for chest pain, dyspnea on exertion and irregular heartbeat.  Hematologic/Lymphatic: Bruises/bleeds easily.  Neurological: Positive for dizziness.  All other systems reviewed and are negative.    PHYSICAL EXAM: VS:  There were no vitals taken for this visit.    Wt Readings from Last 3 Encounters:  03/15/15 103.42 kg (228 lb)  02/25/15 103.692 kg (228 lb 9.6 oz)  01/20/15 104.781 kg (231 lb)     GEN: Well nourished, well developed, in no acute distress HEENT: normal Neck: no JVD,  no masses Cardiac:  Normal S1/S2, RRR; no murmur ,  no rubs or gallops, no edema  Respiratory:  clear to auscultation bilaterally, no wheezing, rhonchi or rales. GI: soft, nontender, nondistended, + BS MS: no deformity or atrophy Skin: warm and dry  Neuro:  CNs II-XII intact, Strength and sensation are intact Psych: Normal affect   EKG:  EKG is ordered today.  It demonstrates:   #1 SVT with HR 125;  #2 NSR, HR 72, LAD, PRWP, no significant change since last tracing.   Recent Labs: 03/15/2015: ALT 20; Magnesium 2.1; TSH 0.287* 03/30/2015: BUN 17; Creatinine, Ser 0.91; Hemoglobin 12.7; Platelets 202.0; Potassium 3.8; Sodium 140    Lipid Panel    Component Value Date/Time   CHOL 157 03/15/2015 1557   TRIG 204* 03/15/2015 1557   HDL 37* 03/15/2015 1557   CHOLHDL 4.2 03/15/2015 1557   VLDL 41* 03/15/2015 1557   LDLCALC 79 03/15/2015 1557      ASSESSMENT AND PLAN:  PSVT (paroxysmal supraventricular tachycardia):  Fort ablation with GT Friday. Labs ok  HTN:  Well controlled.  Continue current medications and low sodium  Dash type diet.     Chol:   Lab Results  Component Value Date   LDLCALC 79 03/15/2015

## 2015-04-06 ENCOUNTER — Ambulatory Visit (INDEPENDENT_AMBULATORY_CARE_PROVIDER_SITE_OTHER): Payer: Commercial Managed Care - HMO | Admitting: Cardiovascular Disease

## 2015-04-06 ENCOUNTER — Encounter: Payer: Self-pay | Admitting: Cardiovascular Disease

## 2015-04-06 VITALS — BP 120/70 | HR 88 | Ht 65.0 in | Wt 224.0 lb

## 2015-04-06 DIAGNOSIS — R002 Palpitations: Secondary | ICD-10-CM | POA: Diagnosis not present

## 2015-04-08 ENCOUNTER — Encounter (HOSPITAL_COMMUNITY): Payer: Self-pay | Admitting: Internal Medicine

## 2015-04-08 ENCOUNTER — Ambulatory Visit (HOSPITAL_COMMUNITY)
Admission: RE | Admit: 2015-04-08 | Discharge: 2015-04-08 | Disposition: A | Discharge status: Home / Self Care | Payer: Commercial Managed Care - HMO | Source: Ambulatory Visit | Attending: Internal Medicine | Admitting: Internal Medicine

## 2015-04-08 ENCOUNTER — Encounter (HOSPITAL_COMMUNITY)
Admission: RE | Disposition: A | Discharge status: Home / Self Care | Payer: Self-pay | Source: Ambulatory Visit | Attending: Internal Medicine

## 2015-04-08 DIAGNOSIS — I471 Supraventricular tachycardia, unspecified: Secondary | ICD-10-CM | POA: Diagnosis present

## 2015-04-08 DIAGNOSIS — E669 Obesity, unspecified: Secondary | ICD-10-CM | POA: Diagnosis not present

## 2015-04-08 DIAGNOSIS — Z6836 Body mass index (BMI) 36.0-36.9, adult: Secondary | ICD-10-CM | POA: Diagnosis not present

## 2015-04-08 DIAGNOSIS — Z791 Long term (current) use of non-steroidal anti-inflammatories (NSAID): Secondary | ICD-10-CM | POA: Diagnosis not present

## 2015-04-08 DIAGNOSIS — Z792 Long term (current) use of antibiotics: Secondary | ICD-10-CM | POA: Insufficient documentation

## 2015-04-08 DIAGNOSIS — E039 Hypothyroidism, unspecified: Secondary | ICD-10-CM | POA: Insufficient documentation

## 2015-04-08 DIAGNOSIS — Z79899 Other long term (current) drug therapy: Secondary | ICD-10-CM | POA: Insufficient documentation

## 2015-04-08 DIAGNOSIS — Z7951 Long term (current) use of inhaled steroids: Secondary | ICD-10-CM | POA: Insufficient documentation

## 2015-04-08 DIAGNOSIS — E785 Hyperlipidemia, unspecified: Secondary | ICD-10-CM | POA: Diagnosis not present

## 2015-04-08 DIAGNOSIS — F17210 Nicotine dependence, cigarettes, uncomplicated: Secondary | ICD-10-CM | POA: Insufficient documentation

## 2015-04-08 DIAGNOSIS — F17200 Nicotine dependence, unspecified, uncomplicated: Secondary | ICD-10-CM | POA: Diagnosis present

## 2015-04-08 DIAGNOSIS — I10 Essential (primary) hypertension: Secondary | ICD-10-CM | POA: Diagnosis present

## 2015-04-08 HISTORY — DX: Unspecified chronic bronchitis: J42

## 2015-04-08 HISTORY — DX: Basal cell carcinoma of skin, unspecified: C44.91

## 2015-04-08 HISTORY — PX: ELECTROPHYSIOLOGIC STUDY: SHX172A

## 2015-04-08 SURGERY — A-FLUTTER/A-TACH/SVT ABLATION
Anesthesia: LOCAL

## 2015-04-08 MED ORDER — FENTANYL CITRATE (PF) 100 MCG/2ML IJ SOLN
INTRAMUSCULAR | Status: AC
  Filled 2015-04-08: qty 4

## 2015-04-08 MED ORDER — HEPARIN (PORCINE) IN NACL 2-0.9 UNIT/ML-% IJ SOLN
INTRAMUSCULAR | Status: DC | PRN
  Administered 2015-04-08: 500 mL

## 2015-04-08 MED ORDER — ONDANSETRON HCL 4 MG/2ML IJ SOLN: 4.0000 mg | Freq: Four times a day (QID) | INTRAMUSCULAR | Status: DC | PRN

## 2015-04-08 MED ORDER — SODIUM CHLORIDE 0.9 % IJ SOLN: 3.0000 mL | INTRAMUSCULAR | Status: DC | PRN

## 2015-04-08 MED ORDER — MIDAZOLAM HCL 5 MG/5ML IJ SOLN
INTRAMUSCULAR | Status: AC
  Filled 2015-04-08: qty 25

## 2015-04-08 MED ORDER — SODIUM CHLORIDE 0.9 % IJ SOLN: 3.0000 mL | Freq: Two times a day (BID) | INTRAMUSCULAR | Status: DC

## 2015-04-08 MED ORDER — FENTANYL CITRATE (PF) 100 MCG/2ML IJ SOLN
INTRAMUSCULAR | Status: DC | PRN
  Administered 2015-04-08 (×3): 12.5 ug via INTRAVENOUS
  Administered 2015-04-08: 25 ug via INTRAVENOUS
  Administered 2015-04-08 (×4): 12.5 ug via INTRAVENOUS
  Administered 2015-04-08: 25 ug via INTRAVENOUS

## 2015-04-08 MED ORDER — BUPIVACAINE HCL (PF) 0.25 % IJ SOLN
INTRAMUSCULAR | Status: AC
  Filled 2015-04-08: qty 30

## 2015-04-08 MED ORDER — SODIUM CHLORIDE 0.9 % IV SOLN: 250.0000 mL | INTRAVENOUS | Status: DC | PRN

## 2015-04-08 MED ORDER — CEFUROXIME AXETIL 500 MG PO TABS: 500.0000 mg | ORAL_TABLET | Freq: Two times a day (BID) | ORAL | Status: DC

## 2015-04-08 MED ORDER — FLUTICASONE PROPIONATE 50 MCG/ACT NA SUSP
1.0000 | Freq: Every day | NASAL | Status: DC
  Filled 2015-04-08: qty 16

## 2015-04-08 MED ORDER — BUPIVACAINE HCL (PF) 0.25 % IJ SOLN
INTRAMUSCULAR | Status: DC | PRN
  Administered 2015-04-08: 30 mL

## 2015-04-08 MED ORDER — TOPIRAMATE 25 MG PO TABS: 100.0000 mg | ORAL_TABLET | Freq: Every day | ORAL | Status: DC

## 2015-04-08 MED ORDER — HYDROCHLOROTHIAZIDE 25 MG PO TABS: 25.0000 mg | ORAL_TABLET | Freq: Every day | ORAL | Status: DC | PRN

## 2015-04-08 MED ORDER — LEVOTHYROXINE SODIUM 125 MCG PO TABS: 125.0000 ug | ORAL_TABLET | Freq: Every day | ORAL | Status: DC

## 2015-04-08 MED ORDER — MIDAZOLAM HCL 5 MG/5ML IJ SOLN
INTRAMUSCULAR | Status: DC | PRN
Start: 2015-04-08 — End: 2015-04-08
  Administered 2015-04-08 (×8): 1 mg via INTRAVENOUS
  Administered 2015-04-08: 2 mg via INTRAVENOUS
  Administered 2015-04-08: 1 mg via INTRAVENOUS

## 2015-04-08 MED ORDER — FENTANYL CITRATE (PF) 100 MCG/2ML IJ SOLN: 25.0000 ug | INTRAMUSCULAR | Status: DC | PRN

## 2015-04-08 SURGICAL SUPPLY — 11 items
BAG SNAP BAND KOVER 36X36 (MISCELLANEOUS) ×1 IMPLANT
CATH HEX JOSEPH 2-5-2 65CM 6F (CATHETERS) ×1 IMPLANT
CATH JOSEPHSON QUAD-ALLRED 6FR (CATHETERS) ×1 IMPLANT
CATH THERMISTOR 7FR 4MM (ABLATOR) ×1 IMPLANT
PACK EP LATEX FREE (CUSTOM PROCEDURE TRAY) ×2
PACK EP LF (CUSTOM PROCEDURE TRAY) IMPLANT
PAD DEFIB LIFELINK (PAD) ×1 IMPLANT
SHEATH PINNACLE 6F 10CM (SHEATH) ×2 IMPLANT
SHEATH PINNACLE 7F 10CM (SHEATH) ×1 IMPLANT
SHEATH PINNACLE 8F 10CM (SHEATH) ×1 IMPLANT
SHIELD RADPAD SCOOP 12X17 (MISCELLANEOUS) ×1 IMPLANT

## 2015-04-08 NOTE — Progress Notes (Signed)
D/c instructions reviewed with pt, copy of instructions given to pt. Pt waiting for family to arrive to take home.

## 2015-04-08 NOTE — Progress Notes (Signed)
Pt d/c'd via wheelchair with belongings, with family, escorted by unit NT.

## 2015-04-08 NOTE — Progress Notes (Signed)
Site area: rt groin 3 fv sheaths Site Prior to Removal:  Level 0 Pressure Applied For:  20 minutes Manual:   Yes   Patient Status During Pull:  stable Post Pull Site:  Level  0 Post Pull Instructions Given:  Yes   Post Pull Pulses Present: yes Dressing Applied:  Small tegaderm Bedrest begins @  1020 Comments:  0. Rt ij site c,d,i. IV saline locked

## 2015-04-08 NOTE — H&P (Signed)
HPI Ms. Laduke is referred today by Dr. Melford Aase for evaluation of SVT. She is a pleasant 54 yo woman with a h/o tachypalpitations dating back 20 years. Her symptoms initially improved after she divorced from her husband but over the past several months, she has had recurrent sustained episodes of SVT with HR's up to 200/min. The patient can control her symptoms with vagal maneuvers but at times has required IV adenosine for termination. She notes sob, chest pressure and near syncope. She has been treated with metoprolol with some improvement but she has had break through symptoms as well. She also feels tired on her beta blockers. Allergies  Allergen Reactions  . Darvocet [Propoxyphene N-Acetaminophen] Anaphylaxis  . Darvon [Propoxyphene Hcl] Anaphylaxis    Airway swelling   . Tamiflu [Oseltamivir Phosphate] Other (See Comments)    Increased blood pressure Hives      Current Outpatient Prescriptions  Medication Sig Dispense Refill  . cefdinir (OMNICEF) 300 MG capsule Take 1 capsule by mouth 2 (two) times daily.    Marland Kitchen diltiazem (CARTIA XT) 120 MG 24 hr capsule Take 120 mg by mouth daily.    . fluticasone (FLONASE) 50 MCG/ACT nasal spray Place 1 spray into both nostrils daily.    . hydrochlorothiazide (HYDRODIURIL) 25 MG tablet Take 25 mg by mouth daily as needed (fluid).     Marland Kitchen ibuprofen (ADVIL,MOTRIN) 800 MG tablet Take 800 mg by mouth every 8 (eight) hours as needed for moderate pain.    Marland Kitchen levothyroxine (SYNTHROID) 150 MCG tablet Take 1 tablet (150 mcg total) by mouth daily. 90 tablet 0  . metoprolol tartrate (LOPRESSOR) 25 MG tablet Take 12.5 mg by mouth daily as needed.    . metroNIDAZOLE (METROGEL) 0.75 % gel Apply 1 application topically 2 (two) times daily.     Marland Kitchen OVER THE COUNTER MEDICATION Take 1 tablet by mouth every morning. Equate brand allergy Rx    . OVER THE COUNTER MEDICATION Take 1 tablet by mouth daily as  needed (acid reducer). OTC Equate brand Acid Reducer    . topiramate (TOPAMAX) 50 MG tablet Take 1 tablet nightly at bed time for two weeks. After 2 weeks increase to two tablets daily. (Patient taking differently: Take 1 tablet by mouth nightly at bed time for two weeks. After 2 weeks increase to two tablets daily.) 60 tablet 0   No current facility-administered medications for this visit.     Past Medical History  Diagnosis Date  . Hypertension   . Hyperlipidemia   . Thyroid disease   . Vitamin D deficiency   . Palpitations   . PONV (postoperative nausea and vomiting)   . Pneumonia     hx  . Hypothyroidism   . UTI (lower urinary tract infection)   . GERD (gastroesophageal reflux disease)   . H/O hiatal hernia   . Arthritis   . Cancer     skin  . Dysrhythmia     palpitations occ; SVT 09/2013 s/p adenosine  . Hx of cardiovascular stress test     Lexiscan Myoview 6/16: Ejection fraction is 60% and wall motion is normal. The study is normal. There is no scar or ischemia. This is a low risk scan.    ROS:  All systems reviewed and negative except as noted in the HPI.   Past Surgical History  Procedure Laterality Date  . Carpal tunnel release Right 1990  . Tubal ligation  1987  . Knee surgery Right 2013  . Bladder repair  1993  holes, mesh 10  . Endometrial ablation  2009  . Knee surgery Left 01/2014  . Skin surgery  2014    [facility]  . Replacement unicondylar joint knee Right 02/15/2014  . Partial knee arthroplasty Right 02/15/2014    Procedure: RIGHT UNICOMPARTMENTAL KNEE (MEDIAL COMPARTMENT); Surgeon: Vickey Huger, MD; Location: [facility]; Service: Orthopedics; Laterality: Right;     Family History  Problem Relation Age of Onset  . Hypertension Mother   . Atrial fibrillation Mother   . Diabetes Father   . Emphysema Father    . COPD Father   . Cancer Paternal Aunt     x 4 aunts, breast  . Stroke Maternal Grandmother   . Hyperlipidemia Maternal Grandfather   . Heart disease Maternal Grandfather   . Hyperlipidemia Paternal Grandmother   . Heart disease Paternal Grandmother   . Hyperlipidemia Paternal Grandfather   . Heart disease Paternal Grandfather   . Heart attack Maternal Grandmother      History   Social History  . Marital Status: Divorced    Spouse Name: N/A  . Number of Children: N/A  . Years of Education: N/A   Occupational History  . Not on file.   Social History Main Topics  . Smoking status: Current Every Day Smoker -- 1.50 packs/day    Types: Cigarettes  . Smokeless tobacco: Not on file     Comment: Pt states d/c cigarretes and started using Vapors  . Alcohol Use: 0.0 oz/week    0 Standard drinks or equivalent per week     Comment: social occasionally  . Drug Use: No  . Sexual Activity: Not on file   Other Topics Concern  . Not on file   Social History Narrative     BP 134/80 mmHg  Pulse 85  Ht 5' 5.5" (1.664 m)  Wt 228 lb 9.6 oz (103.692 kg)  BMI 37.45 kg/m2  Physical Exam:  Well appearing 54 yo woman, NAD HEENT: Unremarkable Neck: 6 cm JVD, no thyromegally Back: No CVA tenderness Lungs: Clear with no wheezes HEART: Regular rate rhythm, no murmurs, no rubs, no clicks Abd: soft, positive bowel sounds, no organomegally, no rebound, no guarding Ext: 2 plus pulses, no edema, no cyanosis, no clubbing Skin: No rashes no nodules Neuro: CN II through XII intact, motor grossly intact  EKG - nsr with no pre-excitation  Assess/Plan:            PSVT (paroxysmal supraventricular tachycardia) - Evans Lance, MD at 02/25/2015 2:49 PM     Status: Written Related Problem: PSVT (paroxysmal supraventricular tachycardia)   Expand All Collapse All   I  have discussed the treatment options with the patient and the risk/benefits/goals/expectations of catheter ablation have been reviewed and she wishes to proceed. Will schedule in the next 4-8 weeks.             Hypertension - Evans Lance, MD at 02/25/2015 2:51 PM     Status: Written Related Problem: Hypertension   Expand All Collapse All   Her blood pressure is reasonably well controlled. She may require different BP med lowering after her ablation. She is encouraged to lose weight and maintain a low sodium diet.         EP Attending  Patient seen and examined. Agree with above. Since her clinic visit last month, no change in the history, exam, assessment and plan. For EPS/RFS of SVT.   Mikle Bosworth.D.

## 2015-04-08 NOTE — Progress Notes (Signed)
Site area: rt IJ venous sheath Site Prior to Removal:  Level 0 Pressure Applied For:  10 minutes Manual:   yes Patient Status During Pull:  stable Post Pull Site:  Level  0 Post Pull Instructions Given:  yes Post Pull Pulses Present:   NA  Dressing Applied:  Small tegaderm Bedrest begins @  Comments:   

## 2015-04-08 NOTE — Discharge Summary (Signed)
Discharge Summary   Patient ID: Sherry Dennis MRN: [id number], DOB/AGE: 1961-01-25 54 y.o. Admit date: 04/08/2015 D/C date:     04/08/2015  Primary Cardiologist: Dr. Johnsie Cancel / Dr Lovena Le for EP   Principal Problem:   SVT (supraventricular tachycardia) Active Problems:   Hypertension   Hyperlipidemia   Obesity   Tobacco use disorder   PSVT (paroxysmal supraventricular tachycardia)    Admission Dates: 04/08/15-04/08/15 Discharge Diagnosis: SVT s/p RFCA ablation   HPI: Sherry Dennis is a 54 y.o. female with a history of SVT, HTN, HLD, and hypothyroidism who was admitted 04/08/15 for planned SVT ablation.   Initially seen by Dr. Johnsie Cancel in 08/2013. She was to FU prn. She was then seen in the ED in 09/2013 with SVT with HR of 160. She converted to NSR with Adenosine 6 mg. She was placed on beta-blocker and DC from the ED.   W/U reviewed: Normal stress myovue 01/2015  Echo 10/01/13 - Mild LVH. EF 55% to 60%. Grade 1 diastolic dysfunction - Left atrium: The atrium was normal in size.  She has a h/o tachypalpitations dating back 20 years. Her symptoms initially improved after she divorced from her husband but over the past several months, she has had recurrent sustained episodes of SVT with HR's up to 200/min. The patient can control her symptoms with vagal maneuvers but at times has required IV adenosine for termination. She notes sob, chest pressure and near syncope. She has been treated with metoprolol with some improvement but she has had break through symptoms as well. She also feels tired on her beta blockers.   Hospital Course  Symptomatic recurrent short RP SVT- s/p EP study and radiofrequency ablation. -- She had successful radiofrequency modification of the slow AV nodal pathway  -- She will go home on metoprolol tartrate 12.5 mg BID and stop her Dilt XT 120mg .   HTN- continue metoprolol 12.5mg  BID   Hypothyroidism- continue synthroid  The patient has had an uncomplicated  hospital course and is recovering well. The femoral catheter site is stable. She has been seen by Dr. Lovena Le today and deemed ready for discharge home. All follow-up appointments have been scheduled. Discharge medications are listed below.   Discharge Vitals: Blood pressure 124/68, pulse 61, temperature 98.9 F (37.2 C), temperature source Oral, resp. rate 16, height 5' 5.5" (1.664 m), weight 224 lb (101.606 kg), SpO2 98 %.  Labs: Lab Results  Component Value Date   WBC 7.2 03/30/2015   HGB 12.7 03/30/2015   HCT 37.8 03/30/2015   MCV 96.3 03/30/2015   PLT 202.0 03/30/2015     Diagnostic Studies/Procedures   EP STUDY 04/08/15 PREPROCEDURE DIAGNOSIS: SVT   POSTPROCEDURE DIAGNOSIS: Classic AV nodal reentrant tachycardia  PROCEDURES:  1. Comprehensive EP study.  2. Coronary sinus pacing and recording.  3. Mapping of supraventricular tachycardia.  4. Radiofrequency ablation of supraventricular tachycardia.   INTRODUCTION: Sherry Dennis is a 54 y.o. female with a history of symptomatic recurrent short RP SVT who presents today for EP study and radiofrequency ablation. The patient has had recurrent symptomatic SVT. She has failed medical therapy with verapamil. She now presents for EP study and radiofrequency ablation of SVT.   CONCLUSIONS:  1. Sinus rhythm upon presentation.  2. The patient had dual AV nodal physiology with easily inducible classic AV nodal reentrant tachycardia, there were no other accessory pathways or arrhythmias induced  3. Successful radiofrequency modification of the slow AV nodal pathway  4. No inducible  arrhythmias following ablation.  5. No early apparent complications.    Will Camnitz, M.D./Chanise Habeck,M.D.  Discharge Medications     Medication List    STOP taking these medications        CARTIA XT 120 MG 24 hr capsule  Generic drug:  diltiazem      TAKE these medications        cefdinir 300 MG capsule  Commonly known as:  OMNICEF    Take 300 mg by mouth 2 (two) times daily. For 10 days     fluticasone 50 MCG/ACT nasal spray  Commonly known as:  FLONASE  Place 1 spray into both nostrils daily.     hydrochlorothiazide 25 MG tablet  Commonly known as:  HYDRODIURIL  Take 25 mg by mouth daily as needed (fluid).     ibuprofen 800 MG tablet  Commonly known as:  ADVIL,MOTRIN  Take 800 mg by mouth every 8 (eight) hours as needed for moderate pain.     levothyroxine 125 MCG tablet  Commonly known as:  SYNTHROID  Take 1 tablet (125 mcg total) by mouth daily before breakfast.     metoprolol tartrate 25 MG tablet  Commonly known as:  LOPRESSOR  Take 0.5 tablets (12.5 mg total) by mouth daily as needed (heart palipations).     OVER THE COUNTER MEDICATION  Take 1 tablet by mouth daily as needed (acid reducer). OTC Equate brand Acid Reducer     OVER THE COUNTER MEDICATION  Take 1 tablet by mouth every morning. Equate brand allergy Rx     topiramate 50 MG tablet  Commonly known as:  TOPAMAX  Take 100 mg by mouth daily.        Disposition   The patient will be discharged in stable condition to home.      Duration of Discharge Encounter: Greater than 30 minutes including physician and PA time.  Mable Fill R PA-C 04/08/2015, 3:09 PM   EP Attending  Agree with above exam, data and plan. [place] for discharge. Usual followup.  Mikle Bosworth.D.

## 2015-05-10 ENCOUNTER — Ambulatory Visit (INDEPENDENT_AMBULATORY_CARE_PROVIDER_SITE_OTHER): Payer: Commercial Managed Care - HMO | Admitting: Internal Medicine

## 2015-05-10 ENCOUNTER — Encounter: Payer: Self-pay | Admitting: Internal Medicine

## 2015-05-10 VITALS — BP 138/88 | HR 83 | Ht 65.5 in | Wt 229.0 lb

## 2015-05-10 DIAGNOSIS — I10 Essential (primary) hypertension: Secondary | ICD-10-CM

## 2015-05-10 DIAGNOSIS — I471 Supraventricular tachycardia: Secondary | ICD-10-CM | POA: Diagnosis not present

## 2015-05-10 MED ORDER — HYDROCHLOROTHIAZIDE 25 MG PO TABS: 25.0000 mg | ORAL_TABLET | Freq: Every day | ORAL | Status: DC | PRN

## 2015-05-10 NOTE — Assessment & Plan Note (Signed)
Her blood pressure is slightly elevated. She is encouraged to lose weight and reduce her weight and salt intake.

## 2015-05-10 NOTE — Assessment & Plan Note (Signed)
She is s/p catheter ablation and is doing well with no recurrent symptoms off of medications. She will undergo watchful waiting.

## 2015-05-10 NOTE — Progress Notes (Signed)
HPI Sherry Dennis returns today after undergoing catheter ablation of SVT. The patient is a pleasant middle age woman with a h/o SVT who underwent catheter ablation several weeks ago. She had typical AVNRT and her ablation was successful. In the interim, she has had no recurrent symptomatic SVT. She has begun to exercise with no problems. She is trying to lose weight. No sob or chest pain. Allergies  Allergen Reactions  . Darvocet [Propoxyphene N-Acetaminophen] Anaphylaxis  . Darvon [Propoxyphene Hcl] Anaphylaxis and Other (See Comments)    Airway swelling   . Tamiflu [Oseltamivir Phosphate] Other (See Comments)    Increased blood pressure Hives      Current Outpatient Prescriptions  Medication Sig Dispense Refill  . fluticasone (FLONASE) 50 MCG/ACT nasal spray Place 1 spray into both nostrils daily.    . hydrochlorothiazide (HYDRODIURIL) 25 MG tablet Take 25 mg by mouth daily as needed (fluid).     Marland Kitchen ibuprofen (ADVIL,MOTRIN) 800 MG tablet Take 800 mg by mouth every 8 (eight) hours as needed for moderate pain.    . metoprolol tartrate (LOPRESSOR) 25 MG tablet Take 0.5 tablets (12.5 mg total) by mouth daily as needed (heart palipations). 30 tablet 11  . Multiple Vitamins-Minerals (EMERGEN-C FIVE PO) Take 1 Package by mouth daily.    Marland Kitchen OVER THE COUNTER MEDICATION Take 1 tablet by mouth every morning. Equate brand allergy Rx    . OVER THE COUNTER MEDICATION Take 1 tablet by mouth daily as needed (acid reducer). OTC Equate brand Acid Reducer    . topiramate (TOPAMAX) 50 MG tablet Take 100 mg by mouth daily.    Marland Kitchen SYNTHROID 150 MCG tablet Take 1 tablet by mouth daily before breakfast.     No current facility-administered medications for this visit.     Past Medical History  Diagnosis Date  . Hypertension   . Hyperlipidemia   . Thyroid disease   . Vitamin D deficiency   . Palpitations   . PONV (postoperative nausea and vomiting)   . Hypothyroidism   . GERD (gastroesophageal  reflux disease)   . H/O hiatal hernia   . Dysrhythmia     palpitations occ; SVT 09/2013 s/p adenosine  . Hx of cardiovascular stress test     Lexiscan Myoview 6/16: Ejection fraction is 60% and wall motion is normal. The study is normal. There is no scar or ischemia. This is a low risk scan.  . Basal cell carcinoma     "face, hands, chest" (04/08/2015)  . Pneumonia 2015 X 1  . Chronic bronchitis     "get it pretty much q yr" (04/08/2015)  . Arthritis     "knees, hands, ankles, ~ every joint" (04/08/2015)    ROS:   All systems reviewed and negative except as noted in the HPI.   Past Surgical History  Procedure Laterality Date  . Carpal tunnel release Right 1990  . Tubal ligation  1987  . Knee arthroscopy Right 2013  . Bladder repair  1993    "lasered the holes shut"  . Endometrial ablation  2009  . Basal cell carcinoma excision  2014    "off my chest"  . Partial knee arthroplasty Right 02/15/2014    Procedure: RIGHT UNICOMPARTMENTAL KNEE (MEDIAL COMPARTMENT);  Surgeon: Vickey Huger, MD;  Location: [facility];  Service: Orthopedics;  Laterality: Right;  . Electrophysiologic study N/A 04/08/2015    Procedure: SVT Ablation;  Surgeon: Evans Lance, MD;  Location: [facility];  Service:  Cardiovascular;  Laterality: N/A;  . Joint replacement    . Incontinence surgery  2010     Family History  Problem Relation Age of Onset  . Hypertension Mother   . Atrial fibrillation Mother   . Diabetes Father   . Emphysema Father   . COPD Father   . Cancer Paternal Aunt     x 4 aunts, breast  . Stroke Maternal Grandmother   . Hyperlipidemia Maternal Grandfather   . Heart disease Maternal Grandfather   . Hyperlipidemia Paternal Grandmother   . Heart disease Paternal Grandmother   . Hyperlipidemia Paternal Grandfather   . Heart disease Paternal Grandfather   . Heart attack Maternal Grandmother      Social History   Social History  . Marital Status: Divorced    Spouse Name: N/A  .  Number of Children: N/A  . Years of Education: N/A   Occupational History  . Not on file.   Social History Main Topics  . Smoking status: Current Every Day Smoker -- 1.50 packs/day for 40 years    Types: Cigarettes  . Smokeless tobacco: Never Used     Comment: 04/08/2015 "went from ~ 2 ppd to 3 cigarettes in 1 week"  . Alcohol Use: 0.0 oz/week    0 Standard drinks or equivalent per week     Comment: 04/08/2015 "couple drinks/year"  . Drug Use: No  . Sexual Activity: Not Currently   Other Topics Concern  . Not on file   Social History Narrative     BP 138/88 mmHg  Pulse 83  Ht 5' 5.5" (1.664 m)  Wt 229 lb (103.874 kg)  BMI 37.51 kg/m2  Physical Exam:  Well appearing 54 yo woman, NAD HEENT: Unremarkable Neck:  6 cm JVD, no thyromegally Back:  No CVA tenderness Lungs:  Clear with no wheezes HEART:  Regular rate rhythm, no murmurs, no rubs, no clicks Abd:  soft, positive bowel sounds, no organomegally, no rebound, no guarding Ext:  2 plus pulses, no edema, no cyanosis, no clubbing Skin:  No rashes no nodules Neuro:  CN II through XII intact, motor grossly intact  EKG - nsr with no pre-excitation  Assess/Plan:

## 2015-05-10 NOTE — Patient Instructions (Signed)
Medication Instructions:  Your physician recommends that you continue on your current medications as directed. Please refer to the Current Medication list given to you today.   Labwork: None ordered  Testing/Procedures: None ordered  Follow-Up: Your physician recommends that you schedule a follow-up appointment in: as needed     Any Other Special Instructions Will Be Listed Below (If Applicable).

## 2015-06-16 ENCOUNTER — Ambulatory Visit: Payer: Self-pay | Admitting: Internal Medicine

## 2015-07-05 ENCOUNTER — Other Ambulatory Visit: Payer: Self-pay | Admitting: *Deleted

## 2015-07-05 ENCOUNTER — Encounter: Payer: Self-pay | Admitting: Internal Medicine

## 2015-07-05 ENCOUNTER — Ambulatory Visit (INDEPENDENT_AMBULATORY_CARE_PROVIDER_SITE_OTHER): Payer: Commercial Managed Care - HMO | Admitting: Internal Medicine

## 2015-07-05 VITALS — BP 138/94 | HR 100 | Temp 98.0°F | Resp 18 | Ht 66.0 in | Wt 231.0 lb

## 2015-07-05 DIAGNOSIS — I10 Essential (primary) hypertension: Secondary | ICD-10-CM | POA: Diagnosis not present

## 2015-07-05 DIAGNOSIS — Z9889 Other specified postprocedural states: Secondary | ICD-10-CM | POA: Diagnosis not present

## 2015-07-05 DIAGNOSIS — Z8679 Personal history of other diseases of the circulatory system: Secondary | ICD-10-CM | POA: Diagnosis not present

## 2015-07-05 DIAGNOSIS — J069 Acute upper respiratory infection, unspecified: Secondary | ICD-10-CM

## 2015-07-05 DIAGNOSIS — E785 Hyperlipidemia, unspecified: Secondary | ICD-10-CM

## 2015-07-05 DIAGNOSIS — Z79899 Other long term (current) drug therapy: Secondary | ICD-10-CM

## 2015-07-05 LAB — CBC WITH DIFFERENTIAL/PLATELET
BASOS ABS: 0 10*3/uL (ref 0.0–0.1)
Basophils Relative: 0 % (ref 0–1)
EOS ABS: 0.1 10*3/uL (ref 0.0–0.7)
EOS PCT: 1 % (ref 0–5)
HCT: 39.5 % (ref 36.0–46.0)
Hemoglobin: 13.8 g/dL (ref 12.0–15.0)
LYMPHS ABS: 1.6 10*3/uL (ref 0.7–4.0)
Lymphocytes Relative: 27 % (ref 12–46)
MCH: 32.3 pg (ref 26.0–34.0)
MCHC: 34.9 g/dL (ref 30.0–36.0)
MCV: 92.5 fL (ref 78.0–100.0)
MPV: 11 fL (ref 8.6–12.4)
Monocytes Absolute: 0.6 10*3/uL (ref 0.1–1.0)
Monocytes Relative: 10 % (ref 3–12)
Neutro Abs: 3.7 10*3/uL (ref 1.7–7.7)
Neutrophils Relative %: 62 % (ref 43–77)
PLATELETS: 242 10*3/uL (ref 150–400)
RBC: 4.27 MIL/uL (ref 3.87–5.11)
RDW: 13.1 % (ref 11.5–15.5)
WBC: 6 10*3/uL (ref 4.0–10.5)

## 2015-07-05 LAB — BASIC METABOLIC PANEL WITH GFR
BUN: 11 mg/dL (ref 7–25)
CALCIUM: 9.5 mg/dL (ref 8.6–10.4)
CO2: 25 mmol/L (ref 20–31)
Chloride: 102 mmol/L (ref 98–110)
Creat: 0.78 mg/dL (ref 0.50–1.05)
GFR, Est Non African American: 88 mL/min (ref >=60)
Glucose, Bld: 100 mg/dL — ABNORMAL HIGH (ref 65–99)
POTASSIUM: 4.2 mmol/L (ref 3.5–5.3)
Sodium: 138 mmol/L (ref 135–146)

## 2015-07-05 LAB — HEPATIC FUNCTION PANEL
ALBUMIN: 4.1 g/dL (ref 3.6–5.1)
ALK PHOS: 92 U/L (ref 33–130)
ALT: 22 U/L (ref 6–29)
AST: 19 U/L (ref 10–35)
BILIRUBIN TOTAL: 0.4 mg/dL (ref 0.2–1.2)
Bilirubin, Direct: 0.1 mg/dL (ref <=0.2)
Indirect Bilirubin: 0.3 mg/dL (ref 0.2–1.2)
Total Protein: 6.9 g/dL (ref 6.1–8.1)

## 2015-07-05 LAB — LIPID PANEL
CHOLESTEROL: 164 mg/dL (ref 125–200)
HDL: 35 mg/dL — AB (ref >=46)
LDL CALC: 98 mg/dL (ref <130)
TRIGLYCERIDES: 154 mg/dL — AB (ref <150)
Total CHOL/HDL Ratio: 4.7 Ratio (ref <=5.0)
VLDL: 31 mg/dL — ABNORMAL HIGH (ref <30)

## 2015-07-05 LAB — TSH: TSH: 2.898 u[IU]/mL (ref 0.350–4.500)

## 2015-07-05 MED ORDER — LEVOTHYROXINE SODIUM 150 MCG PO TABS: 150.0000 ug | ORAL_TABLET | Freq: Every day | ORAL | Status: DC

## 2015-07-05 MED ORDER — PREDNISONE 20 MG PO TABS: ORAL_TABLET | ORAL | Status: DC

## 2015-07-05 MED ORDER — IPRATROPIUM BROMIDE 0.03 % NA SOLN: 2.0000 | Freq: Three times a day (TID) | NASAL | Status: DC

## 2015-07-05 MED ORDER — AZITHROMYCIN 250 MG PO TABS: ORAL_TABLET | ORAL | Status: DC

## 2015-07-05 NOTE — Progress Notes (Signed)
Patient ID: Sherry Dennis, female   DOB: 12-01-1960, 54 y.o.   MRN: [id number]  Assessment and Plan:  Hypertension:  -Continue medication,  -monitor blood pressure at home.  -Continue DASH diet.   -Reminder to go to the ER if any CP, SOB, nausea, dizziness, severe HA, changes vision/speech, left arm numbness and tingling, and jaw pain.  Cholesterol: -Continue diet and exercise.  -Check cholesterol.   Pre-diabetes: -Continue diet and exercise.  -Check A1C  Vitamin D Def: -check level -continue medications.   TSH -check level -go back to generic  Acute URI -prednisone -zpak -atrovent nose spray -mucinex   Continue diet and meds as discussed. Further disposition pending results of labs.  HPI 54 y.o. female  presents for 3 month follow up with hypertension, hyperlipidemia, prediabetes and vitamin D.  Patient is status post recent cardiac ablation. She reports that since the ablation she has had no palpitations since the ablation and she is feeling a lot better. She reports that she has been active since then.     Her blood pressure has been controlled at home, today their BP is BP: (!) 138/94 mmHg.   She does workout. She denies chest pain, shortness of breath, dizziness.  She reports that she is busy at work, but she isn't doing much at home.     She is on cholesterol medication and denies myalgias. Her cholesterol is at goal. The cholesterol last visit was:   Lab Results  Component Value Date   CHOL 157 03/15/2015   HDL 37* 03/15/2015   LDLCALC 79 03/15/2015   TRIG 204* 03/15/2015   CHOLHDL 4.2 03/15/2015     She has been working on diet and exercise for prediabetes, and denies foot ulcerations, hyperglycemia, hypoglycemia , increased appetite, nausea, paresthesia of the feet, polydipsia, polyuria, visual disturbances, vomiting and weight loss. Last A1C in the office was:  Lab Results  Component Value Date   HGBA1C 6.0* 03/15/2015    Patient is on Vitamin D  supplement.  Lab Results  Component Value Date   VD25OH 23* 03/15/2015     Patient reports that she has been having some upper respiratory symptoms of congestion and coughing and fatigue that has been going on for 2 weeks.  She reports that a lot of family members had something similar.  Dry cough at home.  She has been taking Vitamin C and mucinex cough syrup.    Current Medications:  Current Outpatient Prescriptions on File Prior to Visit  Medication Sig Dispense Refill  . fluticasone (FLONASE) 50 MCG/ACT nasal spray Place 1 spray into both nostrils daily.    . hydrochlorothiazide (HYDRODIURIL) 25 MG tablet Take 1 tablet (25 mg total) by mouth daily as needed (fluid). 45 tablet 3  . ibuprofen (ADVIL,MOTRIN) 800 MG tablet Take 800 mg by mouth every 8 (eight) hours as needed for moderate pain.    . metoprolol tartrate (LOPRESSOR) 25 MG tablet Take 0.5 tablets (12.5 mg total) by mouth daily as needed (heart palipations). 30 tablet 11  . Multiple Vitamins-Minerals (EMERGEN-C FIVE PO) Take 1 Package by mouth daily.    Marland Kitchen OVER THE COUNTER MEDICATION Take 1 tablet by mouth every morning. Equate brand allergy Rx    . OVER THE COUNTER MEDICATION Take 1 tablet by mouth daily as needed (acid reducer). OTC Equate brand Acid Reducer    . SYNTHROID 150 MCG tablet Take 1 tablet by mouth daily before breakfast.    . topiramate (TOPAMAX) 50 MG tablet Take  100 mg by mouth daily.     No current facility-administered medications on file prior to visit.    Medical History:  Past Medical History  Diagnosis Date  . Hypertension   . Hyperlipidemia   . Thyroid disease   . Vitamin D deficiency   . Palpitations   . PONV (postoperative nausea and vomiting)   . Hypothyroidism   . GERD (gastroesophageal reflux disease)   . H/O hiatal hernia   . Dysrhythmia     palpitations occ; SVT 09/2013 s/p adenosine  . Hx of cardiovascular stress test     Lexiscan Myoview 6/16: Ejection fraction is 60% and wall motion is  normal. The study is normal. There is no scar or ischemia. This is a low risk scan.  . Basal cell carcinoma     "face, hands, chest" (04/08/2015)  . Pneumonia 2015 X 1  . Chronic bronchitis ([place])     "get it pretty much q yr" (04/08/2015)  . Arthritis     "knees, hands, ankles, ~ every joint" (04/08/2015)    Allergies:  Allergies  Allergen Reactions  . Darvocet [Propoxyphene N-Acetaminophen] Anaphylaxis  . Darvon [Propoxyphene Hcl] Anaphylaxis and Other (See Comments)    Airway swelling   . Tamiflu [Oseltamivir Phosphate] Other (See Comments)    Increased blood pressure Hives      Review of Systems:  Review of Systems  Constitutional: Positive for chills and malaise/fatigue. Negative for fever.  HENT: Positive for congestion and ear pain.   Eyes: Negative.   Respiratory: Positive for cough. Negative for sputum production, shortness of breath and wheezing.   Cardiovascular: Negative for chest pain, palpitations and leg swelling.  Gastrointestinal: Positive for constipation. Negative for heartburn, diarrhea, blood in stool and melena.  Genitourinary: Negative.   Neurological: Positive for headaches. Negative for dizziness, sensory change and loss of consciousness.  Psychiatric/Behavioral: Negative for depression. The patient is not nervous/anxious and does not have insomnia.     Family history- Review and unchanged  Social history- Review and unchanged  Physical Exam: BP 138/94 mmHg  Pulse 100  Temp(Src) 98 F (36.7 C) (Temporal)  Resp 18  Ht 5\' 6"  (1.676 m)  Wt 231 lb (104.781 kg)  BMI 37.30 kg/m2  SpO2 96% Wt Readings from Last 3 Encounters:  07/05/15 231 lb (104.781 kg)  05/10/15 229 lb (103.874 kg)  04/08/15 224 lb (101.606 kg)    General Appearance: Well nourished well developed, in no apparent distress. Eyes: PERRLA, EOMs, conjunctiva no swelling or erythema ENT/Mouth: Ear canals normal without obstruction, swelling, erythma, discharge.  TMs normal  bilaterally.  Oropharynx moist, clear, without exudate, or postoropharyngeal swelling. Neck: Supple, thyroid normal,no cervical adenopathy  Respiratory: Respiratory effort normal, Breath sounds clear A&P without rhonchi, wheeze, or rale.  No retractions, no accessory usage. Cardio: RRR with no MRGs. Brisk peripheral pulses without edema.  Abdomen: Soft, + BS,  Non tender, no guarding, rebound, hernias, masses. Musculoskeletal: Full ROM, 5/5 strength, Normal gait Skin: Warm, dry without rashes, lesions, ecchymosis.  Neuro: Awake and oriented X 3, Cranial nerves intact. Normal muscle tone, no cerebellar symptoms. Psych: Normal affect, Insight and Judgment appropriate.    Starlyn Skeans, PA-C 10:40 AM [facility]

## 2015-07-21 ENCOUNTER — Encounter: Payer: Self-pay | Admitting: Physician Assistant

## 2015-07-21 ENCOUNTER — Other Ambulatory Visit: Payer: Self-pay

## 2015-07-21 ENCOUNTER — Ambulatory Visit (INDEPENDENT_AMBULATORY_CARE_PROVIDER_SITE_OTHER): Payer: Commercial Managed Care - HMO | Admitting: Physician Assistant

## 2015-07-21 ENCOUNTER — Ambulatory Visit: Payer: Self-pay | Admitting: Internal Medicine

## 2015-07-21 VITALS — BP 150/100 | HR 97 | Temp 97.7°F | Resp 16 | Ht 66.0 in | Wt 227.0 lb

## 2015-07-21 DIAGNOSIS — J0101 Acute recurrent maxillary sinusitis: Secondary | ICD-10-CM | POA: Diagnosis not present

## 2015-07-21 DIAGNOSIS — I10 Essential (primary) hypertension: Secondary | ICD-10-CM

## 2015-07-21 MED ORDER — PREDNISONE 20 MG PO TABS: ORAL_TABLET | ORAL | Status: DC

## 2015-07-21 MED ORDER — LEVOFLOXACIN 500 MG PO TABS: 500.0000 mg | ORAL_TABLET | Freq: Every day | ORAL | Status: DC

## 2015-07-21 MED ORDER — FLUCONAZOLE 150 MG PO TABS: 150.0000 mg | ORAL_TABLET | Freq: Every day | ORAL | Status: DC

## 2015-07-21 MED ORDER — METOPROLOL TARTRATE 25 MG PO TABS: 25.0000 mg | ORAL_TABLET | Freq: Two times a day (BID) | ORAL | Status: DC

## 2015-07-21 MED ORDER — PROMETHAZINE-CODEINE 6.25-10 MG/5ML PO SYRP: 5.0000 mL | ORAL_SOLUTION | Freq: Four times a day (QID) | ORAL | Status: DC | PRN

## 2015-07-21 NOTE — Progress Notes (Signed)
Subjective:    Patient ID: Sherry Dennis, female    DOB: 1961-05-17, 54 y.o.   MRN: [id number]  HPI 54 y.o. smoking WF treated with zpak and prednisone 11/22, states she was feeling better and then got worse. She has recurrent sinus infections. Has sinus pressure, chills, teeth pain, mild cough without mucus, no CP, SOB.   Blood pressure is very elevated today, denies CV symptoms, she has been on mucinex D.   Blood pressure 150/100, pulse 97, temperature 97.7 F (36.5 C), temperature source Temporal, resp. rate 16, height 5\' 6"  (1.676 m), weight 227 lb (102.967 kg), SpO2 97 %. Current Outpatient Prescriptions on File Prior to Visit  Medication Sig Dispense Refill  . fluticasone (FLONASE) 50 MCG/ACT nasal spray Place 1 spray into both nostrils daily.    . hydrochlorothiazide (HYDRODIURIL) 25 MG tablet Take 1 tablet (25 mg total) by mouth daily as needed (fluid). 45 tablet 3  . ibuprofen (ADVIL,MOTRIN) 800 MG tablet Take 800 mg by mouth every 8 (eight) hours as needed for moderate pain.    Marland Kitchen ipratropium (ATROVENT) 0.03 % nasal spray Place 2 sprays into the nose 3 (three) times daily. 30 mL 2  . levothyroxine (SYNTHROID) 150 MCG tablet Take 1 tablet (150 mcg total) by mouth daily before breakfast. 90 tablet 1  . metoprolol tartrate (LOPRESSOR) 25 MG tablet Take 0.5 tablets (12.5 mg total) by mouth daily as needed (heart palipations). 30 tablet 11  . Multiple Vitamins-Minerals (EMERGEN-C FIVE PO) Take 1 Package by mouth daily.    Marland Kitchen OVER THE COUNTER MEDICATION Take 1 tablet by mouth every morning. Equate brand allergy Rx    . OVER THE COUNTER MEDICATION Take 1 tablet by mouth daily as needed (acid reducer). OTC Equate brand Acid Reducer    . topiramate (TOPAMAX) 50 MG tablet Take 100 mg by mouth daily.     No current facility-administered medications on file prior to visit.   Past Medical History  Diagnosis Date  . Hypertension   . Hyperlipidemia   . Thyroid disease   . Vitamin D  deficiency   . Palpitations   . PONV (postoperative nausea and vomiting)   . Hypothyroidism   . GERD (gastroesophageal reflux disease)   . H/O hiatal hernia   . Dysrhythmia     palpitations occ; SVT 09/2013 s/p adenosine  . Hx of cardiovascular stress test     Lexiscan Myoview 6/16: Ejection fraction is 60% and wall motion is normal. The study is normal. There is no scar or ischemia. This is a low risk scan.  . Basal cell carcinoma     "face, hands, chest" (04/08/2015)  . Pneumonia 2015 X 1  . Chronic bronchitis ([place])     "get it pretty much q yr" (04/08/2015)  . Arthritis     "knees, hands, ankles, ~ every joint" (04/08/2015)    Review of Systems  Constitutional: Negative for chills and diaphoresis.  HENT: Positive for congestion, postnasal drip, sinus pressure and sneezing. Negative for ear pain and sore throat.   Respiratory: Positive for cough. Negative for chest tightness, shortness of breath and wheezing.   Cardiovascular: Negative.   Gastrointestinal: Negative.   Genitourinary: Negative.   Musculoskeletal: Negative for neck pain.  Neurological: Negative for headaches.       Objective:   Physical Exam  Constitutional: She appears well-developed and well-nourished.  HENT:  Head: Normocephalic and atraumatic.  Right Ear: External ear normal.  Nose: Right sinus exhibits maxillary sinus tenderness.  Right sinus exhibits no frontal sinus tenderness. Left sinus exhibits maxillary sinus tenderness. Left sinus exhibits no frontal sinus tenderness.  Eyes: Conjunctivae and EOM are normal.  Neck: Normal range of motion. Neck supple.  Cardiovascular: Normal rate, regular rhythm, normal heart sounds and intact distal pulses.   Pulmonary/Chest: Effort normal and breath sounds normal. No respiratory distress. She has no wheezes.  Abdominal: Soft. Bowel sounds are normal.  Lymphadenopathy:    She has cervical adenopathy.  Skin: Skin is warm and dry.       Assessment & Plan:  1.  Acute recurrent maxillary sinusitis With recurrent sinus infection will refer to ENT  Advised to quit smoking, continue allergy med and flonase.  - levofloxacin (LEVAQUIN) 500 MG tablet; Take 1 tablet (500 mg total) by mouth daily.  Dispense: 10 tablet; Refill: 0 - predniSONE (DELTASONE) 20 MG tablet; 2 tablets daily for 3 days, 1 tablet daily for 4 days.  Dispense: 10 tablet; Refill: 0 - fluconazole (DIFLUCAN) 150 MG tablet; Take 1 tablet (150 mg total) by mouth daily.  Dispense: 1 tablet; Refill: 3 - promethazine-codeine (PHENERGAN WITH CODEINE) 6.25-10 MG/5ML syrup; Take 5 mLs by mouth every 6 (six) hours as needed for cough. Max: 38mL per day  Dispense: 240 mL; Refill: 0 - Ambulatory referral to ENT  2. Essential hypertension Get on metoprolol daily, monitor at home, follow up 1 month.   3. Smoking cessation -  instruction/counseling given, counseled patient on the dangers of tobacco use, advised patient to stop smoking, and reviewed strategies to maximize success, patient not ready to quit at this time.

## 2015-07-21 NOTE — Patient Instructions (Signed)
Monitor your blood pressure at home. Go to the ER if any CP, SOB, nausea, dizziness, severe HA, changes vision/speech  Goal BP:  For patients younger than 60: Goal BP < 140/90. For patients 60 and older: Goal BP < 150/90. For patients with diabetes: Goal BP < 140/90. Your most recent BP: BP: (!) 150/100 mmHg   Take your medications faithfully as instructed. Maintain a healthy weight. Get at least 150 minutes of aerobic exercise per week. Minimize salt intake. Minimize alcohol intake  DASH Eating Plan DASH stands for "Dietary Approaches to Stop Hypertension." The DASH eating plan is a healthy eating plan that has been shown to reduce high blood pressure (hypertension). Additional health benefits may include reducing the risk of type 2 diabetes mellitus, heart disease, and stroke. The DASH eating plan may also help with weight loss. WHAT DO I NEED TO KNOW ABOUT THE DASH EATING PLAN? For the DASH eating plan, you will follow these general guidelines:  Choose foods with a percent daily value for sodium of less than 5% (as listed on the food label).  Use salt-free seasonings or herbs instead of table salt or sea salt.  Check with your health care provider or pharmacist before using salt substitutes.  Eat lower-sodium products, often labeled as "lower sodium" or "no salt added."  Eat fresh foods.  Eat more vegetables, fruits, and low-fat dairy products.  Choose whole grains. Look for the word "whole" as the first word in the ingredient list.  Choose fish and skinless chicken or [place] more often than red meat. Limit fish, poultry, and meat to 6 oz (170 g) each day.  Limit sweets, desserts, sugars, and sugary drinks.  Choose heart-healthy fats.  Limit cheese to 1 oz (28 g) per day.  Eat more home-cooked food and less restaurant, buffet, and fast food.  Limit fried foods.  Cumbie foods using methods other than frying.  Limit canned vegetables. If you do use them, rinse them well  to decrease the sodium.  When eating at a restaurant, ask that your food be prepared with less salt, or no salt if possible. WHAT FOODS CAN I EAT? Seek help from a dietitian for individual calorie needs. Grains Whole grain or whole wheat bread. Brown rice. Whole grain or whole wheat pasta. Quinoa, bulgur, and whole grain cereals. Low-sodium cereals. Corn or whole wheat flour tortillas. Whole grain cornbread. Whole grain crackers. Low-sodium crackers. Vegetables Fresh or frozen vegetables (raw, steamed, roasted, or grilled). Low-sodium or reduced-sodium tomato and vegetable juices. Low-sodium or reduced-sodium tomato sauce and paste. Low-sodium or reduced-sodium canned vegetables.  Fruits All fresh, canned (in natural juice), or frozen fruits. Meat and Other Protein Products Ground beef (85% or leaner), grass-fed beef, or beef trimmed of fat. Skinless chicken or [place]. Ground chicken or [place]. Pork trimmed of fat. All fish and seafood. Eggs. Dried beans, peas, or lentils. Unsalted nuts and seeds. Unsalted canned beans. Dairy Low-fat dairy products, such as skim or 1% milk, 2% or reduced-fat cheeses, low-fat ricotta or cottage cheese, or plain low-fat yogurt. Low-sodium or reduced-sodium cheeses. Fats and Oils Tub margarines without trans fats. Light or reduced-fat mayonnaise and salad dressings (reduced sodium). Avocado. Safflower, olive, or canola oils. Natural peanut or almond butter. Other Unsalted popcorn and pretzels. The items listed above may not be a complete list of recommended foods or beverages. Contact your dietitian for more options. WHAT FOODS ARE NOT RECOMMENDED? Grains White bread. White pasta. White rice. Refined cornbread. Bagels and croissants. Crackers that  contain trans fat. Vegetables Creamed or fried vegetables. Vegetables in a cheese sauce. Regular canned vegetables. Regular canned tomato sauce and paste. Regular tomato and vegetable juices. Fruits Dried fruits.  Canned fruit in light or heavy syrup. Fruit juice. Meat and Other Protein Products Fatty cuts of meat. Ribs, chicken wings, bacon, sausage, bologna, salami, chitterlings, fatback, hot dogs, bratwurst, and packaged luncheon meats. Salted nuts and seeds. Canned beans with salt. Dairy Whole or 2% milk, cream, half-and-half, and cream cheese. Whole-fat or sweetened yogurt. Full-fat cheeses or blue cheese. Nondairy creamers and whipped toppings. Processed cheese, cheese spreads, or cheese curds. Condiments Onion and garlic salt, seasoned salt, table salt, and sea salt. Canned and packaged gravies. Worcestershire sauce. Tartar sauce. Barbecue sauce. Teriyaki sauce. Soy sauce, including reduced sodium. Steak sauce. Fish sauce. Oyster sauce. Cocktail sauce. Horseradish. Ketchup and mustard. Meat flavorings and tenderizers. Bouillon cubes. Hot sauce. Tabasco sauce. Marinades. Taco seasonings. Relishes. Fats and Oils Butter, stick margarine, lard, shortening, ghee, and bacon fat. Coconut, palm kernel, or palm oils. Regular salad dressings. Other Pickles and olives. Salted popcorn and pretzels. The items listed above may not be a complete list of foods and beverages to avoid. Contact your dietitian for more information. WHERE CAN I FIND MORE INFORMATION? National Heart, Lung, and [employer] Institute: travelstabloid.com Document Released: 07/19/2011 Document Revised: 12/14/2013 Document Reviewed: 06/03/2013 [facility] Patient Information 2015 [place], [place]. This information is not intended to replace advice given to you by your health care provider. Make sure you discuss any questions you have with your health care provider.  Sinusitis can be uncomfortable. People with sinusitis have congestion with yellow/green/gray discharge, sinus pain/pressure, pain around the eyes. Sinus infections almost ALWAYS stem from a viral infection and antibiotics don't work against a virus. Even when  bacteria is responsible, the infections usually clear up on their own in a week or so.   PLEASE TRY TO DO OVER THE COUNTER TREATMENT AND PREDNISONE FOR 5-7 DAYS AND IF YOU ARE NOT GETTING BETTER OR GETTING WORSE THEN YOU CAN START ON AN ANTIBIOTIC GIVEN.  Can take the prednisone AT NIGHT WITH DINNER, it take 8-12 hours to start working so it will NOT affect your sleeping if you take it at night with your food!! Take two pills the first night and 1 or two pill the second night and then 1 pill the other nights.   Risk of antibiotic use: About 1 in 4 people who take antibiotics have side effects including stomach problems, dizziness, or rashes. Those problems clear up soon after stopping the drugs, but in rare cases antibiotics can cause severe allergic reaction. Over use of antibiotics also encourages the growth of bacteria that can't be controlled easily with drugs. That makes you more vunerable to antibiotic-resistant infections and undermines the benefits of antibiotics for others.   Waste of Money: Antibiotics often aren't very expensive, but any money spent on unnecessary drugs is money down the drain.   When are antibiotics needed? Only when symptoms last longer than a week.  Start to improve but then worsen again  -It can take up to 2 weeks to feel better.   -If you do not get better in 7-10 days (Have fever, facial pain, dental pain and swelling), then please call the office and it is now appropriate to start an antibiotic.   -Please take Tylenol or Ibuprofen for pain. -Acetaminiphen 325mg  orally every 4-6 hours for pain.  Max: 10 per day -Ibuprofen 200mg  orally every 6-8 hours for pain.  Take with food to avoid ulcers.   Max 10 per day  Please pick one of the over the counter allergy medications below and take it once daily for allergies.  Claritin or loratadine cheapest but likely the weakest  Zyrtec or certizine at night because it can make you sleepy The strongest is allegra or  fexafinadine  Cheapest at [employer], [employer], [employer]  -While drinking fluids, pinch and hold nose close and swallow.  This will help open up your eustachian tubes to drain the fluid behind your ear drums. -Try steam showers to open your nasal passages.   Drink lots of water to stay hydrated and to thin mucous.  Flonase/Nasonex is to help the inflammation.  Take 2 sprays in each nostril at bedtime.  Make sure you spray towards the outside of each nostril towards the outer corner of your eye, hold nose close and tilt head back.  This will help the medication get into your sinuses.  If you do not like this medication, then use saline nasal sprays same directions as above for Flonase. Stop the medication right away if you get blurring of your vision or nose bleeds.  Sinusitis Sinusitis is redness, soreness, and inflammation of the paranasal sinuses. Paranasal sinuses are air pockets within the bones of your face (beneath the eyes, the middle of the forehead, or above the eyes). In healthy paranasal sinuses, mucus is able to drain out, and air is able to circulate through them by way of your nose. However, when your paranasal sinuses are inflamed, mucus and air can become trapped. This can allow bacteria and other germs to grow and cause infection. Sinusitis can develop quickly and last only a short time (acute) or continue over a long period (chronic). Sinusitis that lasts for more than 12 weeks is considered chronic.  CAUSES  Causes of sinusitis include: Allergies. Structural abnormalities, such as displacement of the cartilage that separates your nostrils (deviated septum), which can decrease the air flow through your nose and sinuses and affect sinus drainage. Functional abnormalities, such as when the small hairs (cilia) that line your sinuses and help remove mucus do not work properly or are not present. SIGNS AND SYMPTOMS  Symptoms of acute and chronic sinusitis are the same. The primary symptoms are  pain and pressure around the affected sinuses. Other symptoms include: Upper toothache. Earache. Headache. Bad breath. Decreased sense of smell and taste. A cough, which worsens when you are lying flat. Fatigue. Fever. Thick drainage from your nose, which often is green and may contain pus (purulent). Swelling and warmth over the affected sinuses. DIAGNOSIS  Your health care provider will perform a physical exam. During the exam, your health care provider may: Look in your nose for signs of abnormal growths in your nostrils (nasal polyps).  Tap over the affected sinus to check for signs of infection. View the inside of your sinuses (endoscopy) using an imaging device that has a light attached (endoscope). If your health care provider suspects that you have chronic sinusitis, one or more of the following tests may be recommended: Allergy tests. Nasal culture. A sample of mucus is taken from your nose, sent to a lab, and screened for bacteria. Nasal cytology. A sample of mucus is taken from your nose and examined by your health care provider to determine if your sinusitis is related to an allergy. TREATMENT  Most cases of acute sinusitis are related to a viral infection and will resolve on their own within 10 days. Sometimes  medicines are prescribed to help relieve symptoms (pain medicine, decongestants, nasal steroid sprays, or saline sprays).  However, for sinusitis related to a bacterial infection, your health care provider will prescribe antibiotic medicines. These are medicines that will help kill the bacteria causing the infection.  Rarely, sinusitis is caused by a fungal infection. In theses cases, your health care provider will prescribe antifungal medicine. For some cases of chronic sinusitis, surgery is needed. Generally, these are cases in which sinusitis recurs more than 3 times per year, despite other treatments. HOME CARE INSTRUCTIONS  Drink plenty of water. Water helps thin the  mucus so your sinuses can drain more easily. Use a humidifier. Inhale steam 3 to 4 times a day (for example, sit in the bathroom with the shower running). Apply a warm, moist washcloth to your face 3 to 4 times a day, or as directed by your health care provider. Use saline nasal sprays to help moisten and clean your sinuses. Take medicines only as directed by your health care provider. If you were prescribed either an antibiotic or antifungal medicine, finish it all even if you start to feel better. SEEK IMMEDIATE MEDICAL CARE IF: You have increasing pain or severe headaches. You have nausea, vomiting, or drowsiness. You have swelling around your face. You have vision problems. You have a stiff neck. You have difficulty breathing. MAKE SURE YOU:  Understand these instructions. Will watch your condition. Will get help right away if you are not doing well or get worse. Document Released: 07/30/2005 Document Revised: 12/14/2013 Document Reviewed: 08/14/2011 [facility] Patient Information 2015 [place], [place]. This information is not intended to replace advice given to you by your health care provider. Make sure you discuss any questions you have with your health care provider.

## 2015-07-28 ENCOUNTER — Other Ambulatory Visit: Payer: Self-pay | Admitting: Otolaryngology

## 2015-07-28 DIAGNOSIS — R51 Headache: Secondary | ICD-10-CM

## 2015-07-28 DIAGNOSIS — R519 Headache, unspecified: Secondary | ICD-10-CM

## 2015-07-28 DIAGNOSIS — R0981 Nasal congestion: Secondary | ICD-10-CM

## 2015-08-08 ENCOUNTER — Encounter: Payer: Self-pay | Admitting: *Deleted

## 2015-08-11 ENCOUNTER — Ambulatory Visit
Admission: RE | Admit: 2015-08-11 | Discharge: 2015-08-11 | Disposition: A | Discharge status: Home / Self Care | Payer: Commercial Managed Care - HMO | Source: Ambulatory Visit | Attending: Otolaryngology | Admitting: Otolaryngology

## 2015-08-11 DIAGNOSIS — R51 Headache: Secondary | ICD-10-CM

## 2015-08-11 DIAGNOSIS — R519 Headache, unspecified: Secondary | ICD-10-CM

## 2015-08-11 DIAGNOSIS — R0981 Nasal congestion: Secondary | ICD-10-CM

## 2015-08-14 HISTORY — PX: CARDIAC CATHETERIZATION: SHX172

## 2015-09-21 ENCOUNTER — Encounter: Payer: Self-pay | Admitting: *Deleted

## 2015-09-30 ENCOUNTER — Ambulatory Visit: Payer: Commercial Managed Care - HMO | Admitting: Internal Medicine

## 2015-10-03 ENCOUNTER — Encounter: Payer: Self-pay | Admitting: Internal Medicine

## 2015-12-08 ENCOUNTER — Encounter: Payer: Self-pay | Admitting: Internal Medicine

## 2016-01-30 ENCOUNTER — Ambulatory Visit (INDEPENDENT_AMBULATORY_CARE_PROVIDER_SITE_OTHER): Payer: Commercial Managed Care - HMO | Admitting: Internal Medicine

## 2016-01-30 ENCOUNTER — Encounter: Payer: Self-pay | Admitting: Internal Medicine

## 2016-01-30 VITALS — BP 146/100 | HR 100 | Temp 98.2°F | Resp 18 | Ht 66.0 in | Wt 232.0 lb

## 2016-01-30 DIAGNOSIS — Z720 Tobacco use: Secondary | ICD-10-CM | POA: Diagnosis not present

## 2016-01-30 DIAGNOSIS — F411 Generalized anxiety disorder: Secondary | ICD-10-CM | POA: Diagnosis not present

## 2016-01-30 MED ORDER — ALPRAZOLAM 0.5 MG PO TABS: 0.5000 mg | ORAL_TABLET | Freq: Two times a day (BID) | ORAL | Status: AC | PRN

## 2016-01-30 MED ORDER — SERTRALINE HCL 50 MG PO TABS: 50.0000 mg | ORAL_TABLET | Freq: Every day | ORAL | Status: DC

## 2016-01-30 NOTE — Progress Notes (Signed)
   Subjective:    Patient ID: Sherry Dennis, female    DOB: 29-Jan-1961, 55 y.o.   MRN: [id number]  HPI  Patient presents to the office for evaluation of quitting smoking.  She reports that she has been smoking for many years and has tried to quit multiple times.  She does not have an issues with quitting smoking and the cravings but has a hard time dealing with stress when she quits.  She has taken zoloft in the past with good success.  She is quitting with several girlfriends at work.  She reports that she already cannot smoke at home due to grandkids being around.    Review of Systems  Constitutional: Negative for fever, chills and fatigue.  Respiratory: Positive for cough and shortness of breath. Negative for chest tightness.   Cardiovascular: Negative for chest pain and palpitations.  Gastrointestinal: Negative.   Genitourinary: Negative.   Musculoskeletal: Negative.   Psychiatric/Behavioral: Negative for suicidal ideas, behavioral problems, decreased concentration and agitation. The patient is nervous/anxious. The patient is not hyperactive.        Objective:   Physical Exam  Constitutional: She is oriented to person, place, and time. She appears well-developed and well-nourished. No distress.  HENT:  Head: Normocephalic.  Mouth/Throat: Oropharynx is clear and moist. No oropharyngeal exudate.  Eyes: Conjunctivae are normal. No scleral icterus.  Neck: Normal range of motion. Neck supple. No JVD present. No thyromegaly present.  Cardiovascular: Normal rate, regular rhythm, normal heart sounds and intact distal pulses.  Exam reveals no gallop and no friction rub.   No murmur heard. Pulmonary/Chest: Effort normal and breath sounds normal. No respiratory distress. She has no wheezes. She has no rales. She exhibits no tenderness.  Abdominal: Soft. Bowel sounds are normal. She exhibits no distension and no mass. There is no tenderness. There is no rebound and no guarding.  Musculoskeletal:  Normal range of motion.  Lymphadenopathy:    She has no cervical adenopathy.  Neurological: She is alert and oriented to person, place, and time.  Skin: Skin is warm and dry. She is not diaphoretic.  Psychiatric: She has a normal mood and affect. Her behavior is normal. Judgment and thought content normal.  Nursing note and vitals reviewed.         Assessment & Plan:                                               1. Tobacco abuse -discussed quitting -Anthony tobacco abuse line given to patient -discussed conservative measures to try and OTC medications like nicorrette gum or patches.  2. Generalized anxiety disorder -zoloft -xanax -recheck in 6 weeks.

## 2016-02-19 ENCOUNTER — Other Ambulatory Visit: Payer: Self-pay | Admitting: Internal Medicine

## 2016-03-13 ENCOUNTER — Encounter: Payer: Self-pay | Admitting: Internal Medicine

## 2016-03-13 ENCOUNTER — Ambulatory Visit (INDEPENDENT_AMBULATORY_CARE_PROVIDER_SITE_OTHER): Payer: Commercial Managed Care - HMO | Admitting: Internal Medicine

## 2016-03-13 VITALS — BP 120/86 | HR 91 | Temp 97.5°F | Resp 16 | Ht 66.0 in | Wt 233.6 lb

## 2016-03-13 DIAGNOSIS — F17200 Nicotine dependence, unspecified, uncomplicated: Secondary | ICD-10-CM

## 2016-03-13 DIAGNOSIS — F411 Generalized anxiety disorder: Secondary | ICD-10-CM

## 2016-03-13 NOTE — Progress Notes (Signed)
Assessment and Plan:   1. Tobacco use disorder -cont working on quitting -down to 3 cigarettes  2.  Depression and anxiety -cont zoloft  HPI 55 y.o.female presents for 1 month follow up of anxiety and also quitting smoking.  She reports that she is down to 3 cigarettes per day.  She did have a small backslide this weekend when she moved her mother into the nursing home.  She has been taking the zoloft daily.  She feels like she has done really well with it.  She feels like her anxiety is a lot better.  She has only had to take the xanax.  Patient reports that they have been doing well.  female is taking their medication.  They are not having difficulty with their medications.  They report no adverse reactions.     Past Medical History:  Diagnosis Date  . Arthritis    "knees, hands, ankles, ~ every joint" (04/08/2015)  . Basal cell carcinoma    "face, hands, chest" (04/08/2015)  . Chronic bronchitis ([place])    "get it pretty much q yr" (04/08/2015)  . Dysrhythmia    palpitations occ; SVT 09/2013 s/p adenosine  . GERD (gastroesophageal reflux disease)   . H/O hiatal hernia   . Hx of cardiovascular stress test    Lexiscan Myoview 6/16: Ejection fraction is 60% and wall motion is normal. The study is normal. There is no scar or ischemia. This is a low risk scan.  . Hyperlipidemia   . Hypertension   . Hypothyroidism   . Palpitations   . Pneumonia 2015 X 1  . PONV (postoperative nausea and vomiting)   . Thyroid disease   . Vitamin D deficiency      Allergies  Allergen Reactions  . Darvocet [Propoxyphene N-Acetaminophen] Anaphylaxis  . Darvon [Propoxyphene Hcl] Anaphylaxis and Other (See Comments)    Airway swelling   . Propoxyphene Anaphylaxis    Throat closes up  . Oseltamivir Hives    Hives, elevated BP  . Tamiflu [Oseltamivir Phosphate] Other (See Comments)    Increased blood pressure Hives       Current Outpatient Prescriptions on File Prior to Visit  Medication Sig  Dispense Refill  . ALPRAZolam (XANAX) 0.5 MG tablet Take 1 tablet (0.5 mg total) by mouth 2 (two) times daily as needed for anxiety or sleep. 60 tablet 1  . fluticasone (FLONASE) 50 MCG/ACT nasal spray Place 1 spray into both nostrils daily.    . hydrochlorothiazide (HYDRODIURIL) 25 MG tablet Take 1 tablet (25 mg total) by mouth daily as needed (fluid). 45 tablet 3  . ibuprofen (ADVIL,MOTRIN) 800 MG tablet Take 800 mg by mouth every 8 (eight) hours as needed for moderate pain.    Marland Kitchen levothyroxine (SYNTHROID, LEVOTHROID) 150 MCG tablet TAKE ONE TABLET BY MOUTH ONCE DAILY BEFORE BREAKFAST 30 tablet 0  . metroNIDAZOLE (METROGEL) 0.75 % gel     . Multiple Vitamins-Minerals (EMERGEN-C FIVE PO) Take 1 Package by mouth daily.    Marland Kitchen OVER THE COUNTER MEDICATION Take 1 tablet by mouth every morning. Equate brand allergy Rx    . OVER THE COUNTER MEDICATION Take 1 tablet by mouth daily as needed (acid reducer). OTC Equate brand Acid Reducer    . sertraline (ZOLOFT) 50 MG tablet Take 1 tablet (50 mg total) by mouth daily. 90 tablet 1  . VOLTAREN 1 % GEL      No current facility-administered medications on file prior to visit.     ROS: all  negative except above.   Physical Exam: Filed Weights   03/13/16 1529  Weight: 233 lb 9.6 oz (106 kg)   BP 120/86   Pulse 91   Temp 97.5 F (36.4 C) (Temporal)   Resp 16   Ht 5\' 6"  (1.676 m)   Wt 233 lb 9.6 oz (106 kg)   SpO2 95%   BMI 37.70 kg/m  General Appearance: Well developed well nourished, non-toxic appearing in no apparent distress. Eyes: PERRLA, EOMs, conjunctiva w/ no swelling or erythema or discharge Sinuses: No Frontal/maxillary tenderness ENT/Mouth: Ear canals clear without swelling or erythema.  TM's normal bilaterally with no retractions, bulging, or loss of landmarks.   Neck: Supple, thyroid normal, no notable JVD  Respiratory: Respiratory effort normal, Clear breath sounds anteriorly and posteriorly bilaterally without rales, rhonchi,  wheezing or stridor. No retractions or accessory muscle usage. Cardio: RRR with no MRGs.   Abdomen: Soft, + BS.  Non tender, no guarding, rebound, hernias, masses.  Musculoskeletal: Full ROM, 5/5 strength, normal gait.  Skin: Warm, dry without rashes  Neuro: Awake and oriented X 3, Cranial nerves intact. Normal muscle tone, no cerebellar symptoms. Sensation intact.  Psych: normal affect, Insight and Judgment appropriate.     Starlyn Skeans, PA-C 3:38 PM [facility]

## 2016-04-20 ENCOUNTER — Other Ambulatory Visit: Payer: Self-pay | Admitting: Internal Medicine

## 2016-05-16 ENCOUNTER — Ambulatory Visit (INDEPENDENT_AMBULATORY_CARE_PROVIDER_SITE_OTHER): Payer: Commercial Managed Care - HMO | Admitting: Internal Medicine

## 2016-05-16 ENCOUNTER — Encounter: Payer: Self-pay | Admitting: Internal Medicine

## 2016-05-16 VITALS — BP 148/86 | HR 76 | Temp 98.4°F | Resp 18 | Ht 66.0 in | Wt 234.0 lb

## 2016-05-16 DIAGNOSIS — I10 Essential (primary) hypertension: Secondary | ICD-10-CM | POA: Diagnosis not present

## 2016-05-16 DIAGNOSIS — E079 Disorder of thyroid, unspecified: Secondary | ICD-10-CM | POA: Diagnosis not present

## 2016-05-16 DIAGNOSIS — F17200 Nicotine dependence, unspecified, uncomplicated: Secondary | ICD-10-CM | POA: Diagnosis not present

## 2016-05-16 DIAGNOSIS — R7303 Prediabetes: Secondary | ICD-10-CM

## 2016-05-16 DIAGNOSIS — Z6837 Body mass index (BMI) 37.0-37.9, adult: Secondary | ICD-10-CM

## 2016-05-16 DIAGNOSIS — E6609 Other obesity due to excess calories: Secondary | ICD-10-CM | POA: Diagnosis not present

## 2016-05-16 DIAGNOSIS — E559 Vitamin D deficiency, unspecified: Secondary | ICD-10-CM | POA: Diagnosis not present

## 2016-05-16 DIAGNOSIS — E782 Mixed hyperlipidemia: Secondary | ICD-10-CM | POA: Diagnosis not present

## 2016-05-16 DIAGNOSIS — E538 Deficiency of other specified B group vitamins: Secondary | ICD-10-CM | POA: Diagnosis not present

## 2016-05-16 DIAGNOSIS — Z79899 Other long term (current) drug therapy: Secondary | ICD-10-CM

## 2016-05-16 DIAGNOSIS — IMO0001 Reserved for inherently not codable concepts without codable children: Secondary | ICD-10-CM

## 2016-05-16 MED ORDER — LINACLOTIDE 72 MCG PO CAPS: 72.0000 ug | ORAL_CAPSULE | Freq: Every day | ORAL | 3 refills | Status: DC

## 2016-05-16 MED ORDER — NALTREXONE-BUPROPION HCL ER 8-90 MG PO TB12: ORAL_TABLET | ORAL | 0 refills | Status: DC

## 2016-05-16 NOTE — Progress Notes (Signed)
Patient ID: Sherry Dennis, female   DOB: 1960-11-21, 55 y.o.   MRN: [id number]  Assessment and Plan:  Hypertension:  -Continue medication,  -monitor blood pressure at home.  -Continue DASH diet.   -Reminder to go to the ER if any CP, SOB, nausea, dizziness, severe HA, changes vision/speech, left arm numbness and tingling, and jaw pain.  Cholesterol: -Continue diet and exercise.  -Check cholesterol.   Pre-diabetes: -Continue diet and exercise.  -Check A1C  Vitamin D Def: -continue medications.   Chronic constipation -linzess 72 mcgs given -prescription card given   Obesity -cont diet and exercise -contrave  Tobacco abuse -cont to cut back on cigarettes.    Continue diet and meds as discussed. Further disposition pending results of labs.  HPI 55 y.o. female  presents for 3 month follow up with hypertension, hyperlipidemia, prediabetes and vitamin D.  She is currently still trying to quit smoking.  She reports that she is down to 2-3 cigarrettes per week.  She reports that she had a mild set back with her mother passing.  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     Her blood pressure has been controlled at home, today their BP is BP: (!) 148/86.   She does not workout. She denies chest pain, shortness of breath, dizziness.   She is not on cholesterol medication and denies myalgias. Her cholesterol is at goal. The cholesterol last visit was:   Lab Results  Component Value Date   CHOL 164 07/05/2015   HDL 35 (L) 07/05/2015   LDLCALC 98 07/05/2015   TRIG 154 (H) 07/05/2015   CHOLHDL 4.7 07/05/2015     She has been working on diet and exercise for prediabetes, and denies foot ulcerations, hyperglycemia, hypoglycemia , increased appetite, nausea, paresthesia of the feet, polydipsia, polyuria, visual disturbances, vomiting and weight loss. Last A1C in the office was:  Lab Results  Component Value Date   HGBA1C 6.0 (H) 03/15/2015    Patient is on Vitamin D  supplement.  Lab Results  Component Value Date   VD25OH 23 (L) 03/15/2015      She reports that she has been taking linzess.  She reports that she was getting samples of linzess from her daughter who is a drug rep.  The 72 mcg is working really well for her.  She would like a prescription for it.      Current Medications:  Current Outpatient Prescriptions on File Prior to Visit  Medication Sig Dispense Refill  . ALPRAZolam (XANAX) 0.5 MG tablet Take 1 tablet (0.5 mg total) by mouth 2 (two) times daily as needed for anxiety or sleep. 60 tablet 1  . fluticasone (FLONASE) 50 MCG/ACT nasal spray Place 1 spray into both nostrils daily.    . hydrochlorothiazide (HYDRODIURIL) 25 MG tablet Take 1 tablet (25 mg total) by mouth daily as needed (fluid). 45 tablet 3  . ibuprofen (ADVIL,MOTRIN) 800 MG tablet Take 800 mg by mouth every 8 (eight) hours as needed for moderate pain.    Marland Kitchen levothyroxine (SYNTHROID, LEVOTHROID) 150 MCG tablet TAKE ONE TABLET BY MOUTH ONCE DAILY BEFORE BREAKFAST 30 tablet 0  . metroNIDAZOLE (METROGEL) 0.75 % gel     . Multiple Vitamins-Minerals (EMERGEN-C FIVE PO) Take 1 Package by mouth daily.    Marland Kitchen OVER THE COUNTER MEDICATION Take 1 tablet by mouth every morning. Equate brand allergy Rx    . OVER THE COUNTER MEDICATION Take 1 tablet by mouth daily as needed (  acid reducer). OTC Equate brand Acid Reducer    . sertraline (ZOLOFT) 50 MG tablet Take 1 tablet (50 mg total) by mouth daily. 90 tablet 1  . VOLTAREN 1 % GEL      No current facility-administered medications on file prior to visit.     Medical History:  Past Medical History:  Diagnosis Date  . Arthritis    "knees, hands, ankles, ~ every joint" (04/08/2015)  . Basal cell carcinoma    "face, hands, chest" (04/08/2015)  . Chronic bronchitis ([place])    "get it pretty much q yr" (04/08/2015)  . Dysrhythmia    palpitations occ; SVT 09/2013 s/p adenosine  . GERD (gastroesophageal reflux disease)   . H/O hiatal hernia    . Hx of cardiovascular stress test    Lexiscan Myoview 6/16: Ejection fraction is 60% and wall motion is normal. The study is normal. There is no scar or ischemia. This is a low risk scan.  . Hyperlipidemia   . Hypertension   . Hypothyroidism   . Palpitations   . Pneumonia 2015 X 1  . PONV (postoperative nausea and vomiting)   . Thyroid disease   . Vitamin D deficiency     Allergies:  Allergies  Allergen Reactions  . Darvocet [Propoxyphene N-Acetaminophen] Anaphylaxis  . Darvon [Propoxyphene Hcl] Anaphylaxis and Other (See Comments)    Airway swelling   . Propoxyphene Anaphylaxis    Throat closes up  . Oseltamivir Hives    Hives, elevated BP  . Tamiflu [Oseltamivir Phosphate] Other (See Comments)    Increased blood pressure Hives      Review of Systems:  Review of Systems  Constitutional: Negative for chills, fever and malaise/fatigue.  HENT: Negative for congestion, ear pain and sore throat.   Eyes: Negative.   Respiratory: Negative for cough, shortness of breath and wheezing.   Cardiovascular: Negative for chest pain, palpitations and leg swelling.  Gastrointestinal: Negative for abdominal pain, blood in stool, constipation, diarrhea, heartburn and melena.  Genitourinary: Negative.   Skin: Negative.   Neurological: Negative for dizziness, sensory change, loss of consciousness and headaches.  Psychiatric/Behavioral: Negative for depression. The patient is not nervous/anxious and does not have insomnia.     Family history- Review and unchanged  Social history- Review and unchanged  Physical Exam: BP (!) 148/86   Pulse 76   Temp 98.4 F (36.9 C) (Temporal)   Resp 18   Ht 5\' 6"  (1.676 m)   Wt 234 lb (106.1 kg)   BMI 37.77 kg/m  Wt Readings from Last 3 Encounters:  05/16/16 234 lb (106.1 kg)  03/13/16 233 lb 9.6 oz (106 kg)  01/30/16 232 lb (105.2 kg)    General Appearance: Well nourished well developed, in no apparent distress. Eyes: PERRLA, EOMs,  conjunctiva no swelling or erythema ENT/Mouth: Ear canals normal without obstruction, swelling, erythma, discharge.  TMs normal bilaterally.  Oropharynx moist, clear, without exudate, or postoropharyngeal swelling. Neck: Supple, thyroid normal,no cervical adenopathy  Respiratory: Respiratory effort normal, Breath sounds clear A&P without rhonchi, wheeze, or rale.  No retractions, no accessory usage. Cardio: RRR with no MRGs. Brisk peripheral pulses without edema.  Abdomen: Soft, + BS,  Non tender, no guarding, rebound, hernias, masses. Musculoskeletal: Full ROM, 5/5 strength, Normal gait Skin: Warm, dry without rashes, lesions, ecchymosis.  Neuro: Awake and oriented X 3, Cranial nerves intact. Normal muscle tone, no cerebellar symptoms. Psych: Normal affect, Insight and Judgment appropriate.    Starlyn Skeans, PA-C 4:00 PM [facility]

## 2016-05-17 ENCOUNTER — Other Ambulatory Visit: Payer: Self-pay | Admitting: Internal Medicine

## 2016-05-17 LAB — BASIC METABOLIC PANEL WITH GFR
BUN: 16 mg/dL (ref 7–25)
CO2: 24 mmol/L (ref 20–31)
Calcium: 9.9 mg/dL (ref 8.6–10.4)
Chloride: 103 mmol/L (ref 98–110)
Creat: 0.82 mg/dL (ref 0.50–1.05)
GFR, EST NON AFRICAN AMERICAN: 82 mL/min (ref >=60)
GFR, Est African American: >89 mL/min (ref >=60)
GLUCOSE: 91 mg/dL (ref 65–99)
POTASSIUM: 4 mmol/L (ref 3.5–5.3)
Sodium: 140 mmol/L (ref 135–146)

## 2016-05-17 LAB — HEPATIC FUNCTION PANEL
ALT: 18 U/L (ref 6–29)
AST: 17 U/L (ref 10–35)
Albumin: 4.2 g/dL (ref 3.6–5.1)
Alkaline Phosphatase: 81 U/L (ref 33–130)
Bilirubin, Direct: 0 mg/dL (ref <=0.2)
Indirect Bilirubin: 0.3 mg/dL (ref 0.2–1.2)
TOTAL PROTEIN: 6.6 g/dL (ref 6.1–8.1)
Total Bilirubin: 0.3 mg/dL (ref 0.2–1.2)

## 2016-05-17 LAB — LIPID PANEL
CHOLESTEROL: 184 mg/dL (ref 125–200)
HDL: 41 mg/dL — ABNORMAL LOW (ref >=46)
LDL Cholesterol: 103 mg/dL (ref <130)
TRIGLYCERIDES: 198 mg/dL — AB (ref <150)
Total CHOL/HDL Ratio: 4.5 Ratio (ref <=5.0)
VLDL: 40 mg/dL — ABNORMAL HIGH (ref <30)

## 2016-05-17 LAB — HEMOGLOBIN A1C
Hgb A1c MFr Bld: 5.7 % — ABNORMAL HIGH (ref <5.7)
Mean Plasma Glucose: 117 mg/dL

## 2016-05-17 LAB — CBC WITH DIFFERENTIAL/PLATELET
BASOS PCT: 0 %
Basophils Absolute: 0 cells/uL (ref 0–200)
Eosinophils Absolute: 128 cells/uL (ref 15–500)
Eosinophils Relative: 2 %
HEMATOCRIT: 39.1 % (ref 35.0–45.0)
HEMOGLOBIN: 12.9 g/dL (ref 11.7–15.5)
LYMPHS ABS: 2048 {cells}/uL (ref 850–3900)
Lymphocytes Relative: 32 %
MCH: 31.9 pg (ref 27.0–33.0)
MCHC: 33 g/dL (ref 32.0–36.0)
MCV: 96.5 fL (ref 80.0–100.0)
MONO ABS: 704 {cells}/uL (ref 200–950)
MPV: 11.4 fL (ref 7.5–12.5)
Monocytes Relative: 11 %
Neutro Abs: 3520 cells/uL (ref 1500–7800)
Neutrophils Relative %: 55 %
Platelets: 206 10*3/uL (ref 140–400)
RBC: 4.05 MIL/uL (ref 3.80–5.10)
RDW: 13.6 % (ref 11.0–15.0)
WBC: 6.4 10*3/uL (ref 3.8–10.8)

## 2016-05-17 LAB — VITAMIN B12: VITAMIN B 12: 242 pg/mL (ref 200–1100)

## 2016-05-17 LAB — TSH: TSH: 11.39 m[IU]/L — AB

## 2016-05-17 MED ORDER — LEVOTHYROXINE SODIUM 175 MCG PO TABS: 175.0000 ug | ORAL_TABLET | Freq: Every day | ORAL | 1 refills | Status: DC

## 2016-06-14 ENCOUNTER — Ambulatory Visit: Payer: Self-pay | Admitting: Internal Medicine

## 2016-06-19 ENCOUNTER — Ambulatory Visit: Payer: Self-pay | Admitting: Internal Medicine

## 2016-06-29 ENCOUNTER — Other Ambulatory Visit: Payer: Self-pay | Admitting: Internal Medicine

## 2016-08-13 ENCOUNTER — Other Ambulatory Visit: Payer: Self-pay | Admitting: Internal Medicine

## 2016-09-18 ENCOUNTER — Encounter: Payer: Self-pay | Admitting: Internal Medicine

## 2016-09-18 ENCOUNTER — Ambulatory Visit (INDEPENDENT_AMBULATORY_CARE_PROVIDER_SITE_OTHER): Payer: Commercial Managed Care - HMO | Admitting: Internal Medicine

## 2016-09-18 VITALS — BP 148/86 | HR 88 | Temp 98.2°F | Resp 18 | Ht 66.0 in | Wt 244.0 lb

## 2016-09-18 DIAGNOSIS — I10 Essential (primary) hypertension: Secondary | ICD-10-CM

## 2016-09-18 DIAGNOSIS — Z79899 Other long term (current) drug therapy: Secondary | ICD-10-CM | POA: Diagnosis not present

## 2016-09-18 DIAGNOSIS — F17200 Nicotine dependence, unspecified, uncomplicated: Secondary | ICD-10-CM

## 2016-09-18 DIAGNOSIS — E079 Disorder of thyroid, unspecified: Secondary | ICD-10-CM | POA: Diagnosis not present

## 2016-09-18 DIAGNOSIS — E782 Mixed hyperlipidemia: Secondary | ICD-10-CM | POA: Diagnosis not present

## 2016-09-18 DIAGNOSIS — R7303 Prediabetes: Secondary | ICD-10-CM

## 2016-09-18 DIAGNOSIS — E559 Vitamin D deficiency, unspecified: Secondary | ICD-10-CM

## 2016-09-18 LAB — BASIC METABOLIC PANEL WITH GFR
BUN: 12 mg/dL (ref 7–25)
CHLORIDE: 105 mmol/L (ref 98–110)
CO2: 24 mmol/L (ref 20–31)
Calcium: 9.1 mg/dL (ref 8.6–10.4)
Creat: 0.74 mg/dL (ref 0.50–1.05)
GLUCOSE: 90 mg/dL (ref 65–99)
POTASSIUM: 4.1 mmol/L (ref 3.5–5.3)
Sodium: 140 mmol/L (ref 135–146)

## 2016-09-18 LAB — CBC WITH DIFFERENTIAL/PLATELET
BASOS PCT: 0 %
Basophils Absolute: 0 cells/uL (ref 0–200)
EOS ABS: 79 {cells}/uL (ref 15–500)
Eosinophils Relative: 1 %
HCT: 38.4 % (ref 35.0–45.0)
HEMOGLOBIN: 12.7 g/dL (ref 11.7–15.5)
LYMPHS ABS: 1817 {cells}/uL (ref 850–3900)
Lymphocytes Relative: 23 %
MCH: 31.7 pg (ref 27.0–33.0)
MCHC: 33.1 g/dL (ref 32.0–36.0)
MCV: 95.8 fL (ref 80.0–100.0)
MONO ABS: 553 {cells}/uL (ref 200–950)
MPV: 10.8 fL (ref 7.5–12.5)
Monocytes Relative: 7 %
Neutro Abs: 5451 cells/uL (ref 1500–7800)
Neutrophils Relative %: 69 %
Platelets: 235 10*3/uL (ref 140–400)
RBC: 4.01 MIL/uL (ref 3.80–5.10)
RDW: 13 % (ref 11.0–15.0)
WBC: 7.9 10*3/uL (ref 3.8–10.8)

## 2016-09-18 LAB — HEPATIC FUNCTION PANEL
ALK PHOS: 83 U/L (ref 33–130)
ALT: 44 U/L — ABNORMAL HIGH (ref 6–29)
AST: 28 U/L (ref 10–35)
Albumin: 4 g/dL (ref 3.6–5.1)
BILIRUBIN INDIRECT: 0.3 mg/dL (ref 0.2–1.2)
Bilirubin, Direct: 0 mg/dL (ref <=0.2)
TOTAL PROTEIN: 6.6 g/dL (ref 6.1–8.1)
Total Bilirubin: 0.3 mg/dL (ref 0.2–1.2)

## 2016-09-18 LAB — HEMOGLOBIN A1C
HEMOGLOBIN A1C: 5.7 % — AB (ref <5.7)
MEAN PLASMA GLUCOSE: 117 mg/dL

## 2016-09-18 LAB — LIPID PANEL
Cholesterol: 179 mg/dL (ref <200)
HDL: 48 mg/dL — ABNORMAL LOW (ref >50)
LDL CALC: 97 mg/dL (ref <100)
Total CHOL/HDL Ratio: 3.7 Ratio (ref <5.0)
Triglycerides: 168 mg/dL — ABNORMAL HIGH (ref <150)
VLDL: 34 mg/dL — ABNORMAL HIGH (ref <30)

## 2016-09-18 LAB — TSH: TSH: 19.81 mIU/L — ABNORMAL HIGH

## 2016-09-18 NOTE — Progress Notes (Signed)
Assessment and Plan:  Hypertension:  -elevated but given recent surgery and right shoulder pain may be pain induced -monitoring closely at home -Continue medication,  -monitor blood pressure at home.  -Continue DASH diet.   -Reminder to go to the ER if any CP, SOB, nausea, dizziness, severe HA, changes vision/speech, left arm numbness and tingling, and jaw pain.  Cholesterol: -Continue diet and exercise.  -Check cholesterol.   Pre-diabetes: -Continue diet and exercise.  -Check A1C  Vitamin D Def: -continue medications.   Hypothyroidism -cont levothryoxine -TSH  Morbid obesity -discussed medications but explained to patient that no medications are going to change her lifestyle and that she needs to work on this -recommended she see a Physiological scientist.    Right shoulder surgery -followed by ortho  Tobacco abuse -still smoking -trying to quit -has cut back  Continue diet and meds as discussed. Further disposition pending results of labs.  HPI 56 y.o. female  presents for 3 month follow up with hypertension, hyperlipidemia, prediabetes and vitamin D.   Her blood pressure has been controlled at home, today their BP is BP: (!) 148/86.   She does not workout. She denies chest pain, shortness of breath, dizziness.   She is on cholesterol medication and denies myalgias. Her cholesterol is at goal. The cholesterol last visit was:   Lab Results  Component Value Date   CHOL 184 05/16/2016   HDL 41 (L) 05/16/2016   LDLCALC 103 05/16/2016   TRIG 198 (H) 05/16/2016   CHOLHDL 4.5 05/16/2016     She has been working on diet and exercise for prediabetes, and denies foot ulcerations, hyperglycemia, hypoglycemia , increased appetite, nausea, paresthesia of the feet, polydipsia, polyuria, visual disturbances, vomiting and weight loss. Last A1C in the office was:  Lab Results  Component Value Date   HGBA1C 5.7 (H) 05/16/2016    Patient is on Vitamin D supplement.  Lab Results   Component Value Date   VD25OH 23 (L) 03/15/2015     She did have her thyroid medication changed.  She has not changed her way of taking medications.  Generally waits 2 hours.  She recently had right shoulder surgery.  She reports that she just finished her PT prior to coming and her shoulder is really sore today.  She is taking ibuprofen.    She is still smoking.  She does not smoke every day.  She is trying to quit but she has not had great success.  She also reports that she wants to work out more.  She tried the contrave but this didn't help much because it caused severe constipation.    Current Medications:  Current Outpatient Prescriptions on File Prior to Visit  Medication Sig Dispense Refill  . ALPRAZolam (XANAX) 0.5 MG tablet Take 1 tablet (0.5 mg total) by mouth 2 (two) times daily as needed for anxiety or sleep. 60 tablet 1  . fluticasone (FLONASE) 50 MCG/ACT nasal spray Place 1 spray into both nostrils daily.    . hydrochlorothiazide (HYDRODIURIL) 25 MG tablet Take 1 tablet (25 mg total) by mouth daily as needed (fluid). 45 tablet 3  . ibuprofen (ADVIL,MOTRIN) 800 MG tablet Take 800 mg by mouth every 8 (eight) hours as needed for moderate pain.    Marland Kitchen levothyroxine (SYNTHROID, LEVOTHROID) 175 MCG tablet TAKE ONE TABLET BY MOUTH ONCE DAILY BEFORE  BREAKFAST 30 tablet 0  . linaclotide (LINZESS) 72 MCG capsule Take 1 capsule (72 mcg total) by mouth daily before breakfast. 90 capsule  3  . metroNIDAZOLE (METROGEL) 0.75 % gel     . Multiple Vitamins-Minerals (EMERGEN-C FIVE PO) Take 1 Package by mouth daily.    Marland Kitchen OVER THE COUNTER MEDICATION Take 1 tablet by mouth every morning. Equate brand allergy Rx    . OVER THE COUNTER MEDICATION Take 1 tablet by mouth daily as needed (acid reducer). OTC Equate brand Acid Reducer    . sertraline (ZOLOFT) 50 MG tablet TAKE ONE TABLET BY MOUTH  DAILY 90 tablet 1  . VOLTAREN 1 % GEL      No current facility-administered medications on file prior to  visit.     Medical History:  Past Medical History:  Diagnosis Date  . Arthritis    "knees, hands, ankles, ~ every joint" (04/08/2015)  . Basal cell carcinoma    "face, hands, chest" (04/08/2015)  . Chronic bronchitis ([place])    "get it pretty much q yr" (04/08/2015)  . Dysrhythmia    palpitations occ; SVT 09/2013 s/p adenosine  . GERD (gastroesophageal reflux disease)   . H/O hiatal hernia   . Hx of cardiovascular stress test    Lexiscan Myoview 6/16: Ejection fraction is 60% and wall motion is normal. The study is normal. There is no scar or ischemia. This is a low risk scan.  . Hyperlipidemia   . Hypertension   . Hypothyroidism   . Palpitations   . Pneumonia 2015 X 1  . PONV (postoperative nausea and vomiting)   . Thyroid disease   . Vitamin D deficiency     Allergies:  Allergies  Allergen Reactions  . Darvocet [Propoxyphene N-Acetaminophen] Anaphylaxis  . Darvon [Propoxyphene Hcl] Anaphylaxis and Other (See Comments)    Airway swelling   . Propoxyphene Anaphylaxis    Throat closes up  . Oseltamivir Hives    Hives, elevated BP  . Tamiflu [Oseltamivir Phosphate] Other (See Comments)    Increased blood pressure Hives      Review of Systems:  Review of Systems  Constitutional: Negative for chills, fever and malaise/fatigue.  HENT: Negative for congestion, ear pain and sore throat.   Eyes: Negative.   Respiratory: Negative for cough, shortness of breath and wheezing.   Cardiovascular: Negative for chest pain, palpitations and leg swelling.  Gastrointestinal: Negative for abdominal pain, blood in stool, constipation, diarrhea, heartburn and melena.  Genitourinary: Negative.   Skin: Negative.   Neurological: Negative for dizziness, sensory change, loss of consciousness and headaches.  Psychiatric/Behavioral: Negative for depression. The patient is not nervous/anxious and does not have insomnia.     Family history- Review and unchanged  Social history- Review and  unchanged  Physical Exam: BP (!) 148/86   Pulse 88   Temp 98.2 F (36.8 C) (Temporal)   Resp 18   Ht 5\' 6"  (1.676 m)   Wt 244 lb (110.7 kg)   BMI 39.38 kg/m  Wt Readings from Last 3 Encounters:  09/18/16 244 lb (110.7 kg)  05/16/16 234 lb (106.1 kg)  03/13/16 233 lb 9.6 oz (106 kg)    General Appearance: Morbidly obese, appears older than age, Well nourished well developed, in no apparent distress. Eyes: PERRLA, EOMs, conjunctiva no swelling or erythema ENT/Mouth: Ear canals normal without obstruction, swelling, erythma, discharge.  TMs normal bilaterally.  Oropharynx moist, clear, without exudate, or postoropharyngeal swelling. Neck: Supple, thyroid normal,no cervical adenopathy  Respiratory: Respiratory effort normal, Breath sounds clear A&P without rhonchi, wheeze, or rale.  No retractions, no accessory usage. Cardio: RRR with no MRGs. Brisk peripheral  pulses without edema.  Abdomen: Soft, + BS,  Non tender, no guarding, rebound, hernias, masses. Musculoskeletal: Full ROM, 5/5 strength, Normal gait Skin: Warm, dry without rashes, lesions, ecchymosis.  Neuro: Awake and oriented X 3, Cranial nerves intact. Normal muscle tone, no cerebellar symptoms. Psych: Normal affect, Insight and Judgment appropriate.    Starlyn Skeans, PA-C 3:41 PM [facility]

## 2016-09-19 ENCOUNTER — Other Ambulatory Visit: Payer: Self-pay | Admitting: Internal Medicine

## 2016-09-19 DIAGNOSIS — E079 Disorder of thyroid, unspecified: Secondary | ICD-10-CM

## 2016-09-19 MED ORDER — LEVOTHYROXINE SODIUM 300 MCG PO TABS: ORAL_TABLET | ORAL | 1 refills | Status: DC

## 2016-09-22 DIAGNOSIS — M25511 Pain in right shoulder: Secondary | ICD-10-CM | POA: Diagnosis not present

## 2016-09-22 DIAGNOSIS — M25512 Pain in left shoulder: Secondary | ICD-10-CM | POA: Diagnosis not present

## 2016-09-27 DIAGNOSIS — M25512 Pain in left shoulder: Secondary | ICD-10-CM | POA: Diagnosis not present

## 2016-09-28 DIAGNOSIS — M25512 Pain in left shoulder: Secondary | ICD-10-CM | POA: Diagnosis not present

## 2016-10-02 DIAGNOSIS — N951 Menopausal and female climacteric states: Secondary | ICD-10-CM | POA: Diagnosis not present

## 2016-10-02 DIAGNOSIS — M7552 Bursitis of left shoulder: Secondary | ICD-10-CM | POA: Diagnosis not present

## 2016-11-07 ENCOUNTER — Ambulatory Visit (INDEPENDENT_AMBULATORY_CARE_PROVIDER_SITE_OTHER): Payer: Commercial Managed Care - HMO | Admitting: Internal Medicine

## 2016-11-07 ENCOUNTER — Encounter: Payer: Self-pay | Admitting: Internal Medicine

## 2016-11-07 VITALS — BP 134/84 | HR 99 | Ht 65.0 in | Wt 238.0 lb

## 2016-11-07 DIAGNOSIS — E039 Hypothyroidism, unspecified: Secondary | ICD-10-CM | POA: Diagnosis not present

## 2016-11-07 LAB — T4, FREE: FREE T4: 2.7 ng/dL — AB (ref 0.60–1.60)

## 2016-11-07 LAB — TSH: TSH: 0.05 u[IU]/mL — ABNORMAL LOW (ref 0.35–4.50)

## 2016-11-07 MED ORDER — SYNTHROID 200 MCG PO TABS: 200.0000 ug | ORAL_TABLET | Freq: Every day | ORAL | 1 refills | Status: DC

## 2016-11-07 NOTE — Progress Notes (Addendum)
Patient ID: Sherry Dennis, female   DOB: 1961-04-14, 56 y.o.   MRN: [id number]    HPI  Sherry Dennis is a 56 y.o.-year-old female, referred by her PCP, Starlyn Skeans - PA-C ( MCKEOWN,WILLIAM DAVID, MD), for management of hypothyroidism.  Pt. has been dx with hypothyroidism in ~2003 >> on Levothyroxine 300 mcg 5/7 days and 150 2/7 days (last increase 09/2016). She was on DAW Synthroid >> did not make a large difference).  She takes the thyroid hormone: - fasting, at  6 am - with black coffee and her allergy med - separated by 2h from b'fast  - no calcium, iron, multivitamins  - PPIs prn at lunchtime - 1x a month  I reviewed pt's thyroid tests: Lab Results  Component Value Date   TSH 19.81 (H) 09/18/2016   TSH 11.39 (H) 05/16/2016   TSH 2.898 07/05/2015   TSH 0.287 (L) 03/15/2015   TSH 75.392 (H) 12/08/2014   TSH 2.879 06/23/2014   TSH 2.442 12/02/2013   TSH 0.186 (L) 09/23/2013   TSH 5.474 (H) 08/26/2013    09/25/2013: Thyroid U/S: The thyroid gland is fairly small and mildly heterogeneous. Vascularity is subjectively normal.  Pt describes: - + weight gain - + fatigue - + dry skin - no cold intolerance - no depression, but + anxiety - + constipation - on Linzess - no hair loss  Pt denies feeling nodules in neck, hoarseness, dysphagia/odynophagia, SOB with lying down.  She has + FH of thyroid disorders in: mother. No FH of thyroid cancer.  No h/o radiation tx to head or neck. No recent use of iodine supplements. No Biotin.   Pt. also has a history of shoulder and knee sx; carpal tunnel sx; 2 bladder surgeries;  had ablation for SVT.  ROS: Constitutional: + see HPI, + nocturia Eyes: + blurry vision, no xerophthalmia ENT: no sore throat, no nodules palpated in throat, no dysphagia/odynophagia, no hoarseness, + occas. tinnitus Cardiovascular: no CP/palpitations/+ leg swelling Respiratory: no cough/+ SOB Gastrointestinal: no N/V/D/+ C/+ heartburn Musculoskeletal: +  both: muscle/joint aches Skin: no rashes, + easy bruising Neurological: no tremors/numbness/tingling/dizziness Psychiatric: no depression/+ anxiety  Past Medical History:  Diagnosis Date  . Arthritis    "knees, hands, ankles, ~ every joint" (04/08/2015)  . Basal cell carcinoma    "face, hands, chest" (04/08/2015)  . Chronic bronchitis ([place])    "get it pretty much q yr" (04/08/2015)  . Dysrhythmia    palpitations occ; SVT 09/2013 s/p adenosine  . GERD (gastroesophageal reflux disease)   . H/O hiatal hernia   . Hx of cardiovascular stress test    Lexiscan Myoview 6/16: Ejection fraction is 60% and wall motion is normal. The study is normal. There is no scar or ischemia. This is a low risk scan.  . Hyperlipidemia   . Hypertension   . Hypothyroidism   . Palpitations   . Pneumonia 2015 X 1  . PONV (postoperative nausea and vomiting)   . Thyroid disease   . Vitamin D deficiency    Past Surgical History:  Procedure Laterality Date  . BASAL CELL CARCINOMA EXCISION  2014   "off my chest"  . BLADDER REPAIR  1993   "lasered the holes shut"  . CARPAL TUNNEL RELEASE Right 1990  . ELECTROPHYSIOLOGIC STUDY N/A 04/08/2015   Procedure: SVT Ablation;  Surgeon: Evans Lance, MD;  Location: [facility];  Service: Cardiovascular;  Laterality: N/A;  . ENDOMETRIAL ABLATION  2009  . INCONTINENCE SURGERY  2010  . JOINT REPLACEMENT    . KNEE ARTHROSCOPY Right 2013  . PARTIAL KNEE ARTHROPLASTY Right 02/15/2014   Procedure: RIGHT UNICOMPARTMENTAL KNEE (MEDIAL COMPARTMENT);  Surgeon: Vickey Huger, MD;  Location: [facility];  Service: Orthopedics;  Laterality: Right;  . TUBAL LIGATION  1987   Social History   Social History  . Marital status: Divorced    Spouse name: N/A  . Number of children: 2   Occupational History  . Supervisor of housekeeping    Social History Main Topics  . Smoking status: Current Some Day Smoker    Packs/day: 1    Years: 40.00    Types: Cigarettes  . Smokeless  tobacco: Never Used     Comment: 04/08/2015 "went from ~ 2 ppd to 3 cigarettes in 1 week"  . Alcohol use 0.0 oz/week     Comment: 04/08/2015 "couple drinks/year"  . Drug use: No   Current Outpatient Prescriptions on File Prior to Visit  Medication Sig Dispense Refill  . ALPRAZolam (XANAX) 0.5 MG tablet Take 1 tablet (0.5 mg total) by mouth 2 (two) times daily as needed for anxiety or sleep. 60 tablet 1  . fluticasone (FLONASE) 50 MCG/ACT nasal spray Place 1 spray into both nostrils daily.    . hydrochlorothiazide (HYDRODIURIL) 25 MG tablet Take 1 tablet (25 mg total) by mouth daily as needed (fluid). 45 tablet 3  . ibuprofen (ADVIL,MOTRIN) 800 MG tablet Take 800 mg by mouth every 8 (eight) hours as needed for moderate pain.    Marland Kitchen levothyroxine (SYNTHROID, LEVOTHROID) 300 MCG tablet Take 1 tablet daily during the week and 1/2 tablet daily on Saturday and Sunday 30 tablet 1  . linaclotide (LINZESS) 72 MCG capsule Take 1 capsule (72 mcg total) by mouth daily before breakfast. 90 capsule 3  . metroNIDAZOLE (METROGEL) 0.75 % gel     . Multiple Vitamins-Minerals (EMERGEN-C FIVE PO) Take 1 Package by mouth daily.    Marland Kitchen OVER THE COUNTER MEDICATION Take 1 tablet by mouth every morning. Equate brand allergy Rx    . OVER THE COUNTER MEDICATION Take 1 tablet by mouth daily as needed (acid reducer). OTC Equate brand Acid Reducer    . sertraline (ZOLOFT) 50 MG tablet TAKE ONE TABLET BY MOUTH  DAILY 90 tablet 1  . VOLTAREN 1 % GEL      No current facility-administered medications on file prior to visit.    Allergies  Allergen Reactions  . Darvocet [Propoxyphene N-Acetaminophen] Anaphylaxis  . Darvon [Propoxyphene Hcl] Anaphylaxis and Other (See Comments)    Airway swelling   . Propoxyphene Anaphylaxis    Throat closes up  . Oseltamivir Hives    Hives, elevated BP  . Tamiflu [Oseltamivir Phosphate] Other (See Comments)    Increased blood pressure Hives    Family History  Problem Relation Age of  Onset  . Hypertension Mother   . Atrial fibrillation Mother   . Diabetes Father   . Emphysema Father   . COPD Father   . Breast cancer Paternal Aunt     x 4 aunts,   . Stroke Maternal Grandmother   . Hyperlipidemia Maternal Grandfather   . Heart disease Maternal Grandfather   . Hyperlipidemia Paternal Grandmother   . Heart disease Paternal Grandmother   . Hyperlipidemia Paternal Grandfather   . Heart disease Paternal Grandfather   . Heart attack Maternal Grandmother     PE: BP 134/84 (BP Location: Left Arm, Patient Position: Sitting)   Pulse 99   Ht  5\' 5"  (1.651 m)   Wt 238 lb (108 kg)   SpO2 94%   BMI 39.61 kg/m  Wt Readings from Last 3 Encounters:  11/07/16 238 lb (108 kg)  09/18/16 244 lb (110.7 kg)  05/16/16 234 lb (106.1 kg)   Constitutional: obese, in NAD Eyes: PERRLA, EOMI, no exophthalmos ENT: moist mucous membranes, no thyromegaly, no cervical lymphadenopathy Cardiovascular: RRR, No MRG Respiratory: CTA B Gastrointestinal: abdomen soft, NT, ND, BS+ Musculoskeletal: no deformities, strength intact in all 4 Skin: moist, warm, no rashes Neurological: no tremor with outstretched hands, DTR normal, except in R knee (0/)  ASSESSMENT: 1. Uncontrolled Hypothyroidism  PLAN:  1. Patient with long-standing uncontrolled hypothyroidism, on levothyroxine therapy.Her dose was recently increased after the last TSH returned 19. She denies skipping doses. - she appears euthyroid, but c/o dry skin and weight gain  - she does not appear to have a goiter, thyroid nodules, or neck compression symptoms - We discussed about correct intake of levothyroxine, fasting, with water, separated by at least 30 minutes from breakfast, and separated by more than 4 hours from calcium, iron, multivitamins, acid reflux medications (PPIs). She is taking it correctly, but takes her allergy med along with LT4 >> advised to move this later. - will check thyroid tests today: TSH, free T4 and add TPO  Abs to check for Hashimoto's thyroiditis - We discussed that if the labs don't improve, we may switch to brand name Synthroid or liquid levothyroxine (Tirosint). If the labs improved but she still does not feel good with normal thyroid tests, we may change to desiccated thyroid extract. She agrees with this. - If labs today are abnormal, she will need to return in ~-6 weeks for repeat labs - Otherwise, I will see her back in 4 months  Component     Latest Ref Rng & Units 11/07/2016  TSH     0.35 - 4.50 uIU/mL 0.05 (L)  T4,Free(Direct)     0.60 - 1.60 ng/dL 2.70 (H)  TSH is not suppressed, while the free T4 is too high, indicating over replacement with levothyroxine. As of now, the calculated daily dose is ~ 250 mcg. Will decrease the dose to 200 g and recheck the tests in 6 weeks. We will also try to change to Synthroid ran name to see if she feels better on this dose.  Component     Latest Ref Rng & Units 11/07/2016  Thyroperoxidase Ab SerPl-aCnc     <9 IU/mL 17 (H)   New diagnosis of Hashimoto's thyroiditis. No change in plan needed for now.  Philemon Kingdom, MD PhD [facility] Endocr[facility]ology

## 2016-11-07 NOTE — Patient Instructions (Addendum)
Please continue Levothyroxine 300 mcg 5/7 days and 150 2/7 days   Take the thyroid hormone every day, with water, at least 30 minutes before breakfast, separated by at least 4 hours from: - acid reflux medications - calcium - iron - multivitamins  Please stop at the lab.  Please come back in 4 months.

## 2016-11-08 LAB — THYROID PEROXIDASE ANTIBODY: THYROID PEROXIDASE ANTIBODY: 17 [IU]/mL — AB (ref <9)

## 2016-11-24 DIAGNOSIS — M25562 Pain in left knee: Secondary | ICD-10-CM | POA: Diagnosis not present

## 2016-11-27 ENCOUNTER — Ambulatory Visit (INDEPENDENT_AMBULATORY_CARE_PROVIDER_SITE_OTHER): Payer: Commercial Managed Care - HMO | Admitting: Orthopaedic Surgery

## 2016-11-27 ENCOUNTER — Encounter (INDEPENDENT_AMBULATORY_CARE_PROVIDER_SITE_OTHER): Payer: Self-pay | Admitting: Orthopaedic Surgery

## 2016-11-27 ENCOUNTER — Ambulatory Visit (INDEPENDENT_AMBULATORY_CARE_PROVIDER_SITE_OTHER): Payer: Commercial Managed Care - HMO

## 2016-11-27 VITALS — BP 145/98 | HR 95 | Ht 64.0 in | Wt 238.0 lb

## 2016-11-27 DIAGNOSIS — M25512 Pain in left shoulder: Secondary | ICD-10-CM

## 2016-11-27 DIAGNOSIS — M1712 Unilateral primary osteoarthritis, left knee: Secondary | ICD-10-CM | POA: Diagnosis not present

## 2016-11-27 MED ORDER — METHYLPREDNISOLONE ACETATE 40 MG/ML IJ SUSP
80.0000 mg | INTRAMUSCULAR | Status: AC | PRN
  Administered 2016-11-27: 80 mg

## 2016-11-27 MED ORDER — LIDOCAINE HCL 1 % IJ SOLN
5.0000 mL | INTRAMUSCULAR | Status: AC | PRN
  Administered 2016-11-27: 5 mL

## 2016-11-27 MED ORDER — BUPIVACAINE HCL 0.5 % IJ SOLN
3.0000 mL | INTRAMUSCULAR | Status: AC | PRN
  Administered 2016-11-27: 3 mL via INTRA_ARTICULAR

## 2016-11-27 NOTE — Progress Notes (Signed)
Office Visit Note   Patient: Sherry Dennis           Date of Birth: 1960-12-27           MRN: [id number] Visit Date: 11/27/2016              Requested by: Unk Pinto, MD [address] [place], [place] [address] PCP: Alesia Richards, MD   Assessment & Plan: Visit Diagnoses:  1. Acute pain of left shoulder   2. Unilateral primary osteoarthritis, left knee     Plan:  #1: Corticosteroid injection to the left knee #2: Return in 1-2 weeks for injection to the left subacromial space  Follow-Up Instructions: Return in about 2 weeks (around 12/11/2016).   Orders:  Orders Placed This Encounter  Procedures  . Large Joint Injection/Arthrocentesis  . XR Shoulder Left   No orders of the defined types were placed in this encounter.     Procedures: Large Joint Inj Date/Time: 11/27/2016 5:23 PM Performed by: Biagio Borg D Authorized by: Biagio Borg D   Consent Given by:  Patient Timeout: prior to procedure the correct patient, procedure, and site was verified   Indications:  Pain and joint swelling Location:  Knee Site:  L knee Prep: patient was prepped and draped in usual sterile fashion   Needle Size:  25 G Needle Length:  1.5 inches Approach:  Anteromedial Ultrasound Guidance: No   Fluoroscopic Guidance: No   Arthrogram: No   Medications:  5 mL lidocaine 1 %; 80 mg methylPREDNISolone acetate 40 MG/ML; 3 mL bupivacaine 0.5 % Aspiration Attempted: No   Patient tolerance:  Patient tolerated the procedure well with no immediate complications     Clinical Data: No additional findings.   Subjective: Chief Complaint  Patient presents with  . Left Knee - Pain, Edema    Ms. Staheli is a 56 y o female that presents with L knee pain x 3 weeks. No injury, shooting pain, swelling and redness. Denies fever and chills. Bought a knee brace and keeps ice pack on every night. Pt would like a cortisone injection, last one approx. 6   . Left Shoulder -  Pain    L shoulder pain x 3 weeks. Pt thinks she is overworking L one to compensate for R shoulder injury (unrelated). Pain radiates to bicep area.    Ms. Marsee is a 56 y o female that presents with left knee pain x 3 weeks. No injury, shooting pain, swelling and redness. Denies fever and chills. Bought a knee brace and keeps ice pack on every night. She states that she has she went to an urgent care and they told her that she tore something that this is not of an acute injury but more chronic problem. Pt states that she would like a cortisone injection.  Left shoulder pain x 3 weeks. Pt thinks she is overworking left one to compensate for right shoulder injury which she is being treated for elsewhere. Pain radiates to bicep area.     Review of Systems  Constitutional: Negative.   HENT: Negative.   Respiratory: Negative.   Cardiovascular: Negative.   Gastrointestinal: Negative.   Genitourinary: Negative.   Skin: Negative.   Neurological: Negative.   Hematological: Negative.   Psychiatric/Behavioral: Negative.      Objective: Vital Signs: BP (!) 145/98   Pulse 95   Ht 5\' 4"  (1.626 m)   Wt 238 lb (108 kg)   BMI 40.85 kg/m   Physical Exam  Constitutional: She is oriented to person, place, and time. She appears well-developed and well-nourished.  HENT:  Head: Normocephalic and atraumatic.  Eyes: EOM are normal. Pupils are equal, round, and reactive to light.  Pulmonary/Chest: Effort normal.  Neurological: She is alert and oriented to person, place, and time.  Skin: Skin is warm and dry.  Psychiatric: She has a normal mood and affect. Her behavior is normal. Judgment and thought content normal.    Left Shoulder Exam   Range of Motion  Active Abduction: 150  Passive Abduction: 160  Forward Flexion: 170  External Rotation: 80  Internal Rotation 90 degrees: 30   Muscle Strength  Abduction: 3/5  Internal Rotation: 3/5  External Rotation: 3/5  Supraspinatus: 3/5    Tests  Apprehension: positive Drop Arm: negative  Other  Sensation: normal       Specialty Comments:  No specialty comments available.  Imaging: Xr Shoulder Left  Result Date: 11/27/2016 Two-view x-ray of the left shoulder reveals a type I acromion area and some mild degenerative changes at the distal clavicle noted.    PMFS History: Patient Active Problem List   Diagnosis Date Noted  . SVT (supraventricular tachycardia) ([facility]) 04/08/2015  . PSVT (paroxysmal supraventricular tachycardia) ([facility]) 01/03/2015  . Tobacco use disorder 12/08/2014  . Arthritis of knee, degenerative 08/31/2014  . Palpitations 06/23/2014  . History of knee surgery 05/12/2014  . S/P total knee arthroplasty 02/15/2014  . Arthritis, degenerative 11/24/2013  . Obesity 09/11/2013  . Hypertension   . Hyperlipidemia   . Thyroid disease   . Vitamin D deficiency    Past Medical History:  Diagnosis Date  . Arthritis    "knees, hands, ankles, ~ every joint" (04/08/2015)  . Basal cell carcinoma    "face, hands, chest" (04/08/2015)  . Chronic bronchitis ([facility])    "get it pretty much q yr" (04/08/2015)  . Dysrhythmia    palpitations occ; SVT 09/2013 s/p adenosine  . GERD (gastroesophageal reflux disease)   . H/O hiatal hernia   . Hx of cardiovascular stress test    Lexiscan Myoview 6/16: Ejection fraction is 60% and wall motion is normal. The study is normal. There is no scar or ischemia. This is a low risk scan.  . Hyperlipidemia   . Hypertension   . Hypothyroidism   . Palpitations   . Pneumonia 2015 X 1  . PONV (postoperative nausea and vomiting)   . Thyroid disease   . Vitamin D deficiency     Family History  Problem Relation Age of Onset  . Hypertension Mother   . Atrial fibrillation Mother   . Diabetes Father   . Emphysema Father   . COPD Father   . Breast cancer Paternal Aunt     x 4 aunts,   . Stroke Maternal Grandmother   . Hyperlipidemia Maternal Grandfather   . Heart disease  Maternal Grandfather   . Hyperlipidemia Paternal Grandmother   . Heart disease Paternal Grandmother   . Hyperlipidemia Paternal Grandfather   . Heart disease Paternal Grandfather   . Heart attack Maternal Grandmother     Past Surgical History:  Procedure Laterality Date  . BASAL CELL CARCINOMA EXCISION  2014   "off my chest"  . BLADDER REPAIR  1993   "lasered the holes shut"  . CARPAL TUNNEL RELEASE Right 1990  . ELECTROPHYSIOLOGIC STUDY N/A 04/08/2015   Procedure: SVT Ablation;  Surgeon: Evans Lance, MD;  Location: [facility];  Service: Cardiovascular;  Laterality: N/A;  .  ENDOMETRIAL ABLATION  2009  . INCONTINENCE SURGERY  2010  . JOINT REPLACEMENT    . KNEE ARTHROSCOPY Right 2013  . PARTIAL KNEE ARTHROPLASTY Right 02/15/2014   Procedure: RIGHT UNICOMPARTMENTAL KNEE (MEDIAL COMPARTMENT);  Surgeon: Vickey Huger, MD;  Location: [facility];  Service: Orthopedics;  Laterality: Right;  . TUBAL LIGATION  1987   Social History   Occupational History  . Not on file.   Social History Main Topics  . Smoking status: Current Some Day Smoker    Packs/day: 1.50    Years: 40.00    Types: Cigarettes  . Smokeless tobacco: Never Used     Comment: 04/08/2015 "went from ~ 2 ppd to 3 cigarettes in 1 week"  . Alcohol use 0.0 oz/week     Comment: 04/08/2015 "couple drinks/year"  . Drug use: No  . Sexual activity: Not Currently

## 2016-12-05 ENCOUNTER — Ambulatory Visit (INDEPENDENT_AMBULATORY_CARE_PROVIDER_SITE_OTHER): Payer: Commercial Managed Care - HMO | Admitting: Orthopedic Surgery

## 2016-12-06 ENCOUNTER — Ambulatory Visit (INDEPENDENT_AMBULATORY_CARE_PROVIDER_SITE_OTHER): Payer: Commercial Managed Care - HMO | Admitting: Orthopaedic Surgery

## 2016-12-06 ENCOUNTER — Encounter (INDEPENDENT_AMBULATORY_CARE_PROVIDER_SITE_OTHER): Payer: Self-pay | Admitting: Orthopaedic Surgery

## 2016-12-06 DIAGNOSIS — M7542 Impingement syndrome of left shoulder: Secondary | ICD-10-CM

## 2016-12-06 MED ORDER — METHYLPREDNISOLONE ACETATE 40 MG/ML IJ SUSP
80.0000 mg | INTRAMUSCULAR | Status: AC | PRN
  Administered 2016-12-06: 80 mg

## 2016-12-06 MED ORDER — BUPIVACAINE HCL 0.5 % IJ SOLN
2.0000 mL | INTRAMUSCULAR | Status: AC | PRN
  Administered 2016-12-06: 2 mL via INTRA_ARTICULAR

## 2016-12-06 MED ORDER — LIDOCAINE HCL 1 % IJ SOLN
2.0000 mL | INTRAMUSCULAR | Status: AC | PRN
  Administered 2016-12-06: 2 mL

## 2016-12-06 NOTE — Progress Notes (Signed)
Office Visit Note   Patient: Sherry Dennis           Date of Birth: 1961/05/17           MRN: [id number] Visit Date: 12/06/2016              Requested by: Unk Pinto, MD [address] [place], [place] [address] PCP: Alesia Richards, MD   Assessment & Plan: Visit Diagnoses:  1. Impingement syndrome of left shoulder     Plan:  #1: Corticosteroid injection subacromial space was in place. And she had good relief.  Follow-Up Instructions: Return if symptoms worsen or fail to improve.   Orders:  No orders of the defined types were placed in this encounter.  No orders of the defined types were placed in this encounter.     Procedures: Large Joint Inj Date/Time: 12/06/2016 2:48 PM Performed by: Biagio Borg D Authorized by: Biagio Borg D   Consent Given by:  Patient Timeout: prior to procedure the correct patient, procedure, and site was verified   Indications:  Pain Location:  Shoulder Site:  L subacromial bursa Prep: patient was prepped and draped in usual sterile fashion   Needle Size:  25 G Needle Length:  1.5 inches Approach:  Lateral Ultrasound Guidance: No   Fluoroscopic Guidance: No   Arthrogram: No   Medications:  80 mg methylPREDNISolone acetate 40 MG/ML; 2 mL lidocaine 1 %; 2 mL bupivacaine 0.5 % Aspiration Attempted: No   Patient tolerance:  Patient tolerated the procedure well with no immediate complications     Clinical Data: No additional findings.   Subjective: Chief Complaint  Patient presents with  . Left Shoulder - Injections, Pain    Sherry Dennis is a 56 y o that presents with Left shoulder pain. Her last visit BP said he would give her a cortisone shot for the injection.    HPI  Left shoulder pain x 3 weeks. Pt thinks she is overworking left one to compensate for right shoulder injury which she is being treated for elsewhere. Pain radiates to bicep area.  Review of Systems  Constitutional: Negative.   HENT:  Negative.   Respiratory: Negative.   Cardiovascular: Negative.   Gastrointestinal: Negative.   Genitourinary: Negative.   Skin: Negative.   Neurological: Negative.   Hematological: Negative.   Psychiatric/Behavioral: Negative.      Objective: Vital Signs: There were no vitals taken for this visit.  Physical Exam  Constitutional: She is oriented to person, place, and time. She appears well-developed and well-nourished.  HENT:  Head: Normocephalic and atraumatic.  Eyes: EOM are normal. Pupils are equal, round, and reactive to light.  Pulmonary/Chest: Effort normal.  Neurological: She is alert and oriented to person, place, and time.  Skin: Skin is warm and dry.  Psychiatric: She has a normal mood and affect. Her behavior is normal. Judgment and thought content normal.    Ortho Exam  Left Shoulder Exam   Range of Motion  Active Abduction: 150  Passive Abduction: 160  Forward Flexion: 170  External Rotation: 80  Internal Rotation 90 degrees: 30   Muscle Strength  Abduction: 3/5  Internal Rotation: 3/5  External Rotation: 3/5  Supraspinatus: 3/5   Tests  Apprehension: positive Drop Arm: negative  Other  Sensation: normal   Specialty Comments:  No specialty comments available.  Imaging: No results found.   PMFS History: Patient Active Problem List   Diagnosis Date Noted  . SVT (supraventricular tachycardia) ([facility])  04/08/2015  . PSVT (paroxysmal supraventricular tachycardia) ([facility]) 01/03/2015  . Tobacco use disorder 12/08/2014  . Arthritis of knee, degenerative 08/31/2014  . Palpitations 06/23/2014  . History of knee surgery 05/12/2014  . S/P total knee arthroplasty 02/15/2014  . Arthritis, degenerative 11/24/2013  . Obesity 09/11/2013  . Hypertension   . Hyperlipidemia   . Thyroid disease   . Vitamin D deficiency    Past Medical History:  Diagnosis Date  . Arthritis    "knees, hands, ankles, ~ every joint" (04/08/2015)  . Basal cell carcinoma      "face, hands, chest" (04/08/2015)  . Chronic bronchitis ([facility])    "get it pretty much q yr" (04/08/2015)  . Dysrhythmia    palpitations occ; SVT 09/2013 s/p adenosine  . GERD (gastroesophageal reflux disease)   . H/O hiatal hernia   . Hx of cardiovascular stress test    Lexiscan Myoview 6/16: Ejection fraction is 60% and wall motion is normal. The study is normal. There is no scar or ischemia. This is a low risk scan.  . Hyperlipidemia   . Hypertension   . Hypothyroidism   . Palpitations   . Pneumonia 2015 X 1  . PONV (postoperative nausea and vomiting)   . Thyroid disease   . Vitamin D deficiency     Family History  Problem Relation Age of Onset  . Hypertension Mother   . Atrial fibrillation Mother   . Diabetes Father   . Emphysema Father   . COPD Father   . Breast cancer Paternal Aunt     x 4 aunts,   . Stroke Maternal Grandmother   . Hyperlipidemia Maternal Grandfather   . Heart disease Maternal Grandfather   . Hyperlipidemia Paternal Grandmother   . Heart disease Paternal Grandmother   . Hyperlipidemia Paternal Grandfather   . Heart disease Paternal Grandfather   . Heart attack Maternal Grandmother     Past Surgical History:  Procedure Laterality Date  . BASAL CELL CARCINOMA EXCISION  2014   "off my chest"  . BLADDER REPAIR  1993   "lasered the holes shut"  . CARPAL TUNNEL RELEASE Right 1990  . ELECTROPHYSIOLOGIC STUDY N/A 04/08/2015   Procedure: SVT Ablation;  Surgeon: Evans Lance, MD;  Location: [facility];  Service: Cardiovascular;  Laterality: N/A;  . ENDOMETRIAL ABLATION  2009  . INCONTINENCE SURGERY  2010  . JOINT REPLACEMENT    . KNEE ARTHROSCOPY Right 2013  . PARTIAL KNEE ARTHROPLASTY Right 02/15/2014   Procedure: RIGHT UNICOMPARTMENTAL KNEE (MEDIAL COMPARTMENT);  Surgeon: Vickey Huger, MD;  Location: [facility];  Service: Orthopedics;  Laterality: Right;  . TUBAL LIGATION  1987   Social History   Occupational History  . Not on file.   Social  History Main Topics  . Smoking status: Current Some Day Smoker    Packs/day: 1.50    Years: 40.00    Types: Cigarettes  . Smokeless tobacco: Never Used     Comment: 04/08/2015 "went from ~ 2 ppd to 3 cigarettes in 1 week"  . Alcohol use 0.0 oz/week     Comment: 04/08/2015 "couple drinks/year"  . Drug use: No  . Sexual activity: Not Currently

## 2016-12-11 ENCOUNTER — Encounter: Payer: Self-pay | Admitting: Internal Medicine

## 2016-12-15 ENCOUNTER — Other Ambulatory Visit (INDEPENDENT_AMBULATORY_CARE_PROVIDER_SITE_OTHER): Payer: Self-pay | Admitting: Orthopaedic Surgery

## 2016-12-17 ENCOUNTER — Ambulatory Visit: Payer: Self-pay | Admitting: Internal Medicine

## 2016-12-28 ENCOUNTER — Other Ambulatory Visit (INDEPENDENT_AMBULATORY_CARE_PROVIDER_SITE_OTHER): Payer: Self-pay

## 2016-12-28 MED ORDER — VOLTAREN 1 % TD GEL: 2.0000 g | Freq: Four times a day (QID) | TRANSDERMAL | 2 refills | Status: DC

## 2017-01-03 ENCOUNTER — Ambulatory Visit: Payer: Self-pay | Admitting: Physician Assistant

## 2017-01-18 ENCOUNTER — Other Ambulatory Visit: Payer: Self-pay

## 2017-01-18 MED ORDER — SYNTHROID 200 MCG PO TABS: 200.0000 ug | ORAL_TABLET | Freq: Every day | ORAL | 1 refills | Status: DC

## 2017-01-27 NOTE — Progress Notes (Signed)
Assessment and Plan:  Essential hypertension - continue medications, DASH diet, exercise and monitor at home. Call if greater than 130/80.  -     CBC with Differential/Platelet -     BASIC METABOLIC PANEL WITH GFR -     Hepatic function panel  PSVT (paroxysmal supraventricular tachycardia) (HCC) Controlled, monitor, get sleep study  Thyroid disease Continue to follow up Dr. Darnell Level  Mixed hyperlipidemia -continue medications, check lipids, decrease fatty foods, increase activity.  -     Lipid panel  Tobacco use disorder Smoking cessation-  instruction/counseling given, counseled patient on the dangers of tobacco use, advised patient to stop smoking, and reviewed strategies to maximize success, patient not ready to quit at this time.   Vitamin D deficiency  Class 3 severe obesity due to excess calories with serious comorbidity and body mass index (BMI) of 40.0 to 44.9 in adult [facility]) - long discussion about weight loss, diet, and exercise -     Ambulatory referral to Sleep Studies  Sleep apnea, unspecified type + snoring, + obesity, + apnea/arousals, day time fatigue -     Ambulatory referral to Sleep Studies  Prediabetes -     Hemoglobin A1c  Medication management -     Magnesium  Continue diet and meds as discussed. Further disposition pending results of labs.  HPI 56 y.o. female  presents for 3 month follow up with hypertension, hyperlipidemia, prediabetes and vitamin D. Her blood pressure has been controlled at home, today their BP is BP: 138/90 She does not workout.  She denies chest pain, shortness of breath, dizziness.  She is not on cholesterol medication and denies myalgias. Her cholesterol is at goal. The cholesterol last visit was:   Lab Results  Component Value Date   CHOL 179 09/18/2016   HDL 48 (L) 09/18/2016   LDLCALC 97 09/18/2016   TRIG 168 (H) 09/18/2016   CHOLHDL 3.7 09/18/2016   She has been working on diet and exercise for prediabetes, and denies  paresthesia of the feet, polydipsia and polyuria. Last A1C in the office was:  Lab Results  Component Value Date   HGBA1C 5.7 (H) 09/18/2016   Patient is on Vitamin D supplement.   Lab Results  Component Value Date   VD25OH 23 (L) 03/15/2015     She is on thyroid medication. Her medication was changed last visit, synthroid was decreased to 264mcg, following with Dr. Darnell Level. Patient denies fatigue, weight changes, heat/cold intolerance, bowel/skin changes or CVS symptoms  Lab Results  Component Value Date   TSH 0.05 (L) 11/07/2016  .  BMI is Body mass index is 40.13 kg/m. She wakes up frequently, does not know why, fatigue with waking up. + snoring Wt Readings from Last 3 Encounters:  01/28/17 233 lb 12.8 oz (106.1 kg)  11/27/16 238 lb (108 kg)  11/07/16 238 lb (108 kg)    Current Medications:  Current Outpatient Prescriptions on File Prior to Visit  Medication Sig Dispense Refill  . ALPRAZolam (XANAX) 0.5 MG tablet Take 1 tablet (0.5 mg total) by mouth 2 (two) times daily as needed for anxiety or sleep. 60 tablet 1  . fluticasone (FLONASE) 50 MCG/ACT nasal spray Place 1 spray into both nostrils daily.    . hydrochlorothiazide (HYDRODIURIL) 25 MG tablet Take 1 tablet (25 mg total) by mouth daily as needed (fluid). 45 tablet 3  . ibuprofen (ADVIL,MOTRIN) 800 MG tablet Take 800 mg by mouth every 8 (eight) hours as needed for moderate pain.    Marland Kitchen  linaclotide (LINZESS) 72 MCG capsule Take 1 capsule (72 mcg total) by mouth daily before breakfast. 90 capsule 3  . metroNIDAZOLE (METROGEL) 0.75 % gel     . Multiple Vitamins-Minerals (EMERGEN-C FIVE PO) Take 1 Package by mouth daily.    Marland Kitchen OVER THE COUNTER MEDICATION Take 1 tablet by mouth every morning. Equate brand allergy Rx    . OVER THE COUNTER MEDICATION Take 1 tablet by mouth daily as needed (acid reducer). OTC Equate brand Acid Reducer    . sertraline (ZOLOFT) 50 MG tablet TAKE ONE TABLET BY MOUTH  DAILY 90 tablet 1  . SYNTHROID 200 MCG  tablet Take 1 tablet (200 mcg total) by mouth daily before breakfast. 45 tablet 1  . VOLTAREN 1 % GEL Apply 2 g topically 4 (four) times daily. 4 Tube 2   No current facility-administered medications on file prior to visit.    Medical History:  Past Medical History:  Diagnosis Date  . Arthritis    "knees, hands, ankles, ~ every joint" (04/08/2015)  . Basal cell carcinoma    "face, hands, chest" (04/08/2015)  . Chronic bronchitis ([facility])    "get it pretty much q yr" (04/08/2015)  . Dysrhythmia    palpitations occ; SVT 09/2013 s/p adenosine  . GERD (gastroesophageal reflux disease)   . H/O hiatal hernia   . Hx of cardiovascular stress test    Lexiscan Myoview 6/16: Ejection fraction is 60% and wall motion is normal. The study is normal. There is no scar or ischemia. This is a low risk scan.  . Hyperlipidemia   . Hypertension   . Hypothyroidism   . Palpitations   . Pneumonia 2015 X 1  . PONV (postoperative nausea and vomiting)   . Thyroid disease   . Vitamin D deficiency    Allergies:  Allergies  Allergen Reactions  . Darvocet [Propoxyphene N-Acetaminophen] Anaphylaxis  . Darvon [Propoxyphene Hcl] Anaphylaxis and Other (See Comments)    Airway swelling   . Propoxyphene Anaphylaxis    Throat closes up  . Oseltamivir Hives    Hives, elevated BP  . Tamiflu [Oseltamivir Phosphate] Other (See Comments)    Increased blood pressure Hives     Review of Systems  Constitutional: Positive for malaise/fatigue. Negative for chills, diaphoresis, fever and weight loss.  HENT: Negative.   Eyes: Negative.   Respiratory: Negative.   Cardiovascular: Negative.   Gastrointestinal: Negative.   Genitourinary: Negative.   Musculoskeletal: Positive for neck pain. Negative for back pain, falls, joint pain and myalgias.  Skin: Negative.   Neurological: Positive for tingling and sensory change (bilateral arms from neck). Negative for dizziness, tremors, speech change, focal weakness, seizures, loss  of consciousness, weakness and headaches.  Psychiatric/Behavioral: Positive for depression. Negative for hallucinations, memory loss, substance abuse and suicidal ideas. The patient has insomnia. The patient is not nervous/anxious.      Family history- Review and unchanged Social history- Review and unchanged Physical Exam: BP 138/90   Pulse 89   Temp 97.4 F (36.3 C)   Resp 16   Ht 5\' 4"  (1.626 m)   Wt 233 lb 12.8 oz (106.1 kg)   SpO2 98%   BMI 40.13 kg/m  Wt Readings from Last 3 Encounters:  01/28/17 233 lb 12.8 oz (106.1 kg)  11/27/16 238 lb (108 kg)  11/07/16 238 lb (108 kg)   General Appearance: Well nourished, in no apparent distress. Eyes: PERRLA, EOMs, conjunctiva no swelling or erythema Sinuses: No Frontal/maxillary tenderness ENT/Mouth: Ext aud canals  clear, TMs without erythema, bulging. No erythema, swelling, or exudate on post pharynx.  Tonsils not swollen or erythematous. Hearing normal.  Neck: Supple, thyroid normal.  Respiratory: Respiratory effort normal, BS equal bilaterally without rales, rhonchi, wheezing or stridor.  Cardio: RRR with no MRGs. Brisk peripheral pulses without edema.  Abdomen: Soft, + BS, obese Non tender, no guarding, rebound, hernias, masses. Lymphatics: Non tender without lymphadenopathy.  Musculoskeletal: Full ROM, 5/5 strength, antalgic gait Skin: Warm, dry without rashes, lesions, ecchymosis.  Neuro: Cranial nerves intact. Normal muscle tone, no cerebellar symptoms. Sensation intact.  Psych: Awake and oriented X 3, normal affect, Insight and Judgment appropriate.    Vicie Mutters, PA-C 2:35 PM [facility]

## 2017-01-28 ENCOUNTER — Encounter: Payer: Self-pay | Admitting: Physician Assistant

## 2017-01-28 ENCOUNTER — Ambulatory Visit (INDEPENDENT_AMBULATORY_CARE_PROVIDER_SITE_OTHER): Payer: Commercial Managed Care - HMO | Admitting: Physician Assistant

## 2017-01-28 VITALS — BP 138/90 | HR 89 | Temp 97.4°F | Resp 16 | Ht 64.0 in | Wt 233.8 lb

## 2017-01-28 DIAGNOSIS — Z79899 Other long term (current) drug therapy: Secondary | ICD-10-CM

## 2017-01-28 DIAGNOSIS — E782 Mixed hyperlipidemia: Secondary | ICD-10-CM | POA: Diagnosis not present

## 2017-01-28 DIAGNOSIS — G4730 Sleep apnea, unspecified: Secondary | ICD-10-CM

## 2017-01-28 DIAGNOSIS — E079 Disorder of thyroid, unspecified: Secondary | ICD-10-CM | POA: Diagnosis not present

## 2017-01-28 DIAGNOSIS — Z6841 Body Mass Index (BMI) 40.0 and over, adult: Secondary | ICD-10-CM

## 2017-01-28 DIAGNOSIS — E559 Vitamin D deficiency, unspecified: Secondary | ICD-10-CM | POA: Diagnosis not present

## 2017-01-28 DIAGNOSIS — F17200 Nicotine dependence, unspecified, uncomplicated: Secondary | ICD-10-CM

## 2017-01-28 DIAGNOSIS — I10 Essential (primary) hypertension: Secondary | ICD-10-CM

## 2017-01-28 DIAGNOSIS — I471 Supraventricular tachycardia: Secondary | ICD-10-CM

## 2017-01-28 DIAGNOSIS — R7303 Prediabetes: Secondary | ICD-10-CM

## 2017-01-28 LAB — CBC WITH DIFFERENTIAL/PLATELET
BASOS ABS: 0 {cells}/uL (ref 0–200)
BASOS PCT: 0 %
EOS ABS: 84 {cells}/uL (ref 15–500)
Eosinophils Relative: 1 %
HEMATOCRIT: 39.5 % (ref 35.0–45.0)
Hemoglobin: 13.1 g/dL (ref 11.7–15.5)
LYMPHS PCT: 25 %
Lymphs Abs: 2100 cells/uL (ref 850–3900)
MCH: 30.2 pg (ref 27.0–33.0)
MCHC: 33.2 g/dL (ref 32.0–36.0)
MCV: 91 fL (ref 80.0–100.0)
MONOS PCT: 7 %
MPV: 10.4 fL (ref 7.5–12.5)
Monocytes Absolute: 588 cells/uL (ref 200–950)
NEUTROS ABS: 5628 {cells}/uL (ref 1500–7800)
Neutrophils Relative %: 67 %
Platelets: 296 10*3/uL (ref 140–400)
RBC: 4.34 MIL/uL (ref 3.80–5.10)
RDW: 13.4 % (ref 11.0–15.0)
WBC: 8.4 10*3/uL (ref 3.8–10.8)

## 2017-01-28 LAB — HEPATIC FUNCTION PANEL
ALBUMIN: 3.9 g/dL (ref 3.6–5.1)
ALT: 15 U/L (ref 6–29)
AST: 14 U/L (ref 10–35)
Alkaline Phosphatase: 106 U/L (ref 33–130)
BILIRUBIN TOTAL: 0.2 mg/dL (ref 0.2–1.2)
Bilirubin, Direct: 0 mg/dL (ref <=0.2)
Indirect Bilirubin: 0.2 mg/dL (ref 0.2–1.2)
Total Protein: 7 g/dL (ref 6.1–8.1)

## 2017-01-28 LAB — BASIC METABOLIC PANEL WITH GFR
BUN: 14 mg/dL (ref 7–25)
CHLORIDE: 102 mmol/L (ref 98–110)
CO2: 27 mmol/L (ref 20–31)
CREATININE: 0.82 mg/dL (ref 0.50–1.05)
Calcium: 9.6 mg/dL (ref 8.6–10.4)
GFR, Est African American: >89 mL/min (ref >=60)
GFR, Est Non African American: 81 mL/min (ref >=60)
GLUCOSE: 105 mg/dL — AB (ref 65–99)
POTASSIUM: 4.3 mmol/L (ref 3.5–5.3)
Sodium: 138 mmol/L (ref 135–146)

## 2017-01-28 LAB — MAGNESIUM: MAGNESIUM: 2.1 mg/dL (ref 1.5–2.5)

## 2017-01-28 LAB — LIPID PANEL
Cholesterol: 167 mg/dL (ref <200)
HDL: 38 mg/dL — ABNORMAL LOW (ref >50)
LDL Cholesterol: 74 mg/dL (ref <100)
Total CHOL/HDL Ratio: 4.4 Ratio (ref <5.0)
Triglycerides: 275 mg/dL — ABNORMAL HIGH (ref <150)
VLDL: 55 mg/dL — ABNORMAL HIGH (ref <30)

## 2017-01-28 NOTE — Patient Instructions (Addendum)
Try to do 80-100 oz a day, measure it out Eat 3 meals a day, can be something small, yogurt, protein shake, boiled eggs   Simple math prevails.    1st - exercise does not produce significant weight loss - at best one converts fat into muscle , "bulks up", loses inches, but usually stays "weight neutral"     2nd - think of your body weightas a check book: If you eat more calories than you burn up - you save money or gain weight .... Or if you spend more money than you put in the check book, ie burn up more calories than you eat, then you lose weight     3rd - if you walk or run 1 mile, you burn up 100 calories - you have to burn up 3,500 calories to lose 1 pound, ie you have to walk/run 35 miles to lose 1 measly pound. So if you want to lose 10 #, then you have to walk/run 350 miles, so.... clearly exercise is not the solution.     4. So if you consume 1,500 calories, then you have to burn up the equivalent of 15 miles to stay weight neutral - It also stands to reason that if you consume 1,500 cal/day and don't lose weight, then you must be burning up about 1,500 cals/day to stay weight neutral.     5. If you really want to lose weight, you must cut your calorie intake 300 calories /day and at that rate you should lose about 1 # every 3 days.   6. Please purchase Dr Fara Olden Fuhrman's book(s) "The End of Dieting" & "Eat to Live" . It has some great concepts and recipes.      We want weight loss that will last so you should lose 1-2 pounds a week.  THAT IS IT! Please pick THREE things a month to change. Once it is a habit check off the item. Then pick another three items off the list to become habits.  If you are already doing a habit on the list GREAT!  Cross that item off! o Don't drink your calories. Ie, alcohol, soda, fruit juice, and sweet tea.  o Drink more water. Drink a glass when you feel hungry or before each meal.  o Eat breakfast - Complex carb and protein (likeDannon light and fit  yogurt, oatmeal, fruit, eggs, [place] bacon). o Measure your cereal.  Eat no more than one cup a day. (ie [place]) o Eat an apple a day. o Add a vegetable a day. o Try a new vegetable a month. o Use Pam! Stop using oil or butter to [place]. o Don't finish your plate or use smaller plates. o Share your dessert. o Eat sugar free Jello for dessert or frozen grapes. o Don't eat 2-3 hours before bed. o Switch to whole wheat bread, pasta, and brown rice. o Make healthier choices when you eat out. No fries! o Pick baked chicken, NOT fried. o Don't forget to SLOW DOWN when you eat. It is not going anywhere.  o Take the stairs. o Park far away in the parking lot o [employer] (or weights) for 10 minutes while watching TV. o Walk at work for 10 minutes during break. o Walk outside 1 time a week with your friend, kids, dog, or significant other. o Start a walking group at [place] the mall as much as you can tolerate.  o Keep a food diary. o Weigh yourself  daily. o Walk for 15 minutes 3 days per week. o [place] at home more often and eat out less.  If life happens and you go back to old habits, it is okay.  Just start over. You can do it!   If you experience chest pain, get short of breath, or tired during the exercise, please stop immediately and inform your doctor.

## 2017-01-29 LAB — HEMOGLOBIN A1C
Hgb A1c MFr Bld: 5.8 % — ABNORMAL HIGH (ref <5.7)
MEAN PLASMA GLUCOSE: 120 mg/dL

## 2017-01-30 DIAGNOSIS — M79641 Pain in right hand: Secondary | ICD-10-CM | POA: Diagnosis not present

## 2017-01-30 DIAGNOSIS — L03119 Cellulitis of unspecified part of limb: Secondary | ICD-10-CM | POA: Diagnosis not present

## 2017-01-30 DIAGNOSIS — Z79899 Other long term (current) drug therapy: Secondary | ICD-10-CM | POA: Diagnosis not present

## 2017-01-30 DIAGNOSIS — M129 Arthropathy, unspecified: Secondary | ICD-10-CM | POA: Diagnosis not present

## 2017-02-06 DIAGNOSIS — M790 Rheumatism, unspecified: Secondary | ICD-10-CM | POA: Diagnosis not present

## 2017-02-06 DIAGNOSIS — N183 Chronic kidney disease, stage 3 (moderate): Secondary | ICD-10-CM | POA: Diagnosis not present

## 2017-02-06 DIAGNOSIS — L03119 Cellulitis of unspecified part of limb: Secondary | ICD-10-CM | POA: Diagnosis not present

## 2017-02-16 DIAGNOSIS — N289 Disorder of kidney and ureter, unspecified: Secondary | ICD-10-CM | POA: Diagnosis not present

## 2017-02-16 DIAGNOSIS — M790 Rheumatism, unspecified: Secondary | ICD-10-CM | POA: Diagnosis not present

## 2017-02-16 DIAGNOSIS — M25541 Pain in joints of right hand: Secondary | ICD-10-CM | POA: Diagnosis not present

## 2017-03-06 DIAGNOSIS — Z1231 Encounter for screening mammogram for malignant neoplasm of breast: Secondary | ICD-10-CM | POA: Diagnosis not present

## 2017-03-07 ENCOUNTER — Encounter (INDEPENDENT_AMBULATORY_CARE_PROVIDER_SITE_OTHER): Payer: Self-pay | Admitting: Orthopedic Surgery

## 2017-03-07 ENCOUNTER — Ambulatory Visit (INDEPENDENT_AMBULATORY_CARE_PROVIDER_SITE_OTHER): Payer: Commercial Managed Care - HMO | Admitting: Orthopedic Surgery

## 2017-03-07 ENCOUNTER — Ambulatory Visit (INDEPENDENT_AMBULATORY_CARE_PROVIDER_SITE_OTHER): Payer: Commercial Managed Care - HMO

## 2017-03-07 VITALS — BP 153/112 | HR 88 | Resp 16 | Ht 65.5 in | Wt 230.0 lb

## 2017-03-07 DIAGNOSIS — M79672 Pain in left foot: Secondary | ICD-10-CM | POA: Diagnosis not present

## 2017-03-07 DIAGNOSIS — M7662 Achilles tendinitis, left leg: Secondary | ICD-10-CM | POA: Diagnosis not present

## 2017-03-07 NOTE — Progress Notes (Signed)
Office Visit Note   Patient: Sherry Dennis           Date of Birth: August 03, 1961           MRN: [id number] Visit Date: 03/07/2017              Requested by: Unk Pinto, [address] [place], [place] [address] PCP: Unk Pinto, MD   Assessment & Plan: Visit Diagnoses:  1. Tendonitis, Achilles, left   2. Pain of left heel     Plan:  #1: Equalizer boot with heel pad #2: She states she will be using Voltaren gel to this area 2-3 times a day and I told her to contact her medical doctor who is prescribing meloxicam if that will still be okay. #3: Ice and elevate as necessary.  Follow-Up Instructions: Return in about 3 weeks (around 03/28/2017).   Orders:  Orders Placed This Encounter  Procedures  . XR Ankle Complete Left   No orders of the defined types were placed in this encounter.     Procedures: No procedures performed   Clinical Data: No additional findings.   Subjective: Chief Complaint  Patient presents with  . Left Ankle - Pain  . Left Foot - Pain  . Foot Pain    Fell x 2 weeks, swelling, difficulty walking, Tylenol helps, not diabetic, no surgery  . Ankle Pain    Sherry Dennis is a 56 year old white female who is seen today for evaluation of her left foot. She states about 2 weeks ago she was with her grandson and apparently they were at the place like tumble bees and her sister grandson start to come off them at then the fall she came off the mat and twisted her foot. She states that her pain is mainly in the calcaneal region and posteriorly. Some of the ankle. She always has some swelling in the lateral aspect of her ankle. She does have a Sorbothane heel pad already in her shoe. Denies any neurovascular compromise. Ice causes more pain. Rest is been helpful. She has been recently diagnosed with possible rheumatoid arthritis and is awaiting rheumatology appointment for September. In the meantime they have been using meloxicam and steroids on  alternating.       Review of Systems  Constitutional: + see HPI, + nocturia Eyes: + blurry vision, no xerophthalmia ENT: no sore throat, no nodules palpated in throat, no dysphagia/odynophagia, no hoarseness, + occas. tinnitus Cardiovascular: no CP/palpitations/+ leg swelling Respiratory: no cough/+ SOB Gastrointestinal: no N/V/D/+ C/+ heartburn Musculoskeletal: + both: muscle/joint aches Skin: no rashes, + easy bruising Neurological: no tremors/numbness/tingling/dizziness Psychiatric: no depression/+ anxiety   Objective: Vital Signs: BP (!) 153/112 (BP Location: Right Arm, Patient Position: Sitting, Cuff Size: Normal)   Pulse 88   Resp 16   Ht 5' 5.5" (1.664 m)   Wt 230 lb (104.3 kg)   BMI 37.69 kg/m   Physical Exam  Constitutional: She is oriented to person, place, and time. She appears well-developed and well-nourished.  HENT:  Head: Normocephalic and atraumatic.  Eyes: Pupils are equal, round, and reactive to light. EOM are normal.  Pulmonary/Chest: Effort normal.  Neurological: She is alert and oriented to person, place, and time.  Skin: Skin is warm and dry.  Psychiatric: She has a normal mood and affect. Her behavior is normal. Judgment and thought content normal.    Ortho Exam  Today she has some mild swelling about the lateral ankle which she states is normal for  her. She is tender to palpation over the insertion of the Achilles tendon on the calcaneus. I do not feel any defects. This is more at the bony attachment. She does not have any plantar pain. No particular pain about the ankle itself medially or laterally to palpation.  Specialty Comments:  No specialty comments available.  Imaging: Xr Ankle Complete Left  Result Date: 03/07/2017 Three-view x-ray of the left ankle reveals a plantar calcaneal spurring as well as posterior calcaneal spurring at the insertion of the Achilles on the calcaneus. She does have a fairly significant Haglund's deformity. No  cystic changes are noted in the bone. She does have some tip talar changes at the medial malleolus and the talus no occult fractures.    PMFS History: Patient Active Problem List   Diagnosis Date Noted  . SVT (supraventricular tachycardia) ([facility]) 04/08/2015  . PSVT (paroxysmal supraventricular tachycardia) ([facility]) 01/03/2015  . Tobacco use disorder 12/08/2014  . Arthritis of knee, degenerative 08/31/2014  . Palpitations 06/23/2014  . History of knee surgery 05/12/2014  . S/P total knee arthroplasty 02/15/2014  . Arthritis, degenerative 11/24/2013  . Obesity 09/11/2013  . Hypertension   . Hyperlipidemia   . Thyroid disease   . Vitamin D deficiency    Past Medical History:  Diagnosis Date  . Arthritis    "knees, hands, ankles, ~ every joint" (04/08/2015)  . Basal cell carcinoma    "face, hands, chest" (04/08/2015)  . Chronic bronchitis ([facility])    "get it pretty much q yr" (04/08/2015)  . Dysrhythmia    palpitations occ; SVT 09/2013 s/p adenosine  . GERD (gastroesophageal reflux disease)   . H/O hiatal hernia   . Hx of cardiovascular stress test    Lexiscan Myoview 6/16: Ejection fraction is 60% and wall motion is normal. The study is normal. There is no scar or ischemia. This is a low risk scan.  . Hyperlipidemia   . Hypertension   . Hypothyroidism   . Palpitations   . Pneumonia 2015 X 1  . PONV (postoperative nausea and vomiting)   . Thyroid disease   . Vitamin D deficiency     Family History  Problem Relation Age of Onset  . Hypertension Mother   . Atrial fibrillation Mother   . Diabetes Father   . Emphysema Father   . COPD Father   . Breast cancer Paternal Aunt        x 4 aunts,   . Stroke Maternal Grandmother   . Heart attack Maternal Grandmother   . Hyperlipidemia Maternal Grandfather   . Heart disease Maternal Grandfather   . Hyperlipidemia Paternal Grandmother   . Heart disease Paternal Grandmother   . Hyperlipidemia Paternal Grandfather   . Heart disease  Paternal Grandfather     Past Surgical History:  Procedure Laterality Date  . BASAL CELL CARCINOMA EXCISION  2014   "off my chest"  . BLADDER REPAIR  1993   "lasered the holes shut"  . CARDIAC SURGERY    . CARPAL TUNNEL RELEASE Right 1990  . ELECTROPHYSIOLOGIC STUDY N/A 04/08/2015   Procedure: SVT Ablation;  Surgeon: Evans Lance, MD;  Location: [facility];  Service: Cardiovascular;  Laterality: N/A;  . ENDOMETRIAL ABLATION  2009  . INCONTINENCE SURGERY  2010  . JOINT REPLACEMENT    . KNEE ARTHROSCOPY Right 2013  . KNEE ARTHROSCOPY    . PARTIAL KNEE ARTHROPLASTY Right 02/15/2014   Procedure: RIGHT UNICOMPARTMENTAL KNEE (MEDIAL COMPARTMENT);  Surgeon: Vickey Huger, MD;  Location: [facility];  Service: Orthopedics;  Laterality: Right;  . TUBAL LIGATION  1987   Social History   Occupational History  . Not on file.   Social History Main Topics  . Smoking status: Current Some Day Smoker    Packs/day: 1.50    Years: 40.00    Types: Cigarettes  . Smokeless tobacco: Never Used     Comment: 04/08/2015 "went from ~ 2 ppd to 3 cigarettes in 1 week"  . Alcohol use No     Comment: 04/08/2015 "couple drinks/year"  . Drug use: No  . Sexual activity: Not Currently

## 2017-03-08 ENCOUNTER — Other Ambulatory Visit (INDEPENDENT_AMBULATORY_CARE_PROVIDER_SITE_OTHER): Payer: Commercial Managed Care - HMO

## 2017-03-08 DIAGNOSIS — E039 Hypothyroidism, unspecified: Secondary | ICD-10-CM | POA: Diagnosis not present

## 2017-03-08 LAB — TSH: TSH: 0.04 u[IU]/mL — ABNORMAL LOW (ref 0.35–4.50)

## 2017-03-08 LAB — T4, FREE: FREE T4: 1.55 ng/dL (ref 0.60–1.60)

## 2017-03-08 MED ORDER — SYNTHROID 150 MCG PO TABS: 150.0000 ug | ORAL_TABLET | Freq: Every day | ORAL | 1 refills | Status: DC

## 2017-03-12 ENCOUNTER — Encounter: Payer: Self-pay | Admitting: Neurology

## 2017-03-12 ENCOUNTER — Ambulatory Visit (INDEPENDENT_AMBULATORY_CARE_PROVIDER_SITE_OTHER): Payer: 59 | Admitting: Neurology

## 2017-03-12 VITALS — BP 160/90 | HR 76 | Ht 65.0 in | Wt 243.0 lb

## 2017-03-12 DIAGNOSIS — J441 Chronic obstructive pulmonary disease with (acute) exacerbation: Secondary | ICD-10-CM

## 2017-03-12 DIAGNOSIS — R002 Palpitations: Secondary | ICD-10-CM | POA: Diagnosis not present

## 2017-03-12 DIAGNOSIS — G4733 Obstructive sleep apnea (adult) (pediatric): Secondary | ICD-10-CM

## 2017-03-12 DIAGNOSIS — J439 Emphysema, unspecified: Secondary | ICD-10-CM

## 2017-03-12 DIAGNOSIS — R351 Nocturia: Secondary | ICD-10-CM

## 2017-03-12 DIAGNOSIS — R0683 Snoring: Secondary | ICD-10-CM

## 2017-03-12 DIAGNOSIS — J449 Chronic obstructive pulmonary disease, unspecified: Secondary | ICD-10-CM

## 2017-03-12 DIAGNOSIS — G4736 Sleep related hypoventilation in conditions classified elsewhere: Secondary | ICD-10-CM | POA: Diagnosis not present

## 2017-03-12 NOTE — Progress Notes (Signed)
SLEEP MED[facility]INE CLINIC   Provider:  Larey Seat, M D  Primary Care Physician:  Unk Pinto, MD   Referring Provider: Unk Pinto, MD    Chief Complaint  Patient presents with  . New Patient (Initial Visit)  alone   HPI:  Sherry Dennis is a 56 y.o. female , seen here as in a referral/ revisit  from Dr. Melford Aase for an evaluation of COPD overlap with possible OSA in this active smoker.  Sherry Dennis reports that she was just recently diagnosed with rheumatoid arthritis, has irritable bowel syndrome, she has prediabetes, vitamin D deficiency and a low thyroid-stimulating hormone she also is considered morbidly obese at a BMI of over 40 but she has recently developed more fatigue and daytime excessive daytime sleepiness more frequently waking up from sleep and has been told that she snores. In the morning she feels like she doesn't get enough sleep is not restored or refreshed. In addition she has had some ankle edema and swelling in her feet and legs shortness of breath, wheezing alongside with snoring. Her muscles ache and her joints hurt which makes it sometimes harder to find a restful position to sleep.  She also had a partial knee replacement in 2016 and a heart ablation in 2016 for  Wolff-Parkinson-White with tachycardia and palpitations.   Sleep habits are as follows: Sherry Dennis has an established bedtime at about 10 PM but she may not immediately go to sleep. Her average sleep time will be between 3 and 4 hours at night only. She has up to 4 or 5 bathroom breaks each night, fragmenting her sleep. She may stay asleep for about one hour at a time and is in the habit of looking at the clock when she wakes up.  Sleep medical history and family sleep history: has COPD, coughing and wheezing - The patient has developed insomnia over the last 10 years, attributed to hyperthyroidism . Her TSH is low. She struggled with thyroid disease 20 years and developed fatigue and a weight problem.  She sees Dr. Kayleen Memos on [facility] on [address] after she developed swollen wrists and hands-  she tested negative for gout but high for the rheumatoid factor. She is now referred for [facility] rheumatology, Dr. Leigh Aurora,   Social history: active smoker 1.5 ppd, since 1990- ETOH- rare- none in 2-3 years. Caffeine : 1 cup in AM , 1 tea in afternoon, lunch.  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 She works for the [employer] Not exercising - she is pain and she has achilles tendonitis.   Review of Systems: Out of a complete 14 system review, the patient complains of only the following symptoms, and all other reviewed systems are negative. Weight gain, fatigue, blurred vision, shortness of breath, wheezing, snoring, swelling in both ankles both legs, swelling of the wrist and hand, easy bruising and bleeding increased thirst, aching muscles or joint pain insomnia snoring and nocturnal coughing the feeling of not enough sleep, weakness dizziness. Epworth sleepiness score was endorsed at 15 points, fatigue severity score is endorsed at 45 points.      Social History   Social History  . Marital status: Divorced    Spouse name: N/A  . Number of children: N/A  . Years of education: N/A   Occupational History  .  Not on file.   Social History Main Topics  . Smoking status: Current Some Day Smoker    Packs/day: 1.50    Years: 40.00    Types: Cigarettes  . Smokeless tobacco: Never Used     Comment: 04/08/2015 "went from ~ 2 ppd to 3 cigarettes in 1 week"  . Alcohol use No     Comment: 04/08/2015 "couple drinks/year"  . Drug use: No  . Sexual activity: Not Currently   Other Topics Concern  . Not on file   Social History Narrative  . No narrative on file    Family History  Problem Relation Age of Onset  .  Hypertension Mother   . Atrial fibrillation Mother   . Diabetes Father   . Emphysema Father   . COPD Father   . Breast cancer Paternal Aunt        x 4 aunts,   . Stroke Maternal Grandmother   . Heart attack Maternal Grandmother   . Hyperlipidemia Maternal Grandfather   . Heart disease Maternal Grandfather   . Hyperlipidemia Paternal Grandmother   . Heart disease Paternal Grandmother   . Hyperlipidemia Paternal Grandfather   . Heart disease Paternal Grandfather     Past Medical History:  Diagnosis Date  . Arthritis    "knees, hands, ankles, ~ every joint" (04/08/2015)  . Basal cell carcinoma    "face, hands, chest" (04/08/2015)  . Chronic bronchitis ([facility])    "get it pretty much q yr" (04/08/2015)  . Dysrhythmia    palpitations occ; SVT 09/2013 s/p adenosine  . GERD (gastroesophageal reflux disease)   . H/O hiatal hernia   . Hx of cardiovascular stress test    Lexiscan Myoview 6/16: Ejection fraction is 60% and wall motion is normal. The study is normal. There is no scar or ischemia. This is a low risk scan.  . Hyperlipidemia   . Hypertension   . Hypothyroidism   . Palpitations   . Pneumonia 2015 X 1  . PONV (postoperative nausea and vomiting)   . Thyroid disease   . Vitamin D deficiency     Past Surgical History:  Procedure Laterality Date  . BASAL CELL CARCINOMA EXCISION  2014   "off my chest"  . BLADDER REPAIR  1993   "lasered the holes shut"  . CARDIAC SURGERY    . CARPAL TUNNEL RELEASE Right 1990  . ELECTROPHYSIOLOGIC STUDY N/A 04/08/2015   Procedure: SVT Ablation;  Surgeon: Evans Lance, MD;  Location: [facility];  Service: Cardiovascular;  Laterality: N/A;  . ENDOMETRIAL ABLATION  2009  . INCONTINENCE SURGERY  2010  . JOINT REPLACEMENT    . KNEE ARTHROSCOPY Right 2013  . KNEE ARTHROSCOPY    . PARTIAL KNEE ARTHROPLASTY Right 02/15/2014   Procedure: RIGHT UNICOMPARTMENTAL KNEE (MEDIAL COMPARTMENT);  Surgeon: Vickey Huger, MD;  Location: [facility];  Service:  Orthopedics;  Laterality: Right;  . TUBAL LIGATION  1987    Current Outpatient Prescriptions  Medication Sig Dispense Refill  . Acetaminophen (TYLENOL ARTHRITIS EXT RELIEF PO) Take by mouth 2 (two) times daily as needed.     . fluticasone (FLONASE) 50 MCG/ACT nasal spray Place 1 spray into both nostrils daily.    . hydrochlorothiazide (HYDRODIURIL) 25 MG tablet Take 1 tablet (25 mg total) by mouth daily as needed (fluid). 45 tablet 3  . ibuprofen (ADVIL,MOTRIN) 800 MG tablet Take 800 mg by mouth every 8 (eight) hours as needed for moderate pain.    Marland Kitchen linaclotide (  LINZESS) 72 MCG capsule Take 1 capsule (72 mcg total) by mouth daily before breakfast. 90 capsule 3  . meloxicam (MOBIC) 15 MG tablet Take 15 mg by mouth daily.    . metroNIDAZOLE (METROGEL) 0.75 % gel     . Multiple Vitamins-Minerals (EMERGEN-C FIVE PO) Take 1 Package by mouth daily.    Marland Kitchen OVER THE COUNTER MEDICATION Take 1 tablet by mouth every morning. Equate brand allergy Rx    . OVER THE COUNTER MEDICATION Take 1 tablet by mouth daily as needed (acid reducer). OTC Equate brand Acid Reducer    . sertraline (ZOLOFT) 50 MG tablet TAKE ONE TABLET BY MOUTH  DAILY 90 tablet 1  . SYNTHROID 150 MCG tablet Take 1 tablet (150 mcg total) by mouth daily before breakfast. 60 tablet 1  . VOLTAREN 1 % GEL Apply 2 g topically 4 (four) times daily. 4 Tube 2   No current facility-administered medications for this visit.     Allergies as of 03/12/2017 - Review Complete 03/12/2017  Allergen Reaction Noted  . Darvocet [propoxyphene n-acetaminophen] Anaphylaxis 12/21/2011  . Darvon [propoxyphene hcl] Anaphylaxis and Other (See Comments) 12/21/2011  . Propoxyphene Anaphylaxis 07/21/2015  . Oseltamivir Hives 07/21/2015  . Tamiflu [oseltamivir phosphate] Other (See Comments) 12/21/2011    Vitals: BP (!) 160/90 Comment: gave a few minutes to breathe came back and took manually  Pulse 76   Ht 5\' 5"  (1.651 m)   Wt 243 lb (110.2 kg)   BMI 40.44  kg/m  Last Weight:  Wt Readings from Last 1 Encounters:  03/12/17 243 lb (110.2 kg)   DDU:KGUR mass index is 40.44 kg/m.     Last Height:   Ht Readings from Last 1 Encounters:  03/12/17 5\' 5"  (1.651 m)    Physical exam:  General: The patient is awake, alert and appears not in acute distress. The patient is well groomed. Head: Normocephalic, atraumatic. Neck is supple. Mallampati 5 - the uvula is not visible, neck circumference:17.5 . Nasal airflow congested , Prognathia is seen.  Cardiovascular:  Regular rate and rhythm , without  murmurs or carotid bruit, and without distended neck veins. Respiratory:  Wheezing, coughing, bronchial restricition.  Skin:  Ankle edema, not rash Trunk: BMI is 40. The patient's posture is erect   Neurologic exam : The patient is awake and alert, oriented to place and time.    Attention span & concentration ability appears normal.  Speech is fluent, without dysarthria,but hoarse Mood and affect are appropriate.  Cranial nerves: Pupils are equal and briskly reactive to light.  Funduscopic exam without evidence of pallor or edema. Extraocular movements  in vertical and horizontal planes intact and without nystagmus. Visual fields by finger perimetry are intact. Hearing to finger rub intact.   Facial sensation intact to fine touch.  Facial motor strength is symmetric and tongue and uvula move midline. Shoulder shrug was symmetrical.   Motor exam:  Normal tone, muscle bulk and symmetric strength in all extremities. Sensory:  Fine touch, pinprick and vibration were tested in all extremities. Proprioception tested in the upper extremities was normal. Coordination: Rapid alternating movements in the fingers/hands was normal. Finger-to-nose maneuver  normal without evidence of ataxia, dysmetria or tremor. Gait and station: Patient walks without assistive device and is able unassisted to climb up to the exam table. Strength within normal limits.  Stance is  stable and normal.  Deep tendon reflexes: in the  upper and lower extremities are symmetric and intact.    Assessment:  After physical and neurologic examination, review of laboratory studies,  Personal review of imaging studies, reports of other /same  Imaging studies, results of polysomnography and / or neurophysiology testing and pre-existing records as far as provided in visit., my assessment is   1) Sherry Dennis has COPD with restricted lung capacity, pulmonary capacity. She is wheezing and has currently cough and sinus drainage. She has multiple risk factors for obstructive sleep apnea including her body mass index, neck circumference, and the visible uvula indicating a very high-grade Mallampati.   Given that she is also suffering currently from a congested nasal passage she will have to breathe through the mouth which will explain her snoring.  Her children have told her that she snores ever since they were small, so a long time before she gained weight.    there is no doubt that this patient will has apnea with a hep very high risk of hypercapnia and hypoxemia. This may explain some of the morning lightheadedness, dizziness, sometimes waking up with palpitations of feeling short of breath.  She had her last cardiac monitor prior to her cardiac ablation procedure in 2016. However, clamminess, diaphoresis, lightheadedness and palpitations still accompanied her arousals out of sleep.  The patient was advised of the nature of the diagnosed disorder , the treatment options and the  risks for general health and wellness arising from not treating the condition.   I spent more than 45 minutes of face to face time with the patient.  Greater than 50% of time was spent in counseling and coordination of care. We have discussed the diagnosis and differential and I answered the patient's questions.    Plan:  Treatment plan and additional workup :  Sherry Dennis needs an attended sleep study with capnography  and oximetry. Patient was an overlap COPD and obstructive sleep apnea disorder is at a very high risk of also producing tachybradycardia arrhythmia, manifesting as palpitations, shortness of breath and diaphoresis. All the symptoms of familiar to the patient. I would like for her to have her study as soon as possible and we will meet with the results and discuss possible treatment. Should her sleep study clearly indicate that she needs interventional care she will receive this before she sees me  for follow-up .   Larey Seat, MD 08/13/7508, 2:58 PM  Certified in Neurology by ABPN Certified in [place] by [facility] [address], [place], [place] [address]

## 2017-03-12 NOTE — Patient Instructions (Signed)
Sleep Studies A sleep study (polysomnogram) is a series of tests done while you are sleeping. It can show how well you sleep. This can help your health care provider diagnose a sleep disorder and show how severe your sleep disorder is. A sleep study may lead to treatment that will help you sleep better and prevent other medical problems caused by poor sleep. If you have a sleep disorder, you may also be at risk for:  Sleep-related accidents.  High blood pressure.  Heart disease.  Stroke.  Other medical conditions.  Sleep disorders are common. Your health care provider may suspect a sleep disorder if you:  Have loud snoring most nights.  Have brief periods when you stop breathing at night.  Feel sleepy on most days.  Fall asleep suddenly during the day.  Have trouble falling asleep or staying asleep.  Feel like you need to move your legs when trying to fall asleep.  Have dreams that seem very real shortly after falling asleep.  Feel like you cannot move when you first wake up.  Which tests will I need to have? Most sleep studies last all night and include these tests:  Recordings of your brain activity.  Recordings of your eye movements.  Recording of your heart rate and rhythm.  Blood pressure readings.  Readings of the amount of oxygen in your blood.  Measurements of your chest and belly movement as you breathe during sleep.  If you have signs of the sleep disorder called sleep apnea during your test, you may get a mask to wear for the second half of the night.  The mask provides continuous positive airway pressure (CPAP). This may improve sleep apnea significantly.  You will then have all tests done again with the mask in place to see if your measurements and recordings change.  How are sleep studies done? Most sleep studies are done over one full night of sleep.  You will arrive at the study center in the evening and can go home in the morning.  Bring your  pajamas and toothbrush.  Do not have caffeine on the day of your sleep study.  Your health care provider will let you know if you need to stop taking any of your regular medicines before the test.  To do the tests included in a polysomnogram, you will have:  Round, sticky patches with sensors attached to recording wires (electrodes) placed on your scalp, face, chest, and limbs.  Wires from all the electrodes and sensors run from your bed to a computer. The wires can be taken off and put back on if you need to get out of bed to go to the bathroom.  A sensor placed over your nose to measure airflow.  A finger clip put on one finger to measure your blood oxygen level.  A belt around your belly and a belt around your chest to measure breathing movements.  Where are sleep studies done? Sleep studies are done at sleep centers. A sleep center may be inside a hospital, office, or clinic. The room where you have the study may look like a hospital room or a hotel room. The health care providers doing the study may come in and out of the room during the study. Most of the time, they will be in another room monitoring your test. How is information from sleep studies helpful? A polysomnogram can be used along with your medical history and a physical exam to diagnose conditions, such as:  Sleep apnea.    Restless legs syndrome.  Sleep-related seizure disorders.  Sleep-related movement disorders.  A medical doctor who specializes in sleep will evaluate your sleep study. The specialist will share the results with your primary health care provider. Treatments based on your sleep study may include:  Improving your sleep habits (sleep hygiene).  Wearing a CPAP mask.  Wearing an oral device at night to improve breathing and reduce snoring.  Taking medicine for: ? Restless legs syndrome. ? Sleep-related seizure disorder. ? Sleep-related movement disorder.  This information is not intended to  replace advice given to you by your health care provider. Make sure you discuss any questions you have with your health care provider. Document Released: 02/03/2003 Document Revised: 03/25/2016 Document Reviewed: 10/05/2013 Elsevier Interactive Patient Education  2018 [employer]. Bronchiectasis Bronchiectasis is a condition in which the airways (bronchi) are damaged and widened. This makes it difficult for the lungs to get rid of mucus. As a result, mucus gathers in the airways, and this often leads to lung infections. Infection can cause inflammation in the airways, which may further weaken and damage the bronchi. What are the causes? Bronchiectasis may be present at birth (congenital) or may develop later in life. Sometimes there is no apparent cause. Some common causes include:  Cystic fibrosis.  Recurrent lung infections (such as pneumonia, tuberculosis, or fungal infections).  Foreign bodies or other blockages in the lungs.  Breathing in fluid, food, or other foreign objects (aspiration).  What are the signs or symptoms? Common symptoms include:  A daily cough that brings up mucus and lasts for more than 3 weeks.  Frequent lung infections (such as pneumonia, tuberculosis, or fungal infections).  Shortness of breath and wheezing.  Weakness and fatigue.  How is this diagnosed? Various tests may be done to help diagnose bronchiectasis. Tests may include:  Chest X-rays or CT scans.  Breathing tests to help determine how your lungs are working.  Sputum cultures to check for infection.  Blood tests and other tests to check for related diseases or causes, such as cystic fibrosis.  How is this treated? Treatment varies depending on the severity of the condition. Medicines may be given to loosen the mucus to be coughed up (expectorants), to relax the muscles of the air passages (bronchodilators), or to prevent or treat infections (antibiotics). Physical therapy methods may be  recommended to help clear mucus from the lungs. For severe cases, surgery may be done to remove the affected part of the lung. Follow these instructions at home:  Get plenty of rest.  Only take over-the-counter or prescription medicines as directed by your health care provider. If antibiotic medicines were prescribed, take them as directed. Finish them even if you start to feel better.  Avoid sedatives and antihistamines unless otherwise directed by your health care provider. These medicines tend to thicken the mucus in the lungs.  Perform any breathing exercises or techniques to clear the lungs as directed by your health care provider.  Drink enough fluids to keep your urine clear or pale yellow.  Consider using a cold steam vaporizer or humidifier in your room or home to help loosen secretions.  If the cough is worse at night, try sleeping in a semi-upright position in a recliner or using a couple of pillows.  Avoid cigarette smoke and lung irritants. If you smoke, quit.  Stay inside when pollution and ozone levels are high.  Stay current with vaccinations and immunizations.  Follow up with your health care provider as directed. Contact  a health care provider if:  You cough up more thick, discolored mucus (sputum) that is yellow to green in color.  You have a fever or persistent symptoms for more than 2-3 days.  You cannot control your cough and are losing sleep. Get help right away if:  You cough up blood.  You have chest pain or increasing shortness of breath.  You have pain that is getting worse or is uncontrolled with medicines.  You have a fever and your symptoms suddenly get worse. This information is not intended to replace advice given to you by your health care provider. Make sure you discuss any questions you have with your health care provider. Document Released: 05/27/2007 Document Revised: 01/11/2016 Document Reviewed: 02/04/2013 Elsevier Interactive Patient  Education  2017 [employer].

## 2017-03-14 ENCOUNTER — Encounter: Payer: Self-pay | Admitting: Internal Medicine

## 2017-03-27 ENCOUNTER — Ambulatory Visit (INDEPENDENT_AMBULATORY_CARE_PROVIDER_SITE_OTHER): Payer: Commercial Managed Care - HMO | Admitting: Orthopedic Surgery

## 2017-04-01 ENCOUNTER — Encounter: Payer: Self-pay | Admitting: Neurology

## 2017-04-01 ENCOUNTER — Telehealth: Payer: Self-pay | Admitting: Neurology

## 2017-04-01 DIAGNOSIS — F17201 Nicotine dependence, unspecified, in remission: Secondary | ICD-10-CM

## 2017-04-01 DIAGNOSIS — I471 Supraventricular tachycardia: Secondary | ICD-10-CM

## 2017-04-01 DIAGNOSIS — J441 Chronic obstructive pulmonary disease with (acute) exacerbation: Secondary | ICD-10-CM

## 2017-04-01 DIAGNOSIS — I10 Essential (primary) hypertension: Secondary | ICD-10-CM

## 2017-04-01 NOTE — Telephone Encounter (Signed)
SPLIT with capnography needed. IF [employer] declines, will try HST.

## 2017-04-01 NOTE — Telephone Encounter (Signed)
Patient called to schedule sleep study but I don't have an order.  Can I get an order for a sleep study?

## 2017-04-08 DIAGNOSIS — M79642 Pain in left hand: Secondary | ICD-10-CM | POA: Diagnosis not present

## 2017-04-08 DIAGNOSIS — M059 Rheumatoid arthritis with rheumatoid factor, unspecified: Secondary | ICD-10-CM | POA: Diagnosis not present

## 2017-04-19 DIAGNOSIS — M0579 Rheumatoid arthritis with rheumatoid factor of multiple sites without organ or systems involvement: Secondary | ICD-10-CM | POA: Diagnosis not present

## 2017-04-19 DIAGNOSIS — M7989 Other specified soft tissue disorders: Secondary | ICD-10-CM | POA: Diagnosis not present

## 2017-04-19 DIAGNOSIS — R5383 Other fatigue: Secondary | ICD-10-CM | POA: Diagnosis not present

## 2017-04-19 DIAGNOSIS — M2550 Pain in unspecified joint: Secondary | ICD-10-CM | POA: Diagnosis not present

## 2017-04-25 ENCOUNTER — Encounter (INDEPENDENT_AMBULATORY_CARE_PROVIDER_SITE_OTHER): Payer: Self-pay | Admitting: Orthopedic Surgery

## 2017-04-25 ENCOUNTER — Ambulatory Visit (INDEPENDENT_AMBULATORY_CARE_PROVIDER_SITE_OTHER): Payer: 59 | Admitting: Orthopedic Surgery

## 2017-04-25 VITALS — BP 155/100 | HR 92 | Resp 12 | Ht 60.0 in | Wt 243.0 lb

## 2017-04-25 DIAGNOSIS — M7541 Impingement syndrome of right shoulder: Secondary | ICD-10-CM | POA: Diagnosis not present

## 2017-04-25 DIAGNOSIS — M7542 Impingement syndrome of left shoulder: Secondary | ICD-10-CM

## 2017-04-25 MED ORDER — LIDOCAINE HCL 1 % IJ SOLN
3.0000 mL | INTRAMUSCULAR | Status: AC | PRN
  Administered 2017-04-25: 3 mL

## 2017-04-25 MED ORDER — METHYLPREDNISOLONE ACETATE 40 MG/ML IJ SUSP
80.0000 mg | INTRAMUSCULAR | Status: AC | PRN
  Administered 2017-04-25: 80 mg

## 2017-04-25 MED ORDER — BUPIVACAINE HCL 0.5 % IJ SOLN
2.0000 mL | INTRAMUSCULAR | Status: AC | PRN
  Administered 2017-04-25: 2 mL via INTRA_ARTICULAR

## 2017-04-25 NOTE — Progress Notes (Signed)
Office Visit Note   Patient: Sherry Dennis           Date of Birth: [birth year]           MRN: [id number] Visit Date: 04/25/2017              Requested by: Unk Pinto, [address] [place], [place] [address] PCP: Unk Pinto, MD   Assessment & Plan: Visit Diagnoses:  1. Impingement syndrome of left shoulder   2. Impingement syndrome of right shoulder     Plan:  #1: Subacromial corticosteroid injections to both shoulders #2: She can follow back up with [place] on a when necessary basis  Follow-Up Instructions: Return if symptoms worsen or fail to improve.   Orders:  No orders of the defined types were placed in this encounter.  No orders of the defined types were placed in this encounter.     Procedures: Large Joint Inj Date/Time: 04/25/2017 10:26 AM Performed by: Biagio Borg D Authorized by: Biagio Borg D   Consent Given by:  Patient Timeout: prior to procedure the correct patient, procedure, and site was verified   Indications:  Pain Location:  Shoulder Site:  L subacromial bursa Prep: patient was prepped and draped in usual sterile fashion   Needle Size:  25 G Needle Length:  1.5 inches Approach:  Lateral Ultrasound Guidance: No   Fluoroscopic Guidance: No   Arthrogram: No   Medications:  80 mg methylPREDNISolone acetate 40 MG/ML; 2 mL bupivacaine 0.5 %; 3 mL lidocaine 1 % Aspiration Attempted: No   Patient tolerance:  Patient tolerated the procedure well with no immediate complications Large Joint Inj Date/Time: 04/25/2017 10:27 AM Performed by: Biagio Borg D Authorized by: Biagio Borg D   Consent Given by:  Patient Timeout: prior to procedure the correct patient, procedure, and site was verified   Indications:  Pain Location:  Shoulder Site:  R subacromial bursa Prep: patient was prepped and draped in usual sterile fashion   Needle Size:  25 G Needle Length:  1.5 inches Approach:  Lateral Ultrasound Guidance:  No   Fluoroscopic Guidance: No   Arthrogram: No   Medications:  80 mg methylPREDNISolone acetate 40 MG/ML; 2 mL bupivacaine 0.5 %; 3 mL lidocaine 1 % Aspiration Attempted: No   Patient tolerance:  Patient tolerated the procedure well with no immediate complications     Clinical Data: No additional findings.   Subjective: Chief Complaint  Patient presents with  . Left Shoulder - Pain    Ms. Sherry Dennis is a 56 y o that presents with L shoulder pain x 1 year. She also has RA and is having a flare up currently with the Right side more aftected.    Sherry Dennis is a very pleasant 56 year old white female who is seen today for evaluation of both shoulders. She does have rheumatoid arthritis and is taken care by Dr. Amil Amen at [facility] rheumatology. She's been having some recent flares. I have seen her on multiple occasions for essentially impingement syndrome of both shoulders. She presents today with now both shoulders being painful and would like to consider corticosteroid injections. Her last injection was April on the left shoulder. It was beneficial. Both shoulders are painful.    Review of Systems  Constitutional: Negative for chills, fatigue and fever.  Eyes: Negative for itching.  Respiratory: Negative for chest tightness and shortness of breath.   Cardiovascular: Negative for chest pain, palpitations and leg swelling.  Gastrointestinal: Negative for blood in  stool, constipation and diarrhea.  Endocrine: Positive for polyuria.  Genitourinary: Negative for dysuria.  Musculoskeletal: Positive for joint swelling, neck pain and neck stiffness. Negative for back pain.  Allergic/Immunologic: Positive for immunocompromised state.  Neurological: Positive for headaches. Negative for dizziness and numbness.  Hematological: Does not bruise/bleed easily.  Psychiatric/Behavioral: Positive for sleep disturbance. The patient is not nervous/anxious.      Objective: Vital Signs: BP (!) 155/100    Pulse 92   Resp 12   Ht 5' (1.524 m)   Wt 243 lb (110.2 kg)   BMI 47.46 kg/m   Physical Exam  Constitutional: She is oriented to person, place, and time. She appears well-developed and well-nourished.  HENT:  Head: Normocephalic and atraumatic.  Eyes: Pupils are equal, round, and reactive to light. EOM are normal.  Pulmonary/Chest: Effort normal.  Neurological: She is alert and oriented to person, place, and time.  Skin: Skin is warm and dry.  Psychiatric: She has a normal mood and affect. Her behavior is normal. Judgment and thought content normal.    Ortho Exam  She only has the 60 of external rotation and 30 of internal rotation bilaterally. Abduction to 90 forward flexion to 105. Positive empty can test. Good strength though with testing.  Specialty Comments:  No specialty comments available.  Imaging: No results found.   PMFS History: Patient Active Problem List   Diagnosis Date Noted  . SVT (supraventricular tachycardia) ([place]) 04/08/2015  . PSVT (paroxysmal supraventricular tachycardia) ([place]) 01/03/2015  . Tobacco use disorder 12/08/2014  . Arthritis of knee, degenerative 08/31/2014  . Palpitations 06/23/2014  . History of knee surgery 05/12/2014  . S/P total knee arthroplasty 02/15/2014  . Arthritis, degenerative 11/24/2013  . Obesity 09/11/2013  . Hypertension   . Hyperlipidemia   . Thyroid disease   . Vitamin D deficiency    Past Medical History:  Diagnosis Date  . Arthritis    "knees, hands, ankles, ~ every joint" (04/08/2015)  . Basal cell carcinoma    "face, hands, chest" (04/08/2015)  . Chronic bronchitis ([place])    "get it pretty much q yr" (04/08/2015)  . Dysrhythmia    palpitations occ; SVT 09/2013 s/p adenosine  . GERD (gastroesophageal reflux disease)   . H/O hiatal hernia   . Hx of cardiovascular stress test    Lexiscan Myoview 6/16: Ejection fraction is 60% and wall motion is normal. The study is normal. There is no scar or ischemia. This is  a low risk scan.  . Hyperlipidemia   . Hypertension   . Hypothyroidism   . Palpitations   . Pneumonia 2015 X 1  . PONV (postoperative nausea and vomiting)   . Thyroid disease   . Vitamin D deficiency     Family History  Problem Relation Age of Onset  . Hypertension Mother   . Atrial fibrillation Mother   . Diabetes Father   . Emphysema Father   . COPD Father   . Breast cancer Paternal Aunt        x 4 aunts,   . Stroke Maternal Grandmother   . Heart attack Maternal Grandmother   . Hyperlipidemia Maternal Grandfather   . Heart disease Maternal Grandfather   . Hyperlipidemia Paternal Grandmother   . Heart disease Paternal Grandmother   . Hyperlipidemia Paternal Grandfather   . Heart disease Paternal Grandfather     Past Surgical History:  Procedure Laterality Date  . BASAL CELL CARCINOMA EXCISION  2014   "off my chest"  .  BLADDER REPAIR  1993   "lasered the holes shut"  . CARDIAC SURGERY    . CARPAL TUNNEL RELEASE Right 1990  . ELECTROPHYSIOLOGIC STUDY N/A 04/08/2015   Procedure: SVT Ablation;  Surgeon: Evans Lance, MD;  Location: [facility];  Service: Cardiovascular;  Laterality: N/A;  . ENDOMETRIAL ABLATION  2009  . INCONTINENCE SURGERY  2010  . JOINT REPLACEMENT    . KNEE ARTHROSCOPY Right 2013  . KNEE ARTHROSCOPY    . PARTIAL KNEE ARTHROPLASTY Right 02/15/2014   Procedure: RIGHT UNICOMPARTMENTAL KNEE (MEDIAL COMPARTMENT);  Surgeon: Vickey Huger, MD;  Location: [facility];  Service: Orthopedics;  Laterality: Right;  . TUBAL LIGATION  1987   Social History   Occupational History  . Not on file.   Social History Main Topics  . Smoking status: Current Some Day Smoker    Packs/day: 1.50    Years: 40.00    Types: Cigarettes  . Smokeless tobacco: Never Used     Comment: 04/08/2015 "went from ~ 2 ppd to 3 cigarettes in 1 week"  . Alcohol use No     Comment: 04/08/2015 "couple drinks/year"  . Drug use: No  . Sexual activity: Not Currently

## 2017-05-05 DIAGNOSIS — M25562 Pain in left knee: Secondary | ICD-10-CM | POA: Diagnosis not present

## 2017-05-05 DIAGNOSIS — E0590 Thyrotoxicosis, unspecified without thyrotoxic crisis or storm: Secondary | ICD-10-CM | POA: Diagnosis not present

## 2017-05-17 DIAGNOSIS — Z719 Counseling, unspecified: Secondary | ICD-10-CM | POA: Diagnosis not present

## 2017-05-19 ENCOUNTER — Other Ambulatory Visit: Payer: Self-pay | Admitting: Internal Medicine

## 2017-05-19 MED ORDER — LINACLOTIDE 72 MCG PO CAPS: 72.0000 ug | ORAL_CAPSULE | Freq: Every day | ORAL | 1 refills | Status: DC

## 2017-05-22 DIAGNOSIS — M069 Rheumatoid arthritis, unspecified: Secondary | ICD-10-CM | POA: Diagnosis not present

## 2017-05-22 DIAGNOSIS — Z79899 Other long term (current) drug therapy: Secondary | ICD-10-CM | POA: Diagnosis not present

## 2017-05-22 DIAGNOSIS — M25562 Pain in left knee: Secondary | ICD-10-CM | POA: Diagnosis not present

## 2017-05-23 ENCOUNTER — Encounter: Payer: Self-pay | Admitting: Physician Assistant

## 2017-06-03 ENCOUNTER — Encounter: Payer: Self-pay | Admitting: Internal Medicine

## 2017-06-03 ENCOUNTER — Ambulatory Visit (INDEPENDENT_AMBULATORY_CARE_PROVIDER_SITE_OTHER): Payer: 59 | Admitting: Internal Medicine

## 2017-06-03 VITALS — BP 170/106 | HR 80 | Temp 97.5°F | Resp 18 | Ht 65.0 in | Wt 242.2 lb

## 2017-06-03 DIAGNOSIS — J029 Acute pharyngitis, unspecified: Secondary | ICD-10-CM

## 2017-06-03 DIAGNOSIS — J4520 Mild intermittent asthma, uncomplicated: Secondary | ICD-10-CM | POA: Diagnosis not present

## 2017-06-03 DIAGNOSIS — J0140 Acute pansinusitis, unspecified: Secondary | ICD-10-CM | POA: Diagnosis not present

## 2017-06-03 DIAGNOSIS — M0579 Rheumatoid arthritis with rheumatoid factor of multiple sites without organ or systems involvement: Secondary | ICD-10-CM | POA: Diagnosis not present

## 2017-06-03 DIAGNOSIS — I10 Essential (primary) hypertension: Secondary | ICD-10-CM

## 2017-06-03 DIAGNOSIS — J0410 Acute tracheitis without obstruction: Secondary | ICD-10-CM | POA: Diagnosis not present

## 2017-06-03 MED ORDER — DEXAMETHASONE 0.5 MG PO TABS: ORAL_TABLET | ORAL | 0 refills | Status: DC

## 2017-06-03 MED ORDER — AZITHROMYCIN 250 MG PO TABS: ORAL_TABLET | ORAL | 1 refills | Status: DC

## 2017-06-03 MED ORDER — LISINOPRIL-HYDROCHLOROTHIAZIDE 20-12.5 MG PO TABS: ORAL_TABLET | ORAL | 1 refills | Status: DC

## 2017-06-03 NOTE — Progress Notes (Signed)
[facility]   Unk Pinto, M.D.    Sherry Dennis. Sherry Dennis, P.A.-C      Sherry Dennis, [place]                [address] [place] [address] Telephone [phone] Telefax [phone]  Subjective:    Patient ID: Sherry Dennis, female    DOB: 07-02-1961, 56 y.o.   MRN: [id number]  HPI  Patient is a very nice 56 yo DWF sent home from work today for elevated BP's up too 180/120. She also reports sx's of Sinus HA's, pressure/congestion, S/T and productive cough. She denies CP, palpitations dyspnea fevers, chills, sweats or dyspnea.  Medication Sig  . TYLENOL ARTHRITIS EXT RELIEF Take by mouth 2 (two) times daily as needed.   Marland Kitchen FLONASE nasal spray Place 1 spray into both nostrils daily.  Marland Kitchen leflunomide (ARAVA) 20 MG tablet Take 20 mg by mouth daily.  Marland Kitchen linaclotide (LINZESS) 72 MCG capsule Take 1 capsule (72 mcg total) by mouth daily before breakfast.  . metroNIDAZOLE0.75 % gel   . Multiple Vitamins-Minerals Take 1 Package by mouth daily.  Sherry Dennis brand allergy Rx Take 1 tablet by mouth every morning.   . OTC Equate brand Acid Reducer Take 1 tablet by mouth daily as needed (acid reducer).   . sertraline (ZOLOFT) 50 MG tablet TAKE ONE TABLET BY MOUTH  DAILY  . SYNTHROID 150 MCG tablet Take 1 tablet (150 mcg total) by mouth daily before breakfast.  . VOLTAREN 1 % GEL Apply 2 g topically 4 (four) times daily.  Marland Kitchen ibuprofen  800 MG tablet Take  every 8 (eight) hours as needed for moderate pain.   Allergies  Allergen Reactions  . Darvocet [Propoxyphene N-Acetaminophen] Anaphylaxis  . Darvon [Propoxyphene Hcl] Anaphylaxis and Other (See Comments)    Airway swelling   . Propoxyphene Anaphylaxis    Throat closes up  . Oseltamivir Hives    Hives, elevated BP  . Tamiflu [Oseltamivir Phosphate] Other (See Comments)    Increased blood pressure Hives    Review of Systems  10 point systems review  negative except as above.    Objective:   Physical Exam  BP  170/106   P 80   T 97.5 F    R 18   Ht 5\' 5"   Wt 242 lb    BMI 40.30     No respiratory distress . Dry wheezy cough. Morbidly obese.  BP recheck at 160/100 (Rt wrist cuff)   HEENT - Eac's patent. TM's Nl. EOM's full. PERRLA. (+) tender frontomaxillary sinus areas.  NasoOroPharynx 2(+) injected w/o exudates.. Neck - supple. Nl Thyroid. Carotids 2+ & No bruits, nodes, JVD Chest -  BS w/scattered rales, rhonchi and fine expiratory wheezes. Cor - Nl HS. RRR w/o sig m. No edema. MS- FROM w/o deformities. Muscle power, tone and bulk Nl. Gait Nl. Neuro - No obvious Cr N abnormalities. Sensory, motor and Cerebellar functions appear Nl w/o focal abnormalities    Assessment & Plan:   1. Essential hypertension  - lisinopril-hydrochlorothiazide (ZESTORETIC) 20-12.5 MG tablet; Take 1 tablet daily for BP  Dispense: 90 tablet; Refill: 1  - Recc BID monitoring with Omron wrist cuff and return in 2 - 3 weeks with list of BP's.  2. Acute pansinusitis  3. Pharyngitis  4. Tracheitis  - azithromycin (ZITHROMAX) 250  MG tablet; Take 2 tablets (500 mg) on  Day 1,  followed by 1 tablet (250 mg) once daily on Days 2 through 5.  Dispense: 6 each; Refill: 1  - dexamethasone (DECADRON) 0.5 MG tablet; Take 1 tab 3 x day - 3 days, then 2 x day - 3 days, then 1 tab daily  Dispense: 20 tablet; Refill: 0   5. Mild intermittent asthma, unspecified whether complicated  - dexamethasone (DECADRON) 0.5 MG tablet; Take 1 tab 3 x day - 3 days, then 2 x day - 3 days, then 1 tab daily  Dispense: 20 tablet; Refill: 0

## 2017-06-09 DIAGNOSIS — M79671 Pain in right foot: Secondary | ICD-10-CM | POA: Diagnosis not present

## 2017-06-11 ENCOUNTER — Other Ambulatory Visit: Payer: Self-pay

## 2017-06-11 MED ORDER — LINACLOTIDE 72 MCG PO CAPS: 72.0000 ug | ORAL_CAPSULE | Freq: Every day | ORAL | 1 refills | Status: DC

## 2017-06-18 DIAGNOSIS — M0579 Rheumatoid arthritis with rheumatoid factor of multiple sites without organ or systems involvement: Secondary | ICD-10-CM | POA: Diagnosis not present

## 2017-06-26 ENCOUNTER — Ambulatory Visit: Payer: Self-pay | Admitting: Internal Medicine

## 2017-06-26 DIAGNOSIS — M25562 Pain in left knee: Secondary | ICD-10-CM | POA: Diagnosis not present

## 2017-06-26 DIAGNOSIS — M129 Arthropathy, unspecified: Secondary | ICD-10-CM | POA: Diagnosis not present

## 2017-06-26 DIAGNOSIS — N951 Menopausal and female climacteric states: Secondary | ICD-10-CM | POA: Diagnosis not present

## 2017-06-30 NOTE — Progress Notes (Signed)
Assessment and Plan:  Sherry Dennis was seen today for follow-up.  Diagnoses and all orders for this visit:  Essential hypertension Continue medications Monitor blood pressure at home; call if consecutively over 150/90 - goal below 130/80 Continue DASH diet.   Reminder to go to the ER if any CP, SOB, nausea, dizziness, severe HA, changes vision/speech, left arm numbness and tingling and jaw pain.  Internal hemorrhoids -     hydrocortisone (ANUSOL-HC) 25 MG suppository; Place 1 suppository (25 mg total) 2 (two) times daily as needed rectally for hemorrhoids. -will treat for symptoms, has previously been treated by GI with clip- will refer back if symptoms do not improve Sitz baths, increase fiber, increase water, can sit on donut, check CBC, BMP,  Hydrocortisone supp given. If worse go to the ER.   Further disposition pending results of labs. Discussed med's effects and SE's.   Over 30 minutes of exam, counseling, chart review, and critical decision making was performed.   Future Appointments  Date Time Provider [place]  07/24/2017 10:00 AM Vicie Mutters, PA-C [name] None    ------------------------------------------------------------------------------------------------------------------   HPI BP 124/82   Pulse (!) 102   Temp 98.1 F (36.7 C)   Ht 5\' 5"  (1.651 m)   Wt 233 lb (105.7 kg)   SpO2 95%   BMI 38.77 kg/m   56 y.o.female presents for follow up on hypertension; she was last seen 3 weeks ago after she was sent home from work with BP of 180/120 - she was not prescribed medication for this issue at that time. She was initiated on combination med lisinopril-hctz 20-12.5 mg tab daily and instructed to keep a log of daily BPs to present. She has been checking BPs at home as low as 106/80 - highest 154/80 (was having an anxious moment). She denies HA, vision, changes, CP/dyspnea, palpitations, calf pain.   She reports her internal hemorroids have been flaring over the  past week and is experiencing some pain with firm stools; she reports she has seen GI for this and had clips placed. She denies noting blood on her stool. She has started taking miralax to soften stools.   Past Medical History:  Diagnosis Date  . Arthritis    "knees, hands, ankles, ~ every joint" (04/08/2015)  . Basal cell carcinoma    "face, hands, chest" (04/08/2015)  . Chronic bronchitis ([place])    "get it pretty much q yr" (04/08/2015)  . Dysrhythmia    palpitations occ; SVT 09/2013 s/p adenosine  . GERD (gastroesophageal reflux disease)   . H/O hiatal hernia   . Hx of cardiovascular stress test    Lexiscan Myoview 6/16: Ejection fraction is 60% and wall motion is normal. The study is normal. There is no scar or ischemia. This is a low risk scan.  . Hyperlipidemia   . Hypertension   . Hypothyroidism   . Palpitations   . Pneumonia 2015 X 1  . PONV (postoperative nausea and vomiting)   . Thyroid disease   . Vitamin D deficiency      Allergies  Allergen Reactions  . Darvocet [Propoxyphene N-Acetaminophen] Anaphylaxis  . Darvon [Propoxyphene Hcl] Anaphylaxis and Other (See Comments)    Airway swelling   . Propoxyphene Anaphylaxis    Throat closes up  . Oseltamivir Hives    Hives, elevated BP  . Tamiflu [Oseltamivir Phosphate] Other (See Comments)    Increased blood pressure Hives     Current Outpatient Medications on File Prior to Visit  Medication  Sig  . Acetaminophen (TYLENOL ARTHRITIS EXT RELIEF PO) Take by mouth 2 (two) times daily as needed.   Marland Kitchen azithromycin (ZITHROMAX) 250 MG tablet Take 2 tablets (500 mg) on  Day 1,  followed by 1 tablet (250 mg) once daily on Days 2 through 5.  . dexamethasone (DECADRON) 0.5 MG tablet Take 1 tab 3 x day - 3 days, then 2 x day - 3 days, then 1 tab daily  . fluticasone (FLONASE) 50 MCG/ACT nasal spray Place 1 spray into both nostrils daily.  Marland Kitchen leflunomide (ARAVA) 20 MG tablet Take 20 mg by mouth daily.  Marland Kitchen linaclotide (LINZESS) 72 MCG  capsule Take 1 capsule (72 mcg total) by mouth daily before breakfast.  . lisinopril-hydrochlorothiazide (ZESTORETIC) 20-12.5 MG tablet Take 1 tablet daily for BP  . metroNIDAZOLE (METROGEL) 0.75 % gel   . Multiple Vitamins-Minerals (EMERGEN-C FIVE PO) Take 1 Package by mouth daily.  Marland Kitchen OVER THE COUNTER MEDICATION Take 1 tablet by mouth every morning. Equate brand allergy Rx  . OVER THE COUNTER MEDICATION Take 1 tablet by mouth daily as needed (acid reducer). OTC Equate brand Acid Reducer  . sertraline (ZOLOFT) 50 MG tablet TAKE ONE TABLET BY MOUTH  DAILY  . SYNTHROID 150 MCG tablet Take 1 tablet (150 mcg total) by mouth daily before breakfast.  . VOLTAREN 1 % GEL Apply 2 g topically 4 (four) times daily.   No current facility-administered medications on file prior to visit.     ROS: Review of Systems  Constitutional: Negative for malaise/fatigue and weight loss.  HENT: Negative for hearing loss and tinnitus.   Eyes: Negative for blurred vision and double vision.  Respiratory: Negative for cough, shortness of breath and wheezing.   Cardiovascular: Negative for chest pain, palpitations, orthopnea, claudication and leg swelling.  Gastrointestinal: Negative for abdominal pain, blood in stool, constipation, diarrhea, heartburn, melena, nausea and vomiting.  Genitourinary: Negative.   Musculoskeletal: Negative for joint pain and myalgias.  Skin: Negative for rash.  Neurological: Negative for dizziness, tingling, sensory change, weakness and headaches.  Endo/Heme/Allergies: Negative for polydipsia.  Psychiatric/Behavioral: Negative.   All other systems reviewed and are negative.    Physical Exam:  BP 124/82   Pulse (!) 102   Temp 98.1 F (36.7 C)   Ht 5\' 5"  (1.651 m)   Wt 233 lb (105.7 kg)   SpO2 95%   BMI 38.77 kg/m   General Appearance: Well nourished, in no apparent distress. Eyes: PERRLA, EOMs, conjunctiva no swelling or erythema Sinuses: No Frontal/maxillary  tenderness ENT/Mouth: Ext aud canals clear, TMs without erythema, bulging. No erythema, swelling, or exudate on post pharynx.  Tonsils not swollen or erythematous. Hearing normal.  Neck: Supple, thyroid normal.  Respiratory: Respiratory effort normal, BS equal bilaterally without rales, rhonchi, wheezing or stridor.  Cardio: RRR with no MRGs. Brisk peripheral pulses without edema.  Abdomen: Soft, + BS.  Non tender, no guarding, rebound, hernias, masses. Lymphatics: Non tender without lymphadenopathy.  Musculoskeletal: Full ROM, 5/5 strength, normal gait.  Skin: Warm, dry without rashes, lesions, ecchymosis.  Neuro: Cranial nerves intact. Normal muscle tone, no cerebellar symptoms. Sensation intact.  Psych: Awake and oriented X 3, normal affect, Insight and Judgment appropriate.     Izora Ribas, NP 3:56 PM [facility]

## 2017-07-01 ENCOUNTER — Encounter: Payer: Self-pay | Admitting: Adult Health

## 2017-07-01 ENCOUNTER — Ambulatory Visit: Payer: 59 | Admitting: Adult Health

## 2017-07-01 VITALS — BP 124/82 | HR 102 | Temp 98.1°F | Ht 65.0 in | Wt 233.0 lb

## 2017-07-01 DIAGNOSIS — K648 Other hemorrhoids: Secondary | ICD-10-CM

## 2017-07-01 DIAGNOSIS — I10 Essential (primary) hypertension: Secondary | ICD-10-CM | POA: Diagnosis not present

## 2017-07-01 MED ORDER — HYDROCORTISONE ACETATE 25 MG RE SUPP: 25.0000 mg | Freq: Two times a day (BID) | RECTAL | 1 refills | Status: DC | PRN

## 2017-07-01 NOTE — Patient Instructions (Signed)
Keep checking BP - call if it is above 150/90 several days in a row - aiming for 120s/80s on average.   Go to the ER if any chest pain, shortness of breath, nausea, dizziness, severe HA, changes vision/speech    Hemorrhoids Hemorrhoids are swollen veins in and around the rectum or anus. There are two types of hemorrhoids:  Internal hemorrhoids. These occur in the veins that are just inside the rectum. They may poke through to the outside and become irritated and painful.  External hemorrhoids. These occur in the veins that are outside of the anus and can be felt as a painful swelling or hard lump near the anus.  Most hemorrhoids do not cause serious problems, and they can be managed with home treatments such as diet and lifestyle changes. If home treatments do not help your symptoms, procedures can be done to shrink or remove the hemorrhoids. What are the causes? This condition is caused by increased pressure in the anal area. This pressure may result from various things, including:  Constipation.  Straining to have a bowel movement.  Diarrhea.  Pregnancy.  Obesity.  Sitting for long periods of time.  Heavy lifting or other activity that causes you to strain.  Anal sex.  What are the signs or symptoms? Symptoms of this condition include:  Pain.  Anal itching or irritation.  Rectal bleeding.  Leakage of stool (feces).  Anal swelling.  One or more lumps around the anus.  How is this diagnosed? This condition can often be diagnosed through a visual exam. Other exams or tests may also be done, such as:  Examination of the rectal area with a gloved hand (digital rectal exam).  Examination of the anal canal using a small tube (anoscope).  A blood test, if you have lost a significant amount of blood.  A test to look inside the colon (sigmoidoscopy or colonoscopy).  How is this treated? This condition can usually be treated at home. However, various procedures  may be done if dietary changes, lifestyle changes, and other home treatments do not help your symptoms. These procedures can help make the hemorrhoids smaller or remove them completely. Some of these procedures involve surgery, and others do not. Common procedures include:  Rubber band ligation. Rubber bands are placed at the base of the hemorrhoids to cut off the blood supply to them.  Sclerotherapy. Medicine is injected into the hemorrhoids to shrink them.  Infrared coagulation. A type of light energy is used to get rid of the hemorrhoids.  Hemorrhoidectomy surgery. The hemorrhoids are surgically removed, and the veins that supply them are tied off.  Stapled hemorrhoidopexy surgery. A circular stapling device is used to remove the hemorrhoids and use staples to cut off the blood supply to them.  Follow these instructions at home: Eating and drinking  Eat foods that have a lot of fiber in them, such as whole grains, beans, nuts, fruits, and vegetables. Ask your health care provider about taking products that have added fiber (fiber supplements).  Drink enough fluid to keep your urine clear or pale yellow. Managing pain and swelling  Take warm sitz baths for 20 minutes, 3-4 times a day to ease pain and discomfort.  If directed, apply ice to the affected area. Using ice packs between sitz baths may be helpful. ? Put ice in a plastic bag. ? Place a towel between your skin and the bag. ? Leave the ice on for 20 minutes, 2-3 times a day. General instructions  Take over-the-counter and prescription medicines only as told by your health care provider.  Use medicated creams or suppositories as told.  Exercise regularly.  Go to the bathroom when you have the urge to have a bowel movement. Do not wait.  Avoid straining to have bowel movements.  Keep the anal area dry and clean. Use wet toilet paper or moist towelettes after a bowel movement.  Do not sit on the toilet for long periods  of time. This increases blood pooling and pain. Contact a health care provider if:  You have increasing pain and swelling that are not controlled by treatment or medicine.  You have uncontrolled bleeding.  You have difficulty having a bowel movement, or you are unable to have a bowel movement.  You have pain or inflammation outside the area of the hemorrhoids. This information is not intended to replace advice given to you by your health care provider. Make sure you discuss any questions you have with your health care provider. Document Released: 07/27/2000 Document Revised: 12/28/2015 Document Reviewed: 04/13/2015 Elsevier Interactive Patient Education  2017 [employer].

## 2017-07-13 DIAGNOSIS — M25562 Pain in left knee: Secondary | ICD-10-CM | POA: Diagnosis not present

## 2017-07-16 DIAGNOSIS — M25562 Pain in left knee: Secondary | ICD-10-CM | POA: Diagnosis not present

## 2017-07-16 DIAGNOSIS — Z79899 Other long term (current) drug therapy: Secondary | ICD-10-CM | POA: Diagnosis not present

## 2017-07-16 DIAGNOSIS — M129 Arthropathy, unspecified: Secondary | ICD-10-CM | POA: Diagnosis not present

## 2017-07-23 ENCOUNTER — Encounter: Payer: Self-pay | Admitting: Physician Assistant

## 2017-07-23 DIAGNOSIS — M25562 Pain in left knee: Secondary | ICD-10-CM | POA: Diagnosis not present

## 2017-07-23 NOTE — Progress Notes (Signed)
Complete Physical  Assessment and Plan:  Essential hypertension - continue medications, DASH diet, exercise and monitor at home. Call if greater than 130/80.  -     CBC with Differential/Platelet -     BASIC METABOLIC PANEL WITH GFR -     TSH -     Hepatic function panel -     Urinalysis, Routine w reflex microscopic -     Microalbumin / creatinine urine ratio -     EKG 12-Lead  SVT (supraventricular tachycardia) (HCC) ? Need BB, increase water, monitor, GET SLEEP STUDY -     EKG 12-Lead  PSVT (paroxysmal supraventricular tachycardia) (HCC) Get sleep study -     EKG 12-Lead  Thyroid disease Hypothyroidism-check TSH level, continue medications the same, reminded to take on an empty stomach 30-37mins before food.  -     TSH  Mixed hyperlipidemia -continue medications, check lipids, decrease fatty foods, increase activity.  -     Lipid panel  Tobacco use disorder -     varenicline (CHANTIX CONTINUING MONTH PAK) 1 MG tablet; Take 1 tablet (1 mg total) by mouth 2 (two) times daily.  Vitamin D deficiency -     VITAMIN D 25 Hydroxy (Vit-D Deficiency, Fractures)  BMI 37.0-37.9, adult - follow up 3 months for progress monitoring - increase veggies, decrease carbs - long discussion about weight loss, diet, and exercise  Need for diphtheria-tetanus-pertussis (Tdap) vaccine -     Tdap vaccine greater than or equal to 7yo IM  Rheumatoid arthritis, involving unspecified site, unspecified rheumatoid factor presence ([place]) Continue follow up  Chronic obstructive pulmonary disease, unspecified COPD type ([place]) Will try chantix, monitor for symptoms  Class 2 severe obesity due to excess calories with serious comorbidity and body mass index (BMI) of 37.0 to 37.9 in adult [facility]) - follow up 3 months for progress monitoring - increase veggies, decrease carbs - long discussion about weight loss, diet, and exercise  Internal hemorrhoids -     Ambulatory referral to  Gastroenterology  Screen for colon cancer -     Ambulatory referral to Gastroenterology  Abnormal glucose Discussed disease progression and risks Discussed diet/exercise, weight management and risk modification -     Hemoglobin A1c  Medication management -     Magnesium  Anemia, unspecified type -     Iron,Total/Total Iron Binding Cap -     Vitamin B12   Discussed med's effects and SE's. Screening labs and tests as requested with regular follow-up as recommended. Over 40 minutes of exam, counseling, chart review, and complex, high level critical decision making was performed this visit.   Future Appointments  Date Time Provider [place]  07/30/2018 10:00 AM Vicie Mutters, PA-C [name] None     HPI  56 y.o. female  presents for a complete physical and follow up for has Hypertension; Hyperlipidemia; Thyroid disease; Vitamin D deficiency; Obesity; Palpitations; Tobacco use disorder; PSVT (paroxysmal supraventricular tachycardia) (HCC); SVT (supraventricular tachycardia) ([place]); and Rheumatoid arthritis ([place]) on their problem list..  Her blood pressure has been controlled at home, today their BP is BP: 126/82 She does not workout. She denies chest pain, shortness of breath, dizziness.   Has flare of her hemorrhoids, not better, on miralax and linzess, will have pencil thin stool. Last colonoscopy was 20+ years ago.   She has history of RA, follows with Dr. Amil Amen, on [place].  She continues to smoke, has COPD, and has followed up with Dr. Brett Fairy but never had sleep study  due to RA flare, have instructed her to call to follow up for this, very important.   She is not on cholesterol medication and denies myalgias. Her cholesterol is at goal. The cholesterol last visit was:   Lab Results  Component Value Date   CHOL 167 01/28/2017   HDL 38 (L) 01/28/2017   LDLCALC 74 01/28/2017   TRIG 275 (H) 01/28/2017   CHOLHDL 4.4 01/28/2017    She has been working on diet  and exercise for prediabetes,  and denies paresthesia of the feet, polydipsia, polyuria and visual disturbances. Last A1C in the office was:  Lab Results  Component Value Date   HGBA1C 5.8 (H) 01/28/2017   Last GFR: Lab Results  Component Value Date   GFRNONAA 81 01/28/2017   Patient is on Vitamin D supplement.   Lab Results  Component Value Date   VD25OH 23 (L) 03/15/2015     She is on thyroid medication. Her medication was changed last visit, she is on 150 every day.   Lab Results  Component Value Date   TSH 0.04 (L) 03/08/2017  .  BMI is Body mass index is 37.64 kg/m., she is working on diet and exercise. Wt Readings from Last 3 Encounters:  07/24/17 233 lb 3.2 oz (105.8 kg)  07/01/17 233 lb (105.7 kg)  06/03/17 242 lb 3.2 oz (109.9 kg)    Current Medications:  Current Outpatient Medications on File Prior to Visit  Medication Sig Dispense Refill  . Acetaminophen (TYLENOL ARTHRITIS EXT RELIEF PO) Take by mouth 2 (two) times daily as needed.     . fluticasone (FLONASE) 50 MCG/ACT nasal spray Place 1 spray into both nostrils daily.    . hydrocortisone (ANUSOL-HC) 25 MG suppository Place 1 suppository (25 mg total) 2 (two) times daily as needed rectally for hemorrhoids. 30 suppository 1  . leflunomide (ARAVA) 20 MG tablet Take 20 mg by mouth daily.    Marland Kitchen linaclotide (LINZESS) 72 MCG capsule Take 1 capsule (72 mcg total) by mouth daily before breakfast. 90 capsule 1  . lisinopril-hydrochlorothiazide (ZESTORETIC) 20-12.5 MG tablet Take 1 tablet daily for BP 90 tablet 1  . metroNIDAZOLE (METROGEL) 0.75 % gel     . Multiple Vitamins-Minerals (EMERGEN-C FIVE PO) Take 1 Package by mouth daily.    Marland Kitchen OVER THE COUNTER MEDICATION Take 1 tablet by mouth every morning. Equate brand allergy Rx    . OVER THE COUNTER MEDICATION Take 1 tablet by mouth daily as needed (acid reducer). OTC Equate brand Acid Reducer    . sertraline (ZOLOFT) 50 MG tablet TAKE ONE TABLET BY MOUTH  DAILY 90 tablet 1   . SYNTHROID 150 MCG tablet Take 1 tablet (150 mcg total) by mouth daily before breakfast. 60 tablet 1  . VOLTAREN 1 % GEL Apply 2 g topically 4 (four) times daily. 4 Tube 2   No current facility-administered medications on file prior to visit.    Allergies:  Allergies  Allergen Reactions  . Darvocet [Propoxyphene N-Acetaminophen] Anaphylaxis  . Darvon [Propoxyphene Hcl] Anaphylaxis and Other (See Comments)    Airway swelling   . Propoxyphene Anaphylaxis    Throat closes up  . Oseltamivir Hives    Hives, elevated BP  . Tamiflu [Oseltamivir Phosphate] Other (See Comments)    Increased blood pressure Hives    Medical History:  She has Hypertension; Hyperlipidemia; Thyroid disease; Vitamin D deficiency; Obesity; Palpitations; Tobacco use disorder; PSVT (paroxysmal supraventricular tachycardia) (HCC); SVT (supraventricular tachycardia) ([place]); and Rheumatoid arthritis ([place])  on their problem list.   Health Maintenance:   Immunization History  Administered Date(s) Administered  . Influenza-Unspecified 05/14/2015, 05/27/2017  . PPD Test 12/02/2013  . Pneumococcal Polysaccharide-23 04/08/2012  . Td 10/16/2004  . Tdap 07/24/2017   Tetanus: 2018 Pneumovax: 2013 Prevnar 13:  Flu vaccine: 2018 Zostavax:  No LMP recorded. Patient has had an ablation. Pap: 2006  MGM:03/06/2017  DEXA: N/A Colonoscopy: has not had yet, having hemorrhoids, has seen [facility] in the past and would like to see someone different.  EGD: 2015 CXR 2015 Echo 10/01/2013 Stress test 01/19/2015  Patient Care Team: Unk Pinto, MD as PCP - General (Internal Medicine) Jari Pigg, MD as Consulting Physician (Dermatology) Elsie Saas, MD as Consulting Physician (Orthopedic Surgery) Kathie Rhodes, MD as Consulting Physician (Urology) Clarene Essex, MD as Consulting Physician (Gastroenterology) Gaynelle Arabian, MD as Consulting Physician (Orthopedic Surgery)  Surgical History:  She has a past surgical  history that includes Carpal tunnel release (Right, 1990); Tubal ligation (1987); Knee arthroscopy (Right, 2013); Bladder repair (1993); Endometrial ablation (2009); Excision basal cell carcinoma (2014); Partial knee arthroplasty (Right, 02/15/2014); Cardiac catheterization (N/A, 04/08/2015); Joint replacement; Incontinence surgery (2010); Knee arthroscopy; and Cardiac surgery. Family History:  Herfamily history includes Atrial fibrillation in her mother; Breast cancer in her paternal aunt; COPD in her father; Diabetes in her father; Emphysema in her father; Heart attack in her maternal grandmother; Heart disease in her maternal grandfather, paternal grandfather, and paternal grandmother; Hyperlipidemia in her maternal grandfather, paternal grandfather, and paternal grandmother; Hypertension in her mother; Stroke in her maternal grandmother. Social History:  She reports that she has been smoking cigarettes.  She has a 60.00 pack-year smoking history. she has never used smokeless tobacco. She reports that she does not drink alcohol or use drugs.  Review of Systems: Review of Systems  Constitutional: Positive for malaise/fatigue. Negative for chills, diaphoresis, fever and weight loss.  HENT: Negative.   Eyes: Negative.   Respiratory: Negative.   Cardiovascular: Negative.   Gastrointestinal: Negative.   Genitourinary: Negative.   Musculoskeletal: Positive for neck pain. Negative for back pain, falls, joint pain and myalgias.  Skin: Negative.   Neurological: Positive for tingling and sensory change (bilateral arms from neck). Negative for dizziness, tremors, speech change, focal weakness, seizures, loss of consciousness, weakness and headaches.  Psychiatric/Behavioral: Positive for depression. Negative for hallucinations, memory loss, substance abuse and suicidal ideas. The patient has insomnia. The patient is not nervous/anxious.     Physical Exam: Estimated body mass index is 37.64 kg/m as  calculated from the following:   Height as of this encounter: 5\' 6"  (1.676 m).   Weight as of this encounter: 233 lb 3.2 oz (105.8 kg). BP 126/82   Pulse (!) 104   Temp (!) 97.3 F (36.3 C)   Resp 14   Ht 5\' 6"  (1.676 m)   Wt 233 lb 3.2 oz (105.8 kg)   SpO2 96%   BMI 37.64 kg/m  General Appearance: Well nourished, in no apparent distress.  Eyes: PERRLA, EOMs, conjunctiva no swelling or erythema, normal fundi and vessels.  Sinuses: No Frontal/maxillary tenderness  ENT/Mouth: Ext aud canals clear, normal light reflex with TMs without erythema, bulging. Good dentition. No erythema, swelling, or exudate on post pharynx. Tonsils not swollen or erythematous. Hearing normal.  Neck: Supple, thyroid normal. No bruits  Respiratory: Respiratory effort normal, BS equal bilaterally without rales, rhonchi, wheezing or stridor.  Cardio: RRR without murmurs, rubs or gallops. Brisk peripheral pulses without edema.  Chest: symmetric,  with normal excursions and percussion.  Breasts: Symmetric, without lumps, nipple discharge, retractions.  Abdomen: Soft, nontender, no guarding, rebound, hernias, masses, or organomegaly.  Lymphatics: Non tender without lymphadenopathy.  Genitourinary: defer Musculoskeletal: Full ROM all peripheral extremities,5/5 strength, and normal gait.  Skin: Warm, dry without rashes, lesions, ecchymosis. Neuro: Cranial nerves intact, reflexes equal bilaterally. Normal muscle tone, no cerebellar symptoms. Sensation intact.  Psych: Awake and oriented X 3, normal affect, Insight and Judgment appropriate.   EKG: WNL no ST changes. AORTA SCAN: defer  Vicie Mutters 10:18 AM [facility]

## 2017-07-24 ENCOUNTER — Ambulatory Visit (INDEPENDENT_AMBULATORY_CARE_PROVIDER_SITE_OTHER): Payer: 59 | Admitting: Physician Assistant

## 2017-07-24 ENCOUNTER — Encounter: Payer: Self-pay | Admitting: Physician Assistant

## 2017-07-24 VITALS — BP 126/82 | HR 104 | Temp 97.3°F | Resp 14 | Ht 66.0 in | Wt 233.2 lb

## 2017-07-24 DIAGNOSIS — M069 Rheumatoid arthritis, unspecified: Secondary | ICD-10-CM

## 2017-07-24 DIAGNOSIS — E782 Mixed hyperlipidemia: Secondary | ICD-10-CM

## 2017-07-24 DIAGNOSIS — I471 Supraventricular tachycardia, unspecified: Secondary | ICD-10-CM

## 2017-07-24 DIAGNOSIS — Z79899 Other long term (current) drug therapy: Secondary | ICD-10-CM

## 2017-07-24 DIAGNOSIS — R7309 Other abnormal glucose: Secondary | ICD-10-CM

## 2017-07-24 DIAGNOSIS — F17200 Nicotine dependence, unspecified, uncomplicated: Secondary | ICD-10-CM

## 2017-07-24 DIAGNOSIS — E079 Disorder of thyroid, unspecified: Secondary | ICD-10-CM

## 2017-07-24 DIAGNOSIS — Z23 Encounter for immunization: Secondary | ICD-10-CM

## 2017-07-24 DIAGNOSIS — I10 Essential (primary) hypertension: Secondary | ICD-10-CM

## 2017-07-24 DIAGNOSIS — Z136 Encounter for screening for cardiovascular disorders: Secondary | ICD-10-CM

## 2017-07-24 DIAGNOSIS — Z0000 Encounter for general adult medical examination without abnormal findings: Secondary | ICD-10-CM | POA: Diagnosis not present

## 2017-07-24 DIAGNOSIS — Z6837 Body mass index (BMI) 37.0-37.9, adult: Secondary | ICD-10-CM

## 2017-07-24 DIAGNOSIS — E559 Vitamin D deficiency, unspecified: Secondary | ICD-10-CM

## 2017-07-24 DIAGNOSIS — K648 Other hemorrhoids: Secondary | ICD-10-CM

## 2017-07-24 DIAGNOSIS — D649 Anemia, unspecified: Secondary | ICD-10-CM

## 2017-07-24 DIAGNOSIS — Z1211 Encounter for screening for malignant neoplasm of colon: Secondary | ICD-10-CM

## 2017-07-24 DIAGNOSIS — E66812 Obesity, class 2: Secondary | ICD-10-CM

## 2017-07-24 DIAGNOSIS — J449 Chronic obstructive pulmonary disease, unspecified: Secondary | ICD-10-CM

## 2017-07-24 MED ORDER — VARENICLINE TARTRATE 1 MG PO TABS: 1.0000 mg | ORAL_TABLET | Freq: Two times a day (BID) | ORAL | 2 refills | Status: DC

## 2017-07-24 NOTE — Patient Instructions (Addendum)
You can take tylenol (500mg ) or tylenol arthritis (650mg ) with the meloxicam/antiinflammatories. The max you can take of tylenol a day is 3000mg  daily, this is a max of 6 pills a day of the regular tyelnol (500mg ) or a max of 4 a day of the tylenol arthritis (650mg ) as long as no other medications you are taking contain tylenol.   Please call Dr. Brett Fairy Phone: [phone];  VENOUS INSUFFICIENCY Our lower leg venous system is not the most reliable, the heart does NOT pump fluid up, there is a valve system.  The muscles of the leg squeeze and the blood moves up and a valve opens and close, then they squeeze, blood moves up and valves open and closes keeping the blood moving towards the heart.  Lots can go wrong with this valve system.  If someone is sitting or standing without movement, everyone will get swelling.  THINGS TO DO:  Do not stand or sit in one position for long periods of time. Do not sit with your legs crossed. Rest with your legs raised during the day.  Your legs have to be higher than your heart so that gravity will force the valves to open, so please really elevate your legs.   Wear elastic stockings or support hose. Do not wear other tight, encircling garments around the legs, pelvis, or waist.  ELAST[employer]ER[employer]  has a wide variety of well priced compression stockings. [place], [place] [address] [address] has a good cheap selection, I like the socks, they are not as hard to get on  Walk as much as possible to increase blood flow.  Raise the foot of your bed at night with 2-inch blocks.  SEEK MEDICAL CARE IF:   The skin around your ankle starts to break down.  You have pain, redness, tenderness, or hard swelling developing in your leg over a vein.  You are uncomfortable due to leg pain.  If you ever have shortness of breath with exertion or chest pain go to the ER.  If you have a smart phone, please look up Smoke Free app, this will  help you stay on track and give you information about money you have saved, life that you have gained back and a ton of more information.   We are giving you chantix for smoking cessation. You can do it! And we are here to help! You may have heard some scary side effects about chantix, the three most common I hear about are nausea, crazy dreams and depression.  However, I like for my patients to try to stay on 1/2 a tablet twice a day rather than one tablet twice a day as normally prescribed. This helps decrease the chances of side effects and helps save money by making a one month prescription last two months  Please start the prescription this way:  Start 1/2 tablet by mouth once daily after food with a full glass of water for 3 days Then do 1/2 tablet by mouth twice daily for 4 days. During this first week you can smoke, but try to stop after this week.  At this point we have several options: 1) continue on 1/2 tablet twice a day- which I encourage you to do. You can stay on this dose the rest of the time on the medication or if you still feel the need to smoke you can do one of the two options below. 2) do one tablet in the morning and  1/2 in the evening which helps decrease dreams. 3) do one tablet twice a day.   What if I miss a dose? If you miss a dose, take it as soon as you can. If it is almost time for your next dose, take only that dose. Do not take double or extra doses.  What should I watch for while using this medicine? Visit your doctor or health care professional for regular check ups. Ask for ongoing advice and encouragement from your doctor or healthcare professional, friends, and family to help you quit. If you smoke while on this medication, quit again  Your mouth may get dry. Chewing sugarless gum or hard candy, and drinking plenty of water may help. Contact your doctor if the problem does not go away or is severe.  You may get drowsy or dizzy. Do not drive, use machinery,  or do anything that needs mental alertness until you know how this medicine affects you. Do not stand or sit up quickly, especially if you are an older patient.   The use of this medicine may increase the chance of suicidal thoughts or actions. Pay special attention to how you are responding while on this medicine. Any worsening of mood, or thoughts of suicide or dying should be reported to your health care professional right away.  ADVANTAGES OF QUITTING SMOKING  Within 20 minutes, blood pressure decreases. Your pulse is at normal level.  After 8 hours, carbon monoxide levels in the blood return to normal. Your oxygen level increases.  After 24 hours, the chance of having a heart attack starts to decrease. Your breath, hair, and body stop smelling like smoke.  After 48 hours, damaged nerve endings begin to recover. Your sense of taste and smell improve.  After 72 hours, the body is virtually free of nicotine. Your bronchial tubes relax and breathing becomes easier.  After 2 to 12 weeks, lungs can hold more air. Exercise becomes easier and circulation improves.  After 1 year, the risk of coronary heart disease is cut in half.  After 5 years, the risk of stroke falls to the same as a nonsmoker.  After 10 years, the risk of lung cancer is cut in half and the risk of other cancers decreases significantly.  After 15 years, the risk of coronary heart disease drops, usually to the level of a nonsmoker.  You will have extra money to spend on things other than cigarettes.  Being dehydrated can hurt your kidneys, cause fatigue, headaches, muscle aches, joint pain, and dry skin/nails so please increase your fluids.   Drink 80-100 oz a day of water, measure it out! Eat 3 meals a day, have to do breakfast, eat protein- hard boiled eggs, protein bar like nature [place] protein bar, greek yogurt like oikos triple zero, chobani 100, or light n fit greek  Can check out plantnanny app on your phone to  help you keep track of your water   We want weight loss that will last so you should lose 1-2 pounds a week.  THAT IS IT! Please pick THREE things a month to change. Once it is a habit check off the item. Then pick another three items off the list to become habits.  If you are already doing a habit on the list GREAT!  Cross that item off! o Don't drink your calories. Ie, alcohol, soda, fruit juice, and sweet tea.  o Drink more water. Drink a glass when you feel hungry or before each meal.  o Eat breakfast - Complex carb and protein (likeDannon light and fit yogurt, oatmeal, fruit, eggs, [place] bacon). o Measure your cereal.  Eat no more than one cup a day. (ie [place]) o Eat an apple a day. o Add a vegetable a day. o Try a new vegetable a month. o Use Pam! Stop using oil or butter to [place]. o Don't finish your plate or use smaller plates. o Share your dessert. o Eat sugar free Jello for dessert or frozen grapes. o Don't eat 2-3 hours before bed. o Switch to whole wheat bread, pasta, and brown rice. o Make healthier choices when you eat out. No fries! o Pick baked chicken, NOT fried. o Don't forget to SLOW DOWN when you eat. It is not going anywhere.  o Take the stairs. o Park far away in the parking lot o [employer] (or weights) for 10 minutes while watching TV. o Walk at work for 10 minutes during break. o Walk outside 1 time a week with your friend, kids, dog, or significant other. o Start a walking group at [place] the mall as much as you can tolerate.  o Keep a food diary. o Weigh yourself daily. o Walk for 15 minutes 3 days per week. o [place] at home more often and eat out less.  If life happens and you go back to old habits, it is okay.  Just start over. You can do it!   If you experience chest pain, get short of breath, or tired during the exercise, please stop immediately and inform your doctor.

## 2017-07-25 ENCOUNTER — Other Ambulatory Visit: Payer: Self-pay | Admitting: Physician Assistant

## 2017-07-25 ENCOUNTER — Encounter: Payer: Self-pay | Admitting: Gastroenterology

## 2017-07-25 DIAGNOSIS — E039 Hypothyroidism, unspecified: Secondary | ICD-10-CM

## 2017-07-25 DIAGNOSIS — E538 Deficiency of other specified B group vitamins: Secondary | ICD-10-CM

## 2017-07-25 DIAGNOSIS — N289 Disorder of kidney and ureter, unspecified: Secondary | ICD-10-CM

## 2017-07-25 LAB — HEPATIC FUNCTION PANEL
AG Ratio: 1.6 (calc) (ref 1.0–2.5)
ALBUMIN MSPROF: 4.1 g/dL (ref 3.6–5.1)
ALT: 23 U/L (ref 6–29)
AST: 14 U/L (ref 10–35)
Alkaline phosphatase (APISO): 73 U/L (ref 33–130)
Bilirubin, Direct: 0.1 mg/dL (ref 0.0–0.2)
GLOBULIN: 2.6 g/dL (ref 1.9–3.7)
Indirect Bilirubin: 0.3 mg/dL (calc) (ref 0.2–1.2)
TOTAL PROTEIN: 6.7 g/dL (ref 6.1–8.1)
Total Bilirubin: 0.4 mg/dL (ref 0.2–1.2)

## 2017-07-25 LAB — LIPID PANEL
CHOL/HDL RATIO: 4.5 (calc) (ref <5.0)
Cholesterol: 228 mg/dL — ABNORMAL HIGH (ref <200)
HDL: 51 mg/dL (ref >50)
LDL Cholesterol (Calc): 146 mg/dL (calc) — ABNORMAL HIGH
NON-HDL CHOLESTEROL (CALC): 177 mg/dL — AB (ref <130)
Triglycerides: 170 mg/dL — ABNORMAL HIGH (ref <150)

## 2017-07-25 LAB — CBC WITH DIFFERENTIAL/PLATELET
BASOS PCT: 0.1 %
Basophils Absolute: 9 cells/uL (ref 0–200)
Eosinophils Absolute: 0 cells/uL — ABNORMAL LOW (ref 15–500)
Eosinophils Relative: 0 %
HEMATOCRIT: 34.8 % — AB (ref 35.0–45.0)
Hemoglobin: 12 g/dL (ref 11.7–15.5)
LYMPHS ABS: 765 {cells}/uL — AB (ref 850–3900)
MCH: 32.3 pg (ref 27.0–33.0)
MCHC: 34.5 g/dL (ref 32.0–36.0)
MCV: 93.8 fL (ref 80.0–100.0)
MPV: 11.2 fL (ref 7.5–12.5)
Monocytes Relative: 7.5 %
Neutro Abs: 7089 cells/uL (ref 1500–7800)
Neutrophils Relative %: 83.4 %
PLATELETS: 252 10*3/uL (ref 140–400)
RBC: 3.71 10*6/uL — AB (ref 3.80–5.10)
RDW: 12.2 % (ref 11.0–15.0)
TOTAL LYMPHOCYTE: 9 %
WBC: 8.5 10*3/uL (ref 3.8–10.8)
WBCMIX: 638 {cells}/uL (ref 200–950)

## 2017-07-25 LAB — BASIC METABOLIC PANEL WITH GFR
BUN / CREAT RATIO: 17 (calc) (ref 6–22)
BUN: 19 mg/dL (ref 7–25)
CALCIUM: 9.9 mg/dL (ref 8.6–10.4)
CHLORIDE: 100 mmol/L (ref 98–110)
CO2: 25 mmol/L (ref 20–32)
Creat: 1.1 mg/dL — ABNORMAL HIGH (ref 0.50–1.05)
GFR, EST AFRICAN AMERICAN: 66 mL/min/{1.73_m2} (ref >=60)
GFR, Est Non African American: 57 mL/min/{1.73_m2} — ABNORMAL LOW (ref >=60)
Glucose, Bld: 127 mg/dL — ABNORMAL HIGH (ref 65–99)
POTASSIUM: 4.7 mmol/L (ref 3.5–5.3)
Sodium: 136 mmol/L (ref 135–146)

## 2017-07-25 LAB — VITAMIN B12: VITAMIN B 12: 277 pg/mL (ref 200–1100)

## 2017-07-25 LAB — URINALYSIS, ROUTINE W REFLEX MICROSCOPIC
Bilirubin Urine: NEGATIVE
GLUCOSE, UA: NEGATIVE
HGB URINE DIPSTICK: NEGATIVE
KETONES UR: NEGATIVE
LEUKOCYTES UA: NEGATIVE
NITRITE: NEGATIVE
Protein, ur: NEGATIVE
SPECIFIC GRAVITY, URINE: 1.016 (ref 1.001–1.03)

## 2017-07-25 LAB — IRON, TOTAL/TOTAL IRON BINDING CAP
%SAT: 39 % (ref 11–50)
IRON: 139 ug/dL (ref 45–160)
TIBC: 357 ug/dL (ref 250–450)

## 2017-07-25 LAB — HEMOGLOBIN A1C
EAG (MMOL/L): 7.3 (calc)
Hgb A1c MFr Bld: 6.2 % of total Hgb — ABNORMAL HIGH (ref <5.7)
MEAN PLASMA GLUCOSE: 131 (calc)

## 2017-07-25 LAB — VITAMIN D 25 HYDROXY (VIT D DEFICIENCY, FRACTURES): Vit D, 25-Hydroxy: 16 ng/mL — ABNORMAL LOW (ref 30–100)

## 2017-07-25 LAB — MAGNESIUM: Magnesium: 2 mg/dL (ref 1.5–2.5)

## 2017-07-25 LAB — MICROALBUMIN / CREATININE URINE RATIO
Creatinine, Urine: 180 mg/dL (ref 20–275)
MICROALB UR: 1.8 mg/dL
MICROALB/CREAT RATIO: 10 ug/mg{creat} (ref <30)

## 2017-07-25 LAB — TSH: TSH: 0.05 mIU/L — ABNORMAL LOW

## 2017-07-27 ENCOUNTER — Other Ambulatory Visit: Payer: Self-pay

## 2017-07-27 ENCOUNTER — Encounter (HOSPITAL_COMMUNITY): Payer: Self-pay

## 2017-07-27 ENCOUNTER — Emergency Department (HOSPITAL_COMMUNITY)
Admission: EM | Admit: 2017-07-27 | Discharge: 2017-07-27 | Disposition: A | Discharge status: Home / Self Care | Payer: 59 | Attending: Emergency Medicine | Admitting: Emergency Medicine

## 2017-07-27 DIAGNOSIS — Z96651 Presence of right artificial knee joint: Secondary | ICD-10-CM | POA: Insufficient documentation

## 2017-07-27 DIAGNOSIS — Z79899 Other long term (current) drug therapy: Secondary | ICD-10-CM | POA: Insufficient documentation

## 2017-07-27 DIAGNOSIS — F17210 Nicotine dependence, cigarettes, uncomplicated: Secondary | ICD-10-CM | POA: Diagnosis not present

## 2017-07-27 DIAGNOSIS — J449 Chronic obstructive pulmonary disease, unspecified: Secondary | ICD-10-CM | POA: Diagnosis not present

## 2017-07-27 DIAGNOSIS — I10 Essential (primary) hypertension: Secondary | ICD-10-CM | POA: Insufficient documentation

## 2017-07-27 DIAGNOSIS — K644 Residual hemorrhoidal skin tags: Secondary | ICD-10-CM | POA: Insufficient documentation

## 2017-07-27 DIAGNOSIS — K649 Unspecified hemorrhoids: Secondary | ICD-10-CM | POA: Diagnosis present

## 2017-07-27 DIAGNOSIS — E039 Hypothyroidism, unspecified: Secondary | ICD-10-CM | POA: Insufficient documentation

## 2017-07-27 DIAGNOSIS — Z85828 Personal history of other malignant neoplasm of skin: Secondary | ICD-10-CM | POA: Diagnosis not present

## 2017-07-27 MED ORDER — HYDROCORTISONE ACE-PRAMOXINE 1-1 % RE FOAM: 1.0000 | Freq: Two times a day (BID) | RECTAL | 0 refills | Status: DC

## 2017-07-27 NOTE — ED Provider Notes (Signed)
[facility] EMERGENCY DEPARTMENT Provider Note   CSN: [id number] Arrival date & time: 07/27/17  7106     History   Chief Complaint No chief complaint on file.   HPI Sherry Dennis is a 56 y.o. female.  56 year old female presents with 1 month history of internal as well as external hemorrhoids.  Has had intermittent bleeding which she says relieves her symptoms.  Denies any weakness or syncope or near syncope.  Does take stool softeners at this time.  No abdominal discomfort.  Has been using over-the-counter medications without relief.      Past Medical History:  Diagnosis Date  . Arthritis    "knees, hands, ankles, ~ every joint" (04/08/2015)  . Basal cell carcinoma    "face, hands, chest" (04/08/2015)  . Chronic bronchitis ([place])    "get it pretty much q yr" (04/08/2015)  . Dysrhythmia    palpitations occ; SVT 09/2013 s/p adenosine  . GERD (gastroesophageal reflux disease)   . H/O hiatal hernia   . Hx of cardiovascular stress test    Lexiscan Myoview 6/16: Ejection fraction is 60% and wall motion is normal. The study is normal. There is no scar or ischemia. This is a low risk scan.  . Hyperlipidemia   . Hypertension   . Hypothyroidism   . Palpitations   . Pneumonia 2015 X 1  . PONV (postoperative nausea and vomiting)   . S/P total knee arthroplasty 02/15/2014  . Thyroid disease   . Vitamin D deficiency     Patient Active Problem List   Diagnosis Date Noted  . Rheumatoid arthritis ([place]) 07/24/2017  . COPD (chronic obstructive pulmonary disease) ([place]) 07/24/2017  . SVT (supraventricular tachycardia) ([place]) 04/08/2015  . PSVT (paroxysmal supraventricular tachycardia) ([place]) 01/03/2015  . Tobacco use disorder 12/08/2014  . Obesity 09/11/2013  . Hypertension   . Hyperlipidemia   . Thyroid disease   . Vitamin D deficiency     Past Surgical History:  Procedure Laterality Date  . BASAL CELL CARCINOMA EXCISION  2014   "off my chest"  . BLADDER REPAIR   1993   "lasered the holes shut"  . CARDIAC SURGERY    . CARPAL TUNNEL RELEASE Right 1990  . ELECTROPHYSIOLOGIC STUDY N/A 04/08/2015   Procedure: SVT Ablation;  Surgeon: Evans Lance, MD;  Location: [facility];  Service: Cardiovascular;  Laterality: N/A;  . ENDOMETRIAL ABLATION  2009  . INCONTINENCE SURGERY  2010  . JOINT REPLACEMENT    . KNEE ARTHROSCOPY Right 2013  . KNEE ARTHROSCOPY    . PARTIAL KNEE ARTHROPLASTY Right 02/15/2014   Procedure: RIGHT UNICOMPARTMENTAL KNEE (MEDIAL COMPARTMENT);  Surgeon: Vickey Huger, MD;  Location: [facility];  Service: Orthopedics;  Laterality: Right;  . TUBAL LIGATION  1987    OB History    No data available       Home Medications    Prior to Admission medications   Medication Sig Start Date End Date Taking? Authorizing Provider  Acetaminophen (TYLENOL ARTHRITIS EXT RELIEF PO) Take by mouth 2 (two) times daily as needed.     [provider]  fluticasone (FLONASE) 50 MCG/ACT nasal spray Place 1 spray into both nostrils daily.    [provider]  hydrocortisone (ANUSOL-HC) 25 MG suppository Place 1 suppository (25 mg total) 2 (two) times daily as needed rectally for hemorrhoids. 07/01/17   Liane Comber, NP  leflunomide (ARAVA) 20 MG tablet Take 20 mg by mouth daily.    [provider]  linaclotide (LINZESS) 72 MCG capsule Take 1 capsule (72 mcg total) by mouth daily before breakfast. 06/11/17   Vicie Mutters, PA-C  lisinopril-hydrochlorothiazide (ZESTORETIC) 20-12.5 MG tablet Take 1 tablet daily for BP 06/03/17 01/01/18  Unk Pinto, MD  metroNIDAZOLE (METROGEL) 0.75 % gel  07/13/15   [provider]  Multiple Vitamins-Minerals (EMERGEN-C FIVE PO) Take 1 Package by mouth daily.    [provider]  OVER THE COUNTER MEDICATION Take 1 tablet by mouth every morning. Equate brand allergy Rx    [provider]  OVER THE COUNTER MEDICATION Take 1 tablet by mouth daily as needed (acid  reducer). OTC Equate brand Acid Reducer    [provider]  sertraline (ZOLOFT) 50 MG tablet TAKE ONE TABLET BY MOUTH  DAILY 06/29/16   Unk Pinto, MD  SYNTHROID 150 MCG tablet Take 1 tablet (150 mcg total) by mouth daily before breakfast. 03/08/17   Philemon Kingdom, MD  varenicline (CHANTIX CONTINUING MONTH PAK) 1 MG tablet Take 1 tablet (1 mg total) by mouth 2 (two) times daily. 07/24/17   Vicie Mutters, PA-C  VOLTAREN 1 % GEL Apply 2 g topically 4 (four) times daily. 12/28/16   Garald Balding, MD    Family History Family History  Problem Relation Age of Onset  . Hypertension Mother   . Atrial fibrillation Mother   . Diabetes Father   . Emphysema Father   . COPD Father   . Breast cancer Paternal Aunt        x 4 aunts,   . Stroke Maternal Grandmother   . Heart attack Maternal Grandmother   . Hyperlipidemia Maternal Grandfather   . Heart disease Maternal Grandfather   . Hyperlipidemia Paternal Grandmother   . Heart disease Paternal Grandmother   . Hyperlipidemia Paternal Grandfather   . Heart disease Paternal Grandfather     Social History Social History   Tobacco Use  . Smoking status: Current Some Day Smoker    Packs/day: 1.50    Years: 40.00    Pack years: 60.00    Types: Cigarettes  . Smokeless tobacco: Never Used  . Tobacco comment: 04/08/2015 "went from ~ 2 ppd to 3 cigarettes in 1 week"  Substance Use Topics  . Alcohol use: No    Alcohol/week: 0.0 oz    Comment: 04/08/2015 "couple drinks/year"  . Drug use: No     Allergies   Darvocet [propoxyphene n-acetaminophen]; Darvon [propoxyphene hcl]; Propoxyphene; Oseltamivir; and Tamiflu [oseltamivir phosphate]   Review of Systems Review of Systems  All other systems reviewed and are negative.    Physical Exam Updated Vital Signs BP 132/80 (BP Location: Right Arm)   Pulse (!) 115   Temp 97.9 F (36.6 C) (Oral)   Resp 20   SpO2 98%   Physical Exam  Constitutional: She is oriented to  person, place, and time. She appears well-developed and well-nourished.  Non-toxic appearance. No distress.  HENT:  Head: Normocephalic and atraumatic.  Eyes: Conjunctivae, EOM and lids are normal. Pupils are equal, round, and reactive to light.  Neck: Normal range of motion. Neck supple. No tracheal deviation present. No thyroid mass present.  Cardiovascular: Normal rate, regular rhythm and normal heart sounds. Exam reveals no gallop.  No murmur heard. Pulmonary/Chest: Effort normal and breath sounds normal. No stridor. No respiratory distress. She has no decreased breath sounds. She has no wheezes. She has no rhonchi. She has no rales.  Abdominal: Soft. Normal appearance and bowel sounds are normal. She  exhibits no distension. There is no tenderness. There is no rebound and no CVA tenderness.  Genitourinary: Rectal exam shows external hemorrhoid.  Genitourinary Comments: Nonthrombosed external hemorrhoids noted without active rectal bleeding.  Exam was performed in the presence of a nurse.  Musculoskeletal: Normal range of motion. She exhibits no edema or tenderness.  Neurological: She is alert and oriented to person, place, and time. She has normal strength. No cranial nerve deficit or sensory deficit. GCS eye subscore is 4. GCS verbal subscore is 5. GCS motor subscore is 6.  Skin: Skin is warm and dry. No abrasion and no rash noted.  Psychiatric: She has a normal mood and affect. Her speech is normal and behavior is normal.  Nursing note and vitals reviewed.    ED Treatments / Results  Labs (all labs ordered are listed, but only abnormal results are displayed) Labs Reviewed - No data to display  EKG  EKG Interpretation None       Radiology No results found.  Procedures Procedures (including critical care time)  Medications Ordered in ED Medications - No data to display   Initial Impression / Assessment and Plan / ED Course  I have reviewed the triage vital signs and the  nursing notes.  Pertinent labs & imaging results that were available during my care of the patient were reviewed by me and considered in my medical decision making (see chart for details).    Patient to be prescribed Proctofoam and has GI referral for colonoscopy as well as evaluation of this problem in 1.5 months  Final Clinical Impressions(s) / ED Diagnoses   Final diagnoses:  None    ED Discharge Orders    None       Lacretia Leigh, MD 07/27/17 0945

## 2017-07-27 NOTE — ED Notes (Signed)
Has had internal and external hemorrhoids for a while , has seen a dr  And given cream but still is hurting, states wants  A foam

## 2017-07-27 NOTE — ED Triage Notes (Signed)
Patient here with complaint of ongoing hemorrhoid pain x 1 month. States she has tried various treatments with no relief. Alert and oriented. States they are both internal and external

## 2017-07-30 ENCOUNTER — Telehealth: Payer: Self-pay | Admitting: Gastroenterology

## 2017-07-30 ENCOUNTER — Telehealth: Payer: Self-pay

## 2017-07-30 ENCOUNTER — Other Ambulatory Visit: Payer: Self-pay

## 2017-07-30 ENCOUNTER — Other Ambulatory Visit: Payer: Self-pay | Admitting: Physician Assistant

## 2017-07-30 DIAGNOSIS — I10 Essential (primary) hypertension: Secondary | ICD-10-CM

## 2017-07-30 DIAGNOSIS — J0410 Acute tracheitis without obstruction: Secondary | ICD-10-CM

## 2017-07-30 MED ORDER — HYDROCORTISONE ACE-PRAMOXINE 1-1 % RE FOAM: 1.0000 | Freq: Two times a day (BID) | RECTAL | 0 refills | Status: DC

## 2017-07-30 MED ORDER — LISINOPRIL-HYDROCHLOROTHIAZIDE 20-12.5 MG PO TABS: ORAL_TABLET | ORAL | 1 refills | Status: DC

## 2017-07-30 NOTE — Telephone Encounter (Signed)
Pt called to report that she has an appt w/GI in Jan but she is having issues with Hemorrhoids. Please advise.  Per provider pt can cont the rectal foam, sitz bathes & take miralax to prevent constipation.  Pt voiced understanding & hung up.

## 2017-07-30 NOTE — Telephone Encounter (Signed)
Agree, thanks

## 2017-07-30 NOTE — Telephone Encounter (Signed)
Scheduled an appointment for evaluation. She has Proctofoam was prescribed by ED provider which she is using BID. She is taking daily Miralax and has soft stools. Discussed OTC Recticare, sitz bathes and gentle cleaning after a bowel movements.

## 2017-08-07 DIAGNOSIS — M5030 Other cervical disc degeneration, unspecified cervical region: Secondary | ICD-10-CM | POA: Diagnosis not present

## 2017-08-07 DIAGNOSIS — M0579 Rheumatoid arthritis with rheumatoid factor of multiple sites without organ or systems involvement: Secondary | ICD-10-CM | POA: Diagnosis not present

## 2017-08-08 DIAGNOSIS — M722 Plantar fascial fibromatosis: Secondary | ICD-10-CM | POA: Diagnosis not present

## 2017-08-08 DIAGNOSIS — M79672 Pain in left foot: Secondary | ICD-10-CM | POA: Diagnosis not present

## 2017-08-08 DIAGNOSIS — M65872 Other synovitis and tenosynovitis, left ankle and foot: Secondary | ICD-10-CM | POA: Diagnosis not present

## 2017-08-14 ENCOUNTER — Ambulatory Visit: Payer: 59 | Admitting: Gastroenterology

## 2017-08-14 ENCOUNTER — Other Ambulatory Visit: Payer: Self-pay | Admitting: Physician Assistant

## 2017-08-14 ENCOUNTER — Encounter: Payer: Self-pay | Admitting: Gastroenterology

## 2017-08-14 VITALS — BP 104/72 | HR 72 | Ht 65.0 in | Wt 221.4 lb

## 2017-08-14 DIAGNOSIS — K644 Residual hemorrhoidal skin tags: Secondary | ICD-10-CM | POA: Diagnosis not present

## 2017-08-14 DIAGNOSIS — K6289 Other specified diseases of anus and rectum: Secondary | ICD-10-CM

## 2017-08-14 DIAGNOSIS — K645 Perianal venous thrombosis: Secondary | ICD-10-CM | POA: Diagnosis not present

## 2017-08-14 MED ORDER — SYNTHROID 150 MCG PO TABS: 150.0000 ug | ORAL_TABLET | Freq: Every day | ORAL | 3 refills | Status: DC

## 2017-08-14 NOTE — Patient Instructions (Signed)
If you are age 55 or older, your body mass index should be between 23-30. Your Body mass index is 36.84 kg/m. If this is out of the aforementioned range listed, please consider follow up with your Primary Care Provider.  If you are age 83 or younger, your body mass index should be between 19-25. Your Body mass index is 36.84 kg/m. If this is out of the aformentioned range listed, please consider follow up with your Primary Care Provider.   You have an appointment with Carlena Hurl, PA at [facility]. The appointment time is 2:30. You will need to arrive at 2:15 with your $50 copay. Their address is [address]. The office is located on the [address] in [place].  Thank you.

## 2017-08-14 NOTE — Progress Notes (Signed)
Future Appointments  Date Time Provider [place]  08/14/2017 11:00 AM Zehr, Tollie Eth [facility]  08/26/2017 10:00 AM [name] None  09/02/2017  3:30 PM [facility] PREVISIT [address] [facility]  09/16/2017 11:00 AM Mauri Pole, MD [facility]  02/06/2018  3:30 PM Unk Pinto, MD [name] None  07/30/2018 10:00 AM Vicie Mutters, PA-C [name] None

## 2017-08-14 NOTE — Progress Notes (Signed)
08/14/2017 Sherry Dennis [id number] 1961-02-28   HISTORY OF PRESENT ILLNESS:  This is a pleasant 57 year old female who is new to our practice.  She is actually scheduled for a colonoscopy in early February with Dr. Silverio Decamp.  She presents here today at the request of her PCP, Dr. Melford Aase, for evaluation of rectal pain and bleeding.  Says that she's been experiencing this for the pat 1-2 months.  Very painful.  Went to the ED and they prescribed proctofoam, which she has been using as well as doing sitz type baths.  Hurts to sit.  Also uses Lidocaine gel/recticare, which temporarily takes the edge off.  Says that it actually feels better when it bleeds because it relieves some pressure.  Reports 20 pound weight loss over the past month.  Has not been eating much because she does not want to have to go to the bathroom.     Past Medical History:  Diagnosis Date  . Arthritis    "knees, hands, ankles, ~ every joint" (04/08/2015)  . Basal cell carcinoma    "face, hands, chest" (04/08/2015)  . Chronic bronchitis ([place])    "get it pretty much q yr" (04/08/2015)  . Dysrhythmia    palpitations occ; SVT 09/2013 s/p adenosine  . GERD (gastroesophageal reflux disease)   . H/O hiatal hernia   . Hx of cardiovascular stress test    Lexiscan Myoview 6/16: Ejection fraction is 60% and wall motion is normal. The study is normal. There is no scar or ischemia. This is a low risk scan.  . Hyperlipidemia   . Hypertension   . Hypothyroidism   . Palpitations   . Pneumonia 2015 X 1  . PONV (postoperative nausea and vomiting)   . S/P total knee arthroplasty 02/15/2014  . Thyroid disease   . Vitamin D deficiency    Past Surgical History:  Procedure Laterality Date  . BASAL CELL CARCINOMA EXCISION  2014   "off my chest"  . BLADDER REPAIR  1993   "lasered the holes shut"  . CARDIAC SURGERY    . CARPAL TUNNEL RELEASE Right 1990  . ELECTROPHYSIOLOGIC STUDY N/A 04/08/2015   Procedure: SVT Ablation;  Surgeon:  Evans Lance, MD;  Location: [facility];  Service: Cardiovascular;  Laterality: N/A;  . ENDOMETRIAL ABLATION  2009  . INCONTINENCE SURGERY  2010  . JOINT REPLACEMENT    . KNEE ARTHROSCOPY Right 2013  . KNEE ARTHROSCOPY    . PARTIAL KNEE ARTHROPLASTY Right 02/15/2014   Procedure: RIGHT UNICOMPARTMENTAL KNEE (MEDIAL COMPARTMENT);  Surgeon: Vickey Huger, MD;  Location: [facility];  Service: Orthopedics;  Laterality: Right;  . TUBAL LIGATION  1987    reports that she has been smoking cigarettes.  She has a 60.00 pack-year smoking history. she has never used smokeless tobacco. She reports that she does not drink alcohol or use drugs. family history includes Atrial fibrillation in her mother; Breast cancer in her paternal aunt; COPD in her father; Diabetes in her father; Emphysema in her father; Heart attack in her maternal grandmother; Heart disease in her maternal grandfather, paternal grandfather, and paternal grandmother; Hyperlipidemia in her maternal grandfather, paternal grandfather, and paternal grandmother; Hypertension in her mother; Stroke in her maternal grandmother. Allergies  Allergen Reactions  . Darvocet [Propoxyphene N-Acetaminophen] Anaphylaxis  . Darvon [Propoxyphene Hcl] Anaphylaxis and Other (See Comments)    Airway swelling   . Propoxyphene Anaphylaxis    Throat closes up  . Oseltamivir Hives  Hives, elevated BP  . Tamiflu [Oseltamivir Phosphate] Other (See Comments)    Increased blood pressure Hives       Outpatient Encounter Medications as of 08/14/2017  Medication Sig  . Acetaminophen (TYLENOL ARTHRITIS EXT RELIEF PO) Take by mouth 2 (two) times daily as needed.   . fluticasone (FLONASE) 50 MCG/ACT nasal spray Place 1 spray into both nostrils daily.  . hydrocortisone-pramoxine (PROCTOFOAM HC) rectal foam Place 1 applicator rectally 2 (two) times daily.  Marland Kitchen leflunomide (ARAVA) 20 MG tablet Take 20 mg by mouth daily.  Marland Kitchen lisinopril-hydrochlorothiazide (ZESTORETIC)  20-12.5 MG tablet Take 1 tablet daily for BP  . OVER THE COUNTER MEDICATION Take 1 tablet by mouth every morning. Equate brand allergy Rx  . OVER THE COUNTER MEDICATION Take 1 tablet by mouth daily as needed (acid reducer). OTC Equate brand Acid Reducer  . sertraline (ZOLOFT) 50 MG tablet TAKE ONE TABLET BY MOUTH  DAILY  . SYNTHROID 150 MCG tablet Take 1 tablet (150 mcg total) by mouth daily before breakfast.  . VOLTAREN 1 % GEL Apply 2 g topically 4 (four) times daily.  . metroNIDAZOLE (METROGEL) 0.75 % gel   . varenicline (CHANTIX CONTINUING MONTH PAK) 1 MG tablet Take 1 tablet (1 mg total) by mouth 2 (two) times daily. (Patient not taking: Reported on 08/14/2017)  . [DISCONTINUED] linaclotide (LINZESS) 72 MCG capsule Take 1 capsule (72 mcg total) by mouth daily before breakfast.  . [DISCONTINUED] Multiple Vitamins-Minerals (EMERGEN-C FIVE PO) Take 1 Package by mouth daily.   No facility-administered encounter medications on file as of 08/14/2017.      REVIEW OF SYSTEMS  : All other systems reviewed and negative except where noted in the History of Present Illness.   PHYSICAL EXAM: BP 104/72   Pulse 72   Ht 5\' 5"  (1.651 m)   Wt 221 lb 6.4 oz (100.4 kg)   BMI 36.84 kg/m  General: Well developed white female in no acute distress Head: Normocephalic and atraumatic Eyes:  Sclerae anicteric, conjunctiva pink. Ears: Normal auditory acuity Lungs: Clear throughout to auscultation; no increased WOB. Heart: Regular rate and rhythm; no M/R/G. Abdomen: Soft, non-distended.  BS present.  Non-tender. Rectal:  Large ulcerated external hemorrhoid seen that is very painful on exam.  DRE was very painful, but no definite masses noted. Musculoskeletal: Symmetrical with no gross deformities  Skin: No lesions on visible extremities Extremities: No edema  Neurological: Alert oriented x 4, grossly non-focal Psychological:  Alert and cooperative. Normal mood and affect  ASSESSMENT AND PLAN: *Large  ulcerated external hemorrhoid:  Has failed one month of conservative treatment.  Unfortunately I do not think that there is anything else that I can offer her today.  I am going to refer her to [facility] and see if they are able to see her today as she has been in a lot of pain.   CC:  Unk Pinto, MD

## 2017-08-19 NOTE — Progress Notes (Signed)
Reviewed and agree with documentation and assessment and plan. K. Veena Nima Kemppainen , MD   

## 2017-08-21 ENCOUNTER — Ambulatory Visit: Payer: Commercial Managed Care - HMO | Admitting: Physician Assistant

## 2017-08-23 DIAGNOSIS — M0579 Rheumatoid arthritis with rheumatoid factor of multiple sites without organ or systems involvement: Secondary | ICD-10-CM | POA: Diagnosis not present

## 2017-08-24 DIAGNOSIS — J019 Acute sinusitis, unspecified: Secondary | ICD-10-CM | POA: Diagnosis not present

## 2017-08-24 DIAGNOSIS — B9689 Other specified bacterial agents as the cause of diseases classified elsewhere: Secondary | ICD-10-CM | POA: Diagnosis not present

## 2017-08-26 ENCOUNTER — Other Ambulatory Visit: Payer: 59

## 2017-08-26 DIAGNOSIS — N289 Disorder of kidney and ureter, unspecified: Secondary | ICD-10-CM

## 2017-08-26 DIAGNOSIS — E538 Deficiency of other specified B group vitamins: Secondary | ICD-10-CM | POA: Diagnosis not present

## 2017-08-26 DIAGNOSIS — E039 Hypothyroidism, unspecified: Secondary | ICD-10-CM | POA: Diagnosis not present

## 2017-08-27 LAB — BASIC METABOLIC PANEL WITH GFR
BUN: 12 mg/dL (ref 7–25)
CHLORIDE: 99 mmol/L (ref 98–110)
CO2: 27 mmol/L (ref 20–32)
Calcium: 9.4 mg/dL (ref 8.6–10.4)
Creat: 0.93 mg/dL (ref 0.50–1.05)
GFR, EST NON AFRICAN AMERICAN: 70 mL/min/{1.73_m2} (ref >=60)
GFR, Est African American: 81 mL/min/{1.73_m2} (ref >=60)
Glucose, Bld: 124 mg/dL — ABNORMAL HIGH (ref 65–99)
POTASSIUM: 3.6 mmol/L (ref 3.5–5.3)
Sodium: 134 mmol/L — ABNORMAL LOW (ref 135–146)

## 2017-08-27 LAB — VITAMIN B12: VITAMIN B 12: 263 pg/mL (ref 200–1100)

## 2017-08-27 LAB — TSH: TSH: 0.71 mIU/L

## 2017-08-29 NOTE — Progress Notes (Signed)
Assessment and Plan:  Tempie was seen today for vomiting and weakness.  Diagnoses and all orders for this visit:  Dehydration/ Nausea, vomiting and diarrhea Severe nausea/vomiting/diarrhea x5 days, hypotensive, dizzy, weak + hx SVT Discussed with patient - will send to ED for STAT labs and IV fluids  Abdominal pain, LUQ ? Musculoskeletal due to coughing, r/o other etiology at ED  Further disposition pending results of labs. Discussed med's effects and SE's.   Over 15 minutes of exam, counseling, chart review, and critical decision making was performed.   Future Appointments  Date Time Provider [facility]  09/02/2017  3:30 PM [facility] PREVISIT RM [facility]  09/16/2017 11:00 AM Mauri Pole, MD [facility]  02/06/2018  3:30 PM Unk Pinto, MD [name] None  07/30/2018 10:00 AM Vicie Mutters, PA-C [name] None    ------------------------------------------------------------------------------------------------------------------   HPI BP (!) 98/58   Pulse (!) 114   Temp (!) 97.5 F (36.4 C)   Ht 5\' 5"  (1.651 m)   Wt 210 lb (95.3 kg)   SpO2 97%   BMI 34.95 kg/m   57 y.o.female presents for vomiting x 5 days - reports she has been unable to keep food down and is throwing up. She endorses fever/chills first 2 days but this has resolved.   She reports she was evaluated for sinus issues at [facility] urgent care and was prescribed doxycycline; this made her "deathly ill" and she was switched to biaxin - which has been improving cold symptoms. Nausea/vomiting has continued since starting doxycycline. She denies abdominal pain, endorses she still has an appetite. She reports she becomes very nauseous with smelling any food. She has been unable to keep any food down, but states she has been drinking 4-5 bottles of water. She reports emesis has consisted of water and "stomach acid" - mostly clear. She also endorses watery diarrhea 7-8 episodes, yesterday  had 4 episodes and has not had any today. She reports she bought some pedialyte and tried to drink but caused more emesis. She states "I feel like I'm dying."  She endorses left lower chest/LUQ abdominal pain that began 2 days ago - intermittent, experiences with coughing, and flexing abdomen - has been splinting abdomen when coughing which helps.   She has hx of GERD and hiatal hernia - noted on EGD 10-15 years ago;  - no imaging available for review. Colonoscopy - has scheduled for 09/16/2017 -   Surgical hx: tubal ligation 31 years ago, ablation  Past Medical History:  Diagnosis Date  . Arthritis    "knees, hands, ankles, ~ every joint" (04/08/2015)  . Basal cell carcinoma    "face, hands, chest" (04/08/2015)  . Chronic bronchitis ([place])    "get it pretty much q yr" (04/08/2015)  . Dysrhythmia    palpitations occ; SVT 09/2013 s/p adenosine  . GERD (gastroesophageal reflux disease)   . H/O hiatal hernia   . Hx of cardiovascular stress test    Lexiscan Myoview 6/16: Ejection fraction is 60% and wall motion is normal. The study is normal. There is no scar or ischemia. This is a low risk scan.  . Hyperlipidemia   . Hypertension   . Hypothyroidism   . Palpitations   . Pneumonia 2015 X 1  . PONV (postoperative nausea and vomiting)   . S/P total knee arthroplasty 02/15/2014  . Thyroid disease   . Vitamin D deficiency      Allergies  Allergen Reactions  . Darvocet [Propoxyphene N-Acetaminophen] Anaphylaxis  .  Darvon [Propoxyphene Hcl] Anaphylaxis and Other (See Comments)    Airway swelling   . Propoxyphene Anaphylaxis    Throat closes up  . Doxycycline Nausea Only  . Oseltamivir Hives    Hives, elevated BP  . Tamiflu [Oseltamivir Phosphate] Other (See Comments)    Increased blood pressure Hives     Current Outpatient Medications on File Prior to Visit  Medication Sig  . Acetaminophen (TYLENOL ARTHRITIS EXT RELIEF PO) Take by mouth 2 (two) times daily as needed.   .  hydrocortisone-pramoxine (PROCTOFOAM HC) rectal foam Place 1 applicator rectally 2 (two) times daily.  Marland Kitchen leflunomide (ARAVA) 20 MG tablet Take 20 mg by mouth daily.  Marland Kitchen lisinopril-hydrochlorothiazide (ZESTORETIC) 20-12.5 MG tablet Take 1 tablet daily for BP  . metroNIDAZOLE (METROGEL) 0.75 % gel   . OVER THE COUNTER MEDICATION Take 1 tablet by mouth every morning. Equate brand allergy Rx  . OVER THE COUNTER MEDICATION Take 1 tablet by mouth daily as needed (acid reducer). OTC Equate brand Acid Reducer  . sertraline (ZOLOFT) 50 MG tablet TAKE ONE TABLET BY MOUTH  DAILY  . SYNTHROID 150 MCG tablet Take 1 tablet (150 mcg total) by mouth daily before breakfast.  . varenicline (CHANTIX CONTINUING MONTH PAK) 1 MG tablet Take 1 tablet (1 mg total) by mouth 2 (two) times daily.  . VOLTAREN 1 % GEL Apply 2 g topically 4 (four) times daily.  . fluticasone (FLONASE) 50 MCG/ACT nasal spray Place 1 spray into both nostrils daily.   No current facility-administered medications on file prior to visit.     ROS: Review of Systems  Constitutional: Negative for chills, fever, malaise/fatigue and weight loss.  HENT: Positive for congestion. Negative for hearing loss and tinnitus.   Eyes: Negative for blurred vision and double vision.  Respiratory: Positive for cough. Negative for sputum production, shortness of breath and wheezing.   Cardiovascular: Negative for chest pain, palpitations, orthopnea, claudication and leg swelling.  Gastrointestinal: Positive for nausea and vomiting. Negative for abdominal pain, blood in stool, constipation, diarrhea, heartburn and melena.  Genitourinary: Negative.   Musculoskeletal: Negative for joint pain and myalgias.       Left lower chest wall/upper abdominal pain with flexion  Skin: Negative for rash.  Neurological: Positive for dizziness and weakness. Negative for tingling, sensory change, focal weakness and headaches.  Endo/Heme/Allergies: Negative for polydipsia.   Psychiatric/Behavioral: Negative.   All other systems reviewed and are negative.    Physical Exam:  BP (!) 98/58   Pulse (!) 114   Temp (!) 97.5 F (36.4 C)   Ht 5\' 5"  (1.651 m)   Wt 210 lb (95.3 kg)   SpO2 97%   BMI 34.95 kg/m   General Appearance: Well nourished, appears ill, in no acute distress. Eyes: PERRLA, conjunctiva no swelling or erythema ENT/Mouth:  No erythema, swelling, or exudate on post pharynx.  Tonsils not swollen or erythematous. Hearing normal.  Neck: Supple, thyroid normal.  Respiratory: Respiratory effort normal, BS equal bilaterally without rales, rhonchi, wheezing or stridor.  Cardio: RRR with no MRGs. Brisk peripheral pulses without edema. + dizziness with postural changes Abdomen: Soft, + hyperactive BS.  Mild generalized tenderness, no guarding, rebound, hernias, masses. Lymphatics: Non tender without lymphadenopathy.  Musculoskeletal: Full ROM, symmetrical strength, normal gait - after dizziness from postural changes resolves Skin: Warm, dry without rashes, lesions, ecchymosis.  Neuro: Cranial nerves intact. Normal muscle tone, no cerebellar symptoms. Sensation intact.  Psych: Awake and oriented X 3, normal affect, Insight and  Judgment appropriate.     Izora Ribas, NP 12:24 PM [facility]

## 2017-08-30 ENCOUNTER — Encounter (HOSPITAL_COMMUNITY): Payer: Self-pay | Admitting: Emergency Medicine

## 2017-08-30 ENCOUNTER — Emergency Department (HOSPITAL_COMMUNITY)
Admission: EM | Admit: 2017-08-30 | Discharge: 2017-08-30 | Disposition: A | Discharge status: Home / Self Care | Payer: 59 | Attending: Emergency Medicine | Admitting: Emergency Medicine

## 2017-08-30 ENCOUNTER — Emergency Department (HOSPITAL_COMMUNITY): Payer: 59

## 2017-08-30 ENCOUNTER — Encounter: Payer: Self-pay | Admitting: Adult Health

## 2017-08-30 ENCOUNTER — Other Ambulatory Visit: Payer: Self-pay

## 2017-08-30 ENCOUNTER — Ambulatory Visit: Payer: 59 | Admitting: Adult Health

## 2017-08-30 VITALS — BP 98/58 | HR 114 | Temp 97.5°F | Ht 65.0 in | Wt 210.0 lb

## 2017-08-30 DIAGNOSIS — R197 Diarrhea, unspecified: Secondary | ICD-10-CM | POA: Diagnosis not present

## 2017-08-30 DIAGNOSIS — J449 Chronic obstructive pulmonary disease, unspecified: Secondary | ICD-10-CM | POA: Insufficient documentation

## 2017-08-30 DIAGNOSIS — Z85828 Personal history of other malignant neoplasm of skin: Secondary | ICD-10-CM | POA: Insufficient documentation

## 2017-08-30 DIAGNOSIS — E039 Hypothyroidism, unspecified: Secondary | ICD-10-CM | POA: Insufficient documentation

## 2017-08-30 DIAGNOSIS — R05 Cough: Secondary | ICD-10-CM | POA: Diagnosis not present

## 2017-08-30 DIAGNOSIS — R059 Cough, unspecified: Secondary | ICD-10-CM

## 2017-08-30 DIAGNOSIS — E860 Dehydration: Secondary | ICD-10-CM

## 2017-08-30 DIAGNOSIS — R1012 Left upper quadrant pain: Secondary | ICD-10-CM | POA: Diagnosis not present

## 2017-08-30 DIAGNOSIS — F17210 Nicotine dependence, cigarettes, uncomplicated: Secondary | ICD-10-CM | POA: Diagnosis not present

## 2017-08-30 DIAGNOSIS — Z96651 Presence of right artificial knee joint: Secondary | ICD-10-CM | POA: Diagnosis not present

## 2017-08-30 DIAGNOSIS — R1013 Epigastric pain: Secondary | ICD-10-CM | POA: Diagnosis not present

## 2017-08-30 DIAGNOSIS — Z79899 Other long term (current) drug therapy: Secondary | ICD-10-CM | POA: Insufficient documentation

## 2017-08-30 DIAGNOSIS — R112 Nausea with vomiting, unspecified: Secondary | ICD-10-CM

## 2017-08-30 DIAGNOSIS — I10 Essential (primary) hypertension: Secondary | ICD-10-CM | POA: Insufficient documentation

## 2017-08-30 DIAGNOSIS — R1110 Vomiting, unspecified: Secondary | ICD-10-CM | POA: Diagnosis not present

## 2017-08-30 LAB — URINALYSIS, ROUTINE W REFLEX MICROSCOPIC
Bilirubin Urine: NEGATIVE
GLUCOSE, UA: NEGATIVE mg/dL
Ketones, ur: 5 mg/dL — AB
Leukocytes, UA: NEGATIVE
NITRITE: NEGATIVE
PROTEIN: 30 mg/dL — AB
Specific Gravity, Urine: 1.005 (ref 1.005–1.030)
pH: 5 (ref 5.0–8.0)

## 2017-08-30 LAB — I-STAT CG4 LACTIC ACID, ED
LACTIC ACID, VENOUS: 1.22 mmol/L (ref 0.5–1.9)
Lactic Acid, Venous: 1.83 mmol/L (ref 0.5–1.9)

## 2017-08-30 LAB — CBC
HCT: 36.4 % (ref 36.0–46.0)
HEMOGLOBIN: 12.9 g/dL (ref 12.0–15.0)
MCH: 33.4 pg (ref 26.0–34.0)
MCHC: 35.4 g/dL (ref 30.0–36.0)
MCV: 94.3 fL (ref 78.0–100.0)
Platelets: 208 10*3/uL (ref 150–400)
RBC: 3.86 MIL/uL — AB (ref 3.87–5.11)
RDW: 12.9 % (ref 11.5–15.5)
WBC: 4.8 10*3/uL (ref 4.0–10.5)

## 2017-08-30 LAB — COMPREHENSIVE METABOLIC PANEL
ALK PHOS: 84 U/L (ref 38–126)
ALT: 60 U/L — ABNORMAL HIGH (ref 14–54)
ANION GAP: 18 — AB (ref 5–15)
AST: 50 U/L — ABNORMAL HIGH (ref 15–41)
Albumin: 3.5 g/dL (ref 3.5–5.0)
BILIRUBIN TOTAL: 1.2 mg/dL (ref 0.3–1.2)
BUN: 26 mg/dL — ABNORMAL HIGH (ref 6–20)
CALCIUM: 8.9 mg/dL (ref 8.9–10.3)
CO2: 17 mmol/L — ABNORMAL LOW (ref 22–32)
Chloride: 90 mmol/L — ABNORMAL LOW (ref 101–111)
Creatinine, Ser: 1.51 mg/dL — ABNORMAL HIGH (ref 0.44–1.00)
GFR calc Af Amer: 44 mL/min — ABNORMAL LOW (ref >60)
GFR, EST NON AFRICAN AMERICAN: 38 mL/min — AB (ref >60)
Glucose, Bld: 118 mg/dL — ABNORMAL HIGH (ref 65–99)
Potassium: 3.8 mmol/L (ref 3.5–5.1)
Sodium: 125 mmol/L — ABNORMAL LOW (ref 135–145)
TOTAL PROTEIN: 7 g/dL (ref 6.5–8.1)

## 2017-08-30 LAB — I-STAT TROPONIN, ED: Troponin i, poc: 0.01 ng/mL (ref 0.00–0.08)

## 2017-08-30 LAB — I-STAT BETA HCG BLOOD, ED (MC, WL, AP ONLY): I-stat hCG, quantitative: <5 m[IU]/mL (ref <5)

## 2017-08-30 LAB — LIPASE, BLOOD: Lipase: 67 U/L — ABNORMAL HIGH (ref 11–51)

## 2017-08-30 MED ORDER — LACTATED RINGERS IV BOLUS (SEPSIS)
1000.0000 mL | Freq: Once | INTRAVENOUS | Status: AC
  Administered 2017-08-30: 1000 mL via INTRAVENOUS

## 2017-08-30 MED ORDER — ONDANSETRON HCL 4 MG/2ML IJ SOLN
4.0000 mg | Freq: Once | INTRAMUSCULAR | Status: AC
  Administered 2017-08-30: 4 mg via INTRAVENOUS
  Filled 2017-08-30: qty 2

## 2017-08-30 MED ORDER — ONDANSETRON HCL 4 MG PO TABS: 4.0000 mg | ORAL_TABLET | Freq: Three times a day (TID) | ORAL | 0 refills | Status: DC | PRN

## 2017-08-30 MED ORDER — IOPAMIDOL (ISOVUE-300) INJECTION 61%
INTRAVENOUS | Status: AC
  Administered 2017-08-30: 100 mL
  Filled 2017-08-30: qty 100

## 2017-08-30 MED ORDER — IPRATROPIUM-ALBUTEROL 0.5-2.5 (3) MG/3ML IN SOLN
3.0000 mL | Freq: Once | RESPIRATORY_TRACT | Status: AC
  Administered 2017-08-30: 3 mL via RESPIRATORY_TRACT
  Filled 2017-08-30: qty 3

## 2017-08-30 NOTE — ED Provider Notes (Signed)
[facility] EMERGENCY DEPARTMENT Provider Note   CSN: [id number] Arrival date & time: 08/30/17  1242     History   Chief Complaint Chief Complaint  Patient presents with  . Emesis  . Diarrhea    HPI Sherry Dennis is a 57 y.o. female.   The history is provided by the patient.   Emesis    This is a new problem. Episode onset: 1 week ago.  The problem occurs 2 to 4 times per day. The problem has not changed since onset.The emesis has an appearance of stomach contents. Associated symptoms include abdominal pain (epigastric), cough, diarrhea and a fever (on day 1 of illness, then resolved). Pertinent negatives include no arthralgias, no chills and no myalgias.   Diarrhea    This is a new problem. The problem occurs 2 to 4 times per day. The stool consistency is described as watery. Associated symptoms include abdominal pain (epigastric), vomiting and cough. Pertinent negatives include no chills, no arthralgias and no myalgias.    Past Medical History:  Diagnosis Date  . Arthritis    "knees, hands, ankles, ~ every joint" (04/08/2015)  . Basal cell carcinoma    "face, hands, chest" (04/08/2015)  . Chronic bronchitis ([place])    "get it pretty much q yr" (04/08/2015)  . Dysrhythmia    palpitations occ; SVT 09/2013 s/p adenosine  . GERD (gastroesophageal reflux disease)   . H/O hiatal hernia   . Hx of cardiovascular stress test    Lexiscan Myoview 6/16: Ejection fraction is 60% and wall motion is normal. The study is normal. There is no scar or ischemia. This is a low risk scan.  . Hyperlipidemia   . Hypertension   . Hypothyroidism   . Palpitations   . Pneumonia 2015 X 1  . PONV (postoperative nausea and vomiting)   . S/P total knee arthroplasty 02/15/2014  . Thyroid disease   . Vitamin D deficiency     Patient Active Problem List   Diagnosis Date Noted  . External hemorrhoid, bleeding 08/14/2017  . Rectal pain 08/14/2017  . Rheumatoid arthritis ([place]) 07/24/2017   . COPD (chronic obstructive pulmonary disease) ([place]) 07/24/2017  . SVT (supraventricular tachycardia) ([place]) 04/08/2015  . PSVT (paroxysmal supraventricular tachycardia) ([place]) 01/03/2015  . Tobacco use disorder 12/08/2014  . Obesity 09/11/2013  . Hypertension   . Hyperlipidemia   . Thyroid disease   . Vitamin D deficiency     Past Surgical History:  Procedure Laterality Date  . BASAL CELL CARCINOMA EXCISION  2014   "off my chest"  . BLADDER REPAIR  1993   "lasered the holes shut"  . CARDIAC SURGERY    . CARPAL TUNNEL RELEASE Right 1990  . ELECTROPHYSIOLOGIC STUDY N/A 04/08/2015   Procedure: SVT Ablation;  Surgeon: Evans Lance, MD;  Location: [facility];  Service: Cardiovascular;  Laterality: N/A;  . ENDOMETRIAL ABLATION  2009  . INCONTINENCE SURGERY  2010  . JOINT REPLACEMENT    . KNEE ARTHROSCOPY Right 2013  . KNEE ARTHROSCOPY    . PARTIAL KNEE ARTHROPLASTY Right 02/15/2014   Procedure: RIGHT UNICOMPARTMENTAL KNEE (MEDIAL COMPARTMENT);  Surgeon: Vickey Huger, MD;  Location: [facility];  Service: Orthopedics;  Laterality: Right;  . TUBAL LIGATION  1987    OB History    No data available       Home Medications    Prior to Admission medications   Medication Sig Start Date End Date Taking? Authorizing Provider  acetaminophen (  TYLENOL) 650 MG CR tablet Take 1,300 mg by mouth 2 (two) times daily as needed for pain.   Yes [provider]  clarithromycin (BIAXIN XL) 500 MG 24 hr tablet Take 500 mg by mouth 2 (two) times daily. 14 day course started 08/26/17 pm 08/26/17  Yes [provider]  diphenhydrAMINE (BENADRYL) 25 MG tablet Take 25 mg by mouth daily as needed for allergies.   Yes [provider]  fluticasone (FLONASE) 50 MCG/ACT nasal spray Place 1 spray into both nostrils daily as needed (seasonal allergies).    Yes [provider]  leflunomide (ARAVA) 20 MG tablet Take 20 mg by mouth daily after supper.    Yes [provider]   lisinopril-hydrochlorothiazide (ZESTORETIC) 20-12.5 MG tablet Take 1 tablet daily for BP Patient taking differently: Take 1 tablet by mouth daily. for BP 07/30/17 02/27/18 Yes Vicie Mutters, PA-C  metroNIDAZOLE (METROGEL) 0.75 % gel Apply 1 application topically at bedtime. For rosacea 07/13/15  Yes [provider]  OVER THE COUNTER MEDICATION Take 1 tablet by mouth daily as needed (acid reflux). OTC Equate brand Acid Reducer   Yes [provider]  polyethylene glycol (MIRALAX / GLYCOLAX) packet Take 17 g by mouth daily. Mix in 8 oz liquid and drink   Yes [provider]  SYNTHROID 150 MCG tablet Take 1 tablet (150 mcg total) by mouth daily before breakfast. 08/14/17  Yes Vicie Mutters, PA-C  VOLTAREN 1 % GEL Apply 2 g topically 4 (four) times daily. Patient taking differently: Apply 2 g topically 4 (four) times daily as needed (joint pain).  12/28/16  Yes Garald Balding, MD  hydrocortisone-pramoxine (PROCTOFOAM [facility]) rectal foam Place 1 applicator rectally 2 (two) times daily. Patient not taking: Reported on 08/30/2017 07/30/17   Vicie Mutters, PA-C  sertraline (ZOLOFT) 50 MG tablet TAKE ONE TABLET BY MOUTH  DAILY Patient not taking: Reported on 08/30/2017 06/29/16   Unk Pinto, MD  varenicline (CHANTIX CONTINUING MONTH PAK) 1 MG tablet Take 1 tablet (1 mg total) by mouth 2 (two) times daily. 07/24/17   Vicie Mutters, PA-C    Family History Family History  Problem Relation Age of Onset  . Hypertension Mother   . Atrial fibrillation Mother   . Diabetes Father   . Emphysema Father   . COPD Father   . Breast cancer Paternal Aunt        x 4 aunts,   . Stroke Maternal Grandmother   . Heart attack Maternal Grandmother   . Hyperlipidemia Maternal Grandfather   . Heart disease Maternal Grandfather   . Hyperlipidemia Paternal Grandmother   . Heart disease Paternal Grandmother   . Hyperlipidemia Paternal Grandfather   . Heart disease Paternal Grandfather      Social History Social History   Tobacco Use  . Smoking status: Current Some Day Smoker    Packs/day: 1.50    Years: 40.00    Pack years: 60.00    Types: Cigarettes  . Smokeless tobacco: Never Used  . Tobacco comment: 04/08/2015 "went from ~ 2 ppd to 3 cigarettes in 1 week"  Substance Use Topics  . Alcohol use: No    Alcohol/week: 0.0 oz    Comment: 04/08/2015 "couple drinks/year"  . Drug use: No     Allergies   Darvocet [propoxyphene n-acetaminophen]; Darvon [propoxyphene hcl]; Propoxyphene; Doxycycline; Oseltamivir; and Tamiflu [oseltamivir phosphate]   Review of Systems Review of Systems  Constitutional: Positive for fever (on day 1 of illness, then resolved). Negative for  chills.  HENT: Negative for ear pain and sore throat.   Eyes: Negative for pain and visual disturbance.  Respiratory: Positive for cough. Negative for shortness of breath.   Cardiovascular: Negative for chest pain and palpitations.  Gastrointestinal: Positive for abdominal pain (epigastric), diarrhea and vomiting.  Genitourinary: Negative for dysuria and hematuria.  Musculoskeletal: Negative for arthralgias, back pain and myalgias.  Skin: Negative for color change and rash.  Neurological: Negative for seizures and syncope.  All other systems reviewed and are negative.    Physical Exam Updated Vital Signs BP 94/69   Pulse (!) 113   Temp 97.6 F (36.4 C) (Oral)   Resp 20   SpO2 98%   Physical Exam  Constitutional: She is oriented to person, place, and time. She appears well-developed and well-nourished. No distress.  HENT:  Head: Normocephalic and atraumatic.  Eyes: Conjunctivae are normal.  Neck: Normal range of motion. Neck supple.  Cardiovascular: Normal rate and regular rhythm.  Pulmonary/Chest: Effort normal and breath sounds normal. No respiratory distress.  Abdominal: Soft. There is tenderness (epigastric).  Musculoskeletal: She exhibits no edema.  Neurological: She is alert and  oriented to person, place, and time.  Skin: Skin is warm and dry.  Psychiatric: She has a normal mood and affect.  Nursing note and vitals reviewed.    ED Treatments / Results  Labs (all labs ordered are listed, but only abnormal results are displayed) Labs Reviewed  LIPASE, BLOOD - Abnormal; Notable for the following components:      Result Value   Lipase 67 (*)    All other components within normal limits  COMPREHENSIVE METABOLIC PANEL - Abnormal; Notable for the following components:   Sodium 125 (*)    Chloride 90 (*)    CO2 17 (*)    Glucose, Bld 118 (*)    BUN 26 (*)    Creatinine, Ser 1.51 (*)    AST 50 (*)    ALT 60 (*)    GFR calc non Af Amer 38 (*)    GFR calc Af Amer 44 (*)    Anion gap 18 (*)    All other components within normal limits  CBC - Abnormal; Notable for the following components:   RBC 3.86 (*)    All other components within normal limits  URINALYSIS, ROUTINE W REFLEX MICROSCOPIC  I-STAT BETA HCG BLOOD, ED ([facility], [facility], [facility] ONLY)  I-STAT CG4 LACTIC ACID, ED  I-STAT CG4 LACTIC ACID, ED    EKG  EKG Interpretation None       Radiology Dg Chest 2 View  Result Date: 08/30/2017 CLINICAL DATA:  Cough EXAM: CHEST  2 VIEW COMPARISON:  09/25/2013 FINDINGS: The heart size and mediastinal contours are within normal limits. Both lungs are clear. The visualized skeletal structures are unremarkable. IMPRESSION: No active cardiopulmonary disease. Electronically Signed   By: Franchot Gallo M.D.   On: 08/30/2017 17:42   Ct Abdomen Pelvis W Contrast  Result Date: 08/30/2017 CLINICAL DATA:  Nausea and vomiting. EXAM: CT ABDOMEN AND PELVIS WITH CONTRAST TECHNIQUE: Multidetector CT imaging of the abdomen and pelvis was performed using the standard protocol following bolus administration of intravenous contrast. CONTRAST:  112mL ISOVUE-300 IOPAMIDOL (ISOVUE-300) INJECTION 61% COMPARISON:  None. FINDINGS: Lower chest: No acute abnormality. Hepatobiliary: No focal liver  abnormality is seen. No gallstones, gallbladder wall thickening, or biliary dilatation. Pancreas: Unremarkable. No pancreatic ductal dilatation or surrounding inflammatory changes. Spleen: Normal in size without focal abnormality. Adrenals/Urinary Tract: Normal appearance of the  right adrenal gland. Nodule in the left adrenal gland is indeterminate measuring 3.5 cm and 24 HU. Unremarkable appearance of both kidneys. No mass or hydronephrosis. The urinary bladder is normal. Stomach/Bowel: Stomach is normal. The small bowel loops have a normal course and caliber without evidence for bowel obstruction. The appendix is not visualized. No pathologic dilatation of the colon. No bowel wall thickening or inflammation identified. Vascular/Lymphatic: Aortic atherosclerosis. No aneurysm. No abdominal or pelvic adenopathy. Reproductive: Uterus and bilateral adnexa are unremarkable. Other: No abdominal wall hernia or abnormality. No abdominopelvic ascites. Musculoskeletal: No aggressive lytic or sclerotic bone lesions. IMPRESSION: 1. No acute findings within the abdomen or pelvis.  Indeterminate 2. Indeterminate left adrenal nodule measuring 3.5 cm. Follow-up imaging with nonemergent, adrenal protocol MRI or CT is advised. 3.  Aortic Atherosclerosis (ICD10-I70.0). Electronically Signed   By: Kerby Moors M.D.   On: 08/30/2017 21:54    Procedures Procedures (including critical care time)  Medications Ordered in ED Medications  lactated ringers bolus 1,000 mL (not administered)  ipratropium-albuterol (DUONEB) 0.5-2.5 (3) MG/3ML nebulizer solution 3 mL (not administered)     Initial Impression / Assessment and Plan / ED Course  I have reviewed the triage vital signs and the nursing notes.  Pertinent labs & imaging results that were available during my care of the patient were reviewed by me and considered in my medical decision making (see chart for details).     Ms. Bowring is a 57 year old female with past  medical history significant for tobacco abuse, GERD, hypertension, hyperlipidemia, hypothyroidism who presents for emesis, diarrhea, and cough.  The patient was treated by her PCP and started on doxycycline then transition to clarithromycin due to non-tolerance.  She states her cough is improved but the emesis and diarrhea have persisted.  Labs significant for normal lactic acid, mildly elevated lipase, hyponatremia, metabolic acidosis, elevated creatinine, elevated liver enzymes.  Patient has wheezing on exam and is given nebulizer treatment with improvement.  Patient is borderline hypotensive and tachycardic.  She is given fluid resuscitation with complete resolution of symptoms.  CT abd/pelvis obtained and demonstrates no acute pathology; incidental findings noted and communicated to the patient for outpatient follow up.  Patient is discharged to home with strict return precautions, follow up instructions, and educational materials.  Final Clinical Impressions(s) / ED Diagnoses   Final diagnoses:  Dehydration  Nausea vomiting and diarrhea    ED Discharge Orders        Ordered    ondansetron (ZOFRAN) 4 MG tablet  Every 8 hours PRN     08/30/17 2250      Elveria Rising, MD 08/30/17 2300  Duffy Bruce, MD 08/31/17 1058

## 2017-08-30 NOTE — ED Provider Notes (Signed)
I saw and evaluated the patient, reviewed the resident's note and I agree with the findings and plan. I was present and provided direct supervision for all procedures. Please see associated encounter note. Briefly, the patient is a 57 year old female here with nausea, vomiting, and diarrhea.  This occurs in the setting of recent antibiotic use for sinusitis.  I suspect it is likely related to gastritis/antibiotic associated GI upset, but patient does have mild, left upper quadrant, left lower quadrant, and right upper quadrant tenderness on my exam.  She does have mild elevation in her AST, ALT, and lipase but I suspect this is due to her dehydration and vomiting.  Bilirubin is normal, making gallbladder disease less likely.  Given undifferentiated nature, will obtain CT scan, continue fluids.  She is otherwise afebrile, without signs of sepsis at this time.  Lactic acid is normal which is reassuring.  White count is normal.  I suspect that if CT is unremarkable and her symptoms are improving, can likely be managed with outpatient follow-up.   EKG Interpretation None         Duffy Bruce, MD 08/30/17 [phone]

## 2017-08-30 NOTE — Discharge Instructions (Signed)
Indeterminate left adrenal nodule measuring 3.5 cm. Follow-up imaging with nonemergent, adrenal protocol MRI or CT is advised.

## 2017-08-30 NOTE — ED Notes (Signed)
EDP explained tests results and plan of care to pt. , respirations unlabored , denies pain , no nausea or emesis , IV site intact , LR IV bolus infusing .

## 2017-08-30 NOTE — ED Notes (Signed)
Patient transported to x-ray. ?

## 2017-08-30 NOTE — ED Triage Notes (Signed)
Pt was sent by her doctor, n/v/d x5 days, pt sent for labs and evaluation of dehydration. A/ox4, resp e/u, nad.

## 2017-08-30 NOTE — ED Notes (Signed)
Pt taken to restroom and attempted to urinate, pt was unsuccessful.

## 2017-09-02 DIAGNOSIS — I700 Atherosclerosis of aorta: Secondary | ICD-10-CM | POA: Insufficient documentation

## 2017-09-02 DIAGNOSIS — D3500 Benign neoplasm of unspecified adrenal gland: Secondary | ICD-10-CM | POA: Insufficient documentation

## 2017-09-02 NOTE — Progress Notes (Signed)
Assessment and Plan:  Marvena was seen today for follow-up.  Diagnoses and all orders for this visit:  Dehydration -     Comprehensive metabolic panel  Aortic atherosclerosis (HCC) Control blood pressure, cholesterol, glucose, increase exercise.   Adrenal nodule (HCC) -     CT ADRENAL ABDOMEN WO CONTRAST; Future  Nausea -     Comprehensive metabolic panel -     ondansetron (ZOFRAN) 4 MG tablet; Take 1 tablet (4 mg total) by mouth every 8 (eight) hours as needed for up to 6 doses for nausea or vomiting. Keep appointment with GI later this week  Essential hypertension      D/c lisinopril/hctz - likely metallic taste related to lisinopril. Will hold off starting on alternate agent at this time due to recent dehydration and hypotension/tachycardia. Monitor blood pressure at home; call if consistently over 130/80 - will schedule a follow up appointment to start valsartan 40 mg  Continue DASH diet.   Reminder to go to the ER if any CP, SOB, nausea, dizziness, severe HA, changes vision/speech, left arm numbness and tingling and jaw pain.  Further disposition pending results of labs. Discussed med's effects and SE's.   Over 15 minutes of exam, counseling, chart review, and critical decision making was performed.   Future Appointments  Date Time Provider [place]  09/06/2017 10:00 AM Doran Stabler, MD [facility]  02/06/2018  3:30 PM Unk Pinto, MD [name] None  07/30/2018 10:00 AM Vicie Mutters, PA-C [name] None    ------------------------------------------------------------------------------------------------------------------   HPI BP 130/88   Pulse (!) 108   Temp 98 F (36.7 C)   Ht 5\' 5"  (1.651 m)   Wt 211 lb (95.7 kg)   SpO2 97%   BMI 35.11 kg/m   56 y.o.female presents for ER follow up - she was seen 4 days ago for N/V/D x 5 days - was referred to ED for stat labs and IV hydration due to + orthostatics and dizziness. CMET in ER indicates  Na of 125, Cl 90, BUN 26, Cr. 1.51, new mild elevation of LFTs (AST 50, ALT 60). CT was obtained without acute findings; however she is referred back for management for incidental findings - 3.5 cm nodule of L adrenal gland with recommendation for follow up with MRI or CT. Aortic atherosclerosis was also noted and discussed today. She reports she is significantly improved from last visit.  She reports she continues to not eat mcuh - experiencing a severe metallic taste in her mouth. She reports this has been progressive and ongoing since starting on BP medication - lisinopril-hctz 20/12.5 several months ago. Has an appetite and tries to eat, but feel she has to push herself to complete this. She has been able to tolerate chicken soup. She has stopped taking medication and requests to change agents- she reports metallic taste has improved some since d/c'ing. She does check her BP regularly and has been well controlled (120s/70s) - but has not since stopping taking BP. She denies HA, dizziness, chest pain/pressure, dyspnea, palpitations, LE edema, orthopnea.   She is scheduled to see GI this Friday - to discuss colonoscopy as well as ongoing nausea/GI concerns. Diarrhea has resolved. Denies fever/chills/abdominal pain, hematochezia.   CMP     Component Value Date/Time   NA 125 (L) 08/30/2017 1300   K 3.8 08/30/2017 1300   CL 90 (L) 08/30/2017 1300   CO2 17 (L) 08/30/2017 1300   GLUCOSE 118 (H) 08/30/2017 1300   BUN 26 (  H) 08/30/2017 1300   CREATININE 1.51 (H) 08/30/2017 1300   CREATININE 0.93 08/26/2017 0940   CALCIUM 8.9 08/30/2017 1300   PROT 7.0 08/30/2017 1300   ALBUMIN 3.5 08/30/2017 1300   AST 50 (H) 08/30/2017 1300   ALT 60 (H) 08/30/2017 1300   ALKPHOS 84 08/30/2017 1300   BILITOT 1.2 08/30/2017 1300   GFRNONAA 38 (L) 08/30/2017 1300   GFRNONAA 70 08/26/2017 0940   GFRAA 44 (L) 08/30/2017 1300   GFRAA 81 08/26/2017 0940     IMPRESSION: 1. No acute findings within the abdomen or  pelvis.  Indeterminate 2. Indeterminate left adrenal nodule measuring 3.5 cm. Follow-up imaging with nonemergent, adrenal protocol MRI or CT is advised. 3.  Aortic Atherosclerosis (ICD10-I70.0).  Adrenals/Urinary Tract: Normal appearance of the right adrenal gland. Nodule in the left adrenal gland is indeterminate measuring 3.5 cm and 24 HU. Unremarkable appearance of both kidneys. No mass or hydronephrosis. The urinary bladder is normal.  Past Medical History:  Diagnosis Date  . Arthritis    "knees, hands, ankles, ~ every joint" (04/08/2015)  . Basal cell carcinoma    "face, hands, chest" (04/08/2015)  . Chronic bronchitis ([place])    "get it pretty much q yr" (04/08/2015)  . Dysrhythmia    palpitations occ; SVT 09/2013 s/p adenosine  . GERD (gastroesophageal reflux disease)   . H/O hiatal hernia   . Hx of cardiovascular stress test    Lexiscan Myoview 6/16: Ejection fraction is 60% and wall motion is normal. The study is normal. There is no scar or ischemia. This is a low risk scan.  . Hyperlipidemia   . Hypertension   . Hypothyroidism   . Palpitations   . Pneumonia 2015 X 1  . PONV (postoperative nausea and vomiting)   . S/P total knee arthroplasty 02/15/2014  . Thyroid disease   . Vitamin D deficiency      Allergies  Allergen Reactions  . Darvocet [Propoxyphene N-Acetaminophen] Anaphylaxis  . Darvon [Propoxyphene Hcl] Anaphylaxis and Other (See Comments)    Airway swelling   . Propoxyphene Anaphylaxis    Throat closes up  . Doxycycline Nausea Only  . Oseltamivir Hives    Hives, elevated BP  . Tamiflu [Oseltamivir Phosphate] Other (See Comments)    Increased blood pressure Hives     Current Outpatient Medications on File Prior to Visit  Medication Sig  . acetaminophen (TYLENOL) 650 MG CR tablet Take 1,300 mg by mouth 2 (two) times daily as needed for pain.  . diphenhydrAMINE (BENADRYL) 25 MG tablet Take 25 mg by mouth daily as needed for allergies.  . fluticasone  (FLONASE) 50 MCG/ACT nasal spray Place 1 spray into both nostrils daily as needed (seasonal allergies).   . hydrocortisone-pramoxine (PROCTOFOAM [facility]) rectal foam Place 1 applicator rectally 2 (two) times daily.  Marland Kitchen leflunomide (ARAVA) 20 MG tablet Take 20 mg by mouth daily after supper.   . metroNIDAZOLE (METROGEL) 0.75 % gel Apply 1 application topically at bedtime. For rosacea  . OVER THE COUNTER MEDICATION Take 1 tablet by mouth daily as needed (acid reflux). OTC Equate brand Acid Reducer  . polyethylene glycol (MIRALAX / GLYCOLAX) packet Take 17 g by mouth daily. Mix in 8 oz liquid and drink  . sertraline (ZOLOFT) 50 MG tablet TAKE ONE TABLET BY MOUTH  DAILY  . SYNTHROID 150 MCG tablet Take 1 tablet (150 mcg total) by mouth daily before breakfast.  . varenicline (CHANTIX CONTINUING MONTH PAK) 1 MG tablet Take 1 tablet (1  mg total) by mouth 2 (two) times daily.  . VOLTAREN 1 % GEL Apply 2 g topically 4 (four) times daily. (Patient taking differently: Apply 2 g topically 4 (four) times daily as needed (joint pain). )  . clarithromycin (BIAXIN XL) 500 MG 24 hr tablet Take 500 mg by mouth 2 (two) times daily. 14 day course started 08/26/17 pm  . lisinopril-hydrochlorothiazide (ZESTORETIC) 20-12.5 MG tablet Take 1 tablet daily for BP (Patient not taking: Reported on 09/03/2017)   No current facility-administered medications on file prior to visit.     ROS: all negative except above.   Physical Exam:  BP 130/88   Pulse (!) 108   Temp 98 F (36.7 C)   Ht 5\' 5"  (1.651 m)   Wt 211 lb (95.7 kg)   SpO2 97%   BMI 35.11 kg/m   General Appearance: Well nourished, in no apparent distress. Eyes: PERRLA, EOMs, conjunctiva no swelling or erythema Sinuses: No Frontal/maxillary tenderness ENT/Mouth: Ext aud canals clear, TMs without erythema, bulging. No erythema, swelling, or exudate on post pharynx.  Tonsils not swollen or erythematous. Hearing normal.  Neck: Supple, thyroid normal.  Respiratory:  Respiratory effort normal, BS equal bilaterally without rales, rhonchi, wheezing or stridor.  Cardio: RRR with no MRGs. Brisk peripheral pulses without edema.  Abdomen: Soft, + BS.  Non tender, no guarding, rebound, hernias, masses. Lymphatics: Non tender without lymphadenopathy.  Musculoskeletal: Full ROM, 5/5 strength, normal gait.  Skin: Warm, dry without rashes, lesions, ecchymosis.  Neuro: Cranial nerves intact. Normal muscle tone, no cerebellar symptoms. Sensation intact.  Psych: Awake and oriented X 3, normal affect, Insight and Judgment appropriate.     Izora Ribas, NP 11:35 AM [facility]

## 2017-09-03 ENCOUNTER — Encounter: Payer: Self-pay | Admitting: Adult Health

## 2017-09-03 ENCOUNTER — Ambulatory Visit: Payer: 59 | Admitting: Adult Health

## 2017-09-03 VITALS — BP 130/88 | HR 108 | Temp 98.0°F | Ht 65.0 in | Wt 211.0 lb

## 2017-09-03 DIAGNOSIS — R110 Nausea: Secondary | ICD-10-CM

## 2017-09-03 DIAGNOSIS — R112 Nausea with vomiting, unspecified: Secondary | ICD-10-CM | POA: Diagnosis not present

## 2017-09-03 DIAGNOSIS — I10 Essential (primary) hypertension: Secondary | ICD-10-CM | POA: Diagnosis not present

## 2017-09-03 DIAGNOSIS — I700 Atherosclerosis of aorta: Secondary | ICD-10-CM

## 2017-09-03 DIAGNOSIS — E860 Dehydration: Secondary | ICD-10-CM | POA: Diagnosis not present

## 2017-09-03 DIAGNOSIS — R197 Diarrhea, unspecified: Secondary | ICD-10-CM | POA: Diagnosis not present

## 2017-09-03 DIAGNOSIS — E279 Disorder of adrenal gland, unspecified: Secondary | ICD-10-CM | POA: Diagnosis not present

## 2017-09-03 DIAGNOSIS — E278 Other specified disorders of adrenal gland: Secondary | ICD-10-CM

## 2017-09-03 LAB — COMPREHENSIVE METABOLIC PANEL
AG Ratio: 1.4 (calc) (ref 1.0–2.5)
ALBUMIN MSPROF: 4 g/dL (ref 3.6–5.1)
ALT: 42 U/L — AB (ref 6–29)
AST: 23 U/L (ref 10–35)
Alkaline phosphatase (APISO): 75 U/L (ref 33–130)
BILIRUBIN TOTAL: 0.5 mg/dL (ref 0.2–1.2)
BUN: 13 mg/dL (ref 7–25)
CALCIUM: 9.5 mg/dL (ref 8.6–10.4)
CO2: 25 mmol/L (ref 20–32)
CREATININE: 0.97 mg/dL (ref 0.50–1.05)
Chloride: 94 mmol/L — ABNORMAL LOW (ref 98–110)
Globulin: 2.9 g/dL (calc) (ref 1.9–3.7)
Glucose, Bld: 108 mg/dL — ABNORMAL HIGH (ref 65–99)
POTASSIUM: 4 mmol/L (ref 3.5–5.3)
Sodium: 132 mmol/L — ABNORMAL LOW (ref 135–146)
Total Protein: 6.9 g/dL (ref 6.1–8.1)

## 2017-09-03 MED ORDER — ONDANSETRON HCL 4 MG PO TABS: 4.0000 mg | ORAL_TABLET | Freq: Three times a day (TID) | ORAL | 0 refills | Status: DC | PRN

## 2017-09-03 NOTE — Patient Instructions (Addendum)
Check BP daily - let us know if you are consistently running over 130/80. We will restart on a new BP medication.    Monitor your blood pressure at home. Go to the ER if any CP, SOB, nausea, dizziness, severe HA, changes vision/speech  Goal BP:  For patients younger than 60: Goal BP < 140/90.- ideal below 130/80 For patients 60 and older: Goal BP < 150/90. For patients with diabetes: Goal BP < 140/90. Your most recent BP: BP: 130/88   Take your medications faithfully as instructed. Maintain a healthy weight. Get at least 150 minutes of aerobic exercise per week. Minimize salt intake. Minimize alcohol intake  DASH Eating Plan DASH stands for "Dietary Approaches to Stop Hypertension." The DASH eating plan is a healthy eating plan that has been shown to reduce high blood pressure (hypertension). Additional health benefits may include reducing the risk of type 2 diabetes mellitus, heart disease, and stroke. The DASH eating plan may also help with weight loss. WHAT DO I NEED TO KNOW ABOUT THE DASH EATING PLAN? For the DASH eating plan, you will follow these general guidelines:  Choose foods with a percent daily value for sodium of less than 5% (as listed on the food label).  Use salt-free seasonings or herbs instead of table salt or sea salt.  Check with your health care provider or pharmacist before using salt substitutes.  Eat lower-sodium products, often labeled as "lower sodium" or "no salt added."  Eat fresh foods.  Eat more vegetables, fruits, and low-fat dairy products.  Choose whole grains. Look for the word "whole" as the first word in the ingredient list.  Choose fish and skinless chicken or [place] more often than red meat. Limit fish, poultry, and meat to 6 oz (170 g) each day.  Limit sweets, desserts, sugars, and sugary drinks.  Choose heart-healthy fats.  Limit cheese to 1 oz (28 g) per day.  Eat more home-cooked food and less restaurant, buffet, and fast  food.  Limit fried foods.  Hum foods using methods other than frying.  Limit canned vegetables. If you do use them, rinse them well to decrease the sodium.  When eating at a restaurant, ask that your food be prepared with less salt, or no salt if possible. WHAT FOODS CAN I EAT? Seek help from a dietitian for individual calorie needs. Grains Whole grain or whole wheat bread. Brown rice. Whole grain or whole wheat pasta. Quinoa, bulgur, and whole grain cereals. Low-sodium cereals. Corn or whole wheat flour tortillas. Whole grain cornbread. Whole grain crackers. Low-sodium crackers. Vegetables Fresh or frozen vegetables (raw, steamed, roasted, or grilled). Low-sodium or reduced-sodium tomato and vegetable juices. Low-sodium or reduced-sodium tomato sauce and paste. Low-sodium or reduced-sodium canned vegetables.  Fruits All fresh, canned (in natural juice), or frozen fruits. Meat and Other Protein Products Ground beef (85% or leaner), grass-fed beef, or beef trimmed of fat. Skinless chicken or [place]. Ground chicken or [place]. Pork trimmed of fat. All fish and seafood. Eggs. Dried beans, peas, or lentils. Unsalted nuts and seeds. Unsalted canned beans. Dairy Low-fat dairy products, such as skim or 1% milk, 2% or reduced-fat cheeses, low-fat ricotta or cottage cheese, or plain low-fat yogurt. Low-sodium or reduced-sodium cheeses. Fats and Oils Tub margarines without trans fats. Light or reduced-fat mayonnaise and salad dressings (reduced sodium). Avocado. Safflower, olive, or canola oils. Natural peanut or almond butter. Other Unsalted popcorn and pretzels. The items listed above may not be a complete list of recommended foods or  beverages. Contact your dietitian for more options. WHAT FOODS ARE NOT RECOMMENDED? Grains White bread. White pasta. White rice. Refined cornbread. Bagels and croissants. Crackers that contain trans fat. Vegetables Creamed or fried vegetables. Vegetables in a  cheese sauce. Regular canned vegetables. Regular canned tomato sauce and paste. Regular tomato and vegetable juices. Fruits Dried fruits. Canned fruit in light or heavy syrup. Fruit juice. Meat and Other Protein Products Fatty cuts of meat. Ribs, chicken wings, bacon, sausage, bologna, salami, chitterlings, fatback, hot dogs, bratwurst, and packaged luncheon meats. Salted nuts and seeds. Canned beans with salt. Dairy Whole or 2% milk, cream, half-and-half, and cream cheese. Whole-fat or sweetened yogurt. Full-fat cheeses or blue cheese. Nondairy creamers and whipped toppings. Processed cheese, cheese spreads, or cheese curds. Condiments Onion and garlic salt, seasoned salt, table salt, and sea salt. Canned and packaged gravies. Worcestershire sauce. Tartar sauce. Barbecue sauce. Teriyaki sauce. Soy sauce, including reduced sodium. Steak sauce. Fish sauce. Oyster sauce. Cocktail sauce. Horseradish. Ketchup and mustard. Meat flavorings and tenderizers. Bouillon cubes. Hot sauce. Tabasco sauce. Marinades. Taco seasonings. Relishes. Fats and Oils Butter, stick margarine, lard, shortening, ghee, and bacon fat. Coconut, palm kernel, or palm oils. Regular salad dressings. Other Pickles and olives. Salted popcorn and pretzels. The items listed above may not be a complete list of foods and beverages to avoid. Contact your dietitian for more information. WHERE CAN I FIND MORE INFORMATION? National Heart, Lung, and Blood Institute: travelstabloid.com Document Released: 07/19/2011 Document Revised: 12/14/2013 Document Reviewed: 06/03/2013 [facility] Patient Information 2015 [place], [place]. This information is not intended to replace advice given to you by your health care provider. Make sure you discuss any questions you have with your health care provider.

## 2017-09-06 ENCOUNTER — Ambulatory Visit: Payer: 59 | Admitting: Gastroenterology

## 2017-09-09 ENCOUNTER — Encounter: Payer: Self-pay | Admitting: Gastroenterology

## 2017-09-09 ENCOUNTER — Ambulatory Visit: Payer: 59 | Admitting: Gastroenterology

## 2017-09-09 VITALS — BP 130/96 | HR 120 | Ht 65.0 in | Wt 216.0 lb

## 2017-09-09 DIAGNOSIS — R110 Nausea: Secondary | ICD-10-CM

## 2017-09-09 DIAGNOSIS — Z1211 Encounter for screening for malignant neoplasm of colon: Secondary | ICD-10-CM | POA: Insufficient documentation

## 2017-09-09 DIAGNOSIS — R634 Abnormal weight loss: Secondary | ICD-10-CM

## 2017-09-09 MED ORDER — PEG-KCL-NACL-NASULF-NA ASC-C 140 G PO SOLR: 1.0000 | ORAL | 0 refills | Status: DC

## 2017-09-09 NOTE — Patient Instructions (Signed)

## 2017-09-09 NOTE — Progress Notes (Signed)
09/09/2017 Sherry Dennis [id number] 12/03/1960   HISTORY OF PRESENT ILLNESS: This is a 57 year old female who is known to me from earlier this month when I saw her for issues with thrombosed hemorrhoids.  She was seen by [facility] surgery and had those evacuated.  She says that those have finally cleared up.  She was scheduled for screening colonoscopy with Dr. Silverio Decamp, but at pre-visit she reported nausea, vomiting, and weight loss so she was placed on my schedule to be seen.  She tells me that this nausea has been present since November.  She says that she constantly has some degree of nausea.  Has had some vomiting, but does not occur regularly.  She reports initially about a 35 pound weight loss, but says that she has gained about 10 pounds or so back.  According to our scale she is down about 5 pounds from when I saw her earlier this month.  She denies any abdominal pain.  Denies any rectal bleeding.  She reports only very occasional acid reflux and only uses omeprazole OTC 20 mg as needed.  She says that sometimes she goes 6 months without really needed to take it.  She had a CT scan of the abdomen and pelvis 10 days ago that showed only indeterminate left adrenal nodule measuring 3.5 cm for which they are recommending follow-up imaging with nonemergent adrenal protocol MRI or CT.  Otherwise unremarkable.  CBC and TSH were normal.  Lipase very slightly elevated at 67.  LFTs fairly unremarkable.  On January 22 she had an ALT of 42, but were otherwise normal.   Past Medical History:  Diagnosis Date  . Arthritis    "knees, hands, ankles, ~ every joint" (04/08/2015)  . Basal cell carcinoma    "face, hands, chest" (04/08/2015)  . Chronic bronchitis ([place])    "get it pretty much q yr" (04/08/2015)  . Dysrhythmia    palpitations occ; SVT 09/2013 s/p adenosine  . GERD (gastroesophageal reflux disease)   . H/O hiatal hernia   . Hx of cardiovascular stress test    Lexiscan Myoview 6/16:  Ejection fraction is 60% and wall motion is normal. The study is normal. There is no scar or ischemia. This is a low risk scan.  . Hyperlipidemia   . Hypertension   . Hypothyroidism   . Palpitations   . Pneumonia 2015 X 1  . PONV (postoperative nausea and vomiting)   . S/P total knee arthroplasty 02/15/2014  . Thyroid disease   . Vitamin D deficiency    Past Surgical History:  Procedure Laterality Date  . BASAL CELL CARCINOMA EXCISION  2014   "off my chest"  . BLADDER REPAIR  1993   "lasered the holes shut"  . CARDIAC SURGERY    . CARPAL TUNNEL RELEASE Right 1990  . ELECTROPHYSIOLOGIC STUDY N/A 04/08/2015   Procedure: SVT Ablation;  Surgeon: Evans Lance, MD;  Location: [facility];  Service: Cardiovascular;  Laterality: N/A;  . ENDOMETRIAL ABLATION  2009  . INCONTINENCE SURGERY  2010  . JOINT REPLACEMENT    . KNEE ARTHROSCOPY Right 2013  . KNEE ARTHROSCOPY    . PARTIAL KNEE ARTHROPLASTY Right 02/15/2014   Procedure: RIGHT UNICOMPARTMENTAL KNEE (MEDIAL COMPARTMENT);  Surgeon: Vickey Huger, MD;  Location: [facility];  Service: Orthopedics;  Laterality: Right;  . TUBAL LIGATION  1987    reports that she has been smoking cigarettes.  She has a 60.00 pack-year smoking history. she has  never used smokeless tobacco. She reports that she does not drink alcohol or use drugs. family history includes Atrial fibrillation in her mother; Breast cancer in her paternal aunt; COPD in her father; Diabetes in her father; Emphysema in her father; Heart attack in her maternal grandmother; Heart disease in her maternal grandfather, paternal grandfather, and paternal grandmother; Hyperlipidemia in her maternal grandfather, paternal grandfather, and paternal grandmother; Hypertension in her mother; Stroke in her maternal grandmother. Allergies  Allergen Reactions  . Darvocet [Propoxyphene N-Acetaminophen] Anaphylaxis  . Darvon [Propoxyphene Hcl] Anaphylaxis and Other (See Comments)    Airway swelling     . Propoxyphene Anaphylaxis    Throat closes up  . Doxycycline Nausea Only  . Oseltamivir Hives    Hives, elevated BP  . Tamiflu [Oseltamivir Phosphate] Other (See Comments)    Increased blood pressure Hives       Outpatient Encounter Medications as of 09/09/2017  Medication Sig  . acetaminophen (TYLENOL) 650 MG CR tablet Take 1,300 mg by mouth 2 (two) times daily as needed for pain.  . diphenhydrAMINE (BENADRYL) 25 MG tablet Take 25 mg by mouth daily as needed for allergies.  . fluticasone (FLONASE) 50 MCG/ACT nasal spray Place 1 spray into both nostrils daily as needed (seasonal allergies).   Marland Kitchen leflunomide (ARAVA) 20 MG tablet Take 20 mg by mouth daily after supper.   . metroNIDAZOLE (METROGEL) 0.75 % gel Apply 1 application topically at bedtime. For rosacea  . ondansetron (ZOFRAN) 4 MG tablet Take 1 tablet (4 mg total) by mouth every 8 (eight) hours as needed for up to 6 doses for nausea or vomiting.  Marland Kitchen OVER THE COUNTER MEDICATION Take 1 tablet by mouth daily as needed (acid reflux). OTC Equate brand Acid Reducer  . polyethylene glycol (MIRALAX / GLYCOLAX) packet Take 17 g by mouth daily. Mix in 8 oz liquid and drink  . SYNTHROID 150 MCG tablet Take 1 tablet (150 mcg total) by mouth daily before breakfast.  . VOLTAREN 1 % GEL Apply 2 g topically 4 (four) times daily. (Patient taking differently: Apply 2 g topically 4 (four) times daily as needed (joint pain). )  . varenicline (CHANTIX CONTINUING MONTH PAK) 1 MG tablet Take 1 tablet (1 mg total) by mouth 2 (two) times daily. (Patient not taking: Reported on 09/09/2017)  . [DISCONTINUED] clarithromycin (BIAXIN XL) 500 MG 24 hr tablet Take 500 mg by mouth 2 (two) times daily. 14 day course started 08/26/17 pm  . [DISCONTINUED] hydrocortisone-pramoxine (PROCTOFOAM HC) rectal foam Place 1 applicator rectally 2 (two) times daily.  . [DISCONTINUED] sertraline (ZOLOFT) 50 MG tablet TAKE ONE TABLET BY MOUTH  DAILY   No facility-administered  encounter medications on file as of 09/09/2017.      REVIEW OF SYSTEMS  : All other systems reviewed and negative except where noted in the History of Present Illness.   PHYSICAL EXAM: BP (!) 130/96   Pulse (!) 120   Ht 5\' 5"  (1.651 m)   Wt 216 lb (98 kg)   BMI 35.94 kg/m  General: Well developed white female in no acute distress Head: Normocephalic and atraumatic Eyes:  Sclerae anicteric, conjunctiva pink. Ears: Normal auditory acuity Lungs: Clear throughout to auscultation; no increased WOB. Heart: Tachy, but regular rhythm.  No M/R/G. Abdomen: Soft, non-distended.  BS present.  Non-tender. Rectal:  Will be done at the time of colonoscopy. Musculoskeletal: Symmetrical with no gross deformities  Skin: No lesions on visible extremities Extremities: No edema  Neurological: Alert oriented x  4, grossly non-focal Psychological:  Alert and cooperative. Normal mood and affect  ASSESSMENT AND PLAN: *Screening colonoscopy:  Was already scheduled with Dr. Silverio Decamp but told PV that she was having nausea and vomiting so was put on for OV.  Will proceed with colonoscopy. *Nausea, some vomiting, and weight loss:  Reports initially about 35 pounds but says that she has gained about 10 pounds back again and according to our scales she is down about 5 pounds from earlier this month.  ? If some of her nausea and vomiting is due to reflux.  Takes only occasional OTC omeprazole right now.  Will schedule for EGD as well.  Will wait to put her on daily PPI for now until we see the results of EGD.  **The risks, benefits, and alternatives to EGD and colonoscopy were discussed with the patient and she consents to proceed.   CC:  Unk Pinto, MD

## 2017-09-10 NOTE — Progress Notes (Signed)
Reviewed and agree with documentation and assessment and plan. K. Veena Miko Markwood , MD   

## 2017-09-11 ENCOUNTER — Other Ambulatory Visit: Payer: Self-pay | Admitting: Adult Health

## 2017-09-11 ENCOUNTER — Encounter: Payer: Self-pay | Admitting: Gastroenterology

## 2017-09-11 ENCOUNTER — Telehealth: Payer: Self-pay

## 2017-09-11 ENCOUNTER — Telehealth: Payer: Self-pay | Admitting: Gastroenterology

## 2017-09-11 ENCOUNTER — Ambulatory Visit
Admission: RE | Admit: 2017-09-11 | Discharge: 2017-09-11 | Disposition: A | Discharge status: Home / Self Care | Payer: 59 | Source: Ambulatory Visit | Attending: Adult Health | Admitting: Adult Health

## 2017-09-11 DIAGNOSIS — E278 Other specified disorders of adrenal gland: Secondary | ICD-10-CM

## 2017-09-11 DIAGNOSIS — I10 Essential (primary) hypertension: Secondary | ICD-10-CM

## 2017-09-11 DIAGNOSIS — E279 Disorder of adrenal gland, unspecified: Principal | ICD-10-CM

## 2017-09-11 MED ORDER — OLMESARTAN MEDOXOMIL 40 MG PO TABS: ORAL_TABLET | ORAL | 2 refills | Status: DC

## 2017-09-11 NOTE — Telephone Encounter (Signed)
Patient states insurance will not cover prep and would like something else called in to [facility]. Pt had ov with Jessica 1.28.19.

## 2017-09-11 NOTE — Telephone Encounter (Signed)
BP is staying elevated, above 140. Was at a doctor's appointment yesterday and systolic was 517. She was told to call us if BP continued to be elevated that we would prescribe something.

## 2017-09-11 NOTE — Telephone Encounter (Signed)
Patient notified

## 2017-09-11 NOTE — Telephone Encounter (Signed)
Will send in olemesartan 40 mg to take 1/2- 1 tab for BP.

## 2017-09-11 NOTE — Telephone Encounter (Signed)
Spoke to patient and let her know she could pick up a sample in the office. She verbalized understanding.

## 2017-09-11 NOTE — Telephone Encounter (Signed)
Please call and let patient know I am sending in a new medication to her pharmacy on file. Continue to monitor BPs, goal is below 130/80

## 2017-09-13 ENCOUNTER — Encounter: Payer: Self-pay | Admitting: Adult Health

## 2017-09-16 ENCOUNTER — Encounter: Payer: Commercial Managed Care - HMO | Admitting: Gastroenterology

## 2017-09-25 ENCOUNTER — Other Ambulatory Visit: Payer: Self-pay

## 2017-09-25 ENCOUNTER — Ambulatory Visit (INDEPENDENT_AMBULATORY_CARE_PROVIDER_SITE_OTHER): Payer: 59 | Admitting: Gastroenterology

## 2017-09-25 ENCOUNTER — Encounter: Payer: Self-pay | Admitting: Gastroenterology

## 2017-09-25 VITALS — BP 166/96 | HR 79 | Temp 98.4°F | Resp 24 | Ht 65.0 in | Wt 216.0 lb

## 2017-09-25 DIAGNOSIS — Z1211 Encounter for screening for malignant neoplasm of colon: Secondary | ICD-10-CM

## 2017-09-25 DIAGNOSIS — D123 Benign neoplasm of transverse colon: Secondary | ICD-10-CM

## 2017-09-25 DIAGNOSIS — D122 Benign neoplasm of ascending colon: Secondary | ICD-10-CM

## 2017-09-25 DIAGNOSIS — R1013 Epigastric pain: Secondary | ICD-10-CM | POA: Diagnosis not present

## 2017-09-25 DIAGNOSIS — D125 Benign neoplasm of sigmoid colon: Secondary | ICD-10-CM

## 2017-09-25 DIAGNOSIS — R110 Nausea: Secondary | ICD-10-CM | POA: Diagnosis not present

## 2017-09-25 DIAGNOSIS — D120 Benign neoplasm of cecum: Secondary | ICD-10-CM

## 2017-09-25 DIAGNOSIS — K635 Polyp of colon: Secondary | ICD-10-CM | POA: Diagnosis not present

## 2017-09-25 MED ORDER — SODIUM CHLORIDE 0.9 % IV SOLN: 500.0000 mL | Freq: Once | INTRAVENOUS | Status: DC

## 2017-09-25 NOTE — Progress Notes (Signed)
Pt's states no medical or surgical changes since previsit or office visit. 

## 2017-09-25 NOTE — Progress Notes (Signed)
Office will call to schedule colonoscopy at [facility] hospital at next available appointment.

## 2017-09-25 NOTE — Op Note (Signed)
[place] Patient Name: Sherry Dennis Procedure Date: 09/25/2017 1:31 PM MRN: [id number] Endoscopist: Mauri Pole , MD Age: 57 Referring MD:  Date of Birth: 11-19-1960 Gender: Female Account #: [id number] Procedure:                Colonoscopy Indications:              Screening for colorectal malignant neoplasm, This                            is the patient's first colonoscopy Medicines:                Monitored Anesthesia Care Procedure:                Pre-Anesthesia Assessment:                           - Prior to the procedure, a History and Physical                            was performed, and patient medications and                            allergies were reviewed. The patient's tolerance of                            previous anesthesia was also reviewed. The risks                            and benefits of the procedure and the sedation                            options and risks were discussed with the patient.                            All questions were answered, and informed consent                            was obtained. Prior Anticoagulants: The patient has                            taken no previous anticoagulant or antiplatelet                            agents. ASA Grade Assessment: II - A patient with                            mild systemic disease. After reviewing the risks                            and benefits, the patient was deemed in                            satisfactory condition to undergo the procedure.  After obtaining informed consent, the colonoscope                            was passed under direct vision. Throughout the                            procedure, the patient's blood pressure, pulse, and                            oxygen saturations were monitored continuously. The                            Colonoscope was introduced through the anus and                            advanced to the the  cecum, identified by                            appendiceal orifice and ileocecal valve. The                            colonoscopy was performed without difficulty. The                            patient tolerated the procedure well. The quality                            of the bowel preparation was excellent. The                            ileocecal valve, appendiceal orifice, and rectum                            were photographed. Scope In: 1:49:57 PM Scope Out: 2:32:26 PM Scope Withdrawal Time: 0 hours 24 minutes 44 seconds  Total Procedure Duration: 0 hours 42 minutes 29 seconds  Findings:                 The perianal and digital rectal examinations were                            normal.                           A 4 mm polyp was found in the ascending colon. The                            polyp was sessile. The polyp was removed with a                            cold snare. Resection and retrieval were complete.                           A 2 mm polyp was found in the transverse colon. The  polyp was sessile. The polyp was removed with a                            cold biopsy forceps. Resection and retrieval were                            complete.                           A 15 mm polyp was found in the cecum. The polyp was                            sessile. The polyp was removed with a hot snare.                            Resection and retrieval were complete.                           A 25 mm polyp was found in the cecum. The polyp was                            granular lateral spreading. Not resected                           A 18 mm polyp was found in the sigmoid colon. The                            polyp was semi-pedunculated, umbilicated.                            Preparations were made for mucosal resection. 2 mL                            of saline was injected with adequate lift of the                            lesion from the muscularis  propria. Snare mucosal                            resection was performed. A 7 mm area was resected.                            Resection and retrieval were complete. There was no                            bleeding during the maneuver. To close a defect                            after mucosal resection, one hemostatic clip was                            successfully placed (MR conditional). There was no  bleeding at the end of the procedure.                           Non-bleeding internal hemorrhoids were found during                            retroflexion. The hemorrhoids were small. Complications:            No immediate complications. Estimated Blood Loss:     Estimated blood loss was minimal. Impression:               - One 4 mm polyp in the ascending colon, removed                            with a cold snare. Resected and retrieved.                           - One 2 mm polyp in the transverse colon, removed                            with a cold biopsy forceps. Resected and retrieved.                           - One 15 mm polyp in the cecum, removed with a hot                            snare. Resected and retrieved.                           - One 25 mm polyp in the cecum, not resected.                           - One 18 mm polyp in the sigmoid colon, removed                            with mucosal resection. Resected and retrieved.                            Clip (MR conditional) was placed.                           - Non-bleeding internal hemorrhoids.                           - Mucosal resection was performed. Resection and                            retrieval were complete. Recommendation:           - Patient has a contact number available for                            emergencies. The signs and symptoms of potential  delayed complications were discussed with the                            patient. Return to normal  activities tomorrow.                            Written discharge instructions were provided to the                            patient.                           - Resume previous diet.                           - Continue present medications.                           - Await pathology results.                           - Repeat colonoscopy at the next available                            appointment for removal (EMR) of large cecal polyp,                            to be scheduled at [facility] endoscopy unit. Mauri Pole, MD 09/25/2017 2:44:49 PM This report has been signed electronically.

## 2017-09-25 NOTE — Patient Instructions (Signed)
Discharge instructions given. Handout on polyps. Biopsies taken. Resume previous medications. YOU HAD AN ENDOSCOPIC PROCEDURE TODAY AT THE [facility]:   Refer to the procedure report that was given to you for any specific questions about what was found during the examination.  If the procedure report does not answer your questions, please call your gastroenterologist to clarify.  If you requested that your care partner not be given the details of your procedure findings, then the procedure report has been included in a sealed envelope for you to review at your convenience later.  YOU SHOULD EXPECT: Some feelings of bloating in the abdomen. Passage of more gas than usual.  Walking can help get rid of the air that was put into your GI tract during the procedure and reduce the bloating. If you had a lower endoscopy (such as a colonoscopy or flexible sigmoidoscopy) you may notice spotting of blood in your stool or on the toilet paper. If you underwent a bowel prep for your procedure, you may not have a normal bowel movement for a few days.  Please Note:  You might notice some irritation and congestion in your nose or some drainage.  This is from the oxygen used during your procedure.  There is no need for concern and it should clear up in a day or so.  SYMPTOMS TO REPORT IMMEDIATELY:   Following lower endoscopy (colonoscopy or flexible sigmoidoscopy):  Excessive amounts of blood in the stool  Significant tenderness or worsening of abdominal pains  Swelling of the abdomen that is new, acute  Fever of 100F or higher   For urgent or emergent issues, a gastroenterologist can be reached at any hour by calling [phone].   DIET:  We do recommend a small meal at first, but then you may proceed to your regular diet.  Drink plenty of fluids but you should avoid alcoholic beverages for 24 hours.  ACTIVITY:  You should plan to take it easy for the rest of today and you should NOT DRIVE  or use heavy machinery until tomorrow (because of the sedation medicines used during the test).    FOLLOW UP: Our staff will call the number listed on your records the next business day following your procedure to check on you and address any questions or concerns that you may have regarding the information given to you following your procedure. If we do not reach you, we will leave a message.  However, if you are feeling well and you are not experiencing any problems, there is no need to return our call.  We will assume that you have returned to your regular daily activities without incident.  If any biopsies were taken you will be contacted by phone or by letter within the next 1-3 weeks.  Please call us at [phone] if you have not heard about the biopsies in 3 weeks.    SIGNATURES/CONFIDENTIALITY: You and/or your care partner have signed paperwork which will be entered into your electronic medical record.  These signatures attest to the fact that that the information above on your After Visit Summary has been reviewed and is understood.  Full responsibility of the confidentiality of this discharge information lies with you and/or your care-partner. 

## 2017-09-25 NOTE — Progress Notes (Signed)
Called to room to assist during endoscopic procedure.  Patient ID and intended procedure confirmed with present staff. Received instructions for my participation in the procedure from the performing physician.  

## 2017-09-25 NOTE — Op Note (Signed)
[facility] Patient Name: Sherry Dennis Procedure Date: 09/25/2017 1:31 PM MRN: [id number] Endoscopist: Mauri Pole , MD Age: 57 Referring MD:  Date of Birth: Jan 03, 1961 Gender: Female Account #: [id number] Procedure:                Upper GI endoscopy Indications:              Dyspepsia, Nausea, Persistent vomiting of unknown                            cause Medicines:                Monitored Anesthesia Care Procedure:                Pre-Anesthesia Assessment:                           - Prior to the procedure, a History and Physical                            was performed, and patient medications and                            allergies were reviewed. The patient's tolerance of                            previous anesthesia was also reviewed. The risks                            and benefits of the procedure and the sedation                            options and risks were discussed with the patient.                            All questions were answered, and informed consent                            was obtained. Prior Anticoagulants: The patient has                            taken no previous anticoagulant or antiplatelet                            agents. ASA Grade Assessment: II - A patient with                            mild systemic disease. After reviewing the risks                            and benefits, the patient was deemed in                            satisfactory condition to undergo the procedure.  After obtaining informed consent, the endoscope was                            passed under direct vision. Throughout the                            procedure, the patient's blood pressure, pulse, and                            oxygen saturations were monitored continuously. The                            Model GIF-HQ190 [id number]) scope was introduced                            through the mouth, and advanced to the second  part                            of duodenum. The upper GI endoscopy was                            accomplished without difficulty. The patient                            tolerated the procedure well. Scope In: Scope Out: Findings:                 The Z-line was regular and was found 35 cm from the                            incisors.                           Esophagogastric landmarks were identified: the                            gastroesophageal junction was found at 35 cm from                            the incisors.                           No gross lesions were noted in the entire examined                            stomach. Biopsies were taken with a cold forceps                            for Helicobacter pylori testing using CLOtest.                           The examined duodenum was normal. Complications:            No immediate complications. Estimated Blood Loss:     Estimated blood loss was minimal. Impression:               -  Z-line regular, 35 cm from the incisors.                           - Esophagogastric landmarks identified.                           - No gross lesions in the stomach. Biopsied.                           - Normal examined duodenum. Recommendation:           - Patient has a contact number available for                            emergencies. The signs and symptoms of potential                            delayed complications were discussed with the                            patient. Return to normal activities tomorrow.                            Written discharge instructions were provided to the                            patient.                           - Resume previous diet.                           - Continue present medications.                           - Await clotest/H.pylori test results. Mauri Pole, MD 09/25/2017 2:38:12 PM This report has been signed electronically.

## 2017-09-25 NOTE — Progress Notes (Signed)
A and O x3. Report to RN. Tolerated MAC anesthesia well.Teeth unchanged after procedure.

## 2017-09-26 ENCOUNTER — Other Ambulatory Visit: Payer: Self-pay

## 2017-09-26 ENCOUNTER — Telehealth: Payer: Self-pay | Admitting: *Deleted

## 2017-09-26 DIAGNOSIS — Z860100 Personal history of colon polyps, unspecified: Secondary | ICD-10-CM

## 2017-09-26 LAB — HELICOBACTER PYLORI SCREEN-BIOPSY: UREASE: POSITIVE — AB

## 2017-09-26 NOTE — Telephone Encounter (Signed)
  Follow up Call-  Call back number 09/25/2017  Post procedure Call Back phone  # [phone]  Permission to leave phone message Yes  Some recent data might be hidden     Patient questions:  Do you have a fever, pain , or abdominal swelling? No. Pain Score  0 *  Have you tolerated food without any problems? Yes.    Have you been able to return to your normal activities? Yes.    Do you have any questions about your discharge instructions: Diet   No. Medications  No. Follow up visit  Yes.    Do you have questions or concerns about your Care? No.  Actions: * If pain score is 4 or above: No action needed, pain <4.  Pt had questions about when her colonoscopy at [facility] will be.  I told her that Dr. Woodward Ku office nurse would be calling to set this up.

## 2017-09-27 ENCOUNTER — Other Ambulatory Visit: Payer: Self-pay

## 2017-09-27 MED ORDER — BIS SUBCIT-METRONID-TETRACYC 140-125-125 MG PO CAPS: 3.0000 | ORAL_CAPSULE | Freq: Three times a day (TID) | ORAL | 0 refills | Status: DC

## 2017-09-27 MED ORDER — OMEPRAZOLE 40 MG PO CPDR: 40.0000 mg | DELAYED_RELEASE_CAPSULE | Freq: Two times a day (BID) | ORAL | 0 refills | Status: DC

## 2017-09-30 ENCOUNTER — Telehealth: Payer: Self-pay

## 2017-09-30 NOTE — Telephone Encounter (Signed)
-----   Message from Mauri Pole, MD sent at 09/27/2017  6:05 AM EST ----- H.pylori treatment H.pylori positive. Please send prescription for Pylera or Bismuth 524mg  and Flagyl 250mg  1 tablet four times daily, Doxycycline 100mg  1 capsule Twice daily X 10 days along with high dose PPI (omeprazole or Nexium 40mg  BID. Will need to confirm eradication in 6 weeks by checking H.pylori stool Ag, off PPI for 2 weeks prior to the test.

## 2017-09-30 NOTE — Telephone Encounter (Signed)
Notes recorded by Greggory Keen, LPN on 05/03/1940 at 74:08 AM EST Patient is aware of her results. Now waiting to see what her insurance will allow her to have. ------  Notes recorded by Greggory Keen, LPN on 1/44/8185 at 63:14 AM EST Left a message to call back. Pylera sent to [facility] to investigate coverage. Omeprazole 40 mg BID also sent.

## 2017-09-30 NOTE — Telephone Encounter (Signed)
im watching for a prior auth for this med for this patient but I havent seen one yet

## 2017-10-01 ENCOUNTER — Encounter: Payer: Self-pay | Admitting: Gastroenterology

## 2017-10-05 DIAGNOSIS — M069 Rheumatoid arthritis, unspecified: Secondary | ICD-10-CM | POA: Diagnosis not present

## 2017-10-10 DIAGNOSIS — L739 Follicular disorder, unspecified: Secondary | ICD-10-CM | POA: Diagnosis not present

## 2017-10-10 DIAGNOSIS — L719 Rosacea, unspecified: Secondary | ICD-10-CM | POA: Diagnosis not present

## 2017-10-10 DIAGNOSIS — B078 Other viral warts: Secondary | ICD-10-CM | POA: Diagnosis not present

## 2017-10-10 DIAGNOSIS — L821 Other seborrheic keratosis: Secondary | ICD-10-CM | POA: Diagnosis not present

## 2017-10-10 DIAGNOSIS — D485 Neoplasm of uncertain behavior of skin: Secondary | ICD-10-CM | POA: Diagnosis not present

## 2017-10-10 DIAGNOSIS — C44511 Basal cell carcinoma of skin of breast: Secondary | ICD-10-CM | POA: Diagnosis not present

## 2017-10-10 DIAGNOSIS — Z85828 Personal history of other malignant neoplasm of skin: Secondary | ICD-10-CM | POA: Diagnosis not present

## 2017-10-25 DIAGNOSIS — M25561 Pain in right knee: Secondary | ICD-10-CM | POA: Diagnosis not present

## 2017-10-25 DIAGNOSIS — R945 Abnormal results of liver function studies: Secondary | ICD-10-CM | POA: Diagnosis not present

## 2017-10-25 DIAGNOSIS — M069 Rheumatoid arthritis, unspecified: Secondary | ICD-10-CM | POA: Diagnosis not present

## 2017-11-05 DIAGNOSIS — M0579 Rheumatoid arthritis with rheumatoid factor of multiple sites without organ or systems involvement: Secondary | ICD-10-CM | POA: Diagnosis not present

## 2017-11-05 DIAGNOSIS — M5030 Other cervical disc degeneration, unspecified cervical region: Secondary | ICD-10-CM | POA: Diagnosis not present

## 2017-11-13 ENCOUNTER — Other Ambulatory Visit: Payer: Self-pay

## 2017-11-13 ENCOUNTER — Encounter (HOSPITAL_COMMUNITY): Payer: Self-pay | Admitting: Emergency Medicine

## 2017-11-14 DIAGNOSIS — M25512 Pain in left shoulder: Secondary | ICD-10-CM | POA: Diagnosis not present

## 2017-11-14 DIAGNOSIS — M25562 Pain in left knee: Secondary | ICD-10-CM | POA: Diagnosis not present

## 2017-11-15 ENCOUNTER — Other Ambulatory Visit: Payer: Self-pay

## 2017-11-15 ENCOUNTER — Other Ambulatory Visit: Payer: Self-pay | Admitting: *Deleted

## 2017-11-15 ENCOUNTER — Ambulatory Visit (AMBULATORY_SURGERY_CENTER): Payer: Self-pay

## 2017-11-15 VITALS — Ht 65.5 in | Wt 217.0 lb

## 2017-11-15 DIAGNOSIS — Z86010 Personal history of colonic polyps: Secondary | ICD-10-CM

## 2017-11-15 DIAGNOSIS — Z8619 Personal history of other infectious and parasitic diseases: Secondary | ICD-10-CM

## 2017-11-15 DIAGNOSIS — A048 Other specified bacterial intestinal infections: Secondary | ICD-10-CM

## 2017-11-15 NOTE — Progress Notes (Signed)
No egg or soy allergy known to patient  No issues with past sedation with any surgeries  or procedures, no intubation problems , PONV No diet pills per patient No home 02 use per patient  No blood thinners per patient  Pt denies issues with constipation  No A fib or A flutter  EMMI video sent to pt's e mail

## 2017-11-18 ENCOUNTER — Other Ambulatory Visit: Payer: 59

## 2017-11-18 DIAGNOSIS — Z8619 Personal history of other infectious and parasitic diseases: Secondary | ICD-10-CM | POA: Diagnosis not present

## 2017-11-19 LAB — HELICOBACTER PYLORI  SPECIAL ANTIGEN
MICRO NUMBER:: 90429785
SPECIMEN QUALITY: ADEQUATE

## 2017-11-25 ENCOUNTER — Encounter (HOSPITAL_COMMUNITY)
Admission: RE | Disposition: A | Discharge status: Home / Self Care | Payer: Self-pay | Source: Ambulatory Visit | Attending: Gastroenterology

## 2017-11-25 ENCOUNTER — Ambulatory Visit (HOSPITAL_COMMUNITY): Payer: 59 | Admitting: Anesthesiology

## 2017-11-25 ENCOUNTER — Other Ambulatory Visit: Payer: Self-pay

## 2017-11-25 ENCOUNTER — Ambulatory Visit (HOSPITAL_COMMUNITY)
Admission: RE | Admit: 2017-11-25 | Discharge: 2017-11-25 | Disposition: A | Discharge status: Home / Self Care | Payer: 59 | Source: Ambulatory Visit | Attending: Gastroenterology | Admitting: Gastroenterology

## 2017-11-25 ENCOUNTER — Encounter (HOSPITAL_COMMUNITY): Payer: Self-pay | Admitting: *Deleted

## 2017-11-25 DIAGNOSIS — Z79890 Hormone replacement therapy (postmenopausal): Secondary | ICD-10-CM | POA: Insufficient documentation

## 2017-11-25 DIAGNOSIS — I10 Essential (primary) hypertension: Secondary | ICD-10-CM | POA: Insufficient documentation

## 2017-11-25 DIAGNOSIS — M1990 Unspecified osteoarthritis, unspecified site: Secondary | ICD-10-CM | POA: Insufficient documentation

## 2017-11-25 DIAGNOSIS — Z881 Allergy status to other antibiotic agents status: Secondary | ICD-10-CM | POA: Insufficient documentation

## 2017-11-25 DIAGNOSIS — F17210 Nicotine dependence, cigarettes, uncomplicated: Secondary | ICD-10-CM | POA: Insufficient documentation

## 2017-11-25 DIAGNOSIS — Z85828 Personal history of other malignant neoplasm of skin: Secondary | ICD-10-CM | POA: Diagnosis not present

## 2017-11-25 DIAGNOSIS — K219 Gastro-esophageal reflux disease without esophagitis: Secondary | ICD-10-CM | POA: Diagnosis not present

## 2017-11-25 DIAGNOSIS — D120 Benign neoplasm of cecum: Secondary | ICD-10-CM

## 2017-11-25 DIAGNOSIS — Z96651 Presence of right artificial knee joint: Secondary | ICD-10-CM | POA: Diagnosis not present

## 2017-11-25 DIAGNOSIS — Z79899 Other long term (current) drug therapy: Secondary | ICD-10-CM | POA: Diagnosis not present

## 2017-11-25 DIAGNOSIS — E039 Hypothyroidism, unspecified: Secondary | ICD-10-CM | POA: Insufficient documentation

## 2017-11-25 DIAGNOSIS — E785 Hyperlipidemia, unspecified: Secondary | ICD-10-CM | POA: Insufficient documentation

## 2017-11-25 DIAGNOSIS — K635 Polyp of colon: Secondary | ICD-10-CM | POA: Insufficient documentation

## 2017-11-25 DIAGNOSIS — Z888 Allergy status to other drugs, medicaments and biological substances status: Secondary | ICD-10-CM | POA: Diagnosis not present

## 2017-11-25 DIAGNOSIS — Z86010 Personal history of colonic polyps: Secondary | ICD-10-CM | POA: Diagnosis not present

## 2017-11-25 HISTORY — PX: COLONOSCOPY WITH PROPOFOL: SHX5780

## 2017-11-25 SURGERY — COLONOSCOPY WITH PROPOFOL
Anesthesia: General

## 2017-11-25 MED ORDER — ONDANSETRON HCL 4 MG/2ML IJ SOLN
INTRAMUSCULAR | Status: DC | PRN
  Administered 2017-11-25: 4 mg via INTRAVENOUS

## 2017-11-25 MED ORDER — PROPOFOL 10 MG/ML IV BOLUS
INTRAVENOUS | Status: AC
  Filled 2017-11-25: qty 40

## 2017-11-25 MED ORDER — PROPOFOL 500 MG/50ML IV EMUL
INTRAVENOUS | Status: DC | PRN
  Administered 2017-11-25: 150 ug/kg/min via INTRAVENOUS

## 2017-11-25 MED ORDER — METHYLENE BLUE 0.5 % INJ SOLN
INTRAVENOUS | Status: DC | PRN
  Administered 2017-11-25: 2.5 mg via SUBMUCOSAL

## 2017-11-25 MED ORDER — LACTATED RINGERS IV SOLN
INTRAVENOUS | Status: DC
Start: 2017-11-25 — End: 2017-11-25
  Administered 2017-11-25: 07:00:00 via INTRAVENOUS

## 2017-11-25 MED ORDER — SODIUM CHLORIDE 0.9 % IV SOLN: INTRAVENOUS | Status: DC

## 2017-11-25 MED ORDER — PROPOFOL 10 MG/ML IV BOLUS
INTRAVENOUS | Status: DC | PRN
  Administered 2017-11-25: 20 mg via INTRAVENOUS
  Administered 2017-11-25: 50 mg via INTRAVENOUS

## 2017-11-25 MED ORDER — PROPOFOL 10 MG/ML IV BOLUS
INTRAVENOUS | Status: AC
  Filled 2017-11-25: qty 20

## 2017-11-25 MED ORDER — PROPOFOL 10 MG/ML IV BOLUS
INTRAVENOUS | Status: AC
Start: 2017-11-25 — End: 2017-11-25
  Filled 2017-11-25: qty 20

## 2017-11-25 SURGICAL SUPPLY — 22 items

## 2017-11-25 NOTE — Anesthesia Postprocedure Evaluation (Signed)
Anesthesia Post Note  Patient: Sherry Dennis  Procedure(s) Performed: COLONOSCOPY WITH PROPOFOL (N/A )     Patient location during evaluation: Endoscopy Anesthesia Type: MAC Level of consciousness: awake and alert Pain management: pain level controlled Vital Signs Assessment: post-procedure vital signs reviewed and stable Respiratory status: spontaneous breathing, nonlabored ventilation, respiratory function stable and patient connected to nasal cannula oxygen Cardiovascular status: stable and blood pressure returned to baseline Postop Assessment: no apparent nausea or vomiting Anesthetic complications: no    Last Vitals:  Vitals:   11/25/17 0941 11/25/17 0951  BP: 129/84 (!) 151/93  Pulse: 75 67  Resp: 15 (!) 21  Temp:    SpO2: 96% 98%    Last Pain:  Vitals:   11/25/17 0951  TempSrc:   PainSc: 0-No pain                 Quadarius Henton COKER

## 2017-11-25 NOTE — Transfer of Care (Signed)
Immediate Anesthesia Transfer of Care Note  Patient: Sherry Dennis  Procedure(s) Performed: COLONOSCOPY WITH PROPOFOL (N/A )  Patient Location: PACU  Anesthesia Type:MAC  Level of Consciousness: awake, alert  and oriented  Airway & Oxygen Therapy: Patient Spontanous Breathing and Patient connected to face mask oxygen  Post-op Assessment: Report given to RN and Post -op Vital signs reviewed and stable  Post vital signs: Reviewed and stable  Last Vitals:  Vitals Value Taken Time  BP    Temp    Pulse 77 11/25/2017  9:36 AM  Resp 21 11/25/2017  9:36 AM  SpO2 100 % 11/25/2017  9:36 AM  Vitals shown include unvalidated device data.  Last Pain:  Vitals:   11/25/17 0935  TempSrc:   PainSc: 0-No pain         Complications: No apparent anesthesia complications

## 2017-11-25 NOTE — H&P (Signed)
[facility] History and Physical   Primary Care Physician:  Unk Pinto, MD   Reason for Procedure:   Colon polyp, lateral spreading in cecum  Plan:    Polypectomy with EMR     HPI: Sherry Dennis is a 57 y.o. female with multiple adenomatous polyps, large lateral spreading polyp in cecum, here for polypectomy with EMR.   Past Medical History:  Diagnosis Date  . Allergy   . Arthritis    "knees, hands, ankles, ~ every joint" (04/08/2015)  . Basal cell carcinoma    "face, hands, chest" (04/08/2015)  . Chronic bronchitis ([place])    "get it pretty much q yr" (04/08/2015)  . Dysrhythmia    palpitations occ; SVT 09/2013 s/p adenosine  . GERD (gastroesophageal reflux disease)   . H/O hiatal hernia   . Hx of cardiovascular stress test    Lexiscan Myoview 6/16: Ejection fraction is 60% and wall motion is normal. The study is normal. There is no scar or ischemia. This is a low risk scan.  . Hyperlipidemia   . Hypertension   . Hypothyroidism   . Palpitations   . Pneumonia 2015 X 1  . PONV (postoperative nausea and vomiting)   . S/P total knee arthroplasty 02/15/2014  . Thyroid disease   . Vitamin D deficiency     Past Surgical History:  Procedure Laterality Date  . BASAL CELL CARCINOMA EXCISION  2014   "off my chest"  . BLADDER REPAIR  1993   "lasered the holes shut"  . CARDIAC SURGERY    . CARPAL TUNNEL RELEASE Right 1990  . COLONOSCOPY    . ELECTROPHYSIOLOGIC STUDY N/A 04/08/2015   Procedure: SVT Ablation;  Surgeon: Evans Lance, MD;  Location: [facility];  Service: Cardiovascular;  Laterality: N/A;  . ENDOMETRIAL ABLATION  2009  . INCONTINENCE SURGERY  2010  . JOINT REPLACEMENT    . KNEE ARTHROSCOPY Right 2013  . KNEE ARTHROSCOPY    . PARTIAL KNEE ARTHROPLASTY Right 02/15/2014   Procedure: RIGHT UNICOMPARTMENTAL KNEE (MEDIAL COMPARTMENT);  Surgeon: Vickey Huger, MD;  Location: [facility];  Service: Orthopedics;  Laterality: Right;  . POLYPECTOMY    .  TUBAL LIGATION  1987    Prior to Admission medications   Medication Sig Start Date End Date Taking? Authorizing Provider  acetaminophen (TYLENOL) 650 MG CR tablet Take 1,300 mg by mouth 2 (two) times daily as needed for pain.   Yes [provider]  Adalimumab (HUMIRA) 40 MG/0.8ML PSKT Inject 40 mg into the skin every 14 (fourteen) days.    Yes [provider]  diphenhydrAMINE (BENADRYL) 25 MG tablet Take 25 mg by mouth daily.    Yes [provider]  NON FORMULARY Apply 1 application topically at bedtime. Dermatologist Office Cream for Rosacea   Yes [provider]  olmesartan (BENICAR) 40 MG tablet Take 1/2 - 1 tab for BP for goal of <130/80. Start first week on 1/2 tab only and monitor BPs regularly. Patient taking differently: Take 40 mg by mouth daily.  09/11/17  Yes Liane Comber, NP  ranitidine (ZANTAC) 75 MG tablet Take 75 mg by mouth daily as needed for heartburn.   Yes [provider]  SYNTHROID 150 MCG tablet Take 1 tablet (150 mcg total) by mouth daily before breakfast. 08/14/17  Yes Vicie Mutters, PA-C  VOLTAREN 1 % GEL Apply 2 g topically 4 (four) times daily. Patient taking differently: Apply 2 g topically 4 (four) times daily as needed (  joint pain).  12/28/16  Yes Garald Balding, MD  omeprazole (PRILOSEC) 40 MG capsule Take 1 capsule (40 mg total) by mouth 2 (two) times daily. Patient not taking: Reported on 11/18/2017 09/27/17   Mauri Pole, MD  PEG-KCl-NaCl-NaSulf-Na Asc-C (PLENVU) 140 g SOLR Take 1 Dose by mouth once. Given prep to patient in the office    [provider]  varenicline (CHANTIX CONTINUING MONTH PAK) 1 MG tablet Take 1 tablet (1 mg total) by mouth 2 (two) times daily. Patient not taking: Reported on 09/09/2017 07/24/17   Vicie Mutters, PA-C    No current facility-administered medications for this encounter.     Allergies as of 09/26/2017 - Review Complete 09/25/2017  Allergen Reaction Noted  .  Darvocet [propoxyphene n-acetaminophen] Anaphylaxis 12/21/2011  . Darvon [propoxyphene hcl] Anaphylaxis and Other (See Comments) 12/21/2011  . Propoxyphene Anaphylaxis 07/21/2015  . Doxycycline Nausea Only 08/30/2017  . Oseltamivir Hives 07/21/2015  . Tamiflu [oseltamivir phosphate] Other (See Comments) 12/21/2011    Family History  Problem Relation Age of Onset  . Hypertension Mother   . Atrial fibrillation Mother   . Diabetes Father   . Emphysema Father   . COPD Father   . Breast cancer Paternal Aunt        x 4 aunts,   . Stroke Maternal Grandmother   . Heart attack Maternal Grandmother   . Hyperlipidemia Maternal Grandfather   . Heart disease Maternal Grandfather   . Hyperlipidemia Paternal Grandmother   . Heart disease Paternal Grandmother   . Hyperlipidemia Paternal Grandfather   . Heart disease Paternal Grandfather   . Colon cancer Neg Hx   . Colon polyps Neg Hx   . Esophageal cancer Neg Hx   . Rectal cancer Neg Hx   . Ulcerative colitis Neg Hx   . Stomach cancer Neg Hx     Social History   Socioeconomic History  . Marital status: Divorced    Spouse name: Not on file  . Number of children: 2  . Years of education: Not on file  . Highest education level: Not on file  Occupational History  . Not on file  Social Needs  . Financial resource strain: Not on file  . Food insecurity:    Worry: Not on file    Inability: Not on file  . Transportation needs:    Medical: Not on file    Non-medical: Not on file  Tobacco Use  . Smoking status: Current Some Day Smoker    Packs/day: 1.50    Years: 40.00    Pack years: 60.00    Types: Cigarettes  . Smokeless tobacco: Never Used  . Tobacco comment: 04/08/2015 "went from ~ 2 ppd to 3 cigarettes in 1 week"  Substance and Sexual Activity  . Alcohol use: No    Alcohol/week: 0.0 oz    Comment: 04/08/2015 "couple drinks/year"  . Drug use: No  . Sexual activity: Not Currently  Lifestyle  . Physical activity:    Days per  week: Not on file    Minutes per session: Not on file  . Stress: Not on file  Relationships  . Social connections:    Talks on phone: Not on file    Gets together: Not on file    Attends religious service: Not on file    Active member of club or organization: Not on file    Attends meetings of clubs or organizations: Not on file    Relationship status: Not on file  .  Intimate partner violence:    Fear of current or ex partner: Not on file    Emotionally abused: Not on file    Physically abused: Not on file    Forced sexual activity: Not on file  Other Topics Concern  . Not on file  Social History Narrative  . Not on file    Review of Systems:  All other review of systems negative except as mentioned in the HPI.  Physical Exam: Vital signs in last 24 hours:     General:   Alert,  Well-developed, well-nourished, pleasant and cooperative in NAD Lungs:  Clear throughout to auscultation.   Heart:  Regular rate and rhythm; no murmurs, clicks, rubs,  or gallops. Abdomen:  Soft, nontender and nondistended. Normal bowel sounds.   Neuro/Psych:  Alert and cooperative. Normal mood and affect. A and O x 3   K. Denzil Magnuson , MD [phone]

## 2017-11-25 NOTE — Anesthesia Preprocedure Evaluation (Addendum)
Anesthesia Evaluation  Patient identified by MRN, date of birth, ID band Patient awake    Reviewed: Allergy & Precautions, NPO status , Patient's Chart, lab work & pertinent test results  Airway Mallampati: II  TM Distance: >3 FB Neck ROM: Full    Dental  (+) Teeth Intact, Dental Advisory Given   Pulmonary Current Smoker,    breath sounds clear to auscultation       Cardiovascular hypertension,  Rhythm:Regular Rate:Normal     Neuro/Psych    GI/Hepatic   Endo/Other    Renal/GU      Musculoskeletal   Abdominal   Peds  Hematology   Anesthesia Other Findings   Reproductive/Obstetrics                            Anesthesia Physical Anesthesia Plan  ASA: III  Anesthesia Plan: General   Post-op Pain Management:    Induction: Intravenous  PONV Risk Score and Plan: Ondansetron  Airway Management Planned: Natural Airway and Simple Face Mask  Additional Equipment:   Intra-op Plan:   Post-operative Plan:   Informed Consent: I have reviewed the patients History and Physical, chart, labs and discussed the procedure including the risks, benefits and alternatives for the proposed anesthesia with the patient or authorized representative who has indicated his/her understanding and acceptance.   Dental advisory given  Plan Discussed with: CRNA and Anesthesiologist  Anesthesia Plan Comments:         Anesthesia Quick Evaluation

## 2017-11-25 NOTE — Discharge Instructions (Signed)

## 2017-11-25 NOTE — Anesthesia Procedure Notes (Signed)
Date/Time: 11/25/2017 8:39 AM Performed by: Glory Buff, CRNA Oxygen Delivery Method: Simple face mask

## 2017-11-25 NOTE — Op Note (Signed)
[facility] Patient Name: Sherry Dennis Procedure Date: 11/25/2017 MRN: [id number] Attending MD: Mauri Pole , MD Date of Birth: [birth year] CSN: [id number] Age: 57 Admit Type: Inpatient Procedure:                Colonoscopy Indications:              Personal history of colonic polyps, Excision of                            colonic polyp Providers:                Mauri Pole, MD, Burtis Junes, RN, Alan Mulder, Technician Referring MD:              Medicines:                Monitored Anesthesia Care Complications:            No immediate complications. Estimated Blood Loss:     Estimated blood loss was minimal. Procedure:                Pre-Anesthesia Assessment:                           - Prior to the procedure, a History and Physical                            was performed, and patient medications and                            allergies were reviewed. The patient's tolerance of                            previous anesthesia was also reviewed. The risks                            and benefits of the procedure and the sedation                            options and risks were discussed with the patient.                            All questions were answered, and informed consent                            was obtained. Prior Anticoagulants: The patient has                            taken no previous anticoagulant or antiplatelet                            agents. ASA Grade Assessment: III - A patient with  severe systemic disease. After reviewing the risks                            and benefits, the patient was deemed in                            satisfactory condition to undergo the procedure.                           After obtaining informed consent, the colonoscope                            was passed under direct vision. Throughout the                            procedure, the patient's blood  pressure, pulse, and                            oxygen saturations were monitored continuously. The                            [id number] ([id number]) scope was introduced through                            the anus and advanced to the the cecum, identified                            by appendiceal orifice and ileocecal valve. The                            patient tolerated the procedure well. The quality                            of the bowel preparation was excellent. The                            ileocecal valve, appendiceal orifice, and rectum                            were photographed. The colonoscopy was somewhat                            difficult due to a tortuous colon and the patient's                            body habitus. Scope In: 8:46:23 AM Scope Out: 9:25:58 AM Scope Withdrawal Time: 0 hours 28 minutes 11 seconds  Total Procedure Duration: 0 hours 39 minutes 35 seconds  Findings:      The perianal and digital rectal examinations were normal.      A 18 mm polyp was found in the cecum. The polyp was flat with mucus cap.       Preparations were made for mucosal resection. Eleview was injected to       raise the lesion. Snare mucosal  resection was performed. Resection and       retrieval were complete. To close a defect after mucosal resection, one       hemostatic clip was successfully placed (MR conditional). There was no       bleeding at the end of the procedure. Impression:               - One 18 mm polyp in the cecum, removed with                            mucosal resection. Resected and retrieved. Clip (MR                            conditional) was placed.                           - Mucosal resection was performed. Resection and                            retrieval were complete. Moderate Sedation:      N/A- Per Anesthesia Care Recommendation:           - Patient has a contact number available for                            emergencies. The signs and symptoms  of potential                            delayed complications were discussed with the                            patient. Return to normal activities tomorrow.                            Written discharge instructions were provided to the                            patient.                           - Full liquid diet today, then advance as tolerated                            to soft diet for 5 days.                           - Continue present medications.                           - No aspirin, ibuprofen, naproxen, or other                            non-steroidal anti-inflammatory drugs for 2 weeks.                           - Await pathology results.                           -  Repeat colonoscopy in 1 year for surveillance                            after piecemeal polypectomy. Procedure Code(s):        --- Professional ---                           [phone], Colonoscopy, flexible; with endoscopic                            mucosal resection Diagnosis Code(s):        --- Professional ---                           Z86.010, Personal history of colonic polyps                           D12.0, Benign neoplasm of cecum CPT copyright 2017 [employer]. All rights reserved. The codes documented in this report are preliminary and upon coder review may  be revised to meet current compliance requirements. Mauri Pole, MD 11/25/2017 9:37:32 AM This report has been signed electronically. Number of Addenda: 0

## 2017-11-27 ENCOUNTER — Encounter: Payer: Self-pay | Admitting: Gastroenterology

## 2017-12-02 DIAGNOSIS — C44319 Basal cell carcinoma of skin of other parts of face: Secondary | ICD-10-CM | POA: Diagnosis not present

## 2017-12-18 DIAGNOSIS — M67371 Transient synovitis, right ankle and foot: Secondary | ICD-10-CM | POA: Diagnosis not present

## 2017-12-18 DIAGNOSIS — M19071 Primary osteoarthritis, right ankle and foot: Secondary | ICD-10-CM | POA: Diagnosis not present

## 2017-12-18 DIAGNOSIS — M722 Plantar fascial fibromatosis: Secondary | ICD-10-CM | POA: Diagnosis not present

## 2017-12-18 DIAGNOSIS — M67372 Transient synovitis, left ankle and foot: Secondary | ICD-10-CM | POA: Diagnosis not present

## 2017-12-18 DIAGNOSIS — M76821 Posterior tibial tendinitis, right leg: Secondary | ICD-10-CM | POA: Diagnosis not present

## 2017-12-18 DIAGNOSIS — M76822 Posterior tibial tendinitis, left leg: Secondary | ICD-10-CM | POA: Diagnosis not present

## 2017-12-26 DIAGNOSIS — M71572 Other bursitis, not elsewhere classified, left ankle and foot: Secondary | ICD-10-CM | POA: Diagnosis not present

## 2017-12-26 DIAGNOSIS — M722 Plantar fascial fibromatosis: Secondary | ICD-10-CM | POA: Diagnosis not present

## 2018-01-01 DIAGNOSIS — M722 Plantar fascial fibromatosis: Secondary | ICD-10-CM | POA: Diagnosis not present

## 2018-01-02 ENCOUNTER — Other Ambulatory Visit: Payer: Self-pay | Admitting: *Deleted

## 2018-01-02 MED ORDER — OLMESARTAN MEDOXOMIL 20 MG PO TABS: ORAL_TABLET | ORAL | 1 refills | Status: DC

## 2018-01-20 DIAGNOSIS — M0579 Rheumatoid arthritis with rheumatoid factor of multiple sites without organ or systems involvement: Secondary | ICD-10-CM | POA: Diagnosis not present

## 2018-01-20 DIAGNOSIS — M5030 Other cervical disc degeneration, unspecified cervical region: Secondary | ICD-10-CM | POA: Diagnosis not present

## 2018-01-23 DIAGNOSIS — M722 Plantar fascial fibromatosis: Secondary | ICD-10-CM | POA: Diagnosis not present

## 2018-02-06 ENCOUNTER — Encounter: Payer: Self-pay | Admitting: Internal Medicine

## 2018-02-06 ENCOUNTER — Ambulatory Visit (INDEPENDENT_AMBULATORY_CARE_PROVIDER_SITE_OTHER): Payer: 59 | Admitting: Internal Medicine

## 2018-02-06 VITALS — BP 100/80 | HR 83 | Temp 97.6°F | Resp 18 | Ht 65.0 in | Wt 241.0 lb

## 2018-02-06 DIAGNOSIS — E039 Hypothyroidism, unspecified: Secondary | ICD-10-CM

## 2018-02-06 DIAGNOSIS — E559 Vitamin D deficiency, unspecified: Secondary | ICD-10-CM

## 2018-02-06 DIAGNOSIS — J40 Bronchitis, not specified as acute or chronic: Secondary | ICD-10-CM

## 2018-02-06 DIAGNOSIS — I10 Essential (primary) hypertension: Secondary | ICD-10-CM

## 2018-02-06 DIAGNOSIS — J0140 Acute pansinusitis, unspecified: Secondary | ICD-10-CM

## 2018-02-06 DIAGNOSIS — D649 Anemia, unspecified: Secondary | ICD-10-CM

## 2018-02-06 DIAGNOSIS — R7303 Prediabetes: Secondary | ICD-10-CM | POA: Diagnosis not present

## 2018-02-06 DIAGNOSIS — E782 Mixed hyperlipidemia: Secondary | ICD-10-CM

## 2018-02-06 MED ORDER — AZITHROMYCIN 250 MG PO TABS: ORAL_TABLET | ORAL | 1 refills | Status: DC

## 2018-02-06 MED ORDER — PREDNISONE 20 MG PO TABS: ORAL_TABLET | ORAL | 0 refills | Status: DC

## 2018-02-06 NOTE — Progress Notes (Signed)
This very nice 57 y.o. DWF presents for 3 month follow up with HTN, HLD, Pre-Diabetes and Vitamin D Deficiency.  Patient was dx'd with Rheumatoid arthritis by Dr Amil Amen [place] in [place].      Patient is treated for labile HTN since 2010 & BP has been controlled at home. Today's BP was normal by the nurse 100/80 (Lt arm)  and rechecked at 129/103 and 129/94  (Rt arm) by myself.  Patient had an ablation for pSVT by Dr Johnsie Cancel & Lovena Le in 2016.   Patient has had no complaints of any cardiac type chest pain, palpitations, dyspnea / orthopnea / PND, dizziness, claudication, or dependent edema.     Hyperlipidemia is not controlled with diet & meds. Patient denies myalgias or other med SE's. Last Lipids were  Lab Results  Component Value Date   CHOL 228 (H) 07/24/2017   HDL 51 07/24/2017   LDLCALC 146 (H) 07/24/2017   TRIG 170 (H) 07/24/2017   CHOLHDL 4.5 07/24/2017      Also, the patient has history of PreDiabetes (A1c 6.1%/2015) and has had no symptoms of reactive hypoglycemia, diabetic polys, paresthesias or visual blurring.  Last A1c was not at goal  Lab Results  Component Value Date   HGBA1C 6.2 (H) 07/24/2017      P{atient has been on Thyroid Replacement circa 2003. Further, the patient also has history of Vitamin D Deficiency and does not supplement vitamin D as previously advised. Last vitamin D was   Lab Results  Component Value Date   VD25OH 16 (L) 07/24/2017   Current Outpatient Medications on File Prior to Visit  Medication Sig  . acetaminophen (TYLENOL) 650 MG CR tablet Take 1,300 mg by mouth 2 (two) times daily as needed for pain.  . Adalimumab (HUMIRA) 40 MG/0.8ML PSKT Inject 40 mg into the skin every 14 (fourteen) days.   . diphenhydrAMINE (BENADRYL) 25 MG tablet Take 25 mg by mouth daily.   . NON FORMULARY Apply 1 application topically at bedtime. Dermatologist Office Cream for Rosacea  . olmesartan (BENICAR) 20 MG tablet Take 2 tablets =40 mg daily.  Marland Kitchen  PEG-KCl-NaCl-NaSulf-Na Asc-C (PLENVU) 140 g SOLR Take 1 Dose by mouth once. Given prep to patient in the office  . ranitidine (ZANTAC) 75 MG tablet Take 75 mg by mouth daily as needed for heartburn.  Marland Kitchen SYNTHROID 150 MCG tablet Take 1 tablet (150 mcg total) by mouth daily before breakfast.  . VOLTAREN 1 % GEL Apply 2 g topically 4 (four) times daily. (Patient taking differently: Apply 2 g topically 4 (four) times daily as needed (joint pain). )   No current facility-administered medications on file prior to visit.    Allergies  Allergen Reactions  . Darvocet [Propoxyphene N-Acetaminophen] Anaphylaxis  . Darvon [Propoxyphene Hcl] Anaphylaxis and Other (See Comments)    Airway swelling   . Propoxyphene Anaphylaxis    Throat closes up  . Doxycycline Nausea Only  . Tamiflu [Oseltamivir Phosphate] Hives and Other (See Comments)    Increased blood pressure   PMHx:   Past Medical History:  Diagnosis Date  . Allergy   . Arthritis    "knees, hands, ankles, ~ every joint" (04/08/2015)  . Basal cell carcinoma    "face, hands, chest" (04/08/2015)  . Chronic bronchitis ([place])    "get it pretty much q yr" (04/08/2015)  . Dysrhythmia    palpitations occ; SVT 09/2013 s/p adenosine  . GERD (gastroesophageal reflux disease)   .  H/O hiatal hernia   . Hx of cardiovascular stress test    Lexiscan Myoview 6/16: Ejection fraction is 60% and wall motion is normal. The study is normal. There is no scar or ischemia. This is a low risk scan.  . Hyperlipidemia   . Hypertension   . Hypothyroidism   . Palpitations   . Pneumonia 2015 X 1  . PONV (postoperative nausea and vomiting)   . S/P total knee arthroplasty 02/15/2014  . Thyroid disease   . Vitamin D deficiency    Immunization History  Administered Date(s) Administered  . Influenza-Unspecified 05/14/2015, 05/27/2017  . PPD Test 12/02/2013  . Pneumococcal Polysaccharide-23 04/08/2012  . Td 10/16/2004  . Tdap 07/24/2017   Past Surgical History:    Procedure Laterality Date  . BASAL CELL CARCINOMA EXCISION  2014   "off my chest"  . BLADDER REPAIR  1993   "lasered the holes shut"  . CARDIAC SURGERY    . CARPAL TUNNEL RELEASE Right 1990  . COLONOSCOPY    . COLONOSCOPY WITH PROPOFOL N/A 11/25/2017   Procedure: COLONOSCOPY WITH PROPOFOL;  Surgeon: Mauri Pole, MD;  Location: [facility];  Service: Endoscopy;  Laterality: N/A;  . ELECTROPHYSIOLOGIC STUDY N/A 04/08/2015   Procedure: SVT Ablation;  Surgeon: Evans Lance, MD;  Location: [facility];  Service: Cardiovascular;  Laterality: N/A;  . ENDOMETRIAL ABLATION  2009  . INCONTINENCE SURGERY  2010  . JOINT REPLACEMENT    . KNEE ARTHROSCOPY Right 2013  . KNEE ARTHROSCOPY    . PARTIAL KNEE ARTHROPLASTY Right 02/15/2014   Procedure: RIGHT UNICOMPARTMENTAL KNEE (MEDIAL COMPARTMENT);  Surgeon: Vickey Huger, MD;  Location: [facility];  Service: Orthopedics;  Laterality: Right;  . POLYPECTOMY    . TUBAL LIGATION  1987   FHx:    Reviewed / unchanged  SHx:    Reviewed / unchanged   Systems Review:  Constitutional: Denies fever, chills, wt changes, headaches, insomnia, fatigue, night sweats, change in appetite. Eyes: Denies redness, blurred vision, diplopia, discharge, itchy, watery eyes.  ENT: Denies discharge, congestion, post nasal drip, epistaxis, sore throat, earache, hearing loss, dental pain, tinnitus, vertigo , snoring.  C/o head congestion an sinus pressure & sinus pain CV: Denies chest pain, palpitations, irregular heartbeat, syncope, dyspnea, diaphoresis, orthopnea, PND, claudication or edema. Respiratory: Has productive cough, denies dyspnea, DOE, pleurisy, hoarseness, laryngitis, wheezing.  Gastrointestinal: Denies dysphagia, odynophagia, heartburn, reflux, water brash, abdominal pain or cramps, nausea, vomiting, bloating, diarrhea, constipation, hematemesis, melena, hematochezia  or hemorrhoids. Genitourinary: Denies dysuria, frequency, urgency, nocturia, hesitancy,  discharge, hematuria or flank pain. Musculoskeletal: Denies arthralgias, myalgias, stiffness, jt. swelling, pain, limping or strain/sprain.  Skin: Denies pruritus, rash, hives, warts, acne, eczema or change in skin lesion(s). Neuro: No weakness, tremor, incoordination, spasms, paresthesia or pain. Psychiatric: Denies confusion, memory loss or sensory loss. Endo: Denies change in weight, skin or hair change.  Heme/Lymph: No excessive bleeding, bruising or enlarged lymph nodes.  Physical Exam  BP 100/80   Pulse 83   Temp 97.6 F (36.4 C)   Resp 18   Ht 5\' 5"  (1.651 m)   Wt 241 lb (109.3 kg)   SpO2 98%   BMI 40.10 kg/m   Postural sitting BP 129/103   P 77 and   Standing BP 129/94    P 70   In RA.   Appears  well nourished, well groomed  and in no distress.  Eyes: PERRLA, EOMs, conjunctiva no swelling or erythema. Sinuses: (+) L>R Maxillary sinuses.  frontal/maxillary tenderness ENT/Mouth: EAC's clear, TM's nl w/o erythema, bulging. Nares clear w/o erythema, swelling, exudates. Oropharynx clear without erythema or exudates. Oral hygiene is good. Tongue normal, non obstructing. Hearing intact.  Neck: Supple. Thyroid not palpable. Car 2+/2+ without bruits, nodes or JVD. Chest: Respirations nl with few scattered  Rales &  Rhonchi, but no wheezing or stridor.  Cor: Heart sounds normal w/ regular rate and rhythm without sig. murmurs, gallops, clicks or rubs. Peripheral pulses normal and equal  without edema.  Abdomen: Soft & bowel sounds normal. Non-tender w/o guarding, rebound, hernias, masses or organomegaly.  Lymphatics: Unremarkable.  Musculoskeletal: Full ROM all peripheral extremities, joint stability, 5/5 strength and normal gait.  Skin: Warm, dry without exposed rashes, lesions or ecchymosis apparent.  Neuro: Cranial nerves intact, reflexes equal bilaterally. Sensory-motor testing grossly intact. Tendon reflexes grossly intact.  Pysch: Alert & oriented x 3.  Insight and judgement  nl & appropriate. No ideations.  Assessment and Plan:  1. Essential hypertension  - Recommended monitor BID BP am & PM and return in 2 weeks with list to review.    - Continue DASH diet.  Reminder to go to the ER if any CP,  SOB, nausea, dizziness, severe HA, changes vision/speech.  - CBC with Differential/Platelet - COMPLETE METABOLIC PANEL WITH GFR - Magnesium - TSH  2. Hyperlipidemia, mixed  - Continue diet/meds, exercise,& lifestyle modifications.  - Continue monitor periodic cholesterol/liver & renal functions   - Lipid panel - TSH  3. Prediabetes  - Continue diet, exercise, lifestyle modifications.  - Monitor appropriate labs.  - Hemoglobin A1c - Insulin, random  4. Vitamin D deficiency  - Continue encourage supplementation.   - VITAMIN D 25 Hydroxyl  5. Hypothyroidism  - TSH  6. Anemia, unspecified type  - CBC with Differential/Platelet - COMPLETE METABOLIC PANEL WITH GFR - Magnesium - Lipid panel - TSH - Hemoglobin A1c - Insulin, random - VITAMIN D 25 Hydroxyl  7. Subacute pansinusitis  - predniSONE (DELTASONE) 20 MG tablet; 1 tab 3 x day for 3 days, then 1 tab 2 x day for 3 days, then 1 tab 1 x day for 5 days  Dispense: 20 tablet  - azithromycin (ZITHROMAX) 250 MG tablet; Take 2 tablets (500 mg) on  Day 1,  followed by 1 tablet (250 mg) once daily on Days 2 through 5.  Dispense: 6 each; Refill: 1  8. Bronchitis  - predniSONE (DELTASONE) 20 MG tablet; 1 tab 3 x day for 3 days, then 1 tab 2 x day for 3 days, then 1 tab 1 x day for 5 days  Dispense: 20 tablet  - azithromycin (ZITHROMAX) 250 MG tablet; Take 2 tablets (500 mg) on  Day 1,  followed by 1 tablet (250 mg) once daily on Days 2 through 5.  Dispense: 6 each; Refill: 1      Discussed  regular exercise, BP monitoring, weight control to achieve/maintain BMI less than 25 and discussed med and SE's. Recommended labs to assess and monitor clinical status with further disposition pending results  of labs. Over 30 minutes of exam, counseling, chart review was performed.

## 2018-02-06 NOTE — Patient Instructions (Signed)
Recommend Adult Low Dose Aspirin or  coated  Aspirin 81 mg daily  To reduce risk of Colon Cancer 20 %,  Skin Cancer 26 % ,  Melanoma 46%  and  Pancreatic cancer 60% +++++++++++++++++++++++++ Vitamin D goal  is between 70-100.  Please make sure that you are taking your Vitamin D as directed.  It is very important as a natural anti-inflammatory  helping hair, skin, and nails, as well as reducing stroke and heart attack risk.  It helps your bones and helps with mood. It also decreases numerous cancer risks so please take it as directed.  Low Vit D is associated with a 200-300% higher risk for CANCER  and 200-300% higher risk for HEART   ATTACK  &  STROKE.   ...................................... It is also associated with higher death rate at younger ages,  autoimmune diseases like Rheumatoid arthritis, Lupus, Multiple Sclerosis.    Also many other serious conditions, like depression, Alzheimer's Dementia, infertility, muscle aches, fatigue, fibromyalgia - just to name a few. ++++++++++++++++++++ Recommend the book "The END of DIETING" by Dr Joel Fuhrman  & the book "The END of DIABETES " by Dr Joel Fuhrman At [employer] - get book & Audio CD's    Being diabetic has a  300% increased risk for heart attack, stroke, cancer, and alzheimer- type vascular dementia. It is very important that you work harder with diet by avoiding all foods that are white. Avoid white rice (brown & wild rice is OK), white potatoes (sweetpotatoes in moderation is OK), White bread or wheat bread or anything made out of white flour like bagels, donuts, rolls, buns, biscuits, cakes, pastries, cookies, pizza crust, and pasta (made from white flour & egg whites) - vegetarian pasta or spinach or wheat pasta is OK. Multigrain breads like Arnold's or Pepperidge Farm, or multigrain sandwich thins or flatbreads.  Diet, exercise and weight loss can reverse and cure diabetes in the early stages.  Diet, exercise and weight loss is  very important in the control and prevention of complications of diabetes which affects every system in your body, ie. Brain - dementia/stroke, eyes - glaucoma/blindness, heart - heart attack/heart failure, kidneys - dialysis, stomach - gastric paralysis, intestines - malabsorption, nerves - severe painful neuritis, circulation - gangrene & loss of a leg(s), and finally cancer and Alzheimers.    I recommend avoid fried & greasy foods,  sweets/candy, white rice (brown or wild rice or Quinoa is OK), white potatoes (sweet potatoes are OK) - anything made from white flour - bagels, doughnuts, rolls, buns, biscuits,white and wheat breads, pizza crust and traditional pasta made of white flour & egg white(vegetarian pasta or spinach or wheat pasta is OK).  Multi-grain bread is OK - like multi-grain flat bread or sandwich thins. Avoid alcohol in excess. Exercise is also important.    Eat all the vegetables you want - avoid meat, especially red meat and dairy - especially cheese.  Cheese is the most concentrated form of trans-fats which is the worst thing to clog up our arteries. Veggie cheese is OK which can be found in the fresh produce section at [employer] or [employer] or [employer]  +++++++++++++++++++++ DASH Eating Plan  DASH stands for "Dietary Approaches to Stop Hypertension."   The DASH eating plan is a healthy eating plan that has been shown to reduce high blood pressure (hypertension). Additional health benefits may include reducing the risk of type 2 diabetes mellitus, heart disease, and stroke. The DASH eating plan may also   help with weight loss. WHAT DO I NEED TO KNOW ABOUT THE DASH EATING PLAN? For the DASH eating plan, you will follow these general guidelines:  Choose foods with a percent daily value for sodium of less than 5% (as listed on the food label).  Use salt-free seasonings or herbs instead of table salt or sea salt.  Check with your health care provider or pharmacist before  using salt substitutes.  Eat lower-sodium products, often labeled as "lower sodium" or "no salt added."  Eat fresh foods.  Eat more vegetables, fruits, and low-fat dairy products.  Choose whole grains. Look for the word "whole" as the first word in the ingredient list.  Choose fish   Limit sweets, desserts, sugars, and sugary drinks.  Choose heart-healthy fats.  Eat veggie cheese   Eat more home-cooked food and less restaurant, buffet, and fast food.  Limit fried foods.  Propps foods using methods other than frying.  Limit canned vegetables. If you do use them, rinse them well to decrease the sodium.  When eating at a restaurant, ask that your food be prepared with less salt, or no salt if possible.                      WHAT FOODS CAN I EAT? Read Dr Joel Fuhrman's books on The End of Dieting & The End of Diabetes  Grains Whole grain or whole wheat bread. Brown rice. Whole grain or whole wheat pasta. Quinoa, bulgur, and whole grain cereals. Low-sodium cereals. Corn or whole wheat flour tortillas. Whole grain cornbread. Whole grain crackers. Low-sodium crackers.  Vegetables Fresh or frozen vegetables (raw, steamed, roasted, or grilled). Low-sodium or reduced-sodium tomato and vegetable juices. Low-sodium or reduced-sodium tomato sauce and paste. Low-sodium or reduced-sodium canned vegetables.   Fruits All fresh, canned (in natural juice), or frozen fruits.  Protein Products  All fish and seafood.  Dried beans, peas, or lentils. Unsalted nuts and seeds. Unsalted canned beans.  Dairy Low-fat dairy products, such as skim or 1% milk, 2% or reduced-fat cheeses, low-fat ricotta or cottage cheese, or plain low-fat yogurt. Low-sodium or reduced-sodium cheeses.  Fats and Oils Tub margarines without trans fats. Light or reduced-fat mayonnaise and salad dressings (reduced sodium). Avocado. Safflower, olive, or canola oils. Natural peanut or almond butter.  Other Unsalted popcorn  and pretzels. The items listed above may not be a complete list of recommended foods or beverages. Contact your dietitian for more options.  +++++++++++++++  WHAT FOODS ARE NOT RECOMMENDED? Grains/ White flour or wheat flour White bread. White pasta. White rice. Refined cornbread. Bagels and croissants. Crackers that contain trans fat.  Vegetables  Creamed or fried vegetables. Vegetables in a . Regular canned vegetables. Regular canned tomato sauce and paste. Regular tomato and vegetable juices.  Fruits Dried fruits. Canned fruit in light or heavy syrup. Fruit juice.  Meat and Other Protein Products Meat in general - RED meat & White meat.  Fatty cuts of meat. Ribs, chicken wings, all processed meats as bacon, sausage, bologna, salami, fatback, hot dogs, bratwurst and packaged luncheon meats.  Dairy Whole or 2% milk, cream, half-and-half, and cream cheese. Whole-fat or sweetened yogurt. Full-fat cheeses or blue cheese. Non-dairy creamers and whipped toppings. Processed cheese, cheese spreads, or cheese curds.  Condiments Onion and garlic salt, seasoned salt, table salt, and sea salt. Canned and packaged gravies. Worcestershire sauce. Tartar sauce. Barbecue sauce. Teriyaki sauce. Soy sauce, including reduced sodium. Steak sauce. Fish sauce. Oyster sauce. Cocktail   sauce. Horseradish. Ketchup and mustard. Meat flavorings and tenderizers. Bouillon cubes. Hot sauce. Tabasco sauce. Marinades. Taco seasonings. Relishes.  Fats and Oils Butter, stick margarine, lard, shortening and bacon fat. Coconut, palm kernel, or palm oils. Regular salad dressings.  Pickles and olives. Salted popcorn and pretzels.  The items listed above may not be a complete list of foods and beverages to avoid.   

## 2018-02-07 LAB — CBC WITH DIFFERENTIAL/PLATELET
BASOS ABS: 29 {cells}/uL (ref 0–200)
Basophils Relative: 0.4 %
EOS ABS: 58 {cells}/uL (ref 15–500)
Eosinophils Relative: 0.8 %
HEMATOCRIT: 34.1 % — AB (ref 35.0–45.0)
HEMOGLOBIN: 11.9 g/dL (ref 11.7–15.5)
LYMPHS ABS: 2196 {cells}/uL (ref 850–3900)
MCH: 32 pg (ref 27.0–33.0)
MCHC: 34.9 g/dL (ref 32.0–36.0)
MCV: 91.7 fL (ref 80.0–100.0)
MONOS PCT: 10.2 %
MPV: 12.1 fL (ref 7.5–12.5)
NEUTROS ABS: 4183 {cells}/uL (ref 1500–7800)
NEUTROS PCT: 58.1 %
Platelets: 194 10*3/uL (ref 140–400)
RBC: 3.72 10*6/uL — ABNORMAL LOW (ref 3.80–5.10)
RDW: 12.7 % (ref 11.0–15.0)
Total Lymphocyte: 30.5 %
WBC mixed population: 734 cells/uL (ref 200–950)
WBC: 7.2 10*3/uL (ref 3.8–10.8)

## 2018-02-07 LAB — COMPLETE METABOLIC PANEL WITH GFR
AG RATIO: 1.4 (calc) (ref 1.0–2.5)
ALBUMIN MSPROF: 3.8 g/dL (ref 3.6–5.1)
ALKALINE PHOSPHATASE (APISO): 76 U/L (ref 33–130)
ALT: 14 U/L (ref 6–29)
AST: 12 U/L (ref 10–35)
BILIRUBIN TOTAL: 0.4 mg/dL (ref 0.2–1.2)
BUN: 15 mg/dL (ref 7–25)
CHLORIDE: 104 mmol/L (ref 98–110)
CO2: 26 mmol/L (ref 20–32)
Calcium: 9 mg/dL (ref 8.6–10.4)
Creat: 0.83 mg/dL (ref 0.50–1.05)
GFR, Est African American: 92 mL/min/{1.73_m2} (ref >=60)
GFR, Est Non African American: 79 mL/min/{1.73_m2} (ref >=60)
GLOBULIN: 2.7 g/dL (ref 1.9–3.7)
Glucose, Bld: 95 mg/dL (ref 65–99)
POTASSIUM: 4.1 mmol/L (ref 3.5–5.3)
SODIUM: 138 mmol/L (ref 135–146)
Total Protein: 6.5 g/dL (ref 6.1–8.1)

## 2018-02-07 LAB — HEMOGLOBIN A1C
EAG (MMOL/L): 6.3 (calc)
Hgb A1c MFr Bld: 5.6 % of total Hgb (ref <5.7)
Mean Plasma Glucose: 114 (calc)

## 2018-02-07 LAB — TSH: TSH: 0.08 m[IU]/L — AB

## 2018-02-07 LAB — VITAMIN D 25 HYDROXY (VIT D DEFICIENCY, FRACTURES): Vit D, 25-Hydroxy: 15 ng/mL — ABNORMAL LOW (ref 30–100)

## 2018-02-07 LAB — LIPID PANEL
Cholesterol: 181 mg/dL (ref <200)
HDL: 46 mg/dL — ABNORMAL LOW (ref >50)
LDL CHOLESTEROL (CALC): 113 mg/dL — AB
Non-HDL Cholesterol (Calc): 135 mg/dL (calc) — ABNORMAL HIGH (ref <130)
Total CHOL/HDL Ratio: 3.9 (calc) (ref <5.0)
Triglycerides: 112 mg/dL (ref <150)

## 2018-02-07 LAB — MAGNESIUM: MAGNESIUM: 2 mg/dL (ref 1.5–2.5)

## 2018-02-07 LAB — INSULIN, RANDOM: Insulin: 12.3 u[IU]/mL (ref 2.0–19.6)

## 2018-03-04 ENCOUNTER — Other Ambulatory Visit: Payer: Self-pay | Admitting: Internal Medicine

## 2018-03-04 ENCOUNTER — Encounter: Payer: Self-pay | Admitting: Internal Medicine

## 2018-03-04 ENCOUNTER — Ambulatory Visit: Payer: 59 | Admitting: Internal Medicine

## 2018-03-04 VITALS — BP 142/94 | HR 68 | Temp 97.3°F | Resp 16 | Ht 66.0 in | Wt 228.0 lb

## 2018-03-04 DIAGNOSIS — Z79899 Other long term (current) drug therapy: Secondary | ICD-10-CM

## 2018-03-04 DIAGNOSIS — I10 Essential (primary) hypertension: Secondary | ICD-10-CM | POA: Diagnosis not present

## 2018-03-04 DIAGNOSIS — E039 Hypothyroidism, unspecified: Secondary | ICD-10-CM | POA: Diagnosis not present

## 2018-03-04 DIAGNOSIS — Z6837 Body mass index (BMI) 37.0-37.9, adult: Secondary | ICD-10-CM

## 2018-03-04 LAB — TSH: TSH: 0.09 mIU/L — ABNORMAL LOW

## 2018-03-05 ENCOUNTER — Encounter: Payer: Self-pay | Admitting: Internal Medicine

## 2018-03-05 MED ORDER — LEVOTHYROXINE SODIUM 100 MCG PO TABS: 100.0000 ug | ORAL_TABLET | Freq: Every day | ORAL | 1 refills | Status: DC

## 2018-03-05 MED ORDER — OLMESARTAN MEDOXOMIL 40 MG PO TABS: ORAL_TABLET | ORAL | 1 refills | Status: DC

## 2018-03-05 MED ORDER — PHENTERMINE HCL 37.5 MG PO TABS: ORAL_TABLET | ORAL | 5 refills | Status: DC

## 2018-03-05 NOTE — Progress Notes (Signed)
   Subjective:    Patient ID: Sherry Dennis, female    DOB: Jan 02, 1961, 57 y.o.   MRN: [id number]  HPI  This delightful 57 yo DWF with labile HTN returns for f/u of BP and tapered dose of Thyroid which she did nor understand and did not decrease her dose as recommend from 7 tabs to 5 tabs/week. She has not monitored her BP's and generally has felt well. She does request a script for the over priced advertised diet pills.   Medication Sig  . acetaminophen  650 MG CR tablet Take 1,300 mg by mouth 2 (two) times daily as needed for pain.  Marland Kitchen HUMIRA 40 MG/0.8ML Inject 40 mg into the skin every 14 (fourteen) days.   . diphenhydrAMINE 25 MG tablet Take 25 mg by mouth daily.   . Cream for Rosacea Apply 1 application topically at bedtime   . ranitidine 75 MG tablet Take 75 mg by mouth daily as needed for heartburn.  . VOLTAREN 1 % GEL Apply 2 g topically 4 (four) times daily  . olmesartan  20 MG tablet TAKE 2 TABLETS BY MOUTH DAILY=40 MG DAILY.  . SYNTHROID 150 MCG tablet Take 1 tablet (150 mcg total) by mouth daily before breakfast.   Allergies  Allergen Reactions  . Darvocet [Propoxyphene N-Acetaminophen] Anaphylaxis  . Darvon [Propoxyphene Hcl] Anaphylaxis and Other (See Comments)    Airway swelling   . Propoxyphene Anaphylaxis    Throat closes up  . Doxycycline Nausea Only  . Tamiflu [Oseltamivir Phosphate] Hives and Other (See Comments)    Increased blood pressure   Past Medical History:  Diagnosis Date  . Allergy   . Arthritis    "knees, hands, ankles, ~ every joint" (04/08/2015)  . Basal cell carcinoma    "face, hands, chest" (04/08/2015)  . Chronic bronchitis ([place])    "get it pretty much q yr" (04/08/2015)  . Dysrhythmia    palpitations occ; SVT 09/2013 s/p adenosine  . GERD (gastroesophageal reflux disease)   . H/O hiatal hernia   . Hx of cardiovascular stress test    Lexiscan Myoview 6/16: Ejection fraction is 60% and wall motion is normal. The study is normal. There is no scar or  ischemia. This is a low risk scan.  . Hyperlipidemia   . Hypertension   . Hypothyroidism   . Palpitations   . Pneumonia 2015 X 1  . PONV (postoperative nausea and vomiting)   . S/P total knee arthroplasty 02/15/2014  . Thyroid disease   . Vitamin D deficiency    Review of Systems    10 point systems review negative except as above.    Objective:   Physical Exam  BP (!) 142/94   Pulse 68   Temp (!) 97.3 F (36.3 C)   Resp 16   Wt 228 lb (103.4 kg)   SpO2 99%   BMI 37.94 kg/m   HEENT - WNL. Neck - supple.  Chest - Clear equal BS. Cor - Nl HS. RRR w/o sig MGR. PP 1(+). No edema. MS- FROM w/o deformities.  Gait Nl. Neuro -  Nl w/o focal abnormalities.    Assessment & Plan:   1. Essential hypertension  - TSH  2. Hypothyroidism  - TSH  3. Class 2 severe obesity due to excess calories with serious comorbidity and body mass index (BMI) of 37.0 to 37.9 in adult ([place])   4. Medication management

## 2018-03-10 ENCOUNTER — Ambulatory Visit: Payer: Self-pay | Admitting: Internal Medicine

## 2018-03-11 ENCOUNTER — Encounter (INDEPENDENT_AMBULATORY_CARE_PROVIDER_SITE_OTHER): Payer: Self-pay

## 2018-03-11 DIAGNOSIS — M069 Rheumatoid arthritis, unspecified: Secondary | ICD-10-CM | POA: Diagnosis not present

## 2018-03-16 ENCOUNTER — Other Ambulatory Visit: Payer: Self-pay | Admitting: Physician Assistant

## 2018-03-17 NOTE — Telephone Encounter (Signed)
-----   Message from Unk Pinto, MD sent at 03/16/2018 11:38 AM EDT ----- Regarding: thyroid med   Patient tried to fill Rx for Thyroxine 150 mcg (& I declined it)   Dose was changed & sent in new Rx for thyroxine 100 mcg on 7/24   Did she not understand (again) and not pick up & start the new dose   She needs a NV 1 month after starts the new  100 mcg dose

## 2018-03-17 NOTE — Telephone Encounter (Signed)
The patient was called and informed to start Levothyroxine 100 mcg 1 tablet daily, per her last lab results.  The patient will start the 100 mcg tablets and a NV was scheduled for 04/17/2018 to recheck her TSH.

## 2018-04-17 ENCOUNTER — Ambulatory Visit: Payer: 59

## 2018-04-17 DIAGNOSIS — E079 Disorder of thyroid, unspecified: Secondary | ICD-10-CM | POA: Diagnosis not present

## 2018-04-17 NOTE — Progress Notes (Signed)
Patient presents to the office for a nurse visit to have TSH lab done. Patient had a recent med change of Thyroxine from 139mcg to 169mcg. Patient also stated that she has started taking Vitamin D3 10,000 IU daily.

## 2018-04-18 ENCOUNTER — Other Ambulatory Visit: Payer: Self-pay | Admitting: Internal Medicine

## 2018-04-18 DIAGNOSIS — E039 Hypothyroidism, unspecified: Secondary | ICD-10-CM

## 2018-04-18 LAB — TSH: TSH: 8.99 mIU/L — ABNORMAL HIGH

## 2018-04-18 MED ORDER — LEVOTHYROXINE SODIUM 125 MCG PO TABS: ORAL_TABLET | ORAL | 1 refills | Status: DC

## 2018-04-21 ENCOUNTER — Encounter: Payer: Self-pay | Admitting: Physician Assistant

## 2018-04-21 ENCOUNTER — Ambulatory Visit: Payer: 59 | Admitting: Physician Assistant

## 2018-04-21 VITALS — BP 140/96 | HR 96 | Temp 97.4°F | Resp 16 | Ht 66.0 in | Wt 230.0 lb

## 2018-04-21 DIAGNOSIS — G4730 Sleep apnea, unspecified: Secondary | ICD-10-CM | POA: Diagnosis not present

## 2018-04-21 DIAGNOSIS — I10 Essential (primary) hypertension: Secondary | ICD-10-CM

## 2018-04-21 DIAGNOSIS — F3341 Major depressive disorder, recurrent, in partial remission: Secondary | ICD-10-CM | POA: Diagnosis not present

## 2018-04-21 DIAGNOSIS — E66812 Obesity, class 2: Secondary | ICD-10-CM

## 2018-04-21 DIAGNOSIS — Z6837 Body mass index (BMI) 37.0-37.9, adult: Secondary | ICD-10-CM

## 2018-04-21 MED ORDER — ESCITALOPRAM OXALATE 10 MG PO TABS: 10.0000 mg | ORAL_TABLET | Freq: Every day | ORAL | 2 refills | Status: DC

## 2018-04-21 MED ORDER — BISOPROLOL-HYDROCHLOROTHIAZIDE 5-6.25 MG PO TABS: ORAL_TABLET | ORAL | 2 refills | Status: DC

## 2018-04-21 NOTE — Progress Notes (Signed)
Subjective:    Patient ID: Sherry Dennis, female    DOB: March 25, 1961, 57 y.o.   MRN: [id number]  HPI 57 y.o. obese WF with history of HTN, Thyroid disease, PSVT, RA, COPD presents with decreased concentration. Suppose to have sleep study but never did.   She has decreased motivation, can not focus or concentrate, she states she has fatigue, feels more anger, She can concentrate and complete tasks at work. Has been on wellbutrin in the past without help.   BP Readings from Last 3 Encounters:  04/21/18 (!) 140/96  03/04/18 (!) 170/100  03/04/18 (!) 142/94   She is on thyroid medication. Her medication was changed last visit, recently increased to 159mcg daily, she does take it with her RA med and with benicar in the AM.   Lab Results  Component Value Date   TSH 8.99 (H) 04/17/2018  .   Blood pressure (!) 140/96, pulse 96, temperature (!) 97.4 F (36.3 C), resp. rate 16, height 5\' 6"  (1.676 m), weight 230 lb (104.3 kg), SpO2 96 %.  Medications Current Outpatient Medications on File Prior to Visit  Medication Sig  . acetaminophen (TYLENOL) 650 MG CR tablet Take 1,300 mg by mouth 2 (two) times daily as needed for pain.  . Adalimumab (HUMIRA) 40 MG/0.8ML PSKT Inject 40 mg into the skin every 14 (fourteen) days.   . Cholecalciferol (VITAMIN D3) 10000 units capsule Take 10,000 Units by mouth daily.  . diphenhydrAMINE (BENADRYL) 25 MG tablet Take 25 mg by mouth daily.   Marland Kitchen leflunomide (ARAVA) 20 MG tablet Take 20 mg by mouth daily.  Marland Kitchen levothyroxine (SYNTHROID, LEVOTHROID) 125 MCG tablet Take 1 tablet daily on an empty stomach with only water for 30 minutes & no antacids  / Zantac etc for 4 hours  . NON FORMULARY Apply 1 application topically at bedtime. Dermatologist Office Cream for Rosacea  . olmesartan (BENICAR) 40 MG tablet Take 1 tablet daily for BP  . phentermine (ADIPEX-P) 37.5 MG tablet Take 1/2 to 1 tablet every morning for dieting & weightloss  . ranitidine (ZANTAC) 75 MG tablet  Take 75 mg by mouth daily as needed for heartburn.  . VOLTAREN 1 % GEL Apply 2 g topically 4 (four) times daily. (Patient taking differently: Apply 2 g topically 4 (four) times daily as needed (joint pain). )   No current facility-administered medications on file prior to visit.     Problem list She has Hypertension; Hyperlipidemia; Thyroid disease; Vitamin D deficiency; Obesity; Tobacco use disorder; PSVT (paroxysmal supraventricular tachycardia) ([facility]); SVT (supraventricular tachycardia) ([place]); Rheumatoid arthritis ([place]); COPD (chronic obstructive pulmonary disease) ([place]); External hemorrhoid, bleeding; Rectal pain; Aortic atherosclerosis ([place]); Adrenal adenoma; Special screening for malignant neoplasms, colon; Nausea without vomiting; Loss of weight; History of colonic polyps; and Polyp of cecum on their problem list.   Review of Systems See HPI    Objective:   Physical Exam  Constitutional: She is oriented to person, place, and time. She appears well-developed and well-nourished. No distress.  Obese  HENT:  Head: Normocephalic and atraumatic.  Right Ear: External ear normal.  Left Ear: External ear normal.  Nose: Nose normal.  Mouth/Throat: Oropharynx is clear and moist.  Eyes: Conjunctivae and EOM are normal.  Neck: Normal range of motion. Neck supple. No JVD present. No thyromegaly present.  Cardiovascular: Normal rate, regular rhythm, normal heart sounds and intact distal pulses.  Pulmonary/Chest: Effort normal and breath sounds normal.  Abdominal: Soft. Bowel sounds are normal. She exhibits no  distension and no mass. There is no tenderness. There is no rebound and no guarding.  Musculoskeletal: Normal range of motion. She exhibits no edema or tenderness.  Lymphadenopathy:    She has no cervical adenopathy.  Neurological: She is alert and oriented to person, place, and time. No cranial nerve deficit.  Skin: Skin is warm and dry. No rash noted. No erythema. No pallor.   Psychiatric: She has a normal mood and affect. Her behavior is normal. Judgment and thought content normal.  Nursing note and vitals reviewed.       Assessment & Plan:    Class 2 severe obesity due to excess calories with serious comorbidity and body mass index (BMI) of 37.0 to 37.9 in adult [facility]) -     Ambulatory referral to Sleep Studies - follow up 1 month  Sleep apnea, unspecified type -     Ambulatory referral to Sleep Studies - VERY likely contirbuting to depression, memory, concentration issues and HTN - strongly suggest treatment  Depression, major, recurrent, in partial remission ([place]) -     escitalopram (LEXAPRO) 10 MG tablet; Take 1 tablet (10 mg total) by mouth daily.  - start on new dose of thyroid med  Essential hypertension - add ziac, follow up 1 month, DASH diet, exercise and monitor at home. Call if greater than 130/80.  -     bisoprolol-hydrochlorothiazide (ZIAC) 5-6.25 MG tablet; 1/2-1 pill for blood pressure

## 2018-04-21 NOTE — Patient Instructions (Addendum)
Thyroid level is not in range. Please make sure you are taking your thyroid medication 60 mins before food with just water. Do not take it within 4 hours of calcium, magnesium, stomach medications like tums, zantac, prilosec for example.   Hypothyroidism can cause slow metabolism, fatigue, weight gain, dry hair/skin, constipation, swelling, decreased memory/brain fog.    Being dehydrated can hurt your kidneys, cause fatigue, headaches, muscle aches, joint pain, and dry skin/nails so please increase your fluids.   Drink 80-100 oz a day of water, measure it out! Eat 3 meals a day, have to do breakfast, eat protein- hard boiled eggs, protein bar like nature valley protein bar, greek yogurt like oikos triple zero, chobani 100, or light n fit greek  Can check out plantnanny app on your phone to help you keep track of your water    Fatigue Fatigue is feeling tired all of the time, a lack of energy, or a lack of motivation. Occasional or mild fatigue is often a normal response to activity or life in general. However, long-lasting (chronic) or extreme fatigue may indicate an underlying medical condition. Follow these instructions at home: Watch your fatigue for any changes. The following actions may help to lessen any discomfort you are feeling:  Talk to your health care provider about how much sleep you need each night. Try to get the required amount every night.  Take medicines only as directed by your health care provider.  Eat a healthy and nutritious diet. Ask your health care provider if you need help changing your diet.  Drink enough fluid to keep your urine clear or pale yellow.  Practice ways of relaxing, such as yoga, meditation, massage therapy, or acupuncture.  Exercise regularly.  Change situations that cause you stress. Try to keep your work and personal routine reasonable.  Do not abuse illegal drugs.  Limit alcohol intake to no more than 1 drink per day for nonpregnant  women and 2 drinks per day for men. One drink equals 12 ounces of beer, 5 ounces of wine, or 1 ounces of hard liquor.  Take a multivitamin, if directed by your health care provider.  Contact a health care provider if:  Your fatigue does not get better.  You have a fever.  You have unintentional weight loss or gain.  You have headaches.  You have difficulty: ? Falling asleep. ? Sleeping throughout the night.  You feel angry, guilty, anxious, or sad.  You are unable to have a bowel movement (constipation).  You skin is dry.  Your legs or another part of your body is swollen. Get help right away if:  You feel confused.  Your vision is blurry.  You feel faint or pass out.  You have a severe headache.  You have severe abdominal, pelvic, or back pain.  You have chest pain, shortness of breath, or an irregular or fast heartbeat.  You are unable to urinate or you urinate less than normal.  You develop abnormal bleeding, such as bleeding from the rectum, vagina, nose, lungs, or nipples.  You vomit blood.  You have thoughts about harming yourself or committing suicide.  You are worried that you might harm someone else. This information is not intended to replace advice given to you by your health care provider. Make sure you discuss any questions you have with your health care provider. Document Released: 05/27/2007 Document Revised: 01/05/2016 Document Reviewed: 12/01/2013 Elsevier Interactive Patient Education  2018 [place] INFORMATION  Monitor your  blood pressure at home, please keep a record and bring that in with you to your next office visit.   Go to the ER if any CP, SOB, nausea, dizziness, severe HA, changes vision/speech  Due to a recent study, SPRINT, we have changed our goal for the systolic or top blood pressure number. Ideally we want your top number at 120.  In the [facility] Trial, 5000 people were randomized to a goal BP of 120 and  5000 people were randomized to a goal BP of less than 140. The patients with the goal BP at 120 had LESS DEMENTIA, LESS HEART ATTACKS, AND LESS STROKES, AS WELL AS OVERALL DECREASED MORTALITY OR DEATH RATE.   If you are willing, our goal BP is the top number of 120.  Your most recent BP: BP: (!) 140/96   Take your medications faithfully as instructed. Maintain a healthy weight. Get at least 150 minutes of aerobic exercise per week. Minimize salt intake. Minimize alcohol intake  DASH Eating Plan DASH stands for "Dietary Approaches to Stop Hypertension." The DASH eating plan is a healthy eating plan that has been shown to reduce high blood pressure (hypertension). Additional health benefits may include reducing the risk of type 2 diabetes mellitus, heart disease, and stroke. The DASH eating plan may also help with weight loss. WHAT DO I NEED TO KNOW ABOUT THE DASH EATING PLAN? For the DASH eating plan, you will follow these general guidelines:  Choose foods with a percent daily value for sodium of less than 5% (as listed on the food label).  Use salt-free seasonings or herbs instead of table salt or sea salt.  Check with your health care provider or pharmacist before using salt substitutes.  Eat lower-sodium products, often labeled as "lower sodium" or "no salt added."  Eat fresh foods.  Eat more vegetables, fruits, and low-fat dairy products.  Choose whole grains. Look for the word "whole" as the first word in the ingredient list.  Choose fish and skinless chicken or [place] more often than red meat. Limit fish, poultry, and meat to 6 oz (170 g) each day.  Limit sweets, desserts, sugars, and sugary drinks.  Choose heart-healthy fats.  Limit cheese to 1 oz (28 g) per day.  Eat more home-cooked food and less restaurant, buffet, and fast food.  Limit fried foods.  Trefz foods using methods other than frying.  Limit canned vegetables. If you do use them, rinse them well to  decrease the sodium.  When eating at a restaurant, ask that your food be prepared with less salt, or no salt if possible. WHAT FOODS CAN I EAT? Seek help from a dietitian for individual calorie needs. Grains Whole grain or whole wheat bread. Brown rice. Whole grain or whole wheat pasta. Quinoa, bulgur, and whole grain cereals. Low-sodium cereals. Corn or whole wheat flour tortillas. Whole grain cornbread. Whole grain crackers. Low-sodium crackers. Vegetables Fresh or frozen vegetables (raw, steamed, roasted, or grilled). Low-sodium or reduced-sodium tomato and vegetable juices. Low-sodium or reduced-sodium tomato sauce and paste. Low-sodium or reduced-sodium canned vegetables.  Fruits All fresh, canned (in natural juice), or frozen fruits. Meat and Other Protein Products Ground beef (85% or leaner), grass-fed beef, or beef trimmed of fat. Skinless chicken or [place]. Ground chicken or [place]. Pork trimmed of fat. All fish and seafood. Eggs. Dried beans, peas, or lentils. Unsalted nuts and seeds. Unsalted canned beans. Dairy Low-fat dairy products, such as skim or 1% milk, 2% or reduced-fat cheeses, low-fat ricotta or  cottage cheese, or plain low-fat yogurt. Low-sodium or reduced-sodium cheeses. Fats and Oils Tub margarines without trans fats. Light or reduced-fat mayonnaise and salad dressings (reduced sodium). Avocado. Safflower, olive, or canola oils. Natural peanut or almond butter. Other Unsalted popcorn and pretzels. The items listed above may not be a complete list of recommended foods or beverages. Contact your dietitian for more options. WHAT FOODS ARE NOT RECOMMENDED? Grains White bread. White pasta. White rice. Refined cornbread. Bagels and croissants. Crackers that contain trans fat. Vegetables Creamed or fried vegetables. Vegetables in a cheese sauce. Regular canned vegetables. Regular canned tomato sauce and paste. Regular tomato and vegetable juices. Fruits Dried fruits. Canned  fruit in light or heavy syrup. Fruit juice. Meat and Other Protein Products Fatty cuts of meat. Ribs, chicken wings, bacon, sausage, bologna, salami, chitterlings, fatback, hot dogs, bratwurst, and packaged luncheon meats. Salted nuts and seeds. Canned beans with salt. Dairy Whole or 2% milk, cream, half-and-half, and cream cheese. Whole-fat or sweetened yogurt. Full-fat cheeses or blue cheese. Nondairy creamers and whipped toppings. Processed cheese, cheese spreads, or cheese curds. Condiments Onion and garlic salt, seasoned salt, table salt, and sea salt. Canned and packaged gravies. Worcestershire sauce. Tartar sauce. Barbecue sauce. Teriyaki sauce. Soy sauce, including reduced sodium. Steak sauce. Fish sauce. Oyster sauce. Cocktail sauce. Horseradish. Ketchup and mustard. Meat flavorings and tenderizers. Bouillon cubes. Hot sauce. Tabasco sauce. Marinades. Taco seasonings. Relishes. Fats and Oils Butter, stick margarine, lard, shortening, ghee, and bacon fat. Coconut, palm kernel, or palm oils. Regular salad dressings. Other Pickles and olives. Salted popcorn and pretzels. The items listed above may not be a complete list of foods and beverages to avoid. Contact your dietitian for more information. WHERE CAN I FIND MORE INFORMATION? National Heart, Lung, and Blood Institute: travelstabloid.com Document Released: 07/19/2011 Document Revised: 12/14/2013 Document Reviewed: 06/03/2013 [facility] Patient Information 2015 [place], [place]. This information is not intended to replace advice given to you by your health care provider. Make sure you discuss any questions you have with your health care provider.

## 2018-04-29 ENCOUNTER — Other Ambulatory Visit: Payer: Self-pay | Admitting: Adult Health

## 2018-04-29 DIAGNOSIS — Z79899 Other long term (current) drug therapy: Secondary | ICD-10-CM | POA: Diagnosis not present

## 2018-04-29 DIAGNOSIS — M5030 Other cervical disc degeneration, unspecified cervical region: Secondary | ICD-10-CM | POA: Diagnosis not present

## 2018-04-29 DIAGNOSIS — M0579 Rheumatoid arthritis with rheumatoid factor of multiple sites without organ or systems involvement: Secondary | ICD-10-CM | POA: Diagnosis not present

## 2018-05-12 NOTE — Progress Notes (Signed)
Assessment and Plan:  Essential hypertension - continue medications, DASH diet, exercise and monitor at home. Call if greater than 130/80.  -     CBC with Differential/Platelet -     BASIC METABOLIC PANEL WITH GFR -     Hepatic function panel  PSVT (paroxysmal supraventricular tachycardia) (HCC) Controlled, monitor, get sleep study  Thyroid disease Continue to follow up Dr. Darnell Level  Mixed hyperlipidemia -continue medications, check lipids, decrease fatty foods, increase activity.  -     Lipid panel  Tobacco use disorder Smoking cessation-  instruction/counseling given, counseled patient on the dangers of tobacco use, advised patient to stop smoking, and reviewed strategies to maximize success, patient not ready to quit at this time.   Vitamin D deficiency  Class 3 severe obesity due to excess calories with serious comorbidity and body mass index (BMI) of 40.0 to 44.9 in adult [facility]) - long discussion about weight loss, diet, and exercise -    Getting sleep study in Jan  Prediabetes -     Hemoglobin A1c  Medication management -     Magnesium  Depression Increase lexapro, doing well  Continue diet and meds as discussed. Further disposition pending results of labs. Future Appointments  Date Time Provider [place]  08/04/2018  3:30 PM Dohmeier, Asencion Partridge, MD [id number] None  08/25/2018  3:00 PM Vicie Mutters, PA-C [name] None    HPI 57 y.o. female  presents for 3 month follow up with hypertension, hyperlipidemia, prediabetes and vitamin D. Her blood pressure has been controlled at home, her BP is doing better with ziac, today their BP is BP: 120/76  She was started on the lexapro last visit 10mg  and she is doing better.  She does not workout.  She denies chest pain, shortness of breath, dizziness.  She is not on cholesterol medication and denies myalgias. Her cholesterol is at goal. The cholesterol last visit was:   Lab Results  Component Value Date   CHOL 181  02/06/2018   HDL 46 (L) 02/06/2018   LDLCALC 113 (H) 02/06/2018   TRIG 112 02/06/2018   CHOLHDL 3.9 02/06/2018   She has been working on diet and exercise for prediabetes, and denies paresthesia of the feet, polydipsia and polyuria. Last A1C in the office was:  Lab Results  Component Value Date   HGBA1C 5.6 02/06/2018   Patient is on Vitamin D supplement, she is on 10,000.   Lab Results  Component Value Date   VD25OH 15 (L) 02/06/2018     She is on thyroid medication. Her medication was not changed, taking 154mcg daily with water 1 hour before other things.  Lab Results  Component Value Date   TSH 8.99 (H) 04/17/2018  .  BMI is Body mass index is 37.12 kg/m. She wakes up frequently, does not know why, fatigue with waking up. + snoring- HAS APPOINTMENT IN DEC Wt Readings from Last 3 Encounters:  05/13/18 230 lb (104.3 kg)  04/21/18 230 lb (104.3 kg)  03/04/18 228 lb (103.4 kg)    Current Medications:  Current Outpatient Medications on File Prior to Visit  Medication Sig Dispense Refill  . acetaminophen (TYLENOL) 650 MG CR tablet Take 1,300 mg by mouth 2 (two) times daily as needed for pain.    . Adalimumab (HUMIRA) 40 MG/0.8ML PSKT Inject 40 mg into the skin every 14 (fourteen) days.     . bisoprolol-hydrochlorothiazide (ZIAC) 5-6.25 MG tablet 1/2-1 pill for blood pressure 30 tablet 2  . Cholecalciferol (VITAMIN D3)  10000 units capsule Take 10,000 Units by mouth daily.    . diphenhydrAMINE (BENADRYL) 25 MG tablet Take 25 mg by mouth daily.     Marland Kitchen escitalopram (LEXAPRO) 10 MG tablet Take 1 tablet (10 mg total) by mouth daily. 30 tablet 2  . leflunomide (ARAVA) 20 MG tablet Take 20 mg by mouth daily.    Marland Kitchen levothyroxine (SYNTHROID, LEVOTHROID) 125 MCG tablet Take 1 tablet daily on an empty stomach with only water for 30 minutes & no antacids  / Zantac etc for 4 hours 30 tablet 1  . NON FORMULARY Apply 1 application topically at bedtime. Dermatologist Office Cream for Rosacea    .  olmesartan (BENICAR) 20 MG tablet TAKE 2 TABLETS BY MOUTH DAILY=40 MG DAILY. 60 tablet 1  . olmesartan (BENICAR) 40 MG tablet Take 1 tablet daily for BP 90 tablet 1  . phentermine (ADIPEX-P) 37.5 MG tablet Take 1/2 to 1 tablet every morning for dieting & weightloss 30 tablet 5  . ranitidine (ZANTAC) 75 MG tablet Take 75 mg by mouth daily as needed for heartburn.    . VOLTAREN 1 % GEL Apply 2 g topically 4 (four) times daily. (Patient taking differently: Apply 2 g topically 4 (four) times daily as needed (joint pain). ) 4 Tube 2   No current facility-administered medications on file prior to visit.    Medical History:  Past Medical History:  Diagnosis Date  . Allergy   . Arthritis    "knees, hands, ankles, ~ every joint" (04/08/2015)  . Basal cell carcinoma    "face, hands, chest" (04/08/2015)  . Chronic bronchitis ([place])    "get it pretty much q yr" (04/08/2015)  . Dysrhythmia    palpitations occ; SVT 09/2013 s/p adenosine  . GERD (gastroesophageal reflux disease)   . H/O hiatal hernia   . Hx of cardiovascular stress test    Lexiscan Myoview 6/16: Ejection fraction is 60% and wall motion is normal. The study is normal. There is no scar or ischemia. This is a low risk scan.  . Hyperlipidemia   . Hypertension   . Hypothyroidism   . Palpitations   . Pneumonia 2015 X 1  . PONV (postoperative nausea and vomiting)   . S/P total knee arthroplasty 02/15/2014  . Thyroid disease   . Vitamin D deficiency    Allergies:  Allergies  Allergen Reactions  . Darvocet [Propoxyphene N-Acetaminophen] Anaphylaxis  . Darvon [Propoxyphene Hcl] Anaphylaxis and Other (See Comments)    Airway swelling   . Propoxyphene Anaphylaxis    Throat closes up  . Doxycycline Nausea Only  . Tamiflu [Oseltamivir Phosphate] Hives and Other (See Comments)    Increased blood pressure    Review of Systems  Constitutional: Positive for malaise/fatigue. Negative for chills, diaphoresis, fever and weight loss.  HENT:  Negative.   Eyes: Negative.   Respiratory: Negative.   Cardiovascular: Negative.   Gastrointestinal: Negative.   Genitourinary: Negative.   Musculoskeletal: Positive for neck pain. Negative for back pain, falls, joint pain and myalgias.  Skin: Negative.   Neurological: Positive for tingling and sensory change (bilateral arms from neck). Negative for dizziness, tremors, speech change, focal weakness, seizures, loss of consciousness, weakness and headaches.  Psychiatric/Behavioral: Positive for depression. Negative for hallucinations, memory loss, substance abuse and suicidal ideas. The patient has insomnia. The patient is not nervous/anxious.      Family history- Review and unchanged Social history- Review and unchanged Physical Exam: BP 120/76   Pulse 66   Temp (!)  97.3 F (36.3 C)   Ht 5\' 6"  (1.676 m)   Wt 230 lb (104.3 kg)   SpO2 96%   BMI 37.12 kg/m  Wt Readings from Last 3 Encounters:  05/13/18 230 lb (104.3 kg)  04/21/18 230 lb (104.3 kg)  03/04/18 228 lb (103.4 kg)   General Appearance: Well nourished, in no apparent distress. Eyes: PERRLA, EOMs, conjunctiva no swelling or erythema Sinuses: No Frontal/maxillary tenderness ENT/Mouth: Ext aud canals clear, TMs without erythema, bulging. No erythema, swelling, or exudate on post pharynx.  Tonsils not swollen or erythematous. Hearing normal.  Neck: Supple, thyroid normal.  Respiratory: Respiratory effort normal, BS equal bilaterally without rales, rhonchi, wheezing or stridor.  Cardio: RRR with no MRGs. Brisk peripheral pulses without edema.  Abdomen: Soft, + BS, obese Non tender, no guarding, rebound, hernias, masses. Lymphatics: Non tender without lymphadenopathy.  Musculoskeletal: Full ROM, 5/5 strength, antalgic gait Skin: Warm, dry without rashes, lesions, ecchymosis.  Neuro: Cranial nerves intact. Normal muscle tone, no cerebellar symptoms. Sensation intact.  Psych: Awake and oriented X 3, normal affect, Insight and  Judgment appropriate.    Vicie Mutters, PA-C 3:49 PM [facility]

## 2018-05-13 ENCOUNTER — Ambulatory Visit: Payer: Self-pay | Admitting: Adult Health

## 2018-05-13 ENCOUNTER — Ambulatory Visit (INDEPENDENT_AMBULATORY_CARE_PROVIDER_SITE_OTHER): Payer: 59 | Admitting: Physician Assistant

## 2018-05-13 ENCOUNTER — Encounter: Payer: Self-pay | Admitting: Physician Assistant

## 2018-05-13 VITALS — BP 120/76 | HR 66 | Temp 97.3°F | Ht 66.0 in | Wt 230.0 lb

## 2018-05-13 DIAGNOSIS — J449 Chronic obstructive pulmonary disease, unspecified: Secondary | ICD-10-CM

## 2018-05-13 DIAGNOSIS — I700 Atherosclerosis of aorta: Secondary | ICD-10-CM | POA: Diagnosis not present

## 2018-05-13 DIAGNOSIS — E079 Disorder of thyroid, unspecified: Secondary | ICD-10-CM

## 2018-05-13 DIAGNOSIS — Z79899 Other long term (current) drug therapy: Secondary | ICD-10-CM

## 2018-05-13 DIAGNOSIS — I10 Essential (primary) hypertension: Secondary | ICD-10-CM

## 2018-05-13 DIAGNOSIS — E559 Vitamin D deficiency, unspecified: Secondary | ICD-10-CM

## 2018-05-13 DIAGNOSIS — E782 Mixed hyperlipidemia: Secondary | ICD-10-CM | POA: Diagnosis not present

## 2018-05-13 DIAGNOSIS — M069 Rheumatoid arthritis, unspecified: Secondary | ICD-10-CM

## 2018-05-13 DIAGNOSIS — I471 Supraventricular tachycardia: Secondary | ICD-10-CM | POA: Diagnosis not present

## 2018-05-13 DIAGNOSIS — F17200 Nicotine dependence, unspecified, uncomplicated: Secondary | ICD-10-CM

## 2018-05-13 MED ORDER — VOLTAREN 1 % TD GEL: 2.0000 g | Freq: Four times a day (QID) | TRANSDERMAL | 2 refills | Status: DC | PRN

## 2018-05-13 MED ORDER — ESCITALOPRAM OXALATE 20 MG PO TABS: 20.0000 mg | ORAL_TABLET | Freq: Every day | ORAL | 1 refills | Status: DC

## 2018-05-13 NOTE — Patient Instructions (Signed)
    When it comes to diets, agreement about the perfect plan isn't easy to find, even among the experts. Experts at the [employer] developed an idea known as the Healthy Eating Plate. Just imagine a plate divided into logical, healthy portions.  The emphasis is on diet quality:  Load up on vegetables and fruits - one-half of your plate: Aim for color and variety, and remember that potatoes don't count.  Go for whole grains - one-quarter of your plate: Whole wheat, barley, wheat berries, quinoa, oats, brown rice, and foods made with them. If you want pasta, go with whole wheat pasta.  Protein power - one-quarter of your plate: Fish, chicken, beans, and nuts are all healthy, versatile protein sources. Limit red meat.  The diet, however, does go beyond the plate, offering a few other suggestions.  Use healthy plant oils, such as olive, canola, soy, corn, sunflower and peanut. Check the labels, and avoid partially hydrogenated oil, which have unhealthy trans fats.  If you're thirsty, drink water. Coffee and tea are good in moderation, but skip sugary drinks and limit milk and dairy products to one or two daily servings.  The type of carbohydrate in the diet is more important than the amount. Some sources of carbohydrates, such as vegetables, fruits, whole grains, and beans--are healthier than others.  Finally, stay active.  

## 2018-05-14 LAB — COMPLETE METABOLIC PANEL WITH GFR
AG Ratio: 1.7 (calc) (ref 1.0–2.5)
ALKALINE PHOSPHATASE (APISO): 81 U/L (ref 33–130)
ALT: 20 U/L (ref 6–29)
AST: 17 U/L (ref 10–35)
Albumin: 4.1 g/dL (ref 3.6–5.1)
BILIRUBIN TOTAL: 0.3 mg/dL (ref 0.2–1.2)
BUN: 19 mg/dL (ref 7–25)
CO2: 26 mmol/L (ref 20–32)
Calcium: 9.6 mg/dL (ref 8.6–10.4)
Chloride: 104 mmol/L (ref 98–110)
Creat: 0.81 mg/dL (ref 0.50–1.05)
GFR, Est African American: 95 mL/min/{1.73_m2} (ref >=60)
GFR, Est Non African American: 82 mL/min/{1.73_m2} (ref >=60)
GLOBULIN: 2.4 g/dL (ref 1.9–3.7)
Glucose, Bld: 91 mg/dL (ref 65–99)
Potassium: 4 mmol/L (ref 3.5–5.3)
SODIUM: 140 mmol/L (ref 135–146)
Total Protein: 6.5 g/dL (ref 6.1–8.1)

## 2018-05-14 LAB — CBC WITH DIFFERENTIAL/PLATELET
BASOS ABS: 40 {cells}/uL (ref 0–200)
Basophils Relative: 0.6 %
EOS ABS: 127 {cells}/uL (ref 15–500)
Eosinophils Relative: 1.9 %
HCT: 35.6 % (ref 35.0–45.0)
Hemoglobin: 12 g/dL (ref 11.7–15.5)
Lymphs Abs: 2191 cells/uL (ref 850–3900)
MCH: 32.3 pg (ref 27.0–33.0)
MCHC: 33.7 g/dL (ref 32.0–36.0)
MCV: 96 fL (ref 80.0–100.0)
MONOS PCT: 12.2 %
MPV: 12.7 fL — ABNORMAL HIGH (ref 7.5–12.5)
NEUTROS ABS: 3524 {cells}/uL (ref 1500–7800)
NEUTROS PCT: 52.6 %
Platelets: 173 10*3/uL (ref 140–400)
RBC: 3.71 10*6/uL — ABNORMAL LOW (ref 3.80–5.10)
RDW: 12 % (ref 11.0–15.0)
Total Lymphocyte: 32.7 %
WBC mixed population: 817 cells/uL (ref 200–950)
WBC: 6.7 10*3/uL (ref 3.8–10.8)

## 2018-05-14 LAB — VITAMIN D 25 HYDROXY (VIT D DEFICIENCY, FRACTURES): VIT D 25 HYDROXY: 33 ng/mL (ref 30–100)

## 2018-05-14 LAB — LIPID PANEL
CHOL/HDL RATIO: 4.2 (calc) (ref <5.0)
CHOLESTEROL: 197 mg/dL (ref <200)
HDL: 47 mg/dL — AB (ref >50)
LDL CHOLESTEROL (CALC): 116 mg/dL — AB
Non-HDL Cholesterol (Calc): 150 mg/dL (calc) — ABNORMAL HIGH (ref <130)
Triglycerides: 224 mg/dL — ABNORMAL HIGH (ref <150)

## 2018-05-14 LAB — TSH: TSH: 0.29 mIU/L — ABNORMAL LOW

## 2018-05-14 LAB — MAGNESIUM: Magnesium: 2.1 mg/dL (ref 1.5–2.5)

## 2018-05-27 ENCOUNTER — Encounter: Payer: Self-pay | Admitting: Physician Assistant

## 2018-05-28 IMAGING — CT CT ABD-PELV W/ CM
2 of 5 series · 17 of 46 positions shown, 19 images · IV contrast (APPLIED)
Comparison: None.

CLINICAL DATA: Nausea and vomiting.

EXAM:
CT ABDOMEN AND PELVIS WITH CONTRAST
TECHNIQUE: Multidetector CT imaging of the abdomen and pelvis was performed
using the standard protocol following bolus administration of
intravenous contrast.
CONTRAST:  100mL 4M1NPI-PXX IOPAMIDOL (4M1NPI-PXX) INJECTION 61%

[Series 3: abd/ pelvis 5.0 i30f 2 · axial · 0.83mm/px · z∈[+1054,+1464]mm · 14 of 92 slices shown, 16 images]
[im 5/92  soft-tissue]
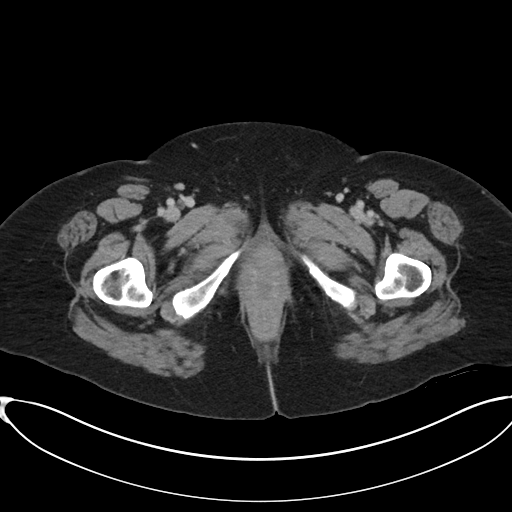
[im 5/92  bone]
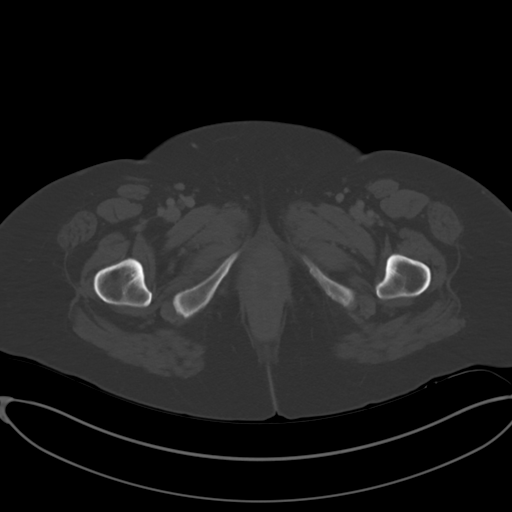
[im 14/92  soft-tissue]
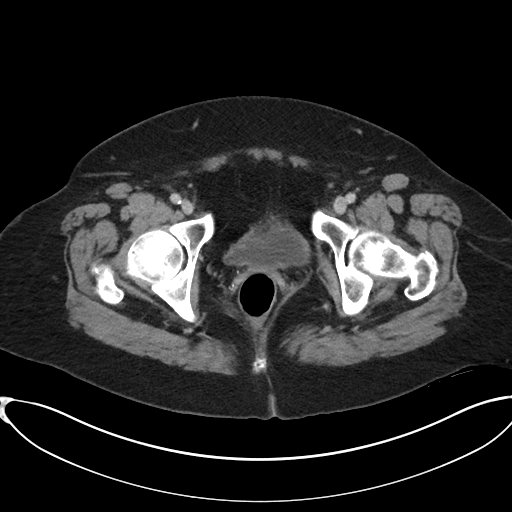
[im 19/92  soft-tissue]
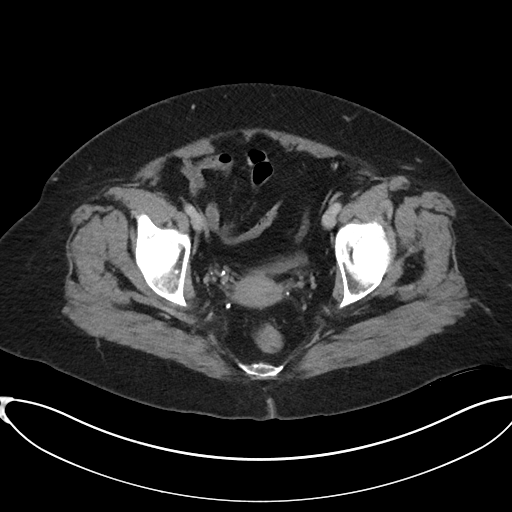
[im 23/92  soft-tissue]
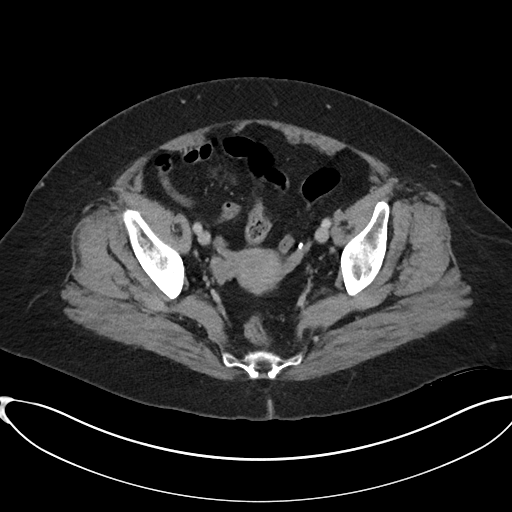
[im 32/92  soft-tissue]
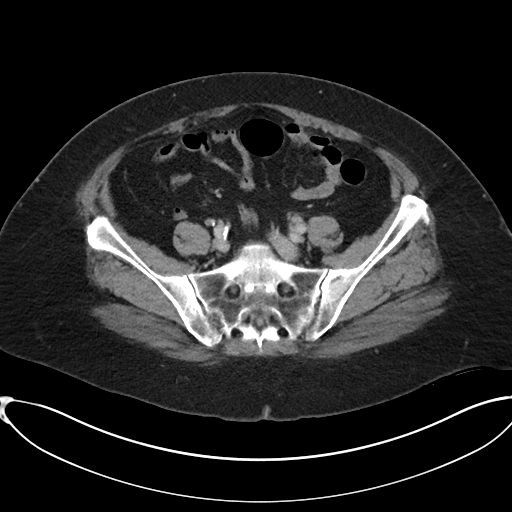
[im 37/92  soft-tissue]
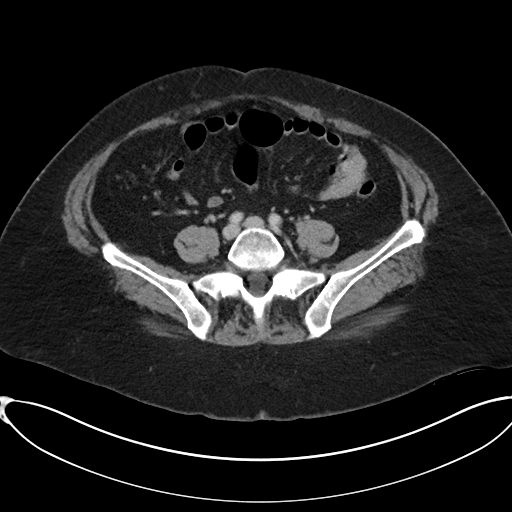
[im 41/92  soft-tissue]
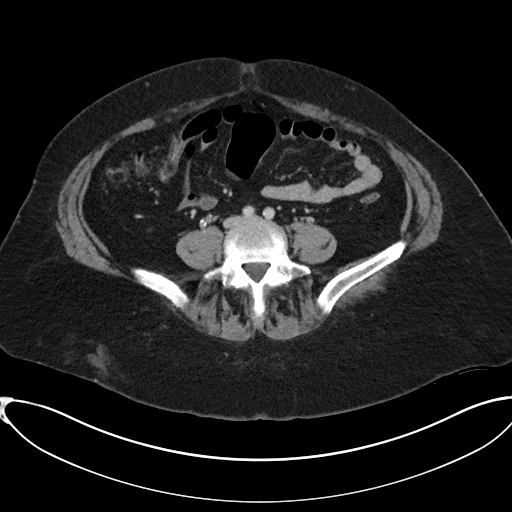
[im 51/92  soft-tissue]
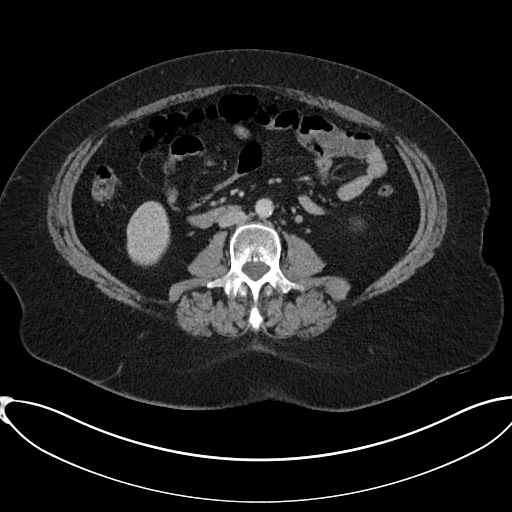
[im 55/92  soft-tissue]
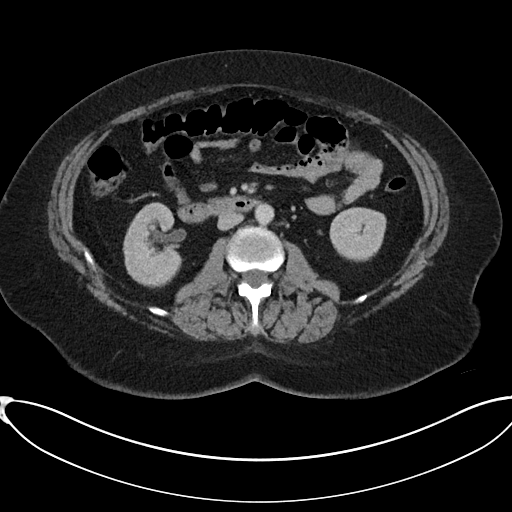
[im 55/92  bone]
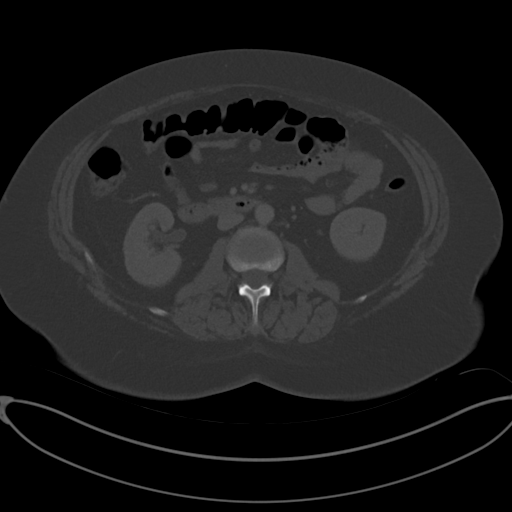
[im 60/92  soft-tissue]
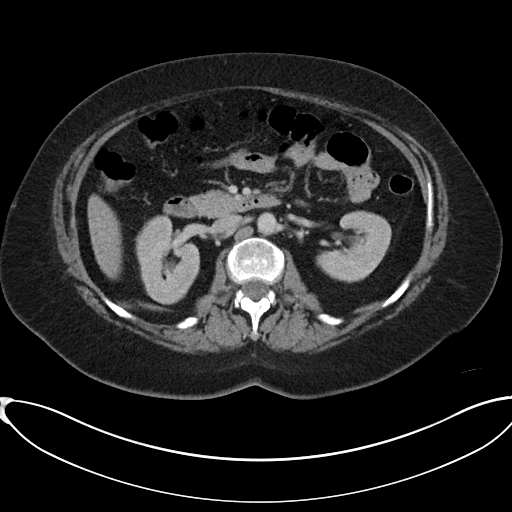
[im 69/92  soft-tissue]
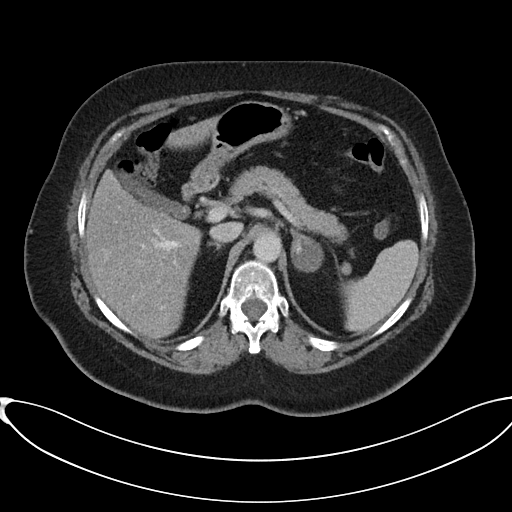
[im 73/92  soft-tissue]
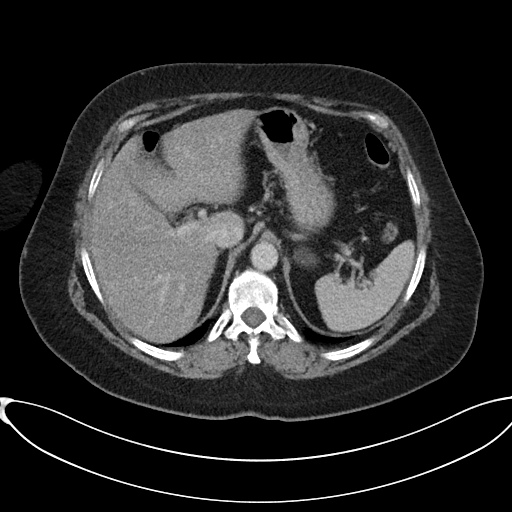
[im 78/92  soft-tissue]
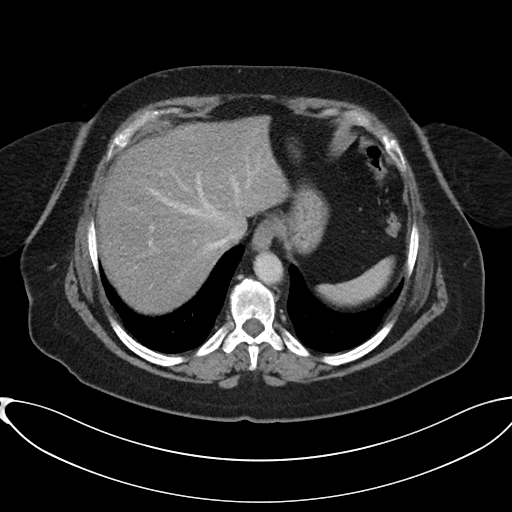
[im 87/92  soft-tissue]
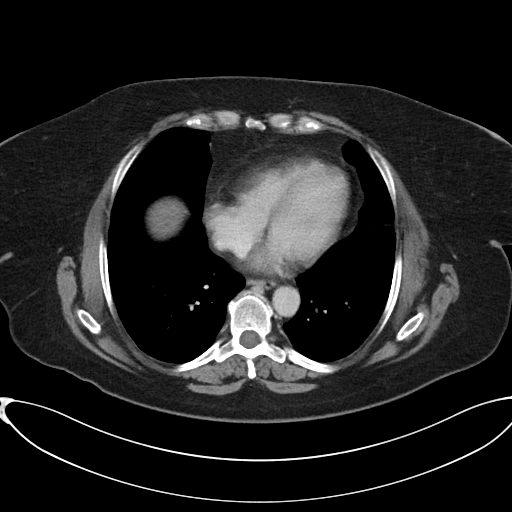

[Series 6: coronal soft tissue · coronal · 0.89mm/px · 3 of 101 slices shown]
[im 34/101  soft-tissue]
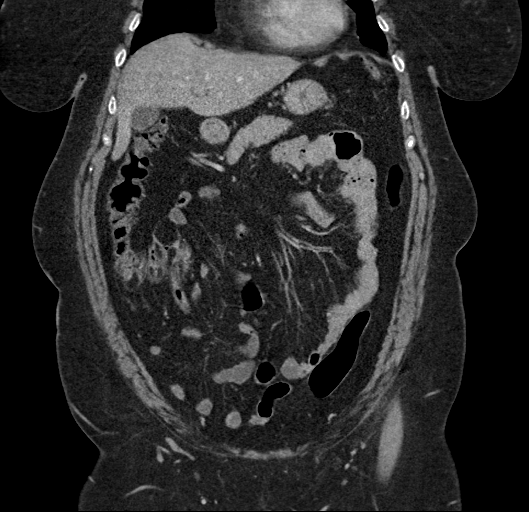
[im 45/101  soft-tissue]
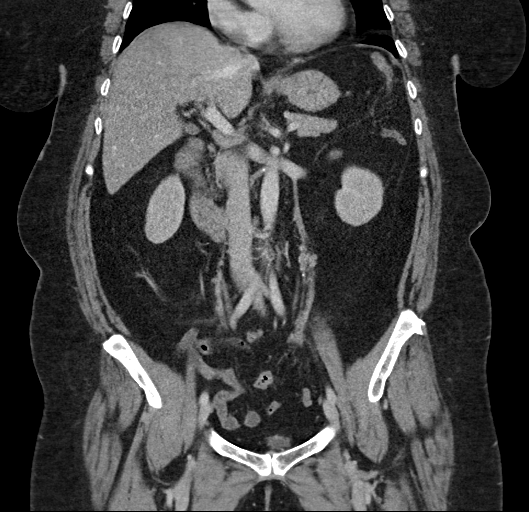
[im 56/101  soft-tissue]
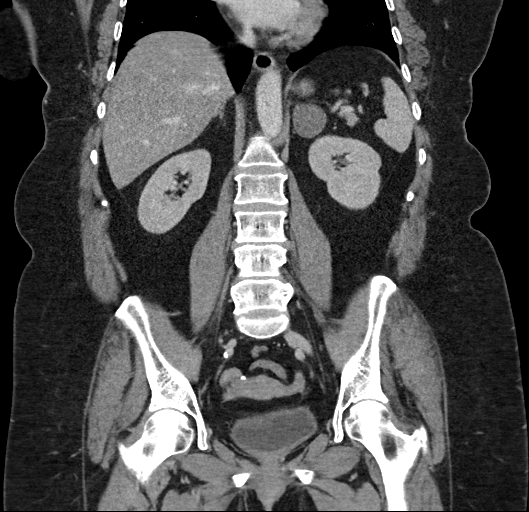

[17 of 46 positions shown; findings below may reference images not displayed]

FINDINGS: Lower chest: No acute abnormality.

Hepatobiliary: No focal liver abnormality is seen. No gallstones,
gallbladder wall thickening, or biliary dilatation.

Pancreas: Unremarkable. No pancreatic ductal dilatation or
surrounding inflammatory changes.

Spleen: Normal in size without focal abnormality.

Adrenals/Urinary Tract: Normal appearance of the right adrenal
gland. Nodule in the left adrenal gland is indeterminate measuring
3.5 cm and 24 HU. Unremarkable appearance of both kidneys. No mass
or hydronephrosis. The urinary bladder is normal.

Stomach/Bowel: Stomach is normal. The small bowel loops have a
normal course and caliber without evidence for bowel obstruction.
The appendix is not visualized. No pathologic dilatation of the
colon. No bowel wall thickening or inflammation identified.

Vascular/Lymphatic: Aortic atherosclerosis. No aneurysm. No
abdominal or pelvic adenopathy.

Reproductive: Uterus and bilateral adnexa are unremarkable.

Other: No abdominal wall hernia or abnormality. No abdominopelvic
ascites.

Musculoskeletal: No aggressive lytic or sclerotic bone lesions.
IMPRESSION: 1. No acute findings within the abdomen or pelvis.  Indeterminate
2. Indeterminate left adrenal nodule measuring 3.5 cm. Follow-up
imaging with nonemergent, adrenal protocol MRI or CT is advised.
3.  Aortic Atherosclerosis ([id number]).

## 2018-06-07 DIAGNOSIS — M069 Rheumatoid arthritis, unspecified: Secondary | ICD-10-CM | POA: Diagnosis not present

## 2018-06-12 ENCOUNTER — Other Ambulatory Visit: Payer: Self-pay | Admitting: Internal Medicine

## 2018-06-12 DIAGNOSIS — E039 Hypothyroidism, unspecified: Secondary | ICD-10-CM

## 2018-07-08 ENCOUNTER — Other Ambulatory Visit: Payer: Self-pay | Admitting: Physician Assistant

## 2018-07-20 ENCOUNTER — Other Ambulatory Visit: Payer: Self-pay | Admitting: Physician Assistant

## 2018-07-25 ENCOUNTER — Other Ambulatory Visit: Payer: Self-pay | Admitting: Internal Medicine

## 2018-07-25 DIAGNOSIS — E039 Hypothyroidism, unspecified: Secondary | ICD-10-CM

## 2018-07-30 ENCOUNTER — Encounter: Payer: Self-pay | Admitting: Physician Assistant

## 2018-07-31 DIAGNOSIS — M069 Rheumatoid arthritis, unspecified: Secondary | ICD-10-CM | POA: Diagnosis not present

## 2018-08-04 ENCOUNTER — Encounter: Payer: Self-pay | Admitting: Neurology

## 2018-08-04 ENCOUNTER — Other Ambulatory Visit: Payer: Self-pay | Admitting: Internal Medicine

## 2018-08-04 ENCOUNTER — Ambulatory Visit (INDEPENDENT_AMBULATORY_CARE_PROVIDER_SITE_OTHER): Payer: 59 | Admitting: Neurology

## 2018-08-04 VITALS — BP 165/98 | HR 65 | Ht 65.5 in | Wt 243.0 lb

## 2018-08-04 DIAGNOSIS — J449 Chronic obstructive pulmonary disease, unspecified: Secondary | ICD-10-CM

## 2018-08-04 DIAGNOSIS — J441 Chronic obstructive pulmonary disease with (acute) exacerbation: Secondary | ICD-10-CM

## 2018-08-04 DIAGNOSIS — J439 Emphysema, unspecified: Secondary | ICD-10-CM | POA: Diagnosis not present

## 2018-08-04 DIAGNOSIS — R002 Palpitations: Secondary | ICD-10-CM

## 2018-08-04 DIAGNOSIS — G4736 Sleep related hypoventilation in conditions classified elsewhere: Secondary | ICD-10-CM

## 2018-08-04 DIAGNOSIS — R351 Nocturia: Secondary | ICD-10-CM

## 2018-08-04 DIAGNOSIS — E039 Hypothyroidism, unspecified: Secondary | ICD-10-CM

## 2018-08-04 DIAGNOSIS — R0683 Snoring: Secondary | ICD-10-CM

## 2018-08-04 DIAGNOSIS — G4733 Obstructive sleep apnea (adult) (pediatric): Secondary | ICD-10-CM

## 2018-08-04 NOTE — Progress Notes (Signed)
SLEEP MEDICINE CLINIC   Provider:  Larey Seat, MD    Primary Care Physician:  Unk Pinto, MD   Referring Provider: Unk Pinto, MD    Chief Complaint  Patient presents with  . Follow-up    pt alone, rm 11. was here in 2018 and she was ordered to have sleep study completed. pt states that she never recevied a call getting her to come in for the study. she is here to restart the process. pt states that she wakes up frequently through the night. there will be times where she sleeps well and then there are times where she averages about 3-4 hours a night. pt has been told she snores. pt state that she had HST completed states that she needed to have another study completed.   alone   HPI:  Sherry Dennis is a 57 y.o. female , seen here  in a RV after last years  Referral   from Dr. Melford Aase for an evaluation of COPD overlap with possible OSA in this active smoker. She has not quit smoking, and she is still in need of a sleep study. 08-04-2018 .     Sherry Dennis reports that she was just recently diagnosed with rheumatoid arthritis, has irritable bowel syndrome, she has prediabetes, vitamin D deficiency and a low thyroid-stimulating hormone she also is considered morbidly obese at a BMI of over 40 but she has recently developed more fatigue and daytime excessive daytime sleepiness more frequently waking up from sleep and has been told that she snores. In the morning she feels like she doesn't get enough sleep is not restored or refreshed. In addition she has had some ankle edema and swelling in her feet and legs shortness of breath, wheezing alongside with snoring. Her muscles ache and her joints hurt which makes it sometimes harder to find a restful position to sleep.  She also had a partial knee replacement in 2016 and a heart ablation in 2016 for  Wolff-Parkinson-White with tachycardia and palpitations.   Sleep habits are as follows: Sherry Dennis has an established bedtime at  about 10 PM but she may not immediately go to sleep. Her average sleep time will be between 3 and 4 hours at night only. She has up to 4 or 5 bathroom breaks each night, fragmenting her sleep. She may stay asleep for about one hour at a time and is in the habit of looking at the clock when she wakes up.  Sleep medical history and family sleep history: has COPD, coughing and wheezing - The patient has developed insomnia over the last 10 years, attributed to hyperthyroidism . Her TSH is low. She struggled with thyroid disease 20 years and developed fatigue and a weight problem. She sees Dr. Kayleen Memos on [facility] on [address] after she developed swollen wrists and hands-  she tested negative for gout but high for the rheumatoid factor. She is now referred for [facility] rheumatology, Dr. Leigh Aurora,   Social history: active smoker 1.5 ppd, since 1990- ETOH- rare- none in 2-3 years. Caffeine : 1 cup in AM , 1 tea in afternoon, lunch.  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 She works for the [employer] Not exercising - she is pain and she has achilles tendonitis.   Review of Systems: Out of  a complete 14 system review, the patient complains of only the following symptoms, and all other reviewed systems are negative. Weight gain, fatigue, blurred vision, shortness of breath, wheezing, snoring, swelling in both ankles both legs, swelling of the wrist and hand, easy bruising and bleeding increased thirst, aching muscles or joint pain insomnia snoring and nocturnal coughing the feeling of not enough sleep, weakness dizziness. Epworth sleepiness score was endorsed at 15 points, fatigue severity score is endorsed at 45 points.      Social History   Socioeconomic History  . Marital status: Divorced    Spouse name: Not on file  .  Number of children: 2  . Years of education: Not on file  . Highest education level: Not on file  Occupational History  . Not on file  Social Needs  . Financial resource strain: Not on file  . Food insecurity:    Worry: Not on file    Inability: Not on file  . Transportation needs:    Medical: Not on file    Non-medical: Not on file  Tobacco Use  . Smoking status: Current Some Day Smoker    Packs/day: 1.50    Years: 40.00    Pack years: 60.00    Types: Cigarettes  . Smokeless tobacco: Never Used  . Tobacco comment: 04/08/2015 "went from ~ 2 ppd to 3 cigarettes in 1 week"  Substance and Sexual Activity  . Alcohol use: No    Alcohol/week: 0.0 standard drinks    Comment: 04/08/2015 "couple drinks/year"  . Drug use: No  . Sexual activity: Not Currently  Lifestyle  . Physical activity:    Days per week: Not on file    Minutes per session: Not on file  . Stress: Not on file  Relationships  . Social connections:    Talks on phone: Not on file    Gets together: Not on file    Attends religious service: Not on file    Active member of club or organization: Not on file    Attends meetings of clubs or organizations: Not on file    Relationship status: Not on file  . Intimate partner violence:    Fear of current or ex partner: Not on file    Emotionally abused: Not on file    Physically abused: Not on file    Forced sexual activity: Not on file  Other Topics Concern  . Not on file  Social History Narrative  . Not on file    Family History  Problem Relation Age of Onset  . Hypertension Mother   . Atrial fibrillation Mother   . Diabetes Father   . Emphysema Father   . COPD Father   . Breast cancer Paternal Aunt        x 4 aunts,   . Stroke Maternal Grandmother   . Heart attack Maternal Grandmother   . Hyperlipidemia Maternal Grandfather   . Heart disease Maternal Grandfather   . Hyperlipidemia Paternal Grandmother   . Heart disease Paternal Grandmother   .  Hyperlipidemia Paternal Grandfather   . Heart disease Paternal Grandfather   . Colon cancer Neg Hx   . Colon polyps Neg Hx   . Esophageal cancer Neg Hx   . Rectal cancer Neg Hx   . Ulcerative colitis Neg Hx   . Stomach cancer Neg Hx     Past Medical History:  Diagnosis Date  . Allergy   . Arthritis    "knees, hands, ankles, ~ every  joint" (04/08/2015)  . Basal cell carcinoma    "face, hands, chest" (04/08/2015)  . Chronic bronchitis ([place])    "get it pretty much q yr" (04/08/2015)  . Dysrhythmia    palpitations occ; SVT 09/2013 s/p adenosine  . GERD (gastroesophageal reflux disease)   . H/O hiatal hernia   . Hx of cardiovascular stress test    Lexiscan Myoview 6/16: Ejection fraction is 60% and wall motion is normal. The study is normal. There is no scar or ischemia. This is a low risk scan.  . Hyperlipidemia   . Hypertension   . Hypothyroidism   . Palpitations   . Pneumonia 2015 X 1  . PONV (postoperative nausea and vomiting)   . S/P total knee arthroplasty 02/15/2014  . Thyroid disease   . Vitamin D deficiency     Past Surgical History:  Procedure Laterality Date  . BASAL CELL CARCINOMA EXCISION  2014   "off my chest"  . BLADDER REPAIR  1993   "lasered the holes shut"  . CARDIAC SURGERY    . CARPAL TUNNEL RELEASE Right 1990  . COLONOSCOPY    . COLONOSCOPY WITH PROPOFOL N/A 11/25/2017   Procedure: COLONOSCOPY WITH PROPOFOL;  Surgeon: Mauri Pole, MD;  Location: [facility];  Service: Endoscopy;  Laterality: N/A;  . ELECTROPHYSIOLOGIC STUDY N/A 04/08/2015   Procedure: SVT Ablation;  Surgeon: Evans Lance, MD;  Location: [facility];  Service: Cardiovascular;  Laterality: N/A;  . ENDOMETRIAL ABLATION  2009  . INCONTINENCE SURGERY  2010  . JOINT REPLACEMENT    . KNEE ARTHROSCOPY Right 2013  . KNEE ARTHROSCOPY    . PARTIAL KNEE ARTHROPLASTY Right 02/15/2014   Procedure: RIGHT UNICOMPARTMENTAL KNEE (MEDIAL COMPARTMENT);  Surgeon: Vickey Huger, MD;  Location:  [facility];  Service: Orthopedics;  Laterality: Right;  . POLYPECTOMY    . TUBAL LIGATION  1987    Current Outpatient Medications  Medication Sig Dispense Refill  . acetaminophen (TYLENOL) 650 MG CR tablet Take 1,300 mg by mouth 2 (two) times daily as needed for pain.    . Adalimumab (HUMIRA) 40 MG/0.8ML PSKT Inject 40 mg into the skin every 14 (fourteen) days.     . bisoprolol-hydrochlorothiazide (ZIAC) 5-6.25 MG tablet TAKE 1/2-1 TABLET BY MOUTH DAILY FOR BLOOD PRESSURE 30 tablet 0  . Cholecalciferol (VITAMIN D3) 10000 units capsule Take 10,000 Units by mouth daily.    . diphenhydrAMINE (BENADRYL) 25 MG tablet Take 25 mg by mouth daily.     Marland Kitchen escitalopram (LEXAPRO) 20 MG tablet Take 1 tablet (20 mg total) by mouth daily. 90 tablet 1  . leflunomide (ARAVA) 20 MG tablet Take 20 mg by mouth daily.    Marland Kitchen levothyroxine (SYNTHROID) 125 MCG tablet PLEASE SEE ATTACHED FOR DETAILED DIRECTIONS PER DOCTOR 30 tablet 1  . NON FORMULARY Apply 1 application topically at bedtime. Dermatologist Office Cream for Rosacea    . olmesartan (BENICAR) 20 MG tablet TAKE 2 TABLETS (40MG ) BY MOUTH DAILY 60 tablet 1  . olmesartan (BENICAR) 40 MG tablet Take 1 tablet daily for BP 90 tablet 1  . phentermine (ADIPEX-P) 37.5 MG tablet Take 1/2 to 1 tablet every morning for dieting & weightloss 30 tablet 5  . ranitidine (ZANTAC) 75 MG tablet Take 75 mg by mouth daily as needed for heartburn.    . VOLTAREN 1 % GEL Apply 2 g topically 4 (four) times daily as needed (joint pain). 100 g 2   No current facility-administered medications for this visit.  Allergies as of 08/04/2018 - Review Complete 08/04/2018  Allergen Reaction Noted  . Darvocet [propoxyphene n-acetaminophen] Anaphylaxis 12/21/2011  . Darvon [propoxyphene hcl] Anaphylaxis and Other (See Comments) 12/21/2011  . Propoxyphene Anaphylaxis 07/21/2015  . Doxycycline Nausea Only 08/30/2017  . Tamiflu [oseltamivir phosphate] Hives and Other (See Comments) 12/21/2011      Vitals: BP (!) 165/98   Pulse 65   Ht 5' 5.5" (1.664 m)   Wt 243 lb (110.2 kg)   BMI 39.82 kg/m  Last Weight:  Wt Readings from Last 1 Encounters:  08/04/18 243 lb (110.2 kg)   JQB:HALP mass index is 39.82 kg/m.     Last Height:   Ht Readings from Last 1 Encounters:  08/04/18 5' 5.5" (1.664 m)    Physical exam:  General: The patient is awake, alert and appears not in acute distress. The patient is well groomed. Head: Normocephalic, atraumatic. Neck is supple. Mallampati 5 - the uvula is not visible, neck circumference:17.5 . Nasal airflow congested , Prognathia is seen.  Cardiovascular:  Regular rate and rhythm , without  murmurs or carotid bruit, and without distended neck veins. Respiratory:  Wheezing, coughing, bronchial restricition.  Skin:  Ankle edema, not rash Trunk: BMI is 40. The patient's posture is erect   Neurologic exam : The patient is awake and alert, oriented to place and time.    Attention span & concentration ability appears normal.  Speech is fluent, without dysarthria,but hoarse Mood and affect are appropriate.  Cranial nerves: Pupils are equal and briskly reactive to light.  Funduscopic exam without evidence of pallor or edema. Extraocular movements  in vertical and horizontal planes intact and without nystagmus. Visual fields by finger perimetry are intact. Hearing to finger rub intact.   Facial sensation intact to fine touch.  Facial motor strength is symmetric and tongue and uvula move midline. Shoulder shrug was symmetrical.   Motor exam:  Normal tone, muscle bulk and symmetric strength in all extremities. Sensory:  Fine touch, pinprick and vibration were tested in all extremities. Proprioception tested in the upper extremities was normal. Coordination: Rapid alternating movements in the fingers/hands was normal. Finger-to-nose maneuver  normal without evidence of ataxia, dysmetria or tremor. Gait and station: Patient walks without assistive  device and is able unassisted to climb up to the exam table. Strength within normal limits.  Stance is stable and normal.  Deep tendon reflexes: in the  upper and lower extremities are symmetric and intact.    Assessment:  After physical and neurologic examination, review of laboratory studies,  Personal review of imaging studies, reports of other /same  Imaging studies, results of polysomnography and / or neurophysiology testing and pre-existing records as far as provided in visit., my assessment is   1) Sherry Dennis has COPD with restricted lung capacity, pulmonary capacity. She is wheezing and has currently cough and sinus drainage. She has multiple risk factors for obstructive sleep apnea including her body mass index, neck circumference, and the visible uvula indicating a very high-grade Mallampati.   Given that she is also suffering currently from a congested nasal passage she will have to breathe through the mouth which will explain her snoring.  Her children have told her that she snores ever since they were small, so a long time before she gained weight.    there is no doubt that this patient will has apnea with a hep very high risk of hypercapnia and hypoxemia. This may explain some of the morning lightheadedness, dizziness, sometimes waking  up with palpitations of feeling short of breath.  She had her last cardiac monitor prior to her cardiac ablation procedure in 2016. However, clamminess, diaphoresis, lightheadedness and palpitations still accompanied her arousals out of sleep.  The patient was advised of the nature of the diagnosed disorder , the treatment options and the  risks for general health and wellness arising from not treating the condition.   I spent more than 45 minutes of face to face time with the patient.  Greater than 50% of time was spent in counseling and coordination of care. We have discussed the diagnosis and differential and I answered the patient's questions.     Plan:  Treatment plan and additional workup :  Sherry Dennis needs an attended sleep study with capnography and oximetry. Patient was an overlap COPD and obstructive sleep apnea disorder is at a very high risk of also producing tachy-bradycardia arrhythmia, manifesting as palpitations, shortness of breath and diaphoresis.  All the symptoms of familiar to the patient. I would like for her to have her study as soon as possible and we will meet with the results and discuss possible treatment. Her HST was done in 2015 and her PSG was denied in 2018. She never was called to schedule again.   I am sorry that Sherry Dennis had to wait over 12/15 month and had has no every visit without ever having received her preauthorization for sleep study.  Since that her insurance was not inclined to allow an attended sleep study I will now ask for a home sleep test, we have a new device though so-called watch pat which is giving [place] more data than our previous home sleep test.   If I find that the patient test positive for apnea I will be happy to treat her with CPAP, should she has not obstructive or central apneas for which there are some risk factors present, I would ask her to come to the lab then for titration.  I was able to review the last 3 encounters with her primary care physician and each she had elevated blood pressure, TSH has been high at 8.99 BMI remains elevated at this time at 37.1, her primary care physician also stated that it is very likely that sleep apnea is present and contributing to depression, memory, concentration issues and hypertension.  He strongly suggested treatment and he also recommended smoking cessation and the DASH diet.  Should her sleep study clearly indicate that she needs interventional care she will receive this before she sees me for follow-up.   Larey Seat, MD 70/17/7939, 0:30 PM  Certified in Neurology by ABPN Certified in [place] by [facility]  [facility] [address] [place], [place] [address]

## 2018-08-12 ENCOUNTER — Ambulatory Visit: Payer: 59 | Admitting: Adult Health Nurse Practitioner

## 2018-08-12 ENCOUNTER — Encounter: Payer: Self-pay | Admitting: Adult Health Nurse Practitioner

## 2018-08-12 VITALS — BP 142/88 | HR 64 | Temp 98.1°F | Ht 65.5 in | Wt 240.6 lb

## 2018-08-12 DIAGNOSIS — R42 Dizziness and giddiness: Secondary | ICD-10-CM

## 2018-08-12 DIAGNOSIS — R41842 Visuospatial deficit: Secondary | ICD-10-CM

## 2018-08-12 DIAGNOSIS — I9589 Other hypotension: Secondary | ICD-10-CM

## 2018-08-12 DIAGNOSIS — H538 Other visual disturbances: Secondary | ICD-10-CM

## 2018-08-12 DIAGNOSIS — E039 Hypothyroidism, unspecified: Secondary | ICD-10-CM

## 2018-08-12 DIAGNOSIS — Z79899 Other long term (current) drug therapy: Secondary | ICD-10-CM | POA: Diagnosis not present

## 2018-08-12 DIAGNOSIS — E861 Hypovolemia: Secondary | ICD-10-CM

## 2018-08-12 DIAGNOSIS — M0579 Rheumatoid arthritis with rheumatoid factor of multiple sites without organ or systems involvement: Secondary | ICD-10-CM | POA: Diagnosis not present

## 2018-08-12 DIAGNOSIS — M5030 Other cervical disc degeneration, unspecified cervical region: Secondary | ICD-10-CM | POA: Diagnosis not present

## 2018-08-12 DIAGNOSIS — Z6837 Body mass index (BMI) 37.0-37.9, adult: Secondary | ICD-10-CM

## 2018-08-12 MED ORDER — PHENTERMINE HCL 37.5 MG PO TABS: ORAL_TABLET | ORAL | 5 refills | Status: DC

## 2018-08-12 NOTE — Patient Instructions (Addendum)
Start taking 66m , baby aspirin daily.  We have drawn labs today and will contact via MyChart with the results  If you have these symptoms again, contact EMS and go to hospital for further evaluation.      Preventive Care for Adults  A healthy lifestyle and preventive care can promote health and wellness. Preventive health guidelines for women include the following key practices.  A routine yearly physical is a good way to check with your health care provider about your health and preventive screening. It is a chance to share any concerns and updates on your health and to receive a thorough exam.  Visit your dentist for a routine exam and preventive care every 6 months. Brush your teeth twice a day and floss once a day. Good oral hygiene prevents tooth decay and gum disease.  The frequency of eye exams is based on your age, health, family medical history, use of contact lenses, and other factors. Follow your health care provider's recommendations for frequency of eye exams.  Eat a healthy diet. Foods like vegetables, fruits, whole grains, low-fat dairy products, and lean protein foods contain the nutrients you need without too many calories. Decrease your intake of foods high in solid fats, added sugars, and salt. Eat the right amount of calories for you. Get information about a proper diet from your health care provider, if necessary.  Regular physical exercise is one of the most important things you can do for your health. Most adults should get at least 150 minutes of moderate-intensity exercise (any activity that increases your heart rate and causes you to sweat) each week. In addition, most adults need muscle-strengthening exercises on 2 or more days a week.  Maintain a healthy weight. The body mass index (BMI) is a screening tool to identify possible weight problems. It provides an estimate of body fat based on height and weight. Your health care provider can find your BMI and can help  you achieve or maintain a healthy weight. For adults 20 years and older:  A BMI below 18.5 is considered underweight.  A BMI of 18.5 to 24.9 is normal.  A BMI of 25 to 29.9 is considered overweight.  A BMI of 30 and above is considered obese.  Maintain normal blood lipids and cholesterol levels by exercising and minimizing your intake of saturated fat. Eat a balanced diet with plenty of fruit and vegetables. Blood tests for lipids and cholesterol should begin at age 3722and be repeated every 5 years. If your lipid or cholesterol levels are high, you are over 50, or you are at high risk for heart disease, you may need your cholesterol levels checked more frequently. Ongoing high lipid and cholesterol levels should be treated with medicines if diet and exercise are not working.  If you smoke, find out from your health care provider how to quit. If you do not use tobacco, do not start.  Lung cancer screening is recommended for adults aged 524-80years who are at high risk for developing lung cancer because of a history of smoking. A yearly low-dose CT scan of the lungs is recommended for people who have at least a 30-pack-year history of smoking and are a current smoker or have quit within the past 15 years. A pack year of smoking is smoking an average of 1 pack of cigarettes a day for 1 year (for example: 1 pack a day for 30 years or 2 packs a day for 15 years). Yearly screening should  continue until the smoker has stopped smoking for at least 15 years. Yearly screening should be stopped for people who develop a health problem that would prevent them from having lung cancer treatment.  High blood pressure causes heart disease and increases the risk of stroke. Your blood pressure should be checked at least every 1 to 2 years. Ongoing high blood pressure should be treated with medicines if weight loss and exercise do not work.  If you are 74-82 years old, ask your health care provider if you should take  aspirin to prevent strokes.  Diabetes screening involves taking a blood sample to check your fasting blood sugar level. This should be done once every 3 years, after age 30, if you are within normal weight and without risk factors for diabetes. Testing should be considered at a younger age or be carried out more frequently if you are overweight and have at least 1 risk factor for diabetes.  Breast cancer screening is essential preventive care for women. You should practice "breast self-awareness." This means understanding the normal appearance and feel of your breasts and may include breast self-examination. Any changes detected, no matter how small, should be reported to a health care provider. Women in their 62s and 30s should have a clinical breast exam (CBE) by a health care provider as part of a regular health exam every 1 to 3 years. After age 75, women should have a CBE every year. Starting at age 62, women should consider having a mammogram (breast X-ray test) every year. Women who have a family history of breast cancer should talk to their health care provider about genetic screening. Women at a high risk of breast cancer should talk to their health care providers about having an MRI and a mammogram every year.  Breast cancer gene (BRCA)-related cancer risk assessment is recommended for women who have family members with BRCA-related cancers. BRCA-related cancers include breast, ovarian, tubal, and peritoneal cancers. Having family members with these cancers may be associated with an increased risk for harmful changes (mutations) in the breast cancer genes BRCA1 and BRCA2. Results of the assessment will determine the need for genetic counseling and BRCA1 and BRCA2 testing.  Routine pelvic exams to screen for cancer are no longer recommended for nonpregnant women who are considered low risk for cancer of the pelvic organs (ovaries, uterus, and vagina) and who do not have symptoms. Ask your health  care provider if a screening pelvic exam is right for you.  If you have had past treatment for cervical cancer or a condition that could lead to cancer, you need Pap tests and screening for cancer for at least 20 years after your treatment. If Pap tests have been discontinued, your risk factors (such as having a new sexual partner) need to be reassessed to determine if screening should be resumed. Some women have medical problems that increase the chance of getting cervical cancer. In these cases, your health care provider may recommend more frequent screening and Pap tests.  Colorectal cancer can be detected and often prevented. Most routine colorectal cancer screening begins at the age of 41 years and continues through age 12 years. However, your health care provider may recommend screening at an earlier age if you have risk factors for colon cancer. On a yearly basis, your health care provider may provide home test kits to check for hidden blood in the stool. Use of a small camera at the end of a tube, to directly examine the colon (sigmoidoscopy  or colonoscopy), can detect the earliest forms of colorectal cancer. Talk to your health care provider about this at age 24, when routine screening begins.  Direct exam of the colon should be repeated every 5-10 years through age 35 years, unless early forms of pre-cancerous polyps or small growths are found.  Hepatitis C blood testing is recommended for all people born from [birth year] through 1963 and any individual with known risks for hepatitis C.  Pra  Osteoporosis is a disease in which the bones lose minerals and strength with aging. This can result in serious bone fractures or breaks. The risk of osteoporosis can be identified using a bone density scan. Women ages 71 years and over and women at risk for fractures or osteoporosis should discuss screening with their health care providers. Ask your health care provider whether you should take a calcium supplement  or vitamin D to reduce the rate of osteoporosis.  Menopause can be associated with physical symptoms and risks. Hormone replacement therapy is available to decrease symptoms and risks. You should talk to your health care provider about whether hormone replacement therapy is right for you.  Use sunscreen. Apply sunscreen liberally and repeatedly throughout the day. You should seek shade when your shadow is shorter than you. Protect yourself by wearing long sleeves, pants, a wide-brimmed hat, and sunglasses year round, whenever you are outdoors.  Once a month, do a whole body skin exam, using a mirror to look at the skin on your back. Tell your health care provider of new moles, moles that have irregular borders, moles that are larger than a pencil eraser, or moles that have changed in shape or color.  Stay current with required vaccines (immunizations).  Influenza vaccine. All adults should be immunized every year.  Tetanus, diphtheria, and acellular pertussis (Td, Tdap) vaccine. Pregnant women should receive 1 dose of Tdap vaccine during each pregnancy. The dose should be obtained regardless of the length of time since the last dose. Immunization is preferred during the 27th-36th week of gestation. An adult who has not previously received Tdap or who does not know her vaccine status should receive 1 dose of Tdap. This initial dose should be followed by tetanus and diphtheria toxoids (Td) booster doses every 10 years. Adults with an unknown or incomplete history of completing a 3-dose immunization series with Td-containing vaccines should begin or complete a primary immunization series including a Tdap dose. Adults should receive a Td booster every 10 years.  Varicella vaccine. An adult without evidence of immunity to varicella should receive 2 doses or a second dose if she has previously received 1 dose. Pregnant females who do not have evidence of immunity should receive the first dose after  pregnancy. This first dose should be obtained before leaving the health care facility. The second dose should be obtained 4-8 weeks after the first dose.  Human papillomavirus (HPV) vaccine. Females aged 13-26 years who have not received the vaccine previously should obtain the 3-dose series. The vaccine is not recommended for use in pregnant females. However, pregnancy testing is not needed before receiving a dose. If a female is found to be pregnant after receiving a dose, no treatment is needed. In that case, the remaining doses should be delayed until after the pregnancy. Immunization is recommended for any person with an immunocompromised condition through the age of 52 years if she did not get any or all doses earlier. During the 3-dose series, the second dose should be obtained 4-8 weeks after  the first dose. The third dose should be obtained 24 weeks after the first dose and 16 weeks after the second dose.  Zoster vaccine. One dose is recommended for adults aged 39 years or older unless certain conditions are present.  Measles, mumps, and rubella (MMR) vaccine. Adults born before 40 generally are considered immune to measles and mumps. Adults born in [birth year] or later should have 1 or more doses of MMR vaccine unless there is a contraindication to the vaccine or there is laboratory evidence of immunity to each of the three diseases. A routine second dose of MMR vaccine should be obtained at least 28 days after the first dose for students attending postsecondary schools, health care workers, or international travelers. People who received inactivated measles vaccine or an unknown type of measles vaccine during 1963-1967 should receive 2 doses of MMR vaccine. People who received inactivated mumps vaccine or an unknown type of mumps vaccine before 1979 and are at high risk for mumps infection should consider immunization with 2 doses of MMR vaccine. For females of childbearing age, rubella immunity should  be determined. If there is no evidence of immunity, females who are not pregnant should be vaccinated. If there is no evidence of immunity, females who are pregnant should delay immunization until after pregnancy. Unvaccinated health care workers born before [birth year] who lack laboratory evidence of measles, mumps, or rubella immunity or laboratory confirmation of disease should consider measles and mumps immunization with 2 doses of MMR vaccine or rubella immunization with 1 dose of MMR vaccine.  Pneumococcal 13-valent conjugate (PCV13) vaccine. When indicated, a person who is uncertain of her immunization history and has no record of immunization should receive the PCV13 vaccine. An adult aged 8 years or older who has certain medical conditions and has not been previously immunized should receive 1 dose of PCV13 vaccine. This PCV13 should be followed with a dose of pneumococcal polysaccharide (PPSV23) vaccine. The PPSV23 vaccine dose should be obtained at least 1 or more year(s) after the dose of PCV13 vaccine. An adult aged 4 years or older who has certain medical conditions and previously received 1 or more doses of PPSV23 vaccine should receive 1 dose of PCV13. The PCV13 vaccine dose should be obtained 1 or more years after the last PPSV23 vaccine dose.    Pneumococcal polysaccharide (PPSV23) vaccine. When PCV13 is also indicated, PCV13 should be obtained first. All adults aged 67 years and older should be immunized. An adult younger than age 78 years who has certain medical conditions should be immunized. Any person who resides in a nursing home or long-term care facility should be immunized. An adult smoker should be immunized. People with an immunocompromised condition and certain other conditions should receive both PCV13 and PPSV23 vaccines. People with human immunodeficiency virus (HIV) infection should be immunized as soon as possible after diagnosis. Immunization during chemotherapy or radiation  therapy should be avoided. Routine use of PPSV23 vaccine is not recommended for American Indians, [place] Natives, or people younger than 65 years unless there are medical conditions that require PPSV23 vaccine. When indicated, people who have unknown immunization and have no record of immunization should receive PPSV23 vaccine. One-time revaccination 5 years after the first dose of PPSV23 is recommended for people aged 19-64 years who have chronic kidney failure, nephrotic syndrome, asplenia, or immunocompromised conditions. People who received 1-2 doses of PPSV23 before age 1 years should receive another dose of PPSV23 vaccine at age 73 years or later if at least  5 years have passed since the previous dose. Doses of PPSV23 are not needed for people immunized with PPSV23 at or after age 25 years.  Preventive Services / Frequency   Ages 26 to 4 years  Blood pressure check.  Lipid and cholesterol check.  Lung cancer screening. / Every year if you are aged 18-80 years and have a 30-pack-year history of smoking and currently smoke or have quit within the past 15 years. Yearly screening is stopped once you have quit smoking for at least 15 years or develop a health problem that would prevent you from having lung cancer treatment.  Clinical breast exam.** / Every year after age 76 years.   BRCA-related cancer risk assessment.** / For women who have family members with a BRCA-related cancer (breast, ovarian, tubal, or peritoneal cancers).  Mammogram.** / Every year beginning at age 50 years and continuing for as long as you are in good health. Consult with your health care provider.  Pap test.** / Every 3 years starting at age 72 years through age 28 or 60 years with a history of 3 consecutive normal Pap tests.  HPV screening.** / Every 3 years from ages 42 years through ages 45 to 79 years with a history of 3 consecutive normal Pap tests.  Fecal occult blood test (FOBT) of stool. / Every year  beginning at age 26 years and continuing until age 66 years. You may not need to do this test if you get a colonoscopy every 10 years.  Flexible sigmoidoscopy or colonoscopy.** / Every 5 years for a flexible sigmoidoscopy or every 10 years for a colonoscopy beginning at age 52 years and continuing until age 79 years.  Hepatitis C blood test.** / For all people born from [birth year] through 1963 and any individual with known risks for hepatitis C.  Skin self-exam. / Monthly.  Influenza vaccine. / Every year.  Tetanus, diphtheria, and acellular pertussis (Tdap/Td) vaccine.** / Consult your health care provider. Pregnant women should receive 1 dose of Tdap vaccine during each pregnancy. 1 dose of Td every 10 years.  Varicella vaccine.** / Consult your health care provider. Pregnant females who do not have evidence of immunity should receive the first dose after pregnancy.  Zoster vaccine.** / 1 dose for adults aged 50 years or older.  Pneumococcal 13-valent conjugate (PCV13) vaccine.** / Consult your health care provider.  Pneumococcal polysaccharide (PPSV23) vaccine.** / 1 to 2 doses if you smoke cigarettes or if you have certain conditions.  Meningococcal vaccine.** / Consult your health care provider.  Hepatitis A vaccine.** / Consult your health care provider.  Hepatitis B vaccine.** / Consult your health care provider. Screening for abdominal aortic aneurysm (AAA)  by ultrasound is recommended for people over 50 who have history of high blood pressure or who are current or former smokers. ++++++++++++++++++ Recommend Adult Low Dose Aspirin or  coated  Aspirin 81 mg daily  To reduce risk of Colon Cancer 20 %,  Skin Cancer 26 % ,  Melanoma 46%  and  Pancreatic cancer 60% +++++++++++++++++++ Vitamin D goal  is between 70-100.  Please make sure that you are taking your Vitamin D as directed.  It is very important as a natural anti-inflammatory  helping hair, skin, and nails, as well as  reducing stroke and heart attack risk.  It helps your bones and helps with mood. It also decreases numerous cancer risks so please take it as directed.  Low Vit D is associated with a 200-300% higher risk  for CANCER  and 200-300% higher risk for HEART   ATTACK  &  STROKE.   .....................................Marland Kitchen It is also associated with higher death rate at younger ages,  autoimmune diseases like Rheumatoid arthritis, Lupus, Multiple Sclerosis.    Also many other serious conditions, like depression, Alzheimer's Dementia, infertility, muscle aches, fatigue, fibromyalgia - just to name a few. ++++++++++++++++++ Recommend the book "The END of DIETING" by Dr Excell Seltzer  & the book "The END of DIABETES " by Dr Excell Seltzer At [email or web address] - get book & Audio CD's    Being diabetic has a  300% increased risk for heart attack, stroke, cancer, and alzheimer- type vascular dementia. It is very important that you work harder with diet by avoiding all foods that are white. Avoid white rice (brown & wild rice is OK), white potatoes (sweetpotatoes in moderation is OK), White bread or wheat bread or anything made out of white flour like bagels, donuts, rolls, buns, biscuits, cakes, pastries, cookies, pizza crust, and pasta (made from white flour & egg whites) - vegetarian pasta or spinach or wheat pasta is OK. Multigrain breads like Arnold's or Pepper[employer], or multigrain sandwich thins or flatbreads.  Diet, exercise and weight loss can reverse and cure diabetes in the early stages.  Diet, exercise and weight loss is very important in the control and prevention of complications of diabetes which affects every system in your body, ie. Brain - dementia/stroke, eyes - glaucoma/blindness, heart - heart attack/heart failure, kidneys - dialysis, stomach - gastric paralysis, intestines - malabsorption, nerves - severe painful neuritis, circulation - gangrene & loss of a leg(s), and finally cancer and Alzheimers.    I  recommend avoid fried & greasy foods,  sweets/candy, white rice (brown or wild rice or Quinoa is OK), white potatoes (sweet potatoes are OK) - anything made from white flour - bagels, doughnuts, rolls, buns, biscuits,white and wheat breads, pizza crust and traditional pasta made of white flour & egg white(vegetarian pasta or spinach or wheat pasta is OK).  Multi-grain bread is OK - like multi-grain flat bread or sandwich thins. Avoid alcohol in excess. Exercise is also important.    Eat all the vegetables you want - avoid meat, especially red meat and dairy - especially cheese.  Cheese is the most concentrated form of trans-fats which is the worst thing to clog up our arteries. Veggie cheese is OK which can be found in the fresh produce section at [employer] or [employer] or [employer]  ++++++++++++++++++++++ DASH Eating Plan  DASH stands for "Dietary Approaches to Stop Hypertension."   The DASH eating plan is a healthy eating plan that has been shown to reduce high blood pressure (hypertension). Additional health benefits may include reducing the risk of type 2 diabetes mellitus, heart disease, and stroke. The DASH eating plan may also help with weight loss. WHAT DO I NEED TO KNOW ABOUT THE DASH EATING PLAN? For the DASH eating plan, you will follow these general guidelines:  Choose foods with a percent daily value for sodium of less than 5% (as listed on the food label).  Use salt-free seasonings or herbs instead of table salt or sea salt.  Check with your health care provider or pharmacist before using salt substitutes.  Eat lower-sodium products, often labeled as "lower sodium" or "no salt added."  Eat fresh foods.  Eat more vegetables, fruits, and low-fat dairy products.  Choose whole grains. Look for the word "whole" as the first word in the ingredient  list.  Choose fish   Limit sweets, desserts, sugars, and sugary drinks.  Choose heart-healthy fats.  Eat veggie cheese    Eat more home-cooked food and less restaurant, buffet, and fast food.  Limit fried foods.  Bisping foods using methods other than frying.  Limit canned vegetables. If you do use them, rinse them well to decrease the sodium.  When eating at a restaurant, ask that your food be prepared with less salt, or no salt if possible.                      WHAT FOODS CAN I EAT? Read Dr Fara Olden Fuhrman's books on The End of Dieting & The End of Diabetes  Grains Whole grain or whole wheat bread. Brown rice. Whole grain or whole wheat pasta. Quinoa, bulgur, and whole grain cereals. Low-sodium cereals. Corn or whole wheat flour tortillas. Whole grain cornbread. Whole grain crackers. Low-sodium crackers.  Vegetables Fresh or frozen vegetables (raw, steamed, roasted, or grilled). Low-sodium or reduced-sodium tomato and vegetable juices. Low-sodium or reduced-sodium tomato sauce and paste. Low-sodium or reduced-sodium canned vegetables.   Fruits All fresh, canned (in natural juice), or frozen fruits.  Protein Products  All fish and seafood.  Dried beans, peas, or lentils. Unsalted nuts and seeds. Unsalted canned beans.  Dairy Low-fat dairy products, such as skim or 1% milk, 2% or reduced-fat cheeses, low-fat ricotta or cottage cheese, or plain low-fat yogurt. Low-sodium or reduced-sodium cheeses.  Fats and Oils Tub margarines without trans fats. Light or reduced-fat mayonnaise and salad dressings (reduced sodium). Avocado. Safflower, olive, or canola oils. Natural peanut or almond butter.  Other Unsalted popcorn and pretzels. The items listed above may not be a complete list of recommended foods or beverages. Contact your dietitian for more options.  ++++++++++++++++++  WHAT FOODS ARE NOT RECOMMENDED? Grains/ White flour or wheat flour White bread. White pasta. White rice. Refined cornbread. Bagels and croissants. Crackers that contain trans fat.  Vegetables  Creamed or fried vegetables.  Vegetables in a . Regular canned vegetables. Regular canned tomato sauce and paste. Regular tomato and vegetable juices.  Fruits Dried fruits. Canned fruit in light or heavy syrup. Fruit juice.  Meat and Other Protein Products Meat in general - RED meat & White meat.  Fatty cuts of meat. Ribs, chicken wings, all processed meats as bacon, sausage, bologna, salami, fatback, hot dogs, bratwurst and packaged luncheon meats.  Dairy Whole or 2% milk, cream, half-and-half, and cream cheese. Whole-fat or sweetened yogurt. Full-fat cheeses or blue cheese. Non-dairy creamers and whipped toppings. Processed cheese, cheese spreads, or cheese curds.  Condiments Onion and garlic salt, seasoned salt, table salt, and sea salt. Canned and packaged gravies. Worcestershire sauce. Tartar sauce. Barbecue sauce. Teriyaki sauce. Soy sauce, including reduced sodium. Steak sauce. Fish sauce. Oyster sauce. Cocktail sauce. Horseradish. Ketchup and mustard. Meat flavorings and tenderizers. Bouillon cubes. Hot sauce. Tabasco sauce. Marinades. Taco seasonings. Relishes.  Fats and Oils Butter, stick margarine, lard, shortening and bacon fat. Coconut, palm kernel, or palm oils. Regular salad dressings.  Pickles and olives. Salted popcorn and pretzels.  The items listed above may not be a complete list of foods and beverages to avoid.

## 2018-08-12 NOTE — Progress Notes (Addendum)
Assessment and Plan:  Sherry Dennis was seen today for evaluation s/p episode of of dizziness, visual -spatial disorientation of unknown etiology.  Dehydration, TIA, hypotension possible causes related to story will check labs as noted below. PE Wells score 0, PERC rule 2, will check D-dimer.   Discussed importance of evaluation when symptoms are occurring. With history of HTN, HLD and aortic atherosclerosis recommendation daily ASA 81mg .  Diagnoses and all orders for this visit:  Visual-spatial disorientation Continue to monitor Discussed hospital precautions Discussed Eye Exam -     CBC with Differential/Platelet -     COMPLETE METABOLIC PANEL WITH GFR -     Magnesium -     Hemoglobin A1c -     Urinalysis w microscopic + reflex cultur  Dizziness Continue to monitor -     CBC with Differential/Platelet -     COMPLETE METABOLIC PANEL WITH GFR -     Magnesium -     TSH -     Hemoglobin A1c -     Iron,Total/Total Iron Binding Cap -     VITAMIN D 25 Hydroxy (Vit-D Deficiency) -     D-dimer, quantitative (not at [facility]) Increase water intake  Hypotension due to hypovolemia Increase water intake, especially when working -     CBC with Differential/Platelet -     COMPLETE METABOLIC PANEL WITH GFR  Hypothyroidism, unspecified type Taking Levothyroxine 133mcg daily -     TSH  Class 2 severe obesity due to excess calories with serious comorbidity and body mass index (BMI) of 37.0 to 37.9 in adult [facility]) Discussed dietary and exercise modifications to assist with medication regiment. -     phentermine (ADIPEX-P) 37.5 MG tablet; Take 1/2 to 1 tablet every morning for dieting & weightloss -     Hemoglobin A1c  Medication management -     CBC with Differential/Platelet -     COMPLETE METABOLIC PANEL WITH GFR -     Magnesium -     Lipid panel -     TSH -     Hemoglobin A1c -     Iron,Total/Total Iron Binding Cap -     VITAMIN D 25 Hydroxy (Vit-D Deficiency) -     Microalbumin / creatinine  urine ratio -     Urinalysis w microscopic + reflex cultur -     Vitamin B12  Call or return with new or worsening symptoms as discussed in appointment.  May contact via office phone [phone] or [place].   Further disposition pending results of labs. Discussed med's effects and SE's.   Over 30 minutes of exam, counseling, chart review, and critical decision making was performed.   Future Appointments  Date Time Provider [place]  08/25/2018  3:00 PM Vicie Mutters, PA-C [name] None    ------------------------------------------------------------------------------------------------------------------   HPI 57 y.o.female presents for follow up from episode of dizziness, visual disturbance and just not feeling well four days ago.  Reports that she was at work and suddenly became very sweaty and felt dizzy.  She had just had lunch.  Her coworkers noted she was pale and EMS was called.  They completed an EKG which was normal.  Her blood sugar was 78 and hypotensive.  Prior to them leaving blood sugar recheck was 115.  She reports some dizziness when changing positions quickly at baseline but this was peristant.  She reports blurry vision during this time but resolved once she laid down in the EMS truck.   She reports she  checks her blood pressure couple times a week.  She reports that it is to goal at home in the 130's-140s over 80's.  She reports she has not had an episode like this in the past.  There have been no further symptoms since.  Past Medical History:  Diagnosis Date  . Allergy   . Arthritis    "knees, hands, ankles, ~ every joint" (04/08/2015)  . Basal cell carcinoma    "face, hands, chest" (04/08/2015)  . Chronic bronchitis ([place])    "get it pretty much q yr" (04/08/2015)  . Dysrhythmia    palpitations occ; SVT 09/2013 s/p adenosine  . GERD (gastroesophageal reflux disease)   . H/O hiatal hernia   . Hx of cardiovascular stress test    Lexiscan Myoview 6/16:  Ejection fraction is 60% and wall motion is normal. The study is normal. There is no scar or ischemia. This is a low risk scan.  . Hyperlipidemia   . Hypertension   . Hypothyroidism   . Palpitations   . Pneumonia 2015 X 1  . PONV (postoperative nausea and vomiting)   . S/P total knee arthroplasty 02/15/2014  . Thyroid disease   . Vitamin D deficiency      Allergies  Allergen Reactions  . Darvocet [Propoxyphene N-Acetaminophen] Anaphylaxis  . Darvon [Propoxyphene Hcl] Anaphylaxis and Other (See Comments)    Airway swelling   . Propoxyphene Anaphylaxis    Throat closes up  . Doxycycline Nausea Only  . Tamiflu [Oseltamivir Phosphate] Hives and Other (See Comments)    Increased blood pressure    Current Outpatient Medications on File Prior to Visit  Medication Sig  . acetaminophen (TYLENOL) 650 MG CR tablet Take 1,300 mg by mouth 2 (two) times daily as needed for pain.  . Adalimumab (HUMIRA) 40 MG/0.8ML PSKT Inject 40 mg into the skin every 14 (fourteen) days.   . bisoprolol-hydrochlorothiazide (ZIAC) 5-6.25 MG tablet TAKE 1/2-1 TABLET BY MOUTH DAILY FOR BLOOD PRESSURE  . Cholecalciferol (VITAMIN D3) 10000 units capsule Take 10,000 Units by mouth daily.  . diphenhydrAMINE (BENADRYL) 25 MG tablet Take 25 mg by mouth daily.   Marland Kitchen escitalopram (LEXAPRO) 20 MG tablet Take 1 tablet (20 mg total) by mouth daily.  Marland Kitchen leflunomide (ARAVA) 20 MG tablet Take 20 mg by mouth daily.  Marland Kitchen levothyroxine (SYNTHROID) 125 MCG tablet PLEASE SEE ATTACHED FOR DETAILED DIRECTIONS PER DOCTOR  . NON FORMULARY Apply 1 application topically at bedtime. Dermatologist Office Cream for Rosacea  . olmesartan (BENICAR) 20 MG tablet TAKE 2 TABLETS (40MG ) BY MOUTH DAILY  . ranitidine (ZANTAC) 75 MG tablet Take 75 mg by mouth daily as needed for heartburn.  . VOLTAREN 1 % GEL Apply 2 g topically 4 (four) times daily as needed (joint pain).   No current facility-administered medications on file prior to visit.     ROS:  Review of Systems  Constitutional: Negative for chills, diaphoresis, fever, malaise/fatigue and weight loss.  HENT: Positive for ear pain. Negative for congestion, ear discharge, hearing loss, nosebleeds, sinus pain, sore throat and tinnitus.   Eyes: Positive for blurred vision. Negative for double vision, photophobia, pain, discharge and redness.       Blurred during episode, has resolved.  Respiratory: Negative for cough, hemoptysis, sputum production, shortness of breath, wheezing and stridor.   Cardiovascular: Negative for chest pain, palpitations, orthopnea, claudication and leg swelling.  Gastrointestinal: Negative for abdominal pain, blood in stool, constipation, diarrhea, heartburn, melena, nausea and vomiting.  Genitourinary: Negative for dysuria,  flank pain, frequency, hematuria and urgency.  Musculoskeletal: Positive for neck pain. Negative for back pain, joint pain and myalgias.  Neurological: Positive for headaches. Negative for tingling, tremors, sensory change, speech change, focal weakness, seizures, loss of consciousness and weakness.  Endo/Heme/Allergies: Negative for environmental allergies and polydipsia. Does not bruise/bleed easily.  Psychiatric/Behavioral: Positive for suicidal ideas. Negative for depression, hallucinations, memory loss and substance abuse. The patient is nervous/anxious. The patient does not have insomnia.      Physical Exam:  BP (!) 142/88   Pulse 64   Temp 98.1 F (36.7 C)   Ht 5' 5.5" (1.664 m)   Wt 240 lb 9.6 oz (109.1 kg)   SpO2 97%   BMI 39.43 kg/m   General Appearance: Well nourished, in no apparent distress. Eyes: PERRLA, EOMs, conjunctiva no swelling or erythema Sinuses: No Frontal/maxillary tenderness ENT/Mouth: Ext aud canals clear, TMs without erythema, bulging. No erythema, swelling, or exudate on post pharynx.  Tonsils not swollen or erythematous. Hearing normal.  Neck: Supple, thyroid normal.  Respiratory: Respiratory effort  normal, BS equal bilaterally without rales, rhonchi, wheezing or stridor.  Cardio: RRR with no MRGs. Brisk peripheral pulses without edema.  Abdomen: Soft, + BS.  Non tender, no guarding, rebound, hernias, masses. Lymphatics: Non tender without lymphadenopathy.  Musculoskeletal: Full ROM, 5/5 strength, normal gait.  Skin: Warm, dry without rashes, lesions, ecchymosis.  Neuro: Cranial nerves intact. Normal muscle tone, no cerebellar symptoms. Sensation intact.  Psych: Awake and oriented X 3, normal affect, Insight and Judgment appropriate.     Garnet Sierras, NP 11:31 PM [facility]

## 2018-08-13 ENCOUNTER — Encounter: Payer: Self-pay | Admitting: Adult Health Nurse Practitioner

## 2018-08-13 MED ORDER — LEVOTHYROXINE SODIUM 137 MCG PO TABS: ORAL_TABLET | ORAL | 1 refills | Status: DC

## 2018-08-14 ENCOUNTER — Ambulatory Visit (HOSPITAL_COMMUNITY)
Admission: RE | Admit: 2018-08-14 | Discharge: 2018-08-14 | Disposition: A | Discharge status: Home / Self Care | Payer: 59 | Source: Ambulatory Visit | Attending: Adult Health Nurse Practitioner | Admitting: Adult Health Nurse Practitioner

## 2018-08-14 ENCOUNTER — Encounter (HOSPITAL_COMMUNITY): Payer: Self-pay

## 2018-08-14 ENCOUNTER — Encounter: Payer: Self-pay | Admitting: Adult Health

## 2018-08-14 ENCOUNTER — Other Ambulatory Visit: Payer: Self-pay | Admitting: Adult Health Nurse Practitioner

## 2018-08-14 DIAGNOSIS — R7989 Other specified abnormal findings of blood chemistry: Secondary | ICD-10-CM | POA: Diagnosis present

## 2018-08-14 MED ORDER — IOPAMIDOL (ISOVUE-370) INJECTION 76%
100.0000 mL | Freq: Once | INTRAVENOUS | Status: AC | PRN
  Administered 2018-08-14: 100 mL via INTRAVENOUS

## 2018-08-14 MED ORDER — IOPAMIDOL (ISOVUE-370) INJECTION 76%
INTRAVENOUS | Status: AC
  Filled 2018-08-14: qty 100

## 2018-08-15 ENCOUNTER — Other Ambulatory Visit: Payer: Self-pay | Admitting: Adult Health Nurse Practitioner

## 2018-08-15 DIAGNOSIS — R8279 Other abnormal findings on microbiological examination of urine: Secondary | ICD-10-CM

## 2018-08-15 LAB — URINALYSIS W MICROSCOPIC + REFLEX CULTURE
Bacteria, UA: NONE SEEN /HPF
Bilirubin Urine: NEGATIVE
Glucose, UA: NEGATIVE
Hgb urine dipstick: NEGATIVE
Hyaline Cast: NONE SEEN /LPF
Ketones, ur: NEGATIVE
Nitrites, Initial: NEGATIVE
Protein, ur: NEGATIVE
RBC / HPF: NONE SEEN /HPF (ref 0–2)
Specific Gravity, Urine: 1.009 (ref 1.001–1.03)
pH: 6.5 (ref 5.0–8.0)

## 2018-08-15 LAB — VITAMIN D 25 HYDROXY (VIT D DEFICIENCY, FRACTURES): Vit D, 25-Hydroxy: 31 ng/mL (ref 30–100)

## 2018-08-15 LAB — CBC WITH DIFFERENTIAL/PLATELET
Absolute Monocytes: 670 cells/uL (ref 200–950)
Basophils Absolute: 39 {cells}/uL (ref 0–200)
Basophils Relative: 0.5 %
Eosinophils Absolute: 69 {cells}/uL (ref 15–500)
Eosinophils Relative: 0.9 %
HCT: 38.5 % (ref 35.0–45.0)
Hemoglobin: 13.1 g/dL (ref 11.7–15.5)
Lymphs Abs: 1979 cells/uL (ref 850–3900)
MCH: 32.9 pg (ref 27.0–33.0)
MCHC: 34 g/dL (ref 32.0–36.0)
MCV: 96.7 fL (ref 80.0–100.0)
MPV: 12 fL (ref 7.5–12.5)
Monocytes Relative: 8.7 %
Neutro Abs: 4943 cells/uL (ref 1500–7800)
Neutrophils Relative %: 64.2 %
Platelets: 204 Thousand/uL (ref 140–400)
RBC: 3.98 10*6/uL (ref 3.80–5.10)
RDW: 11.7 % (ref 11.0–15.0)
Total Lymphocyte: 25.7 %
WBC: 7.7 10*3/uL (ref 3.8–10.8)

## 2018-08-15 LAB — COMPLETE METABOLIC PANEL WITHOUT GFR
AST: 10 U/L (ref 10–35)
Albumin: 4.3 g/dL (ref 3.6–5.1)
Alkaline phosphatase (APISO): 77 U/L (ref 33–130)
BUN: 20 mg/dL (ref 7–25)
Chloride: 104 mmol/L (ref 98–110)
Glucose, Bld: 95 mg/dL (ref 65–99)
Sodium: 138 mmol/L (ref 135–146)
Total Bilirubin: 0.4 mg/dL (ref 0.2–1.2)

## 2018-08-15 LAB — URINE CULTURE
MICRO NUMBER:: 2345
SPECIMEN QUALITY:: ADEQUATE

## 2018-08-15 LAB — MICROALBUMIN / CREATININE URINE RATIO
Creatinine, Urine: 40 mg/dL (ref 20–275)
Microalb Creat Ratio: 13 mcg/mg creat (ref <30)
Microalb, Ur: 0.5 mg/dL

## 2018-08-15 LAB — D-DIMER, QUANTITATIVE: D-Dimer, Quant: 0.97 mcg/mL FEU — ABNORMAL HIGH (ref <0.50)

## 2018-08-15 LAB — HEMOGLOBIN A1C
Hgb A1c MFr Bld: 5.8 % of total Hgb — ABNORMAL HIGH (ref <5.7)
Mean Plasma Glucose: 120 (calc)
eAG (mmol/L): 6.6 (calc)

## 2018-08-15 LAB — COMPLETE METABOLIC PANEL WITH GFR
AG Ratio: 1.7 (calc) (ref 1.0–2.5)
ALT: 14 U/L (ref 6–29)
CO2: 28 mmol/L (ref 20–32)
Calcium: 9.3 mg/dL (ref 8.6–10.4)
Creat: 0.81 mg/dL (ref 0.50–1.05)
GFR, Est African American: 95 mL/min/{1.73_m2} (ref >=60)
GFR, Est Non African American: 82 mL/min/{1.73_m2} (ref >=60)
Globulin: 2.5 g/dL (calc) (ref 1.9–3.7)
Potassium: 4.1 mmol/L (ref 3.5–5.3)
Total Protein: 6.8 g/dL (ref 6.1–8.1)

## 2018-08-15 LAB — TSH: TSH: 9.14 mIU/L — ABNORMAL HIGH

## 2018-08-15 LAB — IRON, TOTAL/TOTAL IRON BINDING CAP
%SAT: 25 % (calc) (ref 16–45)
Iron: 97 ug/dL (ref 45–160)
TIBC: 386 mcg/dL (calc) (ref 250–450)

## 2018-08-15 LAB — LIPID PANEL
Cholesterol: 194 mg/dL (ref <200)
HDL: 55 mg/dL (ref >50)
LDL Cholesterol (Calc): 111 mg/dL (calc) — ABNORMAL HIGH
Non-HDL Cholesterol (Calc): 139 mg/dL (calc) — ABNORMAL HIGH (ref <130)
Total CHOL/HDL Ratio: 3.5 (calc) (ref <5.0)
Triglycerides: 160 mg/dL — ABNORMAL HIGH (ref <150)

## 2018-08-15 LAB — CULTURE INDICATED

## 2018-08-15 LAB — MAGNESIUM: Magnesium: 2.3 mg/dL (ref 1.5–2.5)

## 2018-08-15 LAB — VITAMIN B12: Vitamin B-12: 208 pg/mL (ref 200–1100)

## 2018-08-15 MED ORDER — SULFAMETHOXAZOLE-TRIMETHOPRIM 800-160 MG PO TABS: 1.0000 | ORAL_TABLET | Freq: Two times a day (BID) | ORAL | 0 refills | Status: DC

## 2018-08-20 ENCOUNTER — Other Ambulatory Visit: Payer: Self-pay | Admitting: Internal Medicine

## 2018-08-22 NOTE — Progress Notes (Signed)
Assessment and Plan:  Essential hypertension - continue medications, DASH diet, exercise and monitor at home. Call if greater than 130/80.  -     CBC with Differential/Platelet -     BASIC METABOLIC PANEL WITH GFR -     Hepatic function panel  PSVT (paroxysmal supraventricular tachycardia) (HCC) Controlled, monitor, get sleep study  Thyroid disease Continue to follow up Dr. Darnell Level  Mixed hyperlipidemia -continue medications, check lipids, decrease fatty foods, increase activity.  -     Lipid panel  Tobacco use disorder Smoking cessation-  instruction/counseling given, counseled patient on the dangers of tobacco use, advised patient to stop smoking, and reviewed strategies to maximize success, patient not ready to quit at this time.   Vitamin D deficiency  Class 3 severe obesity due to excess calories with serious comorbidity and body mass index (BMI) of 40.0 to 44.9 in adult [facility]) - long discussion about weight loss, diet, and exercise -sleep study scheduled for in the 27th  Prediabetes -     Hemoglobin A1c  Medication management -     Magnesium  Depression Increase lexapro, doing well  Continue diet and meds as discussed. Further disposition pending results of labs. Future Appointments  Date Time Provider [place]  09/08/2018  9:00 AM [facility] None  09/01/2019  3:00 PM Vicie Mutters, PA-C [name] None    HPI 58 y.o. female  presents for 3 month follow up with hypertension, hyperlipidemia, prediabetes and vitamin D and CPE  Her blood pressure has been controlled at home, her BP is doing better with ziac, today their BP is BP: 122/78   Patient has history of ablation for SVT with Dr. Lajuana Ripple in 2016.  She has a history of RA and was on humira being switched to [place] 11mg  once daily, still on the arava as well following with Dr. Amil Amen.   She was started on the lexapro last visit 10mg  and she is doing better.  She does not workout.   She denies chest pain, shortness of breath, dizziness.  She is not on cholesterol medication and denies myalgias. Her cholesterol is at goal. The cholesterol last visit was:   Lab Results  Component Value Date   CHOL 194 08/12/2018   HDL 55 08/12/2018   LDLCALC 111 (H) 08/12/2018   TRIG 160 (H) 08/12/2018   CHOLHDL 3.5 08/12/2018   She has been working on diet and exercise for prediabetes, and denies paresthesia of the feet, polydipsia and polyuria. Last A1C in the office was:  Lab Results  Component Value Date   HGBA1C 5.8 (H) 08/12/2018   Patient is on Vitamin D supplement, she is on 10,000.   Lab Results  Component Value Date   VD25OH 31 08/12/2018     She is on thyroid medication. Her medication was changed, 158mcg to the 154mcg daily with water 1 hour before food, has only been on it a few days.   Lab Results  Component Value Date   TSH 9.14 (H) 08/12/2018  .  BMI is Body mass index is 40.8 kg/m. She has sleep study scheduled for in Jan 27th. She could not sleep with the phentermine, got mean on the wellbutrin. She struggles with night time eating and mindless eating. Will start to get in heated pool at [facility] center.  Wt Readings from Last 3 Encounters:  08/25/18 245 lb 3.2 oz (111.2 kg)  08/12/18 240 lb 9.6 oz (109.1 kg)  08/04/18 243 lb (110.2 kg)  She has a B12 def, needs shots Lab Results  Component Value Date   VITAMINB12 208 08/12/2018    Current Medications:  Current Outpatient Medications on File Prior to Visit  Medication Sig Dispense Refill  . acetaminophen (TYLENOL) 650 MG CR tablet Take 1,300 mg by mouth 2 (two) times daily as needed for pain.    . bisoprolol-hydrochlorothiazide (ZIAC) 5-6.25 MG tablet TAKE 1/2-1 TABLET BY MOUTH DAILY FOR BLOOD PRESSURE 30 tablet 0  . Cholecalciferol (VITAMIN D3) 10000 units capsule Take 10,000 Units by mouth daily.    . diphenhydrAMINE (BENADRYL) 25 MG tablet Take 25 mg by mouth daily.     Marland Kitchen escitalopram (LEXAPRO) 20  MG tablet Take 1 tablet (20 mg total) by mouth daily. 90 tablet 1  . leflunomide (ARAVA) 20 MG tablet Take 20 mg by mouth daily.    Marland Kitchen levothyroxine (SYNTHROID, LEVOTHROID) 137 MCG tablet Take one tablet by mouth every morning on empty stomach with water, 19min prior to eating.  Wait 4 hours for antacids. 30 tablet 1  . NON FORMULARY Apply 1 application topically at bedtime. Dermatologist Office Cream for Rosacea    . olmesartan (BENICAR) 40 MG tablet Take 1 tablet daily for BP 90 tablet 1  . ranitidine (ZANTAC) 75 MG tablet Take 75 mg by mouth daily as needed for heartburn.    . Tofacitinib Citrate ER 11 MG TB24 Take 11 mg by mouth daily.    . VOLTAREN 1 % GEL Apply 2 g topically 4 (four) times daily as needed (joint pain). 100 g 2   No current facility-administered medications on file prior to visit.    Medical History:  Past Medical History:  Diagnosis Date  . Allergy   . Arthritis    "knees, hands, ankles, ~ every joint" (04/08/2015)  . Basal cell carcinoma    "face, hands, chest" (04/08/2015)  . Chronic bronchitis ([place])    "get it pretty much q yr" (04/08/2015)  . Dysrhythmia    palpitations occ; SVT 09/2013 s/p adenosine  . GERD (gastroesophageal reflux disease)   . H/O hiatal hernia   . Hx of cardiovascular stress test    Lexiscan Myoview 6/16: Ejection fraction is 60% and wall motion is normal. The study is normal. There is no scar or ischemia. This is a low risk scan.  . Hyperlipidemia   . Hypertension   . Hypothyroidism   . Palpitations   . Pneumonia 2015 X 1  . PONV (postoperative nausea and vomiting)   . S/P total knee arthroplasty 02/15/2014  . Thyroid disease   . Vitamin D deficiency    Immunization History  Administered Date(s) Administered  . Influenza-Unspecified 05/14/2015, 05/27/2017  . PPD Test 12/02/2013  . Pneumococcal Polysaccharide-23 04/08/2012  . Td 10/16/2004  . Tdap 07/24/2017   Colonoscopy 11/2017 MGM 02/2017 Echo 2015 Stress test 2016 CT adrenal  2019 CTA chest 2020 PAP 2010 DUE follow up GYN  No LMP recorded. Patient has had an ablation.   Allergies Allergies  Allergen Reactions  . Darvocet [Propoxyphene N-Acetaminophen] Anaphylaxis  . Darvon [Propoxyphene Hcl] Anaphylaxis and Other (See Comments)    Airway swelling   . Propoxyphene Anaphylaxis    Throat closes up  . Doxycycline Nausea Only  . Tamiflu [Oseltamivir Phosphate] Hives and Other (See Comments)    Increased blood pressure    SURGICAL HISTORY She  has a past surgical history that includes Carpal tunnel release (Right, 1990); Tubal ligation (1987); Knee arthroscopy (Right, 2013); Bladder repair (1993); Endometrial  ablation (2009); Excision basal cell carcinoma (2014); Partial knee arthroplasty (Right, 02/15/2014); Cardiac catheterization (N/A, 04/08/2015); Joint replacement; Incontinence surgery (2010); Knee arthroscopy; Cardiac surgery; Colonoscopy; Polypectomy; and Colonoscopy with propofol (N/A, 11/25/2017). FAMILY HISTORY Her family history includes Atrial fibrillation in her mother; Breast cancer in her paternal aunt; COPD in her father; Diabetes in her father; Emphysema in her father; Heart attack in her maternal grandmother; Heart disease in her maternal grandfather, paternal grandfather, and paternal grandmother; Hyperlipidemia in her maternal grandfather, paternal grandfather, and paternal grandmother; Hypertension in her mother; Stroke in her maternal grandmother. SOCIAL HISTORY She  reports that she has been smoking cigarettes. She has a 60.00 pack-year smoking history. She has never used smokeless tobacco. She reports that she does not drink alcohol or use drugs.   Review of Systems  Constitutional: Positive for malaise/fatigue. Negative for chills, diaphoresis, fever and weight loss.  HENT: Negative.   Eyes: Negative.   Respiratory: Negative.   Cardiovascular: Negative.   Gastrointestinal: Negative.   Genitourinary: Negative.   Musculoskeletal: Positive  for neck pain. Negative for back pain, falls, joint pain and myalgias.  Skin: Negative.   Neurological: Positive for tingling and sensory change (bilateral arms from neck). Negative for dizziness, tremors, speech change, focal weakness, seizures, loss of consciousness, weakness and headaches.  Psychiatric/Behavioral: Positive for depression. Negative for hallucinations, memory loss, substance abuse and suicidal ideas. The patient has insomnia. The patient is not nervous/anxious.     Physical Exam: BP 122/78   Pulse 74   Temp (!) 97.5 F (36.4 C)   Ht 5\' 5"  (1.651 m)   Wt 245 lb 3.2 oz (111.2 kg)   SpO2 96%   BMI 40.80 kg/m  Wt Readings from Last 3 Encounters:  08/25/18 245 lb 3.2 oz (111.2 kg)  08/12/18 240 lb 9.6 oz (109.1 kg)  08/04/18 243 lb (110.2 kg)   General Appearance: Well nourished, in no apparent distress. Eyes: PERRLA, EOMs, conjunctiva no swelling or erythema Sinuses: No Frontal/maxillary tenderness ENT/Mouth: Ext aud canals clear, TMs without erythema, bulging. No erythema, swelling, or exudate on post pharynx.  Tonsils not swollen or erythematous. Hearing normal.  Neck: Supple, thyroid normal.  Respiratory: Respiratory effort normal, BS equal bilaterally without rales, rhonchi, wheezing or stridor.  Cardio: RRR with no MRGs. Brisk peripheral pulses without edema.  Abdomen: Soft, + BS, obese Non tender, no guarding, rebound, hernias, masses. Lymphatics: Non tender without lymphadenopathy.  Musculoskeletal: Full ROM, 5/5 strength, antalgic gait Skin: Warm, dry without rashes, lesions, ecchymosis.  Neuro: Cranial nerves intact. Normal muscle tone, no cerebellar symptoms. Sensation intact.  Psych: Awake and oriented X 3, normal affect, Insight and Judgment appropriate.    Vicie Mutters, PA-C 3:27 PM [facility]

## 2018-08-23 ENCOUNTER — Other Ambulatory Visit: Payer: Self-pay | Admitting: Physician Assistant

## 2018-08-23 ENCOUNTER — Other Ambulatory Visit: Payer: Self-pay | Admitting: Internal Medicine

## 2018-08-25 ENCOUNTER — Ambulatory Visit: Payer: 59 | Admitting: Physician Assistant

## 2018-08-25 ENCOUNTER — Encounter: Payer: Self-pay | Admitting: Physician Assistant

## 2018-08-25 VITALS — BP 122/78 | HR 74 | Temp 97.5°F | Ht 65.0 in | Wt 245.2 lb

## 2018-08-25 DIAGNOSIS — R7309 Other abnormal glucose: Secondary | ICD-10-CM

## 2018-08-25 DIAGNOSIS — F17200 Nicotine dependence, unspecified, uncomplicated: Secondary | ICD-10-CM

## 2018-08-25 DIAGNOSIS — D3502 Benign neoplasm of left adrenal gland: Secondary | ICD-10-CM

## 2018-08-25 DIAGNOSIS — Z1159 Encounter for screening for other viral diseases: Secondary | ICD-10-CM | POA: Diagnosis not present

## 2018-08-25 DIAGNOSIS — M069 Rheumatoid arthritis, unspecified: Secondary | ICD-10-CM

## 2018-08-25 DIAGNOSIS — E079 Disorder of thyroid, unspecified: Secondary | ICD-10-CM

## 2018-08-25 DIAGNOSIS — E559 Vitamin D deficiency, unspecified: Secondary | ICD-10-CM

## 2018-08-25 DIAGNOSIS — R8290 Unspecified abnormal findings in urine: Secondary | ICD-10-CM

## 2018-08-25 DIAGNOSIS — Z0000 Encounter for general adult medical examination without abnormal findings: Secondary | ICD-10-CM | POA: Diagnosis not present

## 2018-08-25 DIAGNOSIS — E782 Mixed hyperlipidemia: Secondary | ICD-10-CM

## 2018-08-25 DIAGNOSIS — I471 Supraventricular tachycardia, unspecified: Secondary | ICD-10-CM

## 2018-08-25 DIAGNOSIS — K635 Polyp of colon: Secondary | ICD-10-CM

## 2018-08-25 DIAGNOSIS — K644 Residual hemorrhoidal skin tags: Secondary | ICD-10-CM

## 2018-08-25 DIAGNOSIS — I10 Essential (primary) hypertension: Secondary | ICD-10-CM | POA: Diagnosis not present

## 2018-08-25 DIAGNOSIS — I700 Atherosclerosis of aorta: Secondary | ICD-10-CM

## 2018-08-25 DIAGNOSIS — J449 Chronic obstructive pulmonary disease, unspecified: Secondary | ICD-10-CM

## 2018-08-25 DIAGNOSIS — E538 Deficiency of other specified B group vitamins: Secondary | ICD-10-CM

## 2018-08-25 DIAGNOSIS — D120 Benign neoplasm of cecum: Secondary | ICD-10-CM

## 2018-08-25 DIAGNOSIS — Z6841 Body Mass Index (BMI) 40.0 and over, adult: Secondary | ICD-10-CM

## 2018-08-25 DIAGNOSIS — K6289 Other specified diseases of anus and rectum: Secondary | ICD-10-CM

## 2018-08-25 MED ORDER — TOPIRAMATE 50 MG PO TABS: 50.0000 mg | ORAL_TABLET | Freq: Every day | ORAL | 2 refills | Status: DC

## 2018-08-25 MED ORDER — CYANOCOBALAMIN 1000 MCG/ML IJ SOLN: 1000.0000 ug | INTRAMUSCULAR | 2 refills | Status: DC

## 2018-08-25 NOTE — Patient Instructions (Addendum)
HOW TO SCHEDULE A MAMMOGRAM  [facility]  7 a.m.-6:30 p.m., Monday 7 a.m.-5 p.m., Tuesday-Friday Schedule an appointment by calling [phone].  Get your PAP smear   TOPAMAX Going to start you on topamax, start on 1/2 pill for 3-5 nights, can increase to a whole pill for 1-2 weeks.  This medication is good for weight loss, headaches, pain This medication can cause numbness, tingling and can cause brain fog- stop if you get these Let me know how you are doing with this.  If this does not help we may try vyvanse for binge eating, let me know  Topiramate tablets What is this medicine? TOPIRAMATE (toe PYRE a mate) is used to treat seizures in adults or children with epilepsy. It is also used for the prevention of migraine headaches. This medicine may be used for other purposes; ask your health care provider or pharmacist if you have questions. COMMON BRAND NAME(S): Topamax, Topiragen What should I tell my health care provider before I take this medicine? They need to know if you have any of these conditions: -bleeding disorders -cirrhosis of the liver or liver disease -diarrhea -glaucoma -kidney stones or kidney disease -low blood counts, like low white cell, platelet, or red cell counts -lung disease like asthma, obstructive pulmonary disease, emphysema -metabolic acidosis -on a ketogenic diet -schedule for surgery or a procedure -suicidal thoughts, plans, or attempt; a previous suicide attempt by you or a family member -an unusual or allergic reaction to topiramate, other medicines, foods, dyes, or preservatives -pregnant or trying to get pregnant -breast-feeding How should I use this medicine? Take this medicine by mouth with a glass of water. Follow the directions on the prescription label. Do not crush or chew. You may take this medicine with meals. Take your medicine at regular intervals. Do not take it more often than directed. Talk to your  pediatrician regarding the use of this medicine in children. Special care may be needed. While this drug may be prescribed for children as young as 67 years of age for selected conditions, precautions do apply. Overdosage: If you think you have taken too much of this medicine contact a poison control center or emergency room at once. NOTE: This medicine is only for you. Do not share this medicine with others. What if I miss a dose? If you miss a dose, take it as soon as you can. If your next dose is to be taken in less than 6 hours, then do not take the missed dose. Take the next dose at your regular time. Do not take double or extra doses. What may interact with this medicine? Do not take this medicine with any of the following medications: -probenecid This medicine may also interact with the following medications: -acetazolamide -alcohol -amitriptyline -aspirin and aspirin-like medicines -birth control pills -certain medicines for depression -certain medicines for seizures -certain medicines that treat or prevent blood clots like warfarin, enoxaparin, dalteparin, apixaban, dabigatran, and rivaroxaban -digoxin -hydrochlorothiazide -lithium -medicines for pain, sleep, or muscle relaxation -metformin -methazolamide -NSAIDS, medicines for pain and inflammation, like ibuprofen or naproxen -pioglitazone -risperidone This list may not describe all possible interactions. Give your health care provider a list of all the medicines, herbs, non-prescription drugs, or dietary supplements you use. Also tell them if you smoke, drink alcohol, or use illegal drugs. Some items may interact with your medicine. What should I watch for while using this medicine? Visit your doctor or health care professional for regular checks  on your progress. Do not stop taking this medicine suddenly. This increases the risk of seizures if you are using this medicine to control epilepsy. Wear a medical identification  bracelet or chain to say you have epilepsy or seizures, and carry a card that lists all your medicines. This medicine can decrease sweating and increase your body temperature. Watch for signs of deceased sweating or fever, especially in children. Avoid extreme heat, hot baths, and saunas. Be careful about exercising, especially in hot weather. Contact your health care provider right away if you notice a fever or decrease in sweating. You should drink plenty of fluids while taking this medicine. If you have had kidney stones in the past, this will help to reduce your chances of forming kidney stones. If you have stomach pain, with nausea or vomiting and yellowing of your eyes or skin, call your doctor immediately. You may get drowsy, dizzy, or have blurred vision. Do not drive, use machinery, or do anything that needs mental alertness until you know how this medicine affects you. To reduce dizziness, do not sit or stand up quickly, especially if you are an older patient. Alcohol can increase drowsiness and dizziness. Avoid alcoholic drinks. If you notice blurred vision, eye pain, or other eye problems, seek medical attention at once for an eye exam. The use of this medicine may increase the chance of suicidal thoughts or actions. Pay special attention to how you are responding while on this medicine. Any worsening of mood, or thoughts of suicide or dying should be reported to your health care professional right away. This medicine may increase the chance of developing metabolic acidosis. If left untreated, this can cause kidney stones, bone disease, or slowed growth in children. Symptoms include breathing fast, fatigue, loss of appetite, irregular heartbeat, or loss of consciousness. Call your doctor immediately if you experience any of these side effects. Also, tell your doctor about any surgery you plan on having while taking this medicine since this may increase your risk for metabolic acidosis. Birth  control pills may not work properly while you are taking this medicine. Talk to your doctor about using an extra method of birth control. Women who become pregnant while using this medicine may enroll in the [place] Pregnancy Registry by calling [phone]. This registry collects information about the safety of antiepileptic drug use during pregnancy. What side effects may I notice from receiving this medicine? Side effects that you should report to your doctor or health care professional as soon as possible: -allergic reactions like skin rash, itching or hives, swelling of the face, lips, or tongue -decreased sweating and/or rise in body temperature -depression -difficulty breathing, fast or irregular breathing patterns -difficulty speaking -difficulty walking or controlling muscle movements -hearing impairment -redness, blistering, peeling or loosening of the skin, including inside the mouth -tingling, pain or numbness in the hands or feet -unusual bleeding or bruising -unusually weak or tired -worsening of mood, thoughts or actions of suicide or dying Side effects that usually do not require medical attention (report to your doctor or health care professional if they continue or are bothersome): -altered taste -back pain, joint or muscle aches and pains -diarrhea, or constipation -headache -loss of appetite -nausea -stomach upset, indigestion -tremors This list may not describe all possible side effects. Call your doctor for medical advice about side effects. You may report side effects to FDA at 1-800-FDA-1088. Where should I keep my medicine? Keep out of the reach of children. Store at  room temperature between 15 and 30 degrees C (59 and 86 degrees F) in a tightly closed container. Protect from moisture. Throw away any unused medicine after the expiration date. NOTE: This sheet is a summary. It may not cover all possible information. If you have  questions about this medicine, talk to your doctor, pharmacist, or health care provider.  2018 Elsevier/Gold Standard (2013-08-03 23:17:57)   Google mindful eating and here are some tips and tricks below.   Rate your hunger before you eat on a scale of 1-10, try to eat closer to a 6 or higher. And if you are at below that, why are you eating? Slow down and listen to your body.          Recombinant Zoster (Shingles) Vaccine, RZV: What You Need to Know 1. Why get vaccinated? Shingles (also called herpes zoster, or just zoster) is a painful skin rash, often with blisters. Shingles is caused by the varicella zoster virus, the same virus that causes chickenpox. After you have chickenpox, the virus stays in your body and can cause shingles later in life. You can't catch shingles from another person. However, a person who has never had chickenpox (or chickenpox vaccine) could get chickenpox from someone with shingles. A shingles rash usually appears on one side of the face or body and heals within 2 to 4 weeks. Its main symptom is pain, which can be severe. Other symptoms can include fever, headache, chills, and upset stomach. Very rarely, a shingles infection can lead to pneumonia, hearing problems, blindness, brain inflammation (encephalitis), or death. For about 1 person in 5, severe pain can continue even long after the rash has cleared up. This long-lasting pain is called post-herpetic neuralgia (PHN). Shingles is far more common in people 83 years of age and older than in younger people, and the risk increases with age. It is also more common in people whose immune system is weakened because of a disease such as cancer, or by drugs such as steroids or chemotherapy. At least 1 million people a year in the [place] get shingles. 2. Shingles vaccine (recombinant) Recombinant shingles vaccine was approved by FDA in 2017 for the prevention of shingles. In clinical trials, it was more than 90%  effective in preventing shingles. It can also reduce the likelihood of PHN. Two doses, 2 to 6 months apart, are recommended for adults 55 and older. This vaccine is also recommended for people who have already gotten the live shingles vaccine (Zostavax). There is no live virus in this vaccine. 3. Some people should not get this vaccine Tell your vaccine provider if you:  Have any severe, life-threatening allergies. A person who has ever had a life-threatening allergic reaction after a dose of recombinant shingles vaccine, or has a severe allergy to any component of this vaccine, may be advised not to be vaccinated. Ask your health care provider if you want information about vaccine components.  Are pregnant or breastfeeding. There is not much information about use of recombinant shingles vaccine in pregnant or nursing women. Your healthcare provider might recommend delaying vaccination.  Are not feeling well. If you have a mild illness, such as a cold, you can probably get the vaccine today. If you are moderately or severely ill, you should probably wait until you recover. Your doctor can advise you. 4. Risks of a vaccine reaction With any medicine, including vaccines, there is a chance of reactions. After recombinant shingles vaccination, a person might experience:  Pain, redness,  soreness, or swelling at the site of the injection  Headache, muscle aches, fever, shivering, fatigue In clinical trials, most people got a sore arm with mild or moderate pain after vaccination, and some also had redness and swelling where they got the shot. Some people felt tired, had muscle pain, a headache, shivering, fever, stomach pain, or nausea. About 1 out of 6 people who got recombinant zoster vaccine experienced side effects that prevented them from doing regular activities. Symptoms went away on their own in about 2 to 3 days. Side effects were more common in younger people. You should still get the second  dose of recombinant zoster vaccine even if you had one of these reactions after the first dose. Other things that could happen after this vaccine:  People sometimes faint after medical procedures, including vaccination. Sitting or lying down for about 15 minutes can help prevent fainting and injuries caused by a fall. Tell your provider if you feel dizzy or have vision changes or ringing in the ears.  Some people get shoulder pain that can be more severe and longer-lasting than routine soreness that can follow injections. This happens very rarely.  Any medication can cause a severe allergic reaction. Such reactions to a vaccine are estimated at about 1 in a million doses, and would happen within a few minutes to a few hours after the vaccination. As with any medicine, there is a very remote chance of a vaccine causing a serious injury or death. The safety of vaccines is always being monitored. For more information, visit: http://www.aguilar.org/ 5. What if there is a serious problem? What should I look for?  Look for anything that concerns you, such as signs of a severe allergic reaction, very high fever, or unusual behavior. Signs of a severe allergic reaction can include hives, swelling of the face and throat, difficulty breathing, a fast heartbeat, dizziness, and weakness. These would usually start a few minutes to a few hours after the vaccination. What should I do?  If you think it is a severe allergic reaction or other emergency that can't wait, call 9-1-1 or get to the nearest hospital. Otherwise, call your health care provider. Afterward, the reaction should be reported to the Vaccine Adverse Event Reporting System (VAERS). Your doctor should file this report, or you can do it yourself through the VAERS website at www.vaers.SamedayNews.es, or by calling 205-374-1123. VAERS does not give medical advice. 6. How can I learn more?  Ask your health care provider. He or she can give you the  vaccine package insert or suggest other sources of information.  Call your local or state health department.  Contact the Centers for Disease Control and Prevention (CDC): ? Call 820-511-2406 (1-800-CDC-INFO) or ? Visit CDC's vaccines website at http://hunter.com/ CDC Vaccine Information Statement Recombinant Zoster Vaccine (09/24/2016) This information is not intended to replace advice given to you by your health care provider. Make sure you discuss any questions you have with your health care provider. Document Released: 10/09/2016 Document Revised: 03/05/2018 Document Reviewed: 03/05/2018 Elsevier Interactive Patient Education  2019 [employer].

## 2018-08-26 DIAGNOSIS — M069 Rheumatoid arthritis, unspecified: Secondary | ICD-10-CM | POA: Diagnosis not present

## 2018-08-27 LAB — COMPLETE METABOLIC PANEL WITH GFR
AG Ratio: 1.6 (calc) (ref 1.0–2.5)
ALT: 21 U/L (ref 6–29)
AST: 16 U/L (ref 10–35)
Albumin: 4.1 g/dL (ref 3.6–5.1)
Alkaline phosphatase (APISO): 85 U/L (ref 33–130)
BUN / CREAT RATIO: 19 (calc) (ref 6–22)
BUN: 21 mg/dL (ref 7–25)
CO2: 26 mmol/L (ref 20–32)
Calcium: 9.3 mg/dL (ref 8.6–10.4)
Chloride: 102 mmol/L (ref 98–110)
Creat: 1.13 mg/dL — ABNORMAL HIGH (ref 0.50–1.05)
GFR, Est African American: 63 mL/min/{1.73_m2} (ref >=60)
GFR, Est Non African American: 55 mL/min/{1.73_m2} — ABNORMAL LOW (ref >=60)
GLUCOSE: 92 mg/dL (ref 65–99)
Globulin: 2.6 g/dL (calc) (ref 1.9–3.7)
Potassium: 4.2 mmol/L (ref 3.5–5.3)
Sodium: 136 mmol/L (ref 135–146)
Total Bilirubin: 0.2 mg/dL (ref 0.2–1.2)
Total Protein: 6.7 g/dL (ref 6.1–8.1)

## 2018-08-27 LAB — CBC WITH DIFFERENTIAL/PLATELET
Absolute Monocytes: 806 cells/uL (ref 200–950)
BASOS ABS: 43 {cells}/uL (ref 0–200)
Basophils Relative: 0.6 %
EOS ABS: 72 {cells}/uL (ref 15–500)
Eosinophils Relative: 1 %
HCT: 36.7 % (ref 35.0–45.0)
Hemoglobin: 12.4 g/dL (ref 11.7–15.5)
Lymphs Abs: 2268 cells/uL (ref 850–3900)
MCH: 33 pg (ref 27.0–33.0)
MCHC: 33.8 g/dL (ref 32.0–36.0)
MCV: 97.6 fL (ref 80.0–100.0)
MPV: 11.7 fL (ref 7.5–12.5)
Monocytes Relative: 11.2 %
Neutro Abs: 4010 cells/uL (ref 1500–7800)
Neutrophils Relative %: 55.7 %
Platelets: 201 10*3/uL (ref 140–400)
RBC: 3.76 10*6/uL — ABNORMAL LOW (ref 3.80–5.10)
RDW: 11.7 % (ref 11.0–15.0)
Total Lymphocyte: 31.5 %
WBC: 7.2 10*3/uL (ref 3.8–10.8)

## 2018-08-27 LAB — URINE CULTURE
MICRO NUMBER:: 47988
SPECIMEN QUALITY:: ADEQUATE

## 2018-08-27 LAB — MICROALBUMIN / CREATININE URINE RATIO
Creatinine, Urine: 165 mg/dL (ref 20–275)
Microalb Creat Ratio: 19 mcg/mg creat (ref <30)
Microalb, Ur: 3.1 mg/dL

## 2018-08-27 LAB — URINALYSIS, ROUTINE W REFLEX MICROSCOPIC
Bilirubin Urine: NEGATIVE
Glucose, UA: NEGATIVE
HGB URINE DIPSTICK: NEGATIVE
Ketones, ur: NEGATIVE
Leukocytes, UA: NEGATIVE
Nitrite: NEGATIVE
Protein, ur: NEGATIVE
Specific Gravity, Urine: 1.025 (ref 1.001–1.03)
pH: 5.5 (ref 5.0–8.0)

## 2018-08-27 LAB — LIPID PANEL
CHOLESTEROL: 204 mg/dL — AB (ref <200)
HDL: 44 mg/dL — ABNORMAL LOW (ref >50)
LDL Cholesterol (Calc): 111 mg/dL (calc) — ABNORMAL HIGH
Non-HDL Cholesterol (Calc): 160 mg/dL (calc) — ABNORMAL HIGH (ref <130)
Total CHOL/HDL Ratio: 4.6 (calc) (ref <5.0)
Triglycerides: 343 mg/dL — ABNORMAL HIGH (ref <150)

## 2018-08-27 LAB — HIV ANTIBODY (ROUTINE TESTING W REFLEX): HIV 1&2 Ab, 4th Generation: NONREACTIVE

## 2018-08-27 LAB — HEMOGLOBIN A1C
EAG (MMOL/L): 6.8 (calc)
Hgb A1c MFr Bld: 5.9 % of total Hgb — ABNORMAL HIGH (ref <5.7)
MEAN PLASMA GLUCOSE: 123 (calc)

## 2018-08-27 LAB — HEPATITIS C ANTIBODY
Hepatitis C Ab: NONREACTIVE
SIGNAL TO CUT-OFF: 0.01 (ref <1.00)

## 2018-08-27 NOTE — Addendum Note (Signed)
Addended by: Vicie Mutters R on: 08/27/2018 08:40 AM   Modules accepted: Orders

## 2018-09-01 ENCOUNTER — Other Ambulatory Visit: Payer: Self-pay | Admitting: Physician Assistant

## 2018-09-01 MED ORDER — BISOPROLOL-HYDROCHLOROTHIAZIDE 5-6.25 MG PO TABS: 1.0000 | ORAL_TABLET | Freq: Every day | ORAL | 1 refills | Status: DC

## 2018-09-02 ENCOUNTER — Other Ambulatory Visit: Payer: Self-pay

## 2018-09-02 MED ORDER — BISOPROLOL-HYDROCHLOROTHIAZIDE 5-6.25 MG PO TABS: 1.0000 | ORAL_TABLET | Freq: Every day | ORAL | 1 refills | Status: DC

## 2018-09-08 ENCOUNTER — Ambulatory Visit (INDEPENDENT_AMBULATORY_CARE_PROVIDER_SITE_OTHER): Payer: 59 | Admitting: Neurology

## 2018-09-08 DIAGNOSIS — R002 Palpitations: Secondary | ICD-10-CM

## 2018-09-08 DIAGNOSIS — R351 Nocturia: Secondary | ICD-10-CM

## 2018-09-08 DIAGNOSIS — J441 Chronic obstructive pulmonary disease with (acute) exacerbation: Secondary | ICD-10-CM

## 2018-09-08 DIAGNOSIS — G4733 Obstructive sleep apnea (adult) (pediatric): Secondary | ICD-10-CM

## 2018-09-08 DIAGNOSIS — J439 Emphysema, unspecified: Secondary | ICD-10-CM

## 2018-09-08 DIAGNOSIS — J449 Chronic obstructive pulmonary disease, unspecified: Secondary | ICD-10-CM

## 2018-09-08 DIAGNOSIS — G4736 Sleep related hypoventilation in conditions classified elsewhere: Secondary | ICD-10-CM

## 2018-09-08 DIAGNOSIS — R0683 Snoring: Secondary | ICD-10-CM

## 2018-09-16 ENCOUNTER — Telehealth: Payer: Self-pay | Admitting: Neurology

## 2018-09-16 DIAGNOSIS — G4733 Obstructive sleep apnea (adult) (pediatric): Secondary | ICD-10-CM | POA: Insufficient documentation

## 2018-09-16 DIAGNOSIS — Z9989 Dependence on other enabling machines and devices: Secondary | ICD-10-CM | POA: Insufficient documentation

## 2018-09-16 DIAGNOSIS — G4736 Sleep related hypoventilation in conditions classified elsewhere: Secondary | ICD-10-CM | POA: Insufficient documentation

## 2018-09-16 DIAGNOSIS — J439 Emphysema, unspecified: Secondary | ICD-10-CM | POA: Insufficient documentation

## 2018-09-16 DIAGNOSIS — R351 Nocturia: Secondary | ICD-10-CM | POA: Insufficient documentation

## 2018-09-16 DIAGNOSIS — R0683 Snoring: Secondary | ICD-10-CM | POA: Insufficient documentation

## 2018-09-16 DIAGNOSIS — J449 Chronic obstructive pulmonary disease, unspecified: Secondary | ICD-10-CM

## 2018-09-16 DIAGNOSIS — R002 Palpitations: Secondary | ICD-10-CM | POA: Insufficient documentation

## 2018-09-16 NOTE — Procedures (Signed)
NAME:  Sherry Dennis. Struss                                                               DOB: March 09, 1961 MEDICAL RECORD no: [id number]                                          DOS:  09/08/2018 REFERRING PHYSICIAN: Unk Pinto, MD STUDY PERFORMED: Home Sleep Test on [facility] HISTORY: Sherry Dennis is a 58 y.o. patient of Dr. Melford Aase, send for an evaluation of COPD overlap with possible OSA in this active smoker. She has not quit smoking, and she is still in need of a sleep study. This is her second appointment with me- 08-04-2018 . [name] reports that she was just recently diagnosed with rheumatoid arthritis, has irritable bowel syndrome, she has prediabetes, vitamin D deficiency and a low thyroid-stimulating hormone she also is considered morbidly obese at a BMI of 40 and she has recently developed more fatigue and daytime excessive daytime sleepiness, more frequently waking up and has been told that she snores. In the morning she feels like she doesn't get enough sleep is not restored or refreshed. In addition she has had swelling in her feet and legs, shortness of breath, wheezing alongside with snoring.She also had a partial knee replacement in 2016 and a heart ablation in 2016 for Wolff-Parkinson-White with tachycardia and palpitations.  Epworth sleepiness score was endorsed at 15/24 points, fatigue severity score is endorsed at 45 points. BMI: 39.0 kg/m2.   STUDY RESULTS:  Total Recording Time:  8 h 31 mins; Estimated Valid Sleep Time 5 h 25 mins Total Apnea/Hypopnea Index (AHI): 21.9 /h; RDI: 23.6 /h; REM AHI: 46.5 /h Average Oxygen Saturation: 95 %; Lowest Oxygen Desaturation: 90 %  Total Time in Oxygen Saturation below 89 %: 0.0 minutes  Average Heart Rate:  61 bpm (max 89 bpm). RECOMMENDATION: moderate severe sleep apnea at AHI 21.9/h and in REM 46.5 /h. No hypoxemia - which is surprising. DID the patient use any Oxygen the night of the HST? Did she sit I a recliner or slept in bed?  Based on  these data, she would need CPAP auto 5-15 cm water, 3 cm EPR, heated humidity, mask of patient's choice.  I certify that I have reviewed the raw data recording prior to the issuance of this report in accordance with the standards of the American Academy of Sleep Medicine (AASM). Larey Seat, M.D.      Medical Director of [facility] at [facility], accredited by the AASM. Diplomat of the ABPN and ABSM.

## 2018-09-16 NOTE — Telephone Encounter (Signed)
I called pt. I advised pt that Dr. Brett Fairy reviewed their sleep study results and found that pt has sleep apnea. Dr. Brett Fairy recommends that pt starts a auto CPAP 5-15cm water pressure. I reviewed PAP compliance expectations with the pt. Pt is agreeable to starting a CPAP. I advised pt that an order will be sent to a DME, [facility], and [facility] will call the pt within about one week after they file with the pt's insurance. [facility] will show the pt how to use the machine, fit for masks, and troubleshoot the CPAP if needed. A follow up appt was made for insurance purposes with Janett Billow NP on March 31,2020 at 1:45 pm. Pt verbalized understanding to arrive 15 minutes early and bring their CPAP. A letter with all of this information in it will be mailed to the pt as a reminder. I verified with the pt that the address we have on file is correct. Pt verbalized understanding of results. Pt had no questions at this time but was encouraged to call back if questions arise. I have sent the order to [facility] and have received confirmation that they have received the order.

## 2018-09-16 NOTE — Telephone Encounter (Signed)
-----   Message from Larey Seat, MD sent at 09/16/2018  9:11 AM EST ----- IMPRESSION/ RECOMMENDATION: moderate severe sleep apnea at AHI 21.9/h and in  REM 46.5 /h. No hypoxemia - which is surprising. DID the patient  use any Oxygen the night of the HST? Did she sit I a recliner or  slept in bed? Based on these data, she would need CPAP auto 5-15  cm water, 3 cm EPR, heated humidity, mask of patient's choice.

## 2018-09-16 NOTE — Addendum Note (Signed)
Addended by: Larey Seat on: 09/16/2018 09:11 AM   Modules accepted: Orders

## 2018-09-22 ENCOUNTER — Encounter: Payer: Self-pay | Admitting: Physician Assistant

## 2018-09-22 ENCOUNTER — Ambulatory Visit: Payer: 59 | Admitting: Physician Assistant

## 2018-09-22 VITALS — BP 126/78 | HR 61 | Temp 97.7°F | Ht 65.0 in | Wt 251.4 lb

## 2018-09-22 DIAGNOSIS — N289 Disorder of kidney and ureter, unspecified: Secondary | ICD-10-CM

## 2018-09-22 DIAGNOSIS — F17200 Nicotine dependence, unspecified, uncomplicated: Secondary | ICD-10-CM | POA: Diagnosis not present

## 2018-09-22 DIAGNOSIS — Z6841 Body Mass Index (BMI) 40.0 and over, adult: Secondary | ICD-10-CM

## 2018-09-22 DIAGNOSIS — J439 Emphysema, unspecified: Secondary | ICD-10-CM | POA: Diagnosis not present

## 2018-09-22 DIAGNOSIS — G4736 Sleep related hypoventilation in conditions classified elsewhere: Secondary | ICD-10-CM

## 2018-09-22 NOTE — Patient Instructions (Addendum)
Here is a number for you to call if you are considering bariatric surgery as a treatment for your obesity. This is an effective treatment for weight loss however it too is not a simple fix. The surgery can have complications during and after, and on average patients gain back the weight 10-15 years after the surgery if they do not change their eating habits.   Please call [phone] to learn more and to register for a seminar.  Will send in referral  WATER IS IMPORTANT  Being dehydrated can hurt your kidneys, cause fatigue, headaches, muscle aches, joint pain, and dry skin/nails so please increase your fluids.   Drink 80-100 oz a day of water, measure it out! Eat 3 meals a day, have to do breakfast, eat protein- hard boiled eggs, protein bar like nature valley protein bar, greek yogurt like oikos triple zero, chobani 100, or light n fit greek  Can check out plantnanny app on your phone to help you keep track of your water          When it comes to diets, agreement about the perfect plan isn't easy to find, even among the experts. Experts at the [employer] developed an idea known as the Healthy Eating Plate. Just imagine a plate divided into logical, healthy portions.  The emphasis is on diet quality:  Load up on vegetables and fruits - one-half of your plate: Aim for color and variety, and remember that potatoes don't count.  Go for whole grains - one-quarter of your plate: Whole wheat, barley, wheat berries, quinoa, oats, brown rice, and foods made with them. If you want pasta, go with whole wheat pasta.  Protein power - one-quarter of your plate: Fish, chicken, beans, and nuts are all healthy, versatile protein sources. Limit red meat.  The diet, however, does go beyond the plate, offering a few other suggestions.  Use healthy plant oils, such as olive, canola, soy, corn, sunflower and peanut. Check the labels, and avoid partially hydrogenated oil, which have  unhealthy trans fats.  If you're thirsty, drink water. Coffee and tea are good in moderation, but skip sugary drinks and limit milk and dairy products to one or two daily servings.  The type of carbohydrate in the diet is more important than the amount. Some sources of carbohydrates, such as vegetables, fruits, whole grains, and beans-are healthier than others.  Finally, stay active.

## 2018-09-22 NOTE — Progress Notes (Signed)
Assessment and Plan: Tobacco use disorder Smoking cessation-  instruction/counseling given, counseled patient on the dangers of tobacco use, advised patient to stop smoking, and reviewed strategies to maximize success, patient not ready to quit at this time.   Nocturnal hypoxemia due to emphysema (HCC) Getting on CPAP  Class 3 severe obesity due to excess calories with serious comorbidity and body mass index (BMI) of 40.0 to 44.9 in adult [facility]) -     Amb Referral to Bariatric Surgery  Abnormal kidney function -     COMPLETE METABOLIC PANEL WITH GFR -     Urinalysis, Routine w reflex microscopic -     Urine Culture     HPI 58 y.o.female presents for 1 month follow up for kidney function and morbid obesity.   Started on B12 shots for B12 def, she is on shots and states she is feeling much better. Marland Kitchen  And was started on topamax last visit due to intolerance to phentermine for weight loss but states that it has not helped. She has started the pool, going 3 days a week and states it has not helped with weight but it has helped her joints.  She has tried several weight loss things such as low carb, watching her calories.  She is interested in weight loss surgery due to multiple failed attempted and multiple co morbidities such as OSA, HTN, chol, OA, GERD.   BMI is Body mass index is 41.84 kg/m., she is working on diet and exercise. She had a positive sleep study and will start on a CPAP, gets the machine Wednesday.  Wt Readings from Last 3 Encounters:  09/22/18 251 lb 6.4 oz (114 kg)  08/25/18 245 lb 3.2 oz (111.2 kg)  08/12/18 240 lb 9.6 oz (109.1 kg)     Lab Results  Component Value Date   CREATININE 1.13 (H) 08/25/2018   BUN 21 08/25/2018   NA 136 08/25/2018   K 4.2 08/25/2018   CL 102 08/25/2018   CO2 26 08/25/2018    Lab Results  Component Value Date   GFRNONAA 51 (L) 08/25/2018     Past Medical History:  Diagnosis Date  . Allergy   . Arthritis    "knees,  hands, ankles, ~ every joint" (04/08/2015)  . Basal cell carcinoma    "face, hands, chest" (04/08/2015)  . Chronic bronchitis ([place])    "get it pretty much q yr" (04/08/2015)  . Dysrhythmia    palpitations occ; SVT 09/2013 s/p adenosine  . GERD (gastroesophageal reflux disease)   . H/O hiatal hernia   . Hx of cardiovascular stress test    Lexiscan Myoview 6/16: Ejection fraction is 60% and wall motion is normal. The study is normal. There is no scar or ischemia. This is a low risk scan.  . Hyperlipidemia   . Hypertension   . Hypothyroidism   . Palpitations   . Pneumonia 2015 X 1  . PONV (postoperative nausea and vomiting)   . S/P total knee arthroplasty 02/15/2014  . Thyroid disease   . Vitamin D deficiency      Allergies  Allergen Reactions  . Darvocet [Propoxyphene N-Acetaminophen] Anaphylaxis  . Darvon [Propoxyphene Hcl] Anaphylaxis and Other (See Comments)    Airway swelling   . Propoxyphene Anaphylaxis    Throat closes up  . Doxycycline Nausea Only  . Tamiflu [Oseltamivir Phosphate] Hives and Other (See Comments)    Increased blood pressure    Current Outpatient Medications on File Prior to Visit  Medication Sig  . acetaminophen (TYLENOL) 650 MG CR tablet Take 1,300 mg by mouth 2 (two) times daily as needed for pain.  . bisoprolol-hydrochlorothiazide (ZIAC) 5-6.25 MG tablet Take 1 tablet by mouth daily.  . Cholecalciferol (VITAMIN D3) 10000 units capsule Take 10,000 Units by mouth daily.  . cyanocobalamin (,VITAMIN B-12,) 1000 MCG/ML injection Inject 1 mL (1,000 mcg total) into the skin every 30 (thirty) days.  . diphenhydrAMINE (BENADRYL) 25 MG tablet Take 25 mg by mouth daily.   Marland Kitchen escitalopram (LEXAPRO) 20 MG tablet Take 1 tablet (20 mg total) by mouth daily.  Marland Kitchen leflunomide (ARAVA) 20 MG tablet Take 20 mg by mouth daily.  Marland Kitchen levothyroxine (SYNTHROID, LEVOTHROID) 137 MCG tablet Take one tablet by mouth every morning on empty stomach with water, 44min prior to eating.  Wait 4  hours for antacids.  . NON FORMULARY Apply 1 application topically at bedtime. Dermatologist Office Cream for Rosacea  . olmesartan (BENICAR) 40 MG tablet Take 1 tablet daily for BP  . ranitidine (ZANTAC) 75 MG tablet Take 75 mg by mouth daily as needed for heartburn.  . Tofacitinib Citrate ER 11 MG TB24 Take 11 mg by mouth daily.  Marland Kitchen topiramate (TOPAMAX) 50 MG tablet Take 1 tablet (50 mg total) by mouth at bedtime.  . VOLTAREN 1 % GEL Apply 2 g topically 4 (four) times daily as needed (joint pain).   No current facility-administered medications on file prior to visit.     ROS: all negative except above.   Physical Exam: Filed Weights   09/22/18 1532  Weight: 251 lb 6.4 oz (114 kg)   BP 126/78   Pulse 61   Temp 97.7 F (36.5 C)   Ht 5\' 5"  (1.651 m)   Wt 251 lb 6.4 oz (114 kg)   SpO2 96%   BMI 41.84 kg/m  General Appearance: Well nourished, in no apparent distress. Eyes: PERRLA, EOMs, conjunctiva no swelling or erythema Sinuses: No Frontal/maxillary tenderness ENT/Mouth: Ext aud canals clear, TMs without erythema, bulging. No erythema, swelling, or exudate on post pharynx.  Tonsils not swollen or erythematous. Hearing normal.  Neck: Supple, thyroid normal.  Respiratory: Respiratory effort normal, BS equal bilaterally without rales, rhonchi, wheezing or stridor.  Cardio: RRR with no MRGs. Brisk peripheral pulses without edema.  Abdomen: Soft, + BS.  Non tender, no guarding, rebound, hernias, masses. Lymphatics: Non tender without lymphadenopathy.  Musculoskeletal: Full ROM, 5/5 strength, normal gait.  Skin: Warm, dry without rashes, lesions, ecchymosis.  Neuro: Cranial nerves intact. Normal muscle tone, no cerebellar symptoms. Sensation intact.  Psych: Awake and oriented X 3, normal affect, Insight and Judgment appropriate.     Vicie Mutters, PA-C 3:50 PM [facility]

## 2018-09-23 LAB — COMPLETE METABOLIC PANEL WITH GFR
AG Ratio: 1.4 (calc) (ref 1.0–2.5)
ALKALINE PHOSPHATASE (APISO): 93 U/L (ref 37–153)
ALT: 31 U/L — ABNORMAL HIGH (ref 6–29)
AST: 20 U/L (ref 10–35)
Albumin: 3.9 g/dL (ref 3.6–5.1)
BUN: 16 mg/dL (ref 7–25)
CO2: 26 mmol/L (ref 20–32)
CREATININE: 0.85 mg/dL (ref 0.50–1.05)
Calcium: 9.5 mg/dL (ref 8.6–10.4)
Chloride: 108 mmol/L (ref 98–110)
GFR, Est African American: 89 mL/min/{1.73_m2} (ref >=60)
GFR, Est Non African American: 77 mL/min/{1.73_m2} (ref >=60)
Globulin: 2.7 g/dL (calc) (ref 1.9–3.7)
Glucose, Bld: 97 mg/dL (ref 65–99)
Potassium: 4.6 mmol/L (ref 3.5–5.3)
SODIUM: 143 mmol/L (ref 135–146)
Total Bilirubin: 0.2 mg/dL (ref 0.2–1.2)
Total Protein: 6.6 g/dL (ref 6.1–8.1)

## 2018-09-23 LAB — URINALYSIS, ROUTINE W REFLEX MICROSCOPIC
Bilirubin Urine: NEGATIVE
Glucose, UA: NEGATIVE
Hgb urine dipstick: NEGATIVE
Ketones, ur: NEGATIVE
Leukocytes,Ua: NEGATIVE
Nitrite: NEGATIVE
Protein, ur: NEGATIVE
Specific Gravity, Urine: 1.023 (ref 1.001–1.03)
pH: <=5 (ref 5.0–8.0)

## 2018-09-23 LAB — URINE CULTURE
MICRO NUMBER:: 174719
SPECIMEN QUALITY:: ADEQUATE

## 2018-09-24 MED ORDER — AMOXICILLIN-POT CLAVULANATE 250-125 MG PO TABS: 1.0000 | ORAL_TABLET | Freq: Two times a day (BID) | ORAL | 0 refills | Status: DC

## 2018-09-24 NOTE — Addendum Note (Signed)
Addended by: Vicie Mutters R on: 09/24/2018 05:55 AM   Modules accepted: Orders

## 2018-10-13 ENCOUNTER — Encounter: Payer: Self-pay | Admitting: Physician Assistant

## 2018-10-13 NOTE — Progress Notes (Signed)
Lab Results  Component Value Date   HGBA1C 5.9 (H) 08/25/2018   Wt Readings from Last 3 Encounters:  09/22/18 251 lb 6.4 oz (114 kg)  08/25/18 245 lb 3.2 oz (111.2 kg)  08/12/18 240 lb 9.6 oz (109.1 kg)   Temp Readings from Last 3 Encounters:  09/22/18 97.7 F (36.5 C)  08/25/18 (!) 97.5 F (36.4 C)  08/12/18 98.1 F (36.7 C)   BP Readings from Last 3 Encounters:  09/22/18 126/78  08/25/18 122/78  08/12/18 (!) 142/88   Pulse Readings from Last 3 Encounters:  09/22/18 61  08/25/18 74  08/12/18 64

## 2018-10-28 ENCOUNTER — Other Ambulatory Visit: Payer: Self-pay | Admitting: Physician Assistant

## 2018-11-11 ENCOUNTER — Encounter: Payer: Self-pay | Admitting: Adult Health

## 2018-11-11 ENCOUNTER — Ambulatory Visit (INDEPENDENT_AMBULATORY_CARE_PROVIDER_SITE_OTHER): Payer: 59 | Admitting: Adult Health

## 2018-11-11 ENCOUNTER — Other Ambulatory Visit: Payer: Self-pay

## 2018-11-11 VITALS — BP 136/81 | HR 72 | Ht 65.0 in | Wt 261.0 lb

## 2018-11-11 DIAGNOSIS — Z9989 Dependence on other enabling machines and devices: Secondary | ICD-10-CM

## 2018-11-11 DIAGNOSIS — Z720 Tobacco use: Secondary | ICD-10-CM

## 2018-11-11 DIAGNOSIS — G4733 Obstructive sleep apnea (adult) (pediatric): Secondary | ICD-10-CM

## 2018-11-11 NOTE — Progress Notes (Signed)
[facility] [address] [place]. [place] [address] [phone]       VIRTUAL VISIT FOLLOW UP NOTE  Sherry Dennis Date of Birth:  1960/11/09 Medical Record Number:  [id number]    Virtual Visit via Video Note  I connected with Sherry Dennis on 11/11/18 at  1:45 PM EDT by a video enabled telemedicine application located in my own home and verified that I am speaking with the correct person using two identifiers who was located at their own home.   I discussed the limitations of evaluation and management by telemedicine and the availability of in person appointments. The patient expressed understanding and agreed to proceed.   CHIEF COMPLAINT:  Chief Complaint  Patient presents with   Follow-up    CIPAP follow up    HPI: Sherry Dennis is a 58 year old female who was initially scheduled for in office 14-month CPAP follow-up but due to [place] pandemic, office visits are limited therefore was transitioned to telemedicine via WebEx. She was initially evaluated in this office by Dr. Brett Fairy on 08/04/2018 for OSA evaluation.  She underwent home sleep test on 09/08/2018 which showed moderate severe sleep apnea with AHI 21.9/h and REM 46.5/h.  It was recommended at that time to initiate CPAP auto 5 to 15 cm H2O, 3 cm EPR, heated humidity and mask of patient's choice. CPAP download from 10/12/2018 - 11/10/2018 indicates excellent compliance with 30 out of 30 usage days and all 30 days greater than 4 hours usage for 100% compliance rate.  Average usage 7 hours and 13 minutes with residual AHI 3.4.  Auto CPAP peak average pressure 11.1 cm H2O on set pressures 5 cm H2O - 15 cm H20 with A-flex setting 3. She feels as though she has experienced great benefit with ongoing use of CPAP where she is sleeping better at night and has overall improvement with daytime fatigue.  She sleeps in her own bed with 2 pillows propped under her.  She does continue to use tobacco 0.5 packs/day and does have  a prescription for Chantix but is fearful of starting due to potential side effects such as night terrors and nausea.  She was evaluated by the bariatric surgeon and was approved to undergo bariatric surgery but is currently on hold due to current pandemic.  She is aware that she needs to quit smoking for the surgery along with importance for her overall health.  She is also noticed an improvement of her blood pressure which typically ranges 130s/80s.  She does have a blood pressure cuff at home with rating obtained during visit at 136/81 and heart rate 72.  She weighed herself yesterday at 261 pounds with BMI 43.4.  No further concerns at this time.      ROS:   14 system review of systems performed and negative with exception of seasonal allergies, nasal congestion and apnea  Epworth sleepiness scale: 10   PMH:  Past Medical History:  Diagnosis Date   Allergy    Arthritis    "knees, hands, ankles, ~ every joint" (04/08/2015)   Basal cell carcinoma    "face, hands, chest" (04/08/2015)   Chronic bronchitis ([place])    "get it pretty much q yr" (04/08/2015)   Dysrhythmia    palpitations occ; SVT 09/2013 s/p adenosine   GERD (gastroesophageal reflux disease)    H/O hiatal hernia    Hx of cardiovascular stress test    Lexiscan Myoview 6/16: Ejection fraction is 60% and wall motion is  normal. The study is normal. There is no scar or ischemia. This is a low risk scan.   Hyperlipidemia    Hypertension    Hypothyroidism    Palpitations    Pneumonia 2015 X 1   PONV (postoperative nausea and vomiting)    S/P total knee arthroplasty 02/15/2014   Thyroid disease    Vitamin D deficiency     PSH:  Past Surgical History:  Procedure Laterality Date   BASAL CELL CARCINOMA EXCISION  2014   "off my chest"   [place]   "lasered the holes shut"   CARDIAC SURGERY     CARPAL TUNNEL RELEASE Right 1990   COLONOSCOPY     COLONOSCOPY WITH PROPOFOL N/A 11/25/2017    Procedure: COLONOSCOPY WITH PROPOFOL;  Surgeon: Mauri Pole, MD;  Location: [facility];  Service: Endoscopy;  Laterality: N/A;   ELECTROPHYSIOLOGIC STUDY N/A 04/08/2015   Procedure: SVT Ablation;  Surgeon: Evans Lance, MD;  Location: [facility];  Service: Cardiovascular;  Laterality: N/A;   ENDOMETRIAL ABLATION  2009   INCONTINENCE SURGERY  2010   JOINT REPLACEMENT     KNEE ARTHROSCOPY Right 2013   KNEE ARTHROSCOPY     PARTIAL KNEE ARTHROPLASTY Right 02/15/2014   Procedure: RIGHT UNICOMPARTMENTAL KNEE (MEDIAL COMPARTMENT);  Surgeon: Vickey Huger, MD;  Location: [place];  Service: Orthopedics;  Laterality: Right;   POLYPECTOMY     TUBAL LIGATION  1987    Social History:  Social History   Socioeconomic History   Marital status: Divorced    Spouse name: Not on file   Number of children: 2   Years of education: Not on file   Highest education level: Not on file  Occupational History   Not on file  Social Needs   Financial resource strain: Not on file   Food insecurity:    Worry: Not on file    Inability: Not on file   Transportation needs:    Medical: Not on file    Non-medical: Not on file  Tobacco Use   Smoking status: Current Some Day Smoker    Packs/day: 1.50    Years: 40.00    Pack years: 60.00    Types: Cigarettes   Smokeless tobacco: Never Used   Tobacco comment: 04/08/2015 "went from ~ 2 ppd to 3 cigarettes in 1 week"  Substance and Sexual Activity   Alcohol use: No    Alcohol/week: 0.0 standard drinks    Comment: 04/08/2015 "couple drinks/year"   Drug use: No   Sexual activity: Not Currently  Lifestyle   Physical activity:    Days per week: Not on file    Minutes per session: Not on file   Stress: Not on file  Relationships   Social connections:    Talks on phone: Not on file    Gets together: Not on file    Attends religious service: Not on file    Active member of club or organization: Not on file    Attends  meetings of clubs or organizations: Not on file    Relationship status: Not on file   Intimate partner violence:    Fear of current or ex partner: Not on file    Emotionally abused: Not on file    Physically abused: Not on file    Forced sexual activity: Not on file  Other Topics Concern   Not on file  Social History Narrative   Not on file    Family  History:  Family History  Problem Relation Age of Onset   Hypertension Mother    Atrial fibrillation Mother    Diabetes Father    Emphysema Father    COPD Father    Breast cancer Paternal Aunt        x 4 aunts,    Stroke Maternal Grandmother    Heart attack Maternal Grandmother    Hyperlipidemia Maternal Grandfather    Heart disease Maternal Grandfather    Hyperlipidemia Paternal Grandmother    Heart disease Paternal Grandmother    Hyperlipidemia Paternal Grandfather    Heart disease Paternal Grandfather    Colon cancer Neg Hx    Colon polyps Neg Hx    Esophageal cancer Neg Hx    Rectal cancer Neg Hx    Ulcerative colitis Neg Hx    Stomach cancer Neg Hx     Medications:   Current Outpatient Medications on File Prior to Visit  Medication Sig Dispense Refill   acetaminophen (TYLENOL) 650 MG CR tablet Take 1,300 mg by mouth 2 (two) times daily as needed for pain.     bisoprolol-hydrochlorothiazide (ZIAC) 5-6.25 MG tablet Take 1 tablet by mouth daily. 90 tablet 1   Cholecalciferol (VITAMIN D3) 10000 units capsule Take 10,000 Units by mouth daily.     cyanocobalamin (,VITAMIN B-12,) 1000 MCG/ML injection Inject 1 mL (1,000 mcg total) into the skin every 30 (thirty) days. 30 mL 2   diphenhydrAMINE (BENADRYL) 25 MG tablet Take 25 mg by mouth daily.      escitalopram (LEXAPRO) 20 MG tablet TAKE 1 TABLET BY MOUTH EVERY DAY 30 tablet 5   leflunomide (ARAVA) 20 MG tablet Take 20 mg by mouth daily.     levothyroxine (SYNTHROID, LEVOTHROID) 137 MCG tablet Take one tablet by mouth every morning on empty  stomach with water, 61min prior to eating.  Wait 4 hours for antacids. 30 tablet 1   NON FORMULARY Apply 1 application topically at bedtime. Dermatologist Office Cream for Rosacea     olmesartan (BENICAR) 40 MG tablet Take 1 tablet daily for BP 90 tablet 1   ranitidine (ZANTAC) 75 MG tablet Take 75 mg by mouth daily as needed for heartburn.     Tofacitinib Citrate ER 11 MG TB24 Take 11 mg by mouth daily.     topiramate (TOPAMAX) 50 MG tablet Take 1 tablet (50 mg total) by mouth at bedtime. 30 tablet 2   VOLTAREN 1 % GEL Apply 2 g topically 4 (four) times daily as needed (joint pain). 100 g 2   levothyroxine (SYNTHROID, LEVOTHROID) 125 MCG tablet PLEASE SEE ATTACHED FOR DETAILED DIRECTIONS PER DOCTOR     No current facility-administered medications on file prior to visit.     Allergies:   Allergies  Allergen Reactions   Darvocet [Propoxyphene N-Acetaminophen] Anaphylaxis   Darvon [Propoxyphene Hcl] Anaphylaxis and Other (See Comments)    Airway swelling    Propoxyphene Anaphylaxis    Throat closes up   Doxycycline Nausea Only   Tamiflu [Oseltamivir Phosphate] Hives and Other (See Comments)    Increased blood pressure     Physical Exam  Vitals:   11/11/18 1400  BP: 136/81  Pulse: 72  Weight: 261 lb (118.4 kg)  Height: 5\' 5"  (1.651 m)   Body mass index is 43.43 kg/m. No exam data present  *Per patient report*  General: Pleasant obese middle-aged Caucasian female  Neurologic Exam Mental Status: Awake and fully alert. Oriented to place and time. Recent and remote memory  intact. Attention span, concentration and fund of knowledge appropriate. Mood and affect appropriate.  Cranial Nerves: Extraocular movements full without nystagmus.Face, tongue, palate moves normally and symmetrically.  Motor: No evidence of weakness appreciated with assessing for drift Coordination: Rapid alternating movements normal in all extremities. Finger-to-nose and heel-to-shin performed  accurately bilaterally.     ASSESSMENT/PLAN: Sherry Dennis is a 58 y.o. year old female who is followed in this office for diagnosis of OSA and recently initiated CPAP.  Her download report shows excellent compliance with optimal AHI.  Subjectively, patient reports satisfactory use with overall improvement of daytime fatigue and sleeping during the night.  No indication at this time to change any current settings and advised to continue excellent compliance.  Discussion regarding importance of tobacco cessation and trialing use of Chantix which she has a current prescription for by her PCP.  She has trialed bupropion in the past but unable to tolerate due to side effects of irritability and increased anxiety.  DME company [employer] and is aware to contact company with any CPAP concerns or need of supplies.  She will return in 6 months for CPAP compliance evaluation with Jinny Blossom, NP or call earlier if needed   Greater than 50% of time during this 25 minute visit was spent on discussing CPAP download report, education and discussion regarding importance of tobacco cessation and planning of further management    Venancio Poisson, AGNP-BC  [facility] [address] [place], [place] [address]  Phone [phone] Fax [phone] Note: This document was prepared with digital dictation and possible smart phrase technology. Any transcriptional errors that result from this process are unintentional.

## 2018-11-18 ENCOUNTER — Other Ambulatory Visit (HOSPITAL_COMMUNITY): Payer: Self-pay | Admitting: General Surgery

## 2018-11-18 ENCOUNTER — Other Ambulatory Visit: Payer: Self-pay | Admitting: General Surgery

## 2018-11-20 ENCOUNTER — Other Ambulatory Visit: Payer: Self-pay | Admitting: Physician Assistant

## 2018-11-20 DIAGNOSIS — Z6841 Body Mass Index (BMI) 40.0 and over, adult: Principal | ICD-10-CM

## 2018-11-26 ENCOUNTER — Encounter: Payer: 59 | Attending: General Surgery | Admitting: Dietician

## 2018-11-26 ENCOUNTER — Encounter: Payer: Self-pay | Admitting: Dietician

## 2018-11-26 ENCOUNTER — Other Ambulatory Visit: Payer: Self-pay

## 2018-11-26 VITALS — Ht 65.5 in | Wt 263.0 lb

## 2018-11-26 DIAGNOSIS — E669 Obesity, unspecified: Secondary | ICD-10-CM | POA: Insufficient documentation

## 2018-11-26 NOTE — Progress Notes (Signed)
Bariatric Pre-Op Nutrition Assessment Medical Nutrition Therapy  Appt Start Time: 2:00pm  End time: 3:05pm  Patient was seen on 11/26/2018 for Pre-Operative Nutrition Assessment. Assessment and letter of approval faxed to [facility] Surgery Bariatric Surgery Program coordinator on 11/27/2018.   Planned surgery: Sleeve Gastrectomy  Pt expectation of surgery: better health, less joint pain, ability to be there for family in the future, bend over easier, less about the "looks"  Pt expectation of dietitian: provide guidance with setting goals and developing new eating habits  Anthropometrics  Start weight at [facility]: 263 lbs (date: 11/26/2018) Height: 65.5 in BMI: 43.1 kg/m2    Clinical  Medical Hx: obesity, high blood pressure, hyperlipidemia, vit D deficiency, thyroid disease, palpitations, GERD, chronic bronchitis, arthritis  (Pt states development of hypothyroidism began onset of weight gain) Surgeries: tubal ligation, polypectomy, knee surgery, hand and heart ablations  Medications: Ziac, Lexapro, Arava, Synthroid, Benicar, Zantac, Topamax Allergies: Darvon, Propoxyphene, Doxycycline, Tamiflu   Psychosocial/Lifestyle Works as a Radiation protection practitioner for the [employer] Pt is a current smoker. R/t bariatric surgery: father passed away from morbid obesity, younger brother had bariatric surgery. Lives alone, has 2 children, and 6 grandchildren. Pt is very personable and is motivated to have surgery. States she wants surgery to better her overall health and is not as concerned with how she will look.   24-Hr Dietary Recall First Meal: usually skips (or biscuit)  Snack: popcorn or chips  Second Meal: bacon + eggs (or sandwich)  Snack: peanut butter crackers  Third Meal: pasta + chicken  Snack: none Beverages: water + black coffee + sweet tea  Food & Nutrition Related Hx Dietary Hx: Pt states she will eat either breakfast or lunch. Used to drink unsweet tea with stevia. Hx of  h. pylori, so tastes have not completely readjusted. Dislikes taste of pizza. Likes pasta. Only meat preferred is chicken and sometimes steak. Likes [place] [employer] mini subs. Doesn't eat a lot of bread at home, will occasionally eat a sandwich but dislikes deli meat. Not a sweets eater, doesn't keep sweets in the home. Doesn't eat seafood, might eat fish occasionally.  Estimated Daily Fluid Intake: 48 oz tea + 32 oz water  Supplements: none  GI / Other Notable Symptoms: constipation (takes Miralax daily)  Physical Activity  Current average weekly physical activity: usually swims 1 hour x 3 days/week (gyms/pools currently closed d/t COVID-19)  Estimated Energy Needs Calories: 1600 Carbohydrate: 180g Protein: 120g Fat: 44g  Pre-Op Goals Reviewed with the Patient . Track food and beverage intake (try MyFitness Pal or the Baritastic app) . Make healthy food choices while monitoring portion sizes . Avoid concentrated sugars and fried foods . Keep fat & sugar in the single digits per serving on food labels . Practice CHEWING your food (aim for applesauce consistency) . Practice not drinking 15 minutes before, during, and 30 minutes after each meal and snack . Avoid all carbonated beverages (ex: soda, sparkling beverages)  . Limit caffeinated beverages (ex: coffee, tea, energy drinks) . Avoid all sugar-sweetened beverages (ex: regular soda, sports drinks)  . Avoid alcohol  . Consume 3 meals per day or try to eat every 3-5 hours . Make a list of non-food related activities . Aim for 64-100 ounces of FLUID daily (with at least half of fluid intake being plain water)  . Aim for at least 60-80 grams of PROTEIN daily . Look for a liquid protein source that contains ?15 g protein and ?5 g carbohydrate (ex:  shakes, drinks, shots) . Physical activity is an important part of a healthy lifestyle so keep it moving! The goal is to reach 150 minutes of exercise per week, including cardiovascular and weight  baring activity.  *Goals that are bolded indicate the pt would like to start working towards these  Handouts Provided Include  . Bariatric Surgery handouts (Nutrition Visits, Pre-Op Goals, Protein Shakes, Vitamins & Minerals, Support Group 2020 Schedule) . Protein2O sample and coupons  Learning Style & Readiness for Change Teaching method utilized: Visual & Auditory  Demonstrated degree of understanding via: Teach Back  Barriers to learning/adherence to lifestyle change: None Identified  Next Steps Supervised Weight Loss (SWL) Visits Needed: 0  Patient is to call [facility] to enroll in Pre-Op Class (>2 weeks before surgery) and Post-Op Class (2 weeks after surgery) for further nutrition education when surgery date is scheduled.

## 2018-11-26 NOTE — Patient Instructions (Addendum)
Begin working through the [employer] we discussed today, starting with the following:  . Avoid all sugar-sweetened beverages (ex: regular soda, sports drinks) by starting to cut back on sweet tea. Tip: cut back slowly over time, rather than all at once!  Start with 1 goal, then add another goal or 2 every couple of weeks to avoid being overwhelmed with changes. Small changes over time add up! Keep up the motivation and you will be set up for success.   See you at Pre-Op Class!

## 2018-12-18 ENCOUNTER — Other Ambulatory Visit: Payer: Self-pay | Admitting: Adult Health

## 2018-12-19 ENCOUNTER — Encounter: Payer: Self-pay | Admitting: Gastroenterology

## 2018-12-25 NOTE — Progress Notes (Deleted)
Assessment and Plan:  Essential hypertension - continue medications, DASH diet, exercise and monitor at home. Call if greater than 130/80.  -     CBC with Differential/Platelet -     BASIC METABOLIC PANEL WITH GFR -     Hepatic function panel  PSVT (paroxysmal supraventricular tachycardia) (HCC) Controlled, monitor, get sleep study  Thyroid disease Continue to follow up Dr. Darnell Level  Mixed hyperlipidemia -continue medications, check lipids, decrease fatty foods, increase activity.  -     Lipid panel  Tobacco use disorder Smoking cessation-  instruction/counseling given, counseled patient on the dangers of tobacco use, advised patient to stop smoking, and reviewed strategies to maximize success, patient not ready to quit at this time.   Vitamin D deficiency  Class 3 severe obesity due to excess calories with serious comorbidity and body mass index (BMI) of 40.0 to 44.9 in adult [facility]) - long discussion about weight loss, diet, and exercise -sleep study scheduled for in the 27th  Prediabetes -     Hemoglobin A1c  Medication management -     Magnesium  Depression Increase lexapro, doing well  Continue diet and meds as discussed. Further disposition pending results of labs. Future Appointments  Date Time Provider [place]  12/30/2018  3:45 PM Vicie Mutters, PA-C [name] None  09/01/2019  3:00 PM Vicie Mutters, PA-C [name] None    HPI 58 y.o. female  presents for 3 month follow up with hypertension, hyperlipidemia, prediabetes and vitamin D and CPE  Her blood pressure has been controlled at home, her BP is doing better with ziac, today their BP is     Patient has history of ablation for SVT with Dr. Lajuana Ripple in 2016.  She has a history of RA and was on humira being switched to [place] 11mg  once daily, still on the arava as well following with Dr. Amil Amen.   She was started on the lexapro last visit 10mg  and she is doing better.  She does not workout.  She  denies chest pain, shortness of breath, dizziness.  She is not on cholesterol medication and denies myalgias. Her cholesterol is at goal. The cholesterol last visit was:   Lab Results  Component Value Date   CHOL 204 (H) 08/25/2018   HDL 44 (L) 08/25/2018   LDLCALC 111 (H) 08/25/2018   TRIG 343 (H) 08/25/2018   CHOLHDL 4.6 08/25/2018   She has been working on diet and exercise for prediabetes, and denies paresthesia of the feet, polydipsia and polyuria. Last A1C in the office was:  Lab Results  Component Value Date   HGBA1C 5.9 (H) 08/25/2018   Patient is on Vitamin D supplement, she is on 10,000.   Lab Results  Component Value Date   VD25OH 31 08/12/2018     She is on thyroid medication. Her medication was changed, 132mcg to the 180mcg daily with water 1 hour before food, has only been on it a few days.   Lab Results  Component Value Date   TSH 9.14 (H) 08/12/2018  .  BMI is There is no height or weight on file to calculate BMI. She has sleep study scheduled for in Jan 27th. She could not sleep with the phentermine, got mean on the wellbutrin. She struggles with night time eating and mindless eating. Will start to get [facility] center.  Wt Readings from Last 3 Encounters:  11/26/18 263 lb (119.3 kg)  11/11/18 261 lb (118.4 kg)  09/22/18 251 lb 6.4  oz (114 kg)   She has a B12 def, needs shots Lab Results  Component Value Date   VITAMINB12 208 08/12/2018    Current Medications:  Current Outpatient Medications on File Prior to Visit  Medication Sig Dispense Refill  . acetaminophen (TYLENOL) 650 MG CR tablet Take 1,300 mg by mouth 2 (two) times daily as needed for pain.    . bisoprolol-hydrochlorothiazide (ZIAC) 5-6.25 MG tablet Take 1 tablet by mouth daily. 90 tablet 1  . Cholecalciferol (VITAMIN D3) 10000 units capsule Take 10,000 Units by mouth daily.    . cyanocobalamin (,VITAMIN B-12,) 1000 MCG/ML injection Inject 1 mL (1,000 mcg total) into the skin every  30 (thirty) days. 30 mL 2  . diphenhydrAMINE (BENADRYL) 25 MG tablet Take 25 mg by mouth daily.     Marland Kitchen escitalopram (LEXAPRO) 20 MG tablet TAKE 1 TABLET BY MOUTH EVERY DAY 30 tablet 5  . leflunomide (ARAVA) 20 MG tablet Take 20 mg by mouth daily.    Marland Kitchen levothyroxine (SYNTHROID) 125 MCG tablet TAKE 1 TABLET BY MOUTH EVERY DAY 30 tablet 1  . levothyroxine (SYNTHROID, LEVOTHROID) 137 MCG tablet Take one tablet by mouth every morning on empty stomach with water, 45min prior to eating.  Wait 4 hours for antacids. 30 tablet 1  . NON FORMULARY Apply 1 application topically at bedtime. Dermatologist Office Cream for Rosacea    . olmesartan (BENICAR) 40 MG tablet Take 1 tablet daily for BP 90 tablet 1  . ranitidine (ZANTAC) 75 MG tablet Take 75 mg by mouth daily as needed for heartburn.    . Tofacitinib Citrate ER 11 MG TB24 Take 11 mg by mouth daily.    Marland Kitchen topiramate (TOPAMAX) 50 MG tablet TAKE 1 TABLET BY MOUTH EVERYDAY AT BEDTIME 30 tablet 2  . VOLTAREN 1 % GEL Apply 2 g topically 4 (four) times daily as needed (joint pain). 100 g 2   No current facility-administered medications on file prior to visit.    Medical History:  Past Medical History:  Diagnosis Date  . Allergy   . Arthritis    "knees, hands, ankles, ~ every joint" (04/08/2015)  . Basal cell carcinoma    "face, hands, chest" (04/08/2015)  . Chronic bronchitis ([place])    "get it pretty much q yr" (04/08/2015)  . Dysrhythmia    palpitations occ; SVT 09/2013 s/p adenosine  . GERD (gastroesophageal reflux disease)   . H/O hiatal hernia   . Hx of cardiovascular stress test    Lexiscan Myoview 6/16: Ejection fraction is 60% and wall motion is normal. The study is normal. There is no scar or ischemia. This is a low risk scan.  . Hyperlipidemia   . Hypertension   . Hypothyroidism   . Palpitations   . Pneumonia 2015 X 1  . PONV (postoperative nausea and vomiting)   . S/P total knee arthroplasty 02/15/2014  . Thyroid disease   . Vitamin D  deficiency    Immunization History  Administered Date(s) Administered  . Influenza-Unspecified 05/14/2015, 05/27/2017  . PPD Test 12/02/2013  . Pneumococcal Polysaccharide-23 04/08/2012  . Td 10/16/2004  . Tdap 07/24/2017   Colonoscopy 11/2017 MGM 02/2017 Echo 2015 Stress test 2016 CT adrenal 2019 CTA chest 2020 PAP 2010 DUE follow up GYN  No LMP recorded. Patient has had an ablation.   Allergies Allergies  Allergen Reactions  . Darvocet [Propoxyphene N-Acetaminophen] Anaphylaxis  . Darvon [Propoxyphene Hcl] Anaphylaxis and Other (See Comments)    Airway swelling   .  Propoxyphene Anaphylaxis    Throat closes up  . Doxycycline Nausea Only  . Tamiflu [Oseltamivir Phosphate] Hives and Other (See Comments)    Increased blood pressure    SURGICAL HISTORY She  has a past surgical history that includes Carpal tunnel release (Right, 1990); Tubal ligation (1987); Knee arthroscopy (Right, 2013); Bladder repair (1993); Endometrial ablation (2009); Excision basal cell carcinoma (2014); Partial knee arthroplasty (Right, 02/15/2014); Cardiac catheterization (N/A, 04/08/2015); Joint replacement; Incontinence surgery (2010); Knee arthroscopy; Cardiac surgery; Colonoscopy; Polypectomy; and Colonoscopy with propofol (N/A, 11/25/2017). FAMILY HISTORY Her family history includes Atrial fibrillation in her mother; Breast cancer in her paternal aunt; COPD in her father; Diabetes in her father; Emphysema in her father; Heart attack in her maternal grandmother; Heart disease in her maternal grandfather, paternal grandfather, and paternal grandmother; Hyperlipidemia in her maternal grandfather, paternal grandfather, and paternal grandmother; Hypertension in her mother; Stroke in her maternal grandmother. SOCIAL HISTORY She  reports that she has been smoking cigarettes. She has a 60.00 pack-year smoking history. She has never used smokeless tobacco. She reports that she does not drink alcohol or use  drugs.   Review of Systems  Constitutional: Positive for malaise/fatigue. Negative for chills, diaphoresis, fever and weight loss.  HENT: Negative.   Eyes: Negative.   Respiratory: Negative.   Cardiovascular: Negative.   Gastrointestinal: Negative.   Genitourinary: Negative.   Musculoskeletal: Positive for neck pain. Negative for back pain, falls, joint pain and myalgias.  Skin: Negative.   Neurological: Positive for tingling and sensory change (bilateral arms from neck). Negative for dizziness, tremors, speech change, focal weakness, seizures, loss of consciousness, weakness and headaches.  Psychiatric/Behavioral: Positive for depression. Negative for hallucinations, memory loss, substance abuse and suicidal ideas. The patient has insomnia. The patient is not nervous/anxious.     Physical Exam: There were no vitals taken for this visit. Wt Readings from Last 3 Encounters:  11/26/18 263 lb (119.3 kg)  11/11/18 261 lb (118.4 kg)  09/22/18 251 lb 6.4 oz (114 kg)   General Appearance: Well nourished, in no apparent distress. Eyes: PERRLA, EOMs, conjunctiva no swelling or erythema Sinuses: No Frontal/maxillary tenderness ENT/Mouth: Ext aud canals clear, TMs without erythema, bulging. No erythema, swelling, or exudate on post pharynx.  Tonsils not swollen or erythematous. Hearing normal.  Neck: Supple, thyroid normal.  Respiratory: Respiratory effort normal, BS equal bilaterally without rales, rhonchi, wheezing or stridor.  Cardio: RRR with no MRGs. Brisk peripheral pulses without edema.  Abdomen: Soft, + BS, obese Non tender, no guarding, rebound, hernias, masses. Lymphatics: Non tender without lymphadenopathy.  Musculoskeletal: Full ROM, 5/5 strength, antalgic gait Skin: Warm, dry without rashes, lesions, ecchymosis.  Neuro: Cranial nerves intact. Normal muscle tone, no cerebellar symptoms. Sensation intact.  Psych: Awake and oriented X 3, normal affect, Insight and Judgment  appropriate.    Vicie Mutters, PA-C 1:36 PM [facility]

## 2018-12-30 ENCOUNTER — Ambulatory Visit: Payer: Self-pay | Admitting: Physician Assistant

## 2019-01-06 ENCOUNTER — Ambulatory Visit: Payer: 59 | Admitting: Skilled Nursing Facility1

## 2019-01-07 NOTE — Progress Notes (Signed)
Assessment and Plan:   Essential hypertension - continue medications, DASH diet, exercise and monitor at home. Call if greater than 130/80.  -     CBC with Differential/Platelet -     COMPLETE METABOLIC PANEL WITH GFR -     TSH  PSVT (paroxysmal supraventricular tachycardia) (HCC) Control blood pressure, cholesterol, glucose, increase exercise.   SVT (supraventricular tachycardia) (HCC) Continue cardio follow up  Aortic atherosclerosis (HCC) Control blood pressure, cholesterol, glucose, increase exercise.   Chronic obstructive pulmonary disease, unspecified COPD type ([place]) Given breo, get Xray  OSA and COPD overlap syndrome ([place]) Continue cPAP  Nocturnal hypoxemia due to emphysema (HCC) Continue CPAP  Rheumatoid arthritis, involving unspecified site, unspecified rheumatoid factor presence ([place]) Continue follow up  Thyroid disease Hypothyroidism-check TSH level, continue medications the same, reminded to take on an empty stomach 30-79mins before food.   Mixed hyperlipidemia check lipids decrease fatty foods increase activity.  -     Lipid panel check lipids decrease fatty foods increase activity.   Vitamin D deficiency -     VITAMIN D 25 Hydroxy (Vit-D Deficiency, Fractures)  Abnormal glucose -     Hemoglobin A1c Discussed disease progression and risks Discussed diet/exercise, weight management and risk modification  Medication management -     Magnesium  Wheezing Given breo, get on protonix for possible LPR Check BNP/chest xray due to swelling and weight gain, no PND/orthopnea and no rhonchi/JVD on exam Has echo normal 2015 -     Brain natriuretic peptide -     DG Chest 2 View; Future -     pantoprazole (PROTONIX) 20 MG tablet; Take 1 tablet (20 mg total) by mouth daily.     Continue diet and meds as discussed. Further disposition pending results of labs. Future Appointments  Date Time Provider [place]  01/19/2019  9:30 AM [facility] R/F 1 [facility]  [place]  01/19/2019 10:00 AM [facility] 5 [facility] [place]  09/01/2019  3:00 PM Vicie Mutters, PA-C [name] None    HPI 58 y.o. female  presents for 3 month follow up with hypertension, hyperlipidemia, prediabetes and vitamin D and CPE  Her blood pressure has been controlled at home, her BP is doing better with ziac, today their BP is BP: 136/88   Patient has history of ablation for SVT with Dr. Lajuana Ripple in 2016.  She has a history of RA and was on humira being switched to [place] 11mg  once daily, still on the arava as well following with Dr. Amil Amen.   She has wheezing, worse with sitting down, has cough, reflux, has history of hiatal hernia.  No SOB, fever, chills, CP.  She does have swelling in her legs bilateral, she sleeps with 2 pillows (unchagned), no PND she is on CPAP.   She is in the process of getting precert for gastric sleeve, she is exctied about this, has failed many weight loss options and at this time due to her back pain she has not been able to walk/workout and has weight gain. Getting injection June 11th.   BMI is Body mass index is 45.76 kg/m., she is working on diet and exercise. Wt Readings from Last 3 Encounters:  01/08/19 275 lb (124.7 kg)  11/26/18 263 lb (119.3 kg)  11/11/18 261 lb (118.4 kg)   She is on 20mg  of lexapro and she is doing better. She does not workout.  She denies chest pain, shortness of breath, dizziness.  She is not on cholesterol medication and denies myalgias. Her  cholesterol is at goal. The cholesterol last visit was:   Lab Results  Component Value Date   CHOL 204 (H) 08/25/2018   HDL 44 (L) 08/25/2018   LDLCALC 111 (H) 08/25/2018   TRIG 343 (H) 08/25/2018   CHOLHDL 4.6 08/25/2018   She has been working on diet and exercise for prediabetes, and denies paresthesia of the feet, polydipsia and polyuria. Last A1C in the office was:  Lab Results  Component Value Date   HGBA1C 5.9 (H) 08/25/2018   Patient is on Vitamin D  supplement, she is on 10,000.   Lab Results  Component Value Date   VD25OH 31 08/12/2018     She is on thyroid medication. Her medication was changed, 147mcg to the 173mcg daily with water 1 hour before food, has only been on it a few days.   Lab Results  Component Value Date   TSH 9.14 (H) 08/12/2018  .  She has a B12 def, on shots, last one was last month Lab Results  Component Value Date   VITAMINB12 208 08/12/2018    Current Medications:  Current Outpatient Medications on File Prior to Visit  Medication Sig Dispense Refill  . acetaminophen (TYLENOL) 650 MG CR tablet Take 1,300 mg by mouth 2 (two) times daily as needed for pain.    . bisoprolol-hydrochlorothiazide (ZIAC) 5-6.25 MG tablet Take 1 tablet by mouth daily. 90 tablet 1  . Cholecalciferol (VITAMIN D3) 10000 units capsule Take 10,000 Units by mouth daily.    . cyanocobalamin (,VITAMIN B-12,) 1000 MCG/ML injection Inject 1 mL (1,000 mcg total) into the skin every 30 (thirty) days. 30 mL 2  . diphenhydrAMINE (BENADRYL) 25 MG tablet Take 25 mg by mouth daily.     Marland Kitchen escitalopram (LEXAPRO) 20 MG tablet TAKE 1 TABLET BY MOUTH EVERY DAY 30 tablet 5  . leflunomide (ARAVA) 20 MG tablet Take 20 mg by mouth daily.    Marland Kitchen levothyroxine (SYNTHROID) 125 MCG tablet TAKE 1 TABLET BY MOUTH EVERY DAY 30 tablet 1  . levothyroxine (SYNTHROID, LEVOTHROID) 137 MCG tablet Take one tablet by mouth every morning on empty stomach with water, 62min prior to eating.  Wait 4 hours for antacids. 30 tablet 1  . NON FORMULARY Apply 1 application topically at bedtime. Dermatologist Office Cream for Rosacea    . olmesartan (BENICAR) 40 MG tablet Take 1 tablet daily for BP 90 tablet 1  . ranitidine (ZANTAC) 75 MG tablet Take 75 mg by mouth daily as needed for heartburn.    . Tofacitinib Citrate ER 11 MG TB24 Take 11 mg by mouth daily.    Marland Kitchen topiramate (TOPAMAX) 50 MG tablet TAKE 1 TABLET BY MOUTH EVERYDAY AT BEDTIME 30 tablet 2  . VOLTAREN 1 % GEL Apply 2 g  topically 4 (four) times daily as needed (joint pain). 100 g 2   No current facility-administered medications on file prior to visit.    Medical History:  Past Medical History:  Diagnosis Date  . Allergy   . Arthritis    "knees, hands, ankles, ~ every joint" (04/08/2015)  . Basal cell carcinoma    "face, hands, chest" (04/08/2015)  . Chronic bronchitis ([place])    "get it pretty much q yr" (04/08/2015)  . Dysrhythmia    palpitations occ; SVT 09/2013 s/p adenosine  . GERD (gastroesophageal reflux disease)   . H/O hiatal hernia   . Hx of cardiovascular stress test    Lexiscan Myoview 6/16: Ejection fraction is 60% and wall motion  is normal. The study is normal. There is no scar or ischemia. This is a low risk scan.  . Hyperlipidemia   . Hypertension   . Hypothyroidism   . Palpitations   . Pneumonia 2015 X 1  . PONV (postoperative nausea and vomiting)   . S/P total knee arthroplasty 02/15/2014  . Thyroid disease   . Vitamin D deficiency    Immunization History  Administered Date(s) Administered  . Influenza-Unspecified 05/14/2015, 05/27/2017  . PPD Test 12/02/2013  . Pneumococcal Polysaccharide-23 04/08/2012  . Td 10/16/2004  . Tdap 07/24/2017   Colonoscopy 11/2017 MGM 02/2017 Echo 2015 Stress test 2016 CT adrenal 2019 CTA chest 2020 PAP 2010 DUE follow up GYN  No LMP recorded. Patient has had an ablation.   Allergies Allergies  Allergen Reactions  . Darvocet [Propoxyphene N-Acetaminophen] Anaphylaxis  . Darvon [Propoxyphene Hcl] Anaphylaxis and Other (See Comments)    Airway swelling   . Propoxyphene Anaphylaxis    Throat closes up  . Doxycycline Nausea Only  . Tamiflu [Oseltamivir Phosphate] Hives and Other (See Comments)    Increased blood pressure    SURGICAL HISTORY She  has a past surgical history that includes Carpal tunnel release (Right, 1990); Tubal ligation (1987); Knee arthroscopy (Right, 2013); Bladder repair (1993); Endometrial ablation (2009);  Excision basal cell carcinoma (2014); Partial knee arthroplasty (Right, 02/15/2014); Cardiac catheterization (N/A, 04/08/2015); Joint replacement; Incontinence surgery (2010); Knee arthroscopy; Cardiac surgery; Colonoscopy; Polypectomy; and Colonoscopy with propofol (N/A, 11/25/2017). FAMILY HISTORY Her family history includes Atrial fibrillation in her mother; Breast cancer in her paternal aunt; COPD in her father; Diabetes in her father; Emphysema in her father; Heart attack in her maternal grandmother; Heart disease in her maternal grandfather, paternal grandfather, and paternal grandmother; Hyperlipidemia in her maternal grandfather, paternal grandfather, and paternal grandmother; Hypertension in her mother; Stroke in her maternal grandmother. SOCIAL HISTORY She  reports that she has been smoking cigarettes. She has a 60.00 pack-year smoking history. She has never used smokeless tobacco. She reports that she does not drink alcohol or use drugs.   Review of Systems  Constitutional: Positive for malaise/fatigue. Negative for chills, diaphoresis, fever and weight loss.  HENT: Negative.   Eyes: Negative.   Respiratory: Positive for cough, shortness of breath (with exeriton) and wheezing.   Cardiovascular: Positive for leg swelling. Negative for chest pain, palpitations, orthopnea, claudication and PND.  Gastrointestinal: Negative.   Genitourinary: Negative.   Musculoskeletal: Positive for back pain. Negative for falls, joint pain, myalgias and neck pain.  Skin: Negative.   Neurological: Negative for dizziness, tingling, tremors, sensory change, speech change, focal weakness, seizures, loss of consciousness, weakness and headaches.  Psychiatric/Behavioral: Negative for depression, hallucinations, memory loss, substance abuse and suicidal ideas. The patient is not nervous/anxious and does not have insomnia.     Physical Exam: BP 136/88   Pulse 87   Temp (!) 94.5 F (34.7 C)   Ht 5\' 5"  (1.651 m)    Wt 275 lb (124.7 kg)   SpO2 94%   BMI 45.76 kg/m  Wt Readings from Last 3 Encounters:  01/08/19 275 lb (124.7 kg)  11/26/18 263 lb (119.3 kg)  11/11/18 261 lb (118.4 kg)   General Appearance: Well nourished, in no apparent distress. Eyes: PERRLA, EOMs, conjunctiva no swelling or erythema Sinuses: No Frontal/maxillary tenderness ENT/Mouth: Ext aud canals clear, TMs without erythema, bulging. No erythema, swelling, or exudate on post pharynx.  Tonsils not swollen or erythematous. Hearing normal.  Neck: Supple, thyroid normal.  Respiratory:  Respiratory effort normal, BS equal bilaterally with wheezing left lower lung rhonchi, or stridor.  Cardio: RRR with no MRGs. Brisk peripheral pulses with 2+ edema.  Abdomen: Soft, + BS, obese Non tender, no guarding, rebound, hernias, masses. Lymphatics: Non tender without lymphadenopathy.  Musculoskeletal: Full ROM, 5/5 strength, antalgic gait Skin: Warm, dry without rashes, lesions, ecchymosis.  Neuro: Cranial nerves intact. Normal muscle tone, no cerebellar symptoms. Sensation intact.  Psych: Awake and oriented X 3, normal affect, Insight and Judgment appropriate.    Vicie Mutters, PA-C 4:49 PM [facility]

## 2019-01-08 ENCOUNTER — Other Ambulatory Visit: Payer: Self-pay

## 2019-01-08 ENCOUNTER — Ambulatory Visit: Payer: 59 | Admitting: Physician Assistant

## 2019-01-08 ENCOUNTER — Encounter: Payer: Self-pay | Admitting: Physician Assistant

## 2019-01-08 VITALS — BP 136/88 | HR 87 | Temp 94.5°F | Ht 65.0 in | Wt 275.0 lb

## 2019-01-08 DIAGNOSIS — E559 Vitamin D deficiency, unspecified: Secondary | ICD-10-CM

## 2019-01-08 DIAGNOSIS — G4736 Sleep related hypoventilation in conditions classified elsewhere: Secondary | ICD-10-CM

## 2019-01-08 DIAGNOSIS — J449 Chronic obstructive pulmonary disease, unspecified: Secondary | ICD-10-CM | POA: Diagnosis not present

## 2019-01-08 DIAGNOSIS — G4733 Obstructive sleep apnea (adult) (pediatric): Secondary | ICD-10-CM

## 2019-01-08 DIAGNOSIS — I471 Supraventricular tachycardia: Secondary | ICD-10-CM

## 2019-01-08 DIAGNOSIS — R7309 Other abnormal glucose: Secondary | ICD-10-CM

## 2019-01-08 DIAGNOSIS — I700 Atherosclerosis of aorta: Secondary | ICD-10-CM

## 2019-01-08 DIAGNOSIS — I10 Essential (primary) hypertension: Secondary | ICD-10-CM | POA: Diagnosis not present

## 2019-01-08 DIAGNOSIS — E079 Disorder of thyroid, unspecified: Secondary | ICD-10-CM

## 2019-01-08 DIAGNOSIS — Z79899 Other long term (current) drug therapy: Secondary | ICD-10-CM

## 2019-01-08 DIAGNOSIS — J439 Emphysema, unspecified: Secondary | ICD-10-CM

## 2019-01-08 DIAGNOSIS — E782 Mixed hyperlipidemia: Secondary | ICD-10-CM

## 2019-01-08 DIAGNOSIS — R062 Wheezing: Secondary | ICD-10-CM

## 2019-01-08 DIAGNOSIS — M069 Rheumatoid arthritis, unspecified: Secondary | ICD-10-CM

## 2019-01-08 MED ORDER — PANTOPRAZOLE SODIUM 20 MG PO TBEC: 20.0000 mg | DELAYED_RELEASE_TABLET | Freq: Every day | ORAL | 1 refills | Status: DC

## 2019-01-08 NOTE — Patient Instructions (Addendum)
INFORMATION ABOUT YOUR XRAY  Can walk into [address] for an Insurance account manager. They will have the order and take you back. You do not any paper work, I should get the result back today or tomorrow. This order is good for a year.  Can call [phone] to schedule an appointment if you wish.    When it comes to diets, agreement about the perfect plan isn't easy to find, even among the experts. Experts at the [employer] developed an idea known as the Healthy Eating Plate. Just imagine a plate divided into logical, healthy portions.  The emphasis is on diet quality:  Load up on vegetables and fruits - one-half of your plate: Aim for color and variety, and remember that potatoes don't count.  Go for whole grains - one-quarter of your plate: Whole wheat, barley, wheat berries, quinoa, oats, brown rice, and foods made with them. If you want pasta, go with whole wheat pasta.  Protein power - one-quarter of your plate: Fish, chicken, beans, and nuts are all healthy, versatile protein sources. Limit red meat.  The diet, however, does go beyond the plate, offering a few other suggestions.  Use healthy plant oils, such as olive, canola, soy, corn, sunflower and peanut. Check the labels, and avoid partially hydrogenated oil, which have unhealthy trans fats.  If you're thirsty, drink water. Coffee and tea are good in moderation, but skip sugary drinks and limit milk and dairy products to one or two daily servings.  The type of carbohydrate in the diet is more important than the amount. Some sources of carbohydrates, such as vegetables, fruits, whole grains, and beans-are healthier than others.  Finally, stay active.  Silent reflux: Not all heartburn burns...Marland KitchenMarland KitchenMarland Kitchen  What is LPR? Laryngopharyngeal reflux (LPR) or silent reflux is a condition in which acid that is made in the stomach travels up the esophagus (swallowing tube) and gets to the throat. Not everyone with reflux  has a lot of heartburn or indigestion. In fact, many people with LPR never have heartburn. This is why LPR is called SILENT REFLUX, and the terms "Silent reflux" and "LPR" are often used interchangeably. Because LPR is silent, it is sometimes difficult to diagnose.  How can you tell if you have LPR?  Marland Kitchen Chronic hoarseness- Some people have hoarseness that comes and goes . throat clearing  . Cough . It can cause shortness of breath and cause asthma like symptoms. Marland Kitchen a feeling of a lump in the throat  . difficulty swallowing . a problem with too much nose and throat drainage.  . Some people will feel their esophagus spasm which feels like their heart beating hard and fast, this will usually be after a meal, at rest, or lying down at night.    How do I treat this? Treatment for LPR should be individualized, and your doctor will suggest the best treatment for you. Generally there are several treatments for LPR: . changing habits and diet to reduce reflux,  . medications to reduce stomach acid, and  . surgery to prevent reflux. Most people with LPR need to modify how and when they eat, as well as take some medication, to get well. Sometimes, nonprescription liquid antacids, such as Maalox, Gelucil and Mylanta are recommended. When used, these antacids should be taken four times each day - one tablespoon one hour after each meal and before bedtime. Dietary and lifestyle changes alone are not often enough to control LPR - medications  that reduce stomach acid are also usually needed. These must be prescribed by our doctor.   TIPS FOR REDUCING REFLUX AND LPR Control your LIFE-STYLE and your DIET! Marland Kitchen If you use tobacco, QUIT.  Marland Kitchen Smoking makes you reflux. After every cigarette you have some LPR.  . Don't wear clothing that is too tight, especially around the waist (trousers, corsets, belts).  . Do not lie down just after eating...in fact, do not eat within three hours of bedtime.  . You should be on  a low-fat diet.  . Limit your intake of red meat.  . Limit your intake of butter.  Marland Kitchen Avoid fried foods.  . Avoid chocolate  . Avoid cheese.  Marland Kitchen Avoid eggs. Marland Kitchen Specifically avoid caffeine (especially coffee and tea), soda pop (especially cola) and mints.  . Avoid alcoholic beverages, particularly in the evening.   INFORMATION ABOUT YOUR STEROID INHALER  Can do steroid inhaler, NEED TO DO DAILY, this is NOT a rescue inhaler so if you are acutely short of breath please use your albuterol or call 911.  Do 1 puff ONCE a day.  Do before you brush your teeth OR wash your mouth afterwards.  IF YOU DO NOT [place] YOUR MOUTH OUT IT CAN CAUSE YEAST Can do 2 tsp vinegar with water and switch to help prevent yeast or help yeast in your mouth.   Go to the ER if any chest pain, shortness of breath, nausea, dizziness, severe HA, changes vision/speech   Do the following things EVERYDAY: 1) Weigh yourself in the morning before breakfast or at the same time every day. Write it down and keep it in a log. 2) Take your medicines as prescribed 3) Eat low salt foods-Limit salt (sodium) to 2000 mg per day. Best thing to do is avoid processed foods.   4) Stay as active as you can everyday 5) Limit all fluids for the day to less than 1.5 liters  Call your doctor if:  Anytime you have any of the following symptoms:  1) 2 pound weight gain in 24 hours or 5 pounds in 1 week  2) shortness of breath, with or without a dry hacking cough  3) swelling in the hands, LEGs, feet or stomach  4) if you have to sleep on extra pillows at night in order to breathe. 5) after laying down at night for 20-30 mins, you wake up short of breath.   These can all be signs of fluid overload.    Heart Failure Heart failure means your heart has trouble pumping blood. This makes it hard for your body to work well. Heart failure is usually a long-term (chronic) condition. You must take good care of yourself and follow your doctor's  treatment plan. Follow these instructions at home:  Take your heart medicine as told by your doctor. ? Do not stop taking medicine unless your doctor tells you to. ? Do not skip any dose of medicine. ? Refill your medicines before they run out. ? Take other medicines only as told by your doctor or pharmacist.  Stay active if told by your doctor. The elderly and people with severe heart failure should talk with a doctor about physical activity.  Eat heart-healthy foods. Choose foods that are without trans fat and are low in saturated fat, cholesterol, and salt (sodium). This includes fresh or frozen fruits and vegetables, fish, lean meats, fat-free or low-fat dairy foods, whole grains, and high-fiber foods. Lentils and dried peas and beans (legumes) are  also good choices.  Limit salt if told by your doctor.  [place] in a healthy way. Roast, grill, broil, bake, poach, steam, or stir-fry foods.  Limit fluids as told by your doctor.  Weigh yourself every morning. Do this after you pee (urinate) and before you eat breakfast. Write down your weight to give to your doctor.  Take your blood pressure and write it down if your doctor tells you to.  Ask your doctor how to check your pulse. Check your pulse as told.  Lose weight if told by your doctor.  Stop smoking or chewing tobacco. Do not use gum or patches that help you quit without your doctor's approval.  Schedule and go to doctor visits as told.  Nonpregnant women should have no more than 1 drink a day. Men should have no more than 2 drinks a day. Talk to your doctor about drinking alcohol.  Stop illegal drug use.  Stay current with shots (immunizations).  Manage your health conditions as told by your doctor.  Learn to manage your stress.  Rest when you are tired.  If it is really hot outside: ? Avoid intense activities. ? Use air conditioning or fans, or get in a cooler place. ? Avoid caffeine and alcohol. ? Wear  loose-fitting, lightweight, and light-colored clothing.  If it is really cold outside: ? Avoid intense activities. ? Layer your clothing. ? Wear mittens or gloves, a hat, and a scarf when going outside. ? Avoid alcohol.  Learn about heart failure and get support as needed.  Get help to maintain or improve your quality of life and your ability to care for yourself as needed. Contact a doctor if:  You gain weight quickly.  You are more short of breath than usual.  You cannot do your normal activities.  You tire easily.  You cough more than normal, especially with activity.  You have any or more puffiness (swelling) in areas such as your hands, feet, ankles, or belly (abdomen).  You cannot sleep because it is hard to breathe.  You feel like your heart is beating fast (palpitations).  You get dizzy or light-headed when you stand up. Get help right away if:  You have trouble breathing.  There is a change in mental status, such as becoming less alert or not being able to focus.  You have chest pain or discomfort.  You faint. This information is not intended to replace advice given to you by your health care provider. Make sure you discuss any questions you have with your health care provider. Document Released: 05/08/2008 Document Revised: 01/05/2016 Document Reviewed: 09/15/2012 Elsevier Interactive Patient Education  2017 [employer].

## 2019-01-09 ENCOUNTER — Ambulatory Visit
Admission: RE | Admit: 2019-01-09 | Discharge: 2019-01-09 | Disposition: A | Discharge status: Home / Self Care | Payer: 59 | Source: Ambulatory Visit | Attending: Physician Assistant | Admitting: Physician Assistant

## 2019-01-09 DIAGNOSIS — R062 Wheezing: Secondary | ICD-10-CM

## 2019-01-09 LAB — LIPID PANEL
Cholesterol: 209 mg/dL — ABNORMAL HIGH (ref <200)
HDL: 42 mg/dL — ABNORMAL LOW (ref >=50)
LDL Cholesterol (Calc): 127 mg/dL (calc) — ABNORMAL HIGH
Non-HDL Cholesterol (Calc): 167 mg/dL (calc) — ABNORMAL HIGH (ref <130)
Total CHOL/HDL Ratio: 5 (calc) — ABNORMAL HIGH (ref <5.0)
Triglycerides: 262 mg/dL — ABNORMAL HIGH (ref <150)

## 2019-01-09 LAB — COMPLETE METABOLIC PANEL WITH GFR
AG Ratio: 1.6 (calc) (ref 1.0–2.5)
ALT: 37 U/L — ABNORMAL HIGH (ref 6–29)
AST: 28 U/L (ref 10–35)
Albumin: 4.1 g/dL (ref 3.6–5.1)
Alkaline phosphatase (APISO): 79 U/L (ref 37–153)
BUN: 18 mg/dL (ref 7–25)
CO2: 21 mmol/L (ref 20–32)
Calcium: 9.5 mg/dL (ref 8.6–10.4)
Chloride: 105 mmol/L (ref 98–110)
Creat: 1 mg/dL (ref 0.50–1.05)
GFR, Est African American: 73 mL/min/{1.73_m2} (ref >=60)
GFR, Est Non African American: 63 mL/min/{1.73_m2} (ref >=60)
Globulin: 2.6 g/dL (calc) (ref 1.9–3.7)
Glucose, Bld: 97 mg/dL (ref 65–99)
Potassium: 4.1 mmol/L (ref 3.5–5.3)
Sodium: 139 mmol/L (ref 135–146)
Total Bilirubin: 0.2 mg/dL (ref 0.2–1.2)
Total Protein: 6.7 g/dL (ref 6.1–8.1)

## 2019-01-09 LAB — CBC WITH DIFFERENTIAL/PLATELET
Absolute Monocytes: 641 cells/uL (ref 200–950)
Basophils Absolute: 31 cells/uL (ref 0–200)
Basophils Relative: 0.5 %
Eosinophils Absolute: 31 cells/uL (ref 15–500)
Eosinophils Relative: 0.5 %
HCT: 35.9 % (ref 35.0–45.0)
Hemoglobin: 12.2 g/dL (ref 11.7–15.5)
Lymphs Abs: 2025 cells/uL (ref 850–3900)
MCH: 33.9 pg — ABNORMAL HIGH (ref 27.0–33.0)
MCHC: 34 g/dL (ref 32.0–36.0)
MCV: 99.7 fL (ref 80.0–100.0)
MPV: 11.9 fL (ref 7.5–12.5)
Monocytes Relative: 10.5 %
Neutro Abs: 3373 cells/uL (ref 1500–7800)
Neutrophils Relative %: 55.3 %
Platelets: 195 10*3/uL (ref 140–400)
RBC: 3.6 10*6/uL — ABNORMAL LOW (ref 3.80–5.10)
RDW: 11.9 % (ref 11.0–15.0)
Total Lymphocyte: 33.2 %
WBC: 6.1 10*3/uL (ref 3.8–10.8)

## 2019-01-09 LAB — VITAMIN D 25 HYDROXY (VIT D DEFICIENCY, FRACTURES): Vit D, 25-Hydroxy: 16 ng/mL — ABNORMAL LOW (ref 30–100)

## 2019-01-09 LAB — HEMOGLOBIN A1C
Hgb A1c MFr Bld: 5.7 % of total Hgb — ABNORMAL HIGH (ref <5.7)
Mean Plasma Glucose: 117 (calc)
eAG (mmol/L): 6.5 (calc)

## 2019-01-09 LAB — TSH: TSH: 9.14 mIU/L — ABNORMAL HIGH (ref 0.40–4.50)

## 2019-01-09 LAB — MAGNESIUM: Magnesium: 2 mg/dL (ref 1.5–2.5)

## 2019-01-09 LAB — BRAIN NATRIURETIC PEPTIDE: Brain Natriuretic Peptide: 38 pg/mL (ref <100)

## 2019-01-14 ENCOUNTER — Telehealth: Payer: Self-pay | Admitting: Physician Assistant

## 2019-01-14 MED ORDER — FUROSEMIDE 40 MG PO TABS: 40.0000 mg | ORAL_TABLET | Freq: Every day | ORAL | 11 refills | Status: DC

## 2019-01-14 NOTE — Telephone Encounter (Signed)
APPT scheduled..Message viewed via [place]. June 3rd 2020 @ 4:39pm

## 2019-01-14 NOTE — Telephone Encounter (Signed)
  I'm going to give you Lasix This is a fluid pill that makes you pee more often, take it in the morning  If can make you pee more for about 6 hours If can deplete your magnesium and potassium so if you get leg cramps let me know and we may need to replace this Monitor your weight and blood pressure while on it If you get dizzy while on it stop the medication and call me Any questions or concerns stop the medication and call the office.    Suggest follow up OV 1-2 weeks Suggest follow up with cardiology prior to weight loss surgery for possible echo

## 2019-01-14 NOTE — Telephone Encounter (Signed)
-----   Message from Elenor Quinones, [place] sent at 01/14/2019 11:34 AM EDT ----- Regarding: FLUID PILL Contact: [phone] FLUID hasn't gotten any worse but it hasn't gone down either. Will you send in something to help with that.   [facility]

## 2019-01-19 ENCOUNTER — Ambulatory Visit (HOSPITAL_COMMUNITY)
Admission: RE | Admit: 2019-01-19 | Discharge: 2019-01-19 | Disposition: A | Discharge status: Home / Self Care | Payer: 59 | Source: Ambulatory Visit | Attending: General Surgery | Admitting: General Surgery

## 2019-01-19 ENCOUNTER — Other Ambulatory Visit: Payer: Self-pay

## 2019-01-19 ENCOUNTER — Other Ambulatory Visit (HOSPITAL_COMMUNITY): Payer: Self-pay | Admitting: General Surgery

## 2019-01-28 ENCOUNTER — Encounter: Payer: Self-pay | Admitting: Adult Health Nurse Practitioner

## 2019-01-28 ENCOUNTER — Ambulatory Visit: Payer: 59 | Admitting: Physician Assistant

## 2019-01-28 ENCOUNTER — Ambulatory Visit: Payer: 59 | Admitting: Adult Health Nurse Practitioner

## 2019-01-28 ENCOUNTER — Other Ambulatory Visit: Payer: Self-pay

## 2019-01-28 VITALS — BP 132/74 | HR 81 | Temp 97.6°F | Ht 65.0 in | Wt 272.0 lb

## 2019-01-28 DIAGNOSIS — R600 Localized edema: Secondary | ICD-10-CM

## 2019-01-28 DIAGNOSIS — I10 Essential (primary) hypertension: Secondary | ICD-10-CM | POA: Diagnosis not present

## 2019-01-28 DIAGNOSIS — J449 Chronic obstructive pulmonary disease, unspecified: Secondary | ICD-10-CM | POA: Diagnosis not present

## 2019-01-28 LAB — COMPLETE METABOLIC PANEL WITH GFR
AG Ratio: 1.7 (calc) (ref 1.0–2.5)
ALT: 32 U/L — ABNORMAL HIGH (ref 6–29)
AST: 26 U/L (ref 10–35)
Albumin: 4 g/dL (ref 3.6–5.1)
Alkaline phosphatase (APISO): 76 U/L (ref 37–153)
BUN: 19 mg/dL (ref 7–25)
CO2: 30 mmol/L (ref 20–32)
Calcium: 9.5 mg/dL (ref 8.6–10.4)
Chloride: 103 mmol/L (ref 98–110)
Creat: 0.89 mg/dL (ref 0.50–1.05)
GFR, Est African American: 84 mL/min/{1.73_m2} (ref >=60)
GFR, Est Non African American: 72 mL/min/{1.73_m2} (ref >=60)
Globulin: 2.3 g/dL (calc) (ref 1.9–3.7)
Glucose, Bld: 112 mg/dL — ABNORMAL HIGH (ref 65–99)
Potassium: 4 mmol/L (ref 3.5–5.3)
Sodium: 141 mmol/L (ref 135–146)
Total Bilirubin: 0.4 mg/dL (ref 0.2–1.2)
Total Protein: 6.3 g/dL (ref 6.1–8.1)

## 2019-01-28 MED ORDER — FUROSEMIDE 40 MG PO TABS: 40.0000 mg | ORAL_TABLET | Freq: Every day | ORAL | 3 refills | Status: DC

## 2019-01-28 NOTE — Progress Notes (Signed)
Assessment and Plan:  Everleigh was seen today for follow-up.  Diagnoses and all orders for this visit:  Bilateral lower extremity edema Improved, continue with benefit Will check labs today -     furosemide (LASIX) 40 MG tablet; Take 1 tablet (40 mg total) by mouth daily. -     COMPLETE METABOLIC PANEL WITH GFR Discussed compression socks, OTC Discussed dietary and exercise modifications  Essential hypertension Doing well - continue medications, DASH diet, exercise and monitor at home. Call if greater than 130/80.  Chronic obstructive pulmonary disease, unspecified COPD type ([place]) Doing well at this time No current medications Monitor and avoid triggers   Further disposition pending results of labs. Discussed med's effects and SE's.   Over 20 minutes of interview, exam, counseling, chart review, and critical decision making was performed.   Future Appointments  Date Time Provider [place]  01/28/2019  9:00 AM Garnet Sierras, NP [name] None  05/13/2019  3:30 PM Vicie Mutters, PA-C [name] None  09/01/2019  3:00 PM Vicie Mutters, PA-C [name] None    ------------------------------------------------------------------------------------------------------------------   HPI 58 y.o.female presents for follow up on new medication start.  She was started on lasix two weeks ago related to BLE edema.  She denies any side effects from the medication.  Although she has had an increase in thirst and has been drinking more water during the day.  She reports that her edema has improved and her shoes fit better.  She reports she is going to the bathroom more often and this decreases as the day progresses.  She denies waking from sleep.  She is taking B/P at home twice a day and this ranges ranges from 120's-140's over 70's/80's.  She denies any fatigue, cramping, dizziness chest pains or shortness of breath.  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  Reports she has a hard time putting on compressing stockings, she has tried in the past, related to the arthritis in her hands.  We discussed the use of OTC compression socks while she is working may also help with chronic edema.  She has completed the assessment for bariatric surgery, sleeve.  She is awaiting for approval from her insurance for this.    Past Medical History:  Diagnosis Date  . Allergy   . Arthritis    "knees, hands, ankles, ~ every joint" (04/08/2015)  . Basal cell carcinoma    "face, hands, chest" (04/08/2015)  . Chronic bronchitis ([place])    "get it pretty much q yr" (04/08/2015)  . Dysrhythmia    palpitations occ; SVT 09/2013 s/p adenosine  . GERD (gastroesophageal reflux disease)   . H/O hiatal hernia   . Hx of cardiovascular stress test    Lexiscan Myoview 6/16: Ejection fraction is 60% and wall motion is normal. The study is normal. There is no scar or ischemia. This is a low risk scan.  . Hyperlipidemia   . Hypertension   . Hypothyroidism   . Palpitations   . Pneumonia 2015 X 1  . PONV (postoperative nausea and vomiting)   . S/P total knee arthroplasty 02/15/2014  . Thyroid disease   . Vitamin D deficiency      Allergies  Allergen Reactions  . Darvocet [Propoxyphene N-Acetaminophen] Anaphylaxis  . Darvon [Propoxyphene Hcl] Anaphylaxis and Other (See Comments)    Airway swelling   . Propoxyphene Anaphylaxis  Throat closes up  . Doxycycline Nausea Only  . Tamiflu [Oseltamivir Phosphate] Hives and Other (See Comments)    Increased blood pressure    Current Outpatient Medications on File Prior to Visit  Medication Sig  . acetaminophen (TYLENOL) 650 MG CR tablet Take 1,300 mg by mouth 2 (two) times daily as needed for pain.  . bisoprolol-hydrochlorothiazide (ZIAC) 5-6.25 MG tablet Take 1 tablet by mouth daily.  . Cholecalciferol (VITAMIN D3) 10000  units capsule Take 10,000 Units by mouth daily.  . cyanocobalamin (,VITAMIN B-12,) 1000 MCG/ML injection Inject 1 mL (1,000 mcg total) into the skin every 30 (thirty) days.  . diphenhydrAMINE (BENADRYL) 25 MG tablet Take 25 mg by mouth daily.   Marland Kitchen escitalopram (LEXAPRO) 20 MG tablet TAKE 1 TABLET BY MOUTH EVERY DAY  . furosemide (LASIX) 40 MG tablet Take 1 tablet (40 mg total) by mouth daily.  Marland Kitchen leflunomide (ARAVA) 20 MG tablet Take 20 mg by mouth daily.  Marland Kitchen levothyroxine (SYNTHROID) 125 MCG tablet TAKE 1 TABLET BY MOUTH EVERY DAY  . levothyroxine (SYNTHROID, LEVOTHROID) 137 MCG tablet Take one tablet by mouth every morning on empty stomach with water, 69min prior to eating.  Wait 4 hours for antacids.  . NON FORMULARY Apply 1 application topically at bedtime. Dermatologist Office Cream for Rosacea  . olmesartan (BENICAR) 40 MG tablet Take 1 tablet daily for BP  . pantoprazole (PROTONIX) 20 MG tablet Take 1 tablet (20 mg total) by mouth daily.  . ranitidine (ZANTAC) 75 MG tablet Take 75 mg by mouth daily as needed for heartburn.  . Tofacitinib Citrate ER 11 MG TB24 Take 11 mg by mouth daily.  Marland Kitchen topiramate (TOPAMAX) 50 MG tablet TAKE 1 TABLET BY MOUTH EVERYDAY AT BEDTIME  . VOLTAREN 1 % GEL Apply 2 g topically 4 (four) times daily as needed (joint pain).   No current facility-administered medications on file prior to visit.     ROS: all negative except above.   Physical Exam:  There were no vitals taken for this visit.  General Appearance: Well nourished, in no apparent distress. Eyes: PERRLA, EOMs, conjunctiva no swelling or erythema Sinuses: No Frontal/maxillary tenderness ENT/Mouth: Ext aud canals clear, TMs without erythema, bulging. No erythema, swelling, or exudate on post pharynx.  Tonsils not swollen or erythematous. Hearing normal.  Neck: Supple, thyroid normal.  Respiratory: Respiratory effort normal, BS equal bilaterally without rales, rhonchi, wheezing or stridor.  Cardio: RRR  with no MRGs. Brisk peripheral pulses without pitting edema. Abdomen: Soft, + BS.  Non tender, no guarding, rebound, hernias, masses. Lymphatics: Non tender without lymphadenopathy.  Musculoskeletal: Full ROM, 5/5 strength, normal gait.  Skin: Warm, dry without rashes, lesions, ecchymosis.  Neuro: Cranial nerves intact. Normal muscle tone, no cerebellar symptoms. Sensation intact.  Psych: Awake and oriented X 3, normal affect, Insight and Judgment appropriate.     Garnet Sierras, NP 12:24 AM [facility]

## 2019-01-28 NOTE — Patient Instructions (Addendum)
Continue Lasix 40mg  daily in the morning Continue to drink at least 80oz of water or more a day.  Below is general information about low potassium.  This could be a side effects of the Lasix that we will monitor.  We are going to check your potassium level today with your labs.   Hypokalemia Hypokalemia means that the amount of potassium in the blood is lower than normal.Potassium is a chemical that helps regulate the amount of fluid in the body (electrolyte). It also stimulates muscle tightening (contraction) and helps nerves work properly.Normally, most of the body's potassium is inside of cells, and only a very small amount is in the blood. Because the amount in the blood is so small, minor changes to potassium levels in the blood can be life-threatening. What are the causes? This condition may be caused by:  Antibiotic medicine.  Diarrhea or vomiting. Taking too much of a medicine that helps you have a bowel movement (laxative) can cause diarrhea and lead to hypokalemia.  Chronic kidney disease (CKD).  Medicines that help the body get rid of excess fluid (diuretics).  Eating disorders, such as bulimia.  Low magnesium levels in the body.  Sweating a lot. What are the signs or symptoms? Symptoms of this condition include:  Weakness.  Constipation.  Fatigue.  Muscle cramps.  Mental confusion.  Skipped heartbeats or irregular heartbeat (palpitations).  Tingling or numbness. How is this diagnosed? This condition is diagnosed with a blood test. How is this treated? Hypokalemia can be treated by taking potassium supplements by mouth or adjusting the medicines that you take. Treatment may also include eating more foods that contain a lot of potassium. If your potassium level is very low, you may need to get potassium through an IV tube in one of your veins and be monitored in the hospital. Follow these instructions at home:   Take over-the-counter and prescription  medicines only as told by your health care provider. This includes vitamins and supplements.  Eat a healthy diet. A healthy diet includes fresh fruits and vegetables, whole grains, healthy fats, and lean proteins.  If instructed, eat more foods that contain a lot of potassium, such as: ? Nuts, such as peanuts and pistachios. ? Seeds, such as sunflower seeds and pumpkin seeds. ? Peas, lentils, and lima beans. ? Whole grain and bran cereals and breads. ? Fresh fruits and vegetables, such as apricots, avocado, bananas, cantaloupe, kiwi, oranges, tomatoes, asparagus, and potatoes. ? Orange juice. ? Tomato juice. ? Red meats. ? Yogurt.  Keep all follow-up visits as told by your health care provider. This is important. Contact a health care provider if:  You have weakness that gets worse.  You feel your heart pounding or racing.  You vomit.  You have diarrhea.  You have diabetes (diabetes mellitus) and you have trouble keeping your blood sugar (glucose) in your target range. Get help right away if:  You have chest pain.  You have shortness of breath.  You have vomiting or diarrhea that lasts for more than 2 days.  You faint. This information is not intended to replace advice given to you by your health care provider. Make sure you discuss any questions you have with your health care provider. Document Released: 07/30/2005 Document Revised: 03/17/2016 Document Reviewed: 03/17/2016 Elsevier Interactive Patient Education  2019 [employer].

## 2019-02-01 ENCOUNTER — Other Ambulatory Visit: Payer: Self-pay | Admitting: Physician Assistant

## 2019-02-09 ENCOUNTER — Other Ambulatory Visit: Payer: Self-pay | Admitting: Internal Medicine

## 2019-02-09 ENCOUNTER — Other Ambulatory Visit: Payer: Self-pay | Admitting: Physician Assistant

## 2019-03-19 ENCOUNTER — Other Ambulatory Visit: Payer: Self-pay | Admitting: Physician Assistant

## 2019-03-19 MED ORDER — FLUCONAZOLE 150 MG PO TABS: 150.0000 mg | ORAL_TABLET | Freq: Every day | ORAL | 3 refills | Status: DC

## 2019-03-19 MED ORDER — NYSTATIN 100000 UNIT/ML MT SUSP: OROMUCOSAL | 0 refills | Status: DC

## 2019-03-20 ENCOUNTER — Other Ambulatory Visit: Payer: Self-pay

## 2019-03-20 ENCOUNTER — Institutional Professional Consult (permissible substitution): Payer: 59 | Admitting: Internal Medicine

## 2019-03-20 ENCOUNTER — Encounter: Payer: Self-pay | Admitting: Cardiology

## 2019-03-20 ENCOUNTER — Ambulatory Visit: Payer: 59 | Admitting: Cardiology

## 2019-03-20 VITALS — BP 130/93 | HR 65 | Ht 65.0 in | Wt 271.4 lb

## 2019-03-20 DIAGNOSIS — R0609 Other forms of dyspnea: Secondary | ICD-10-CM

## 2019-03-20 DIAGNOSIS — I700 Atherosclerosis of aorta: Secondary | ICD-10-CM

## 2019-03-20 DIAGNOSIS — E782 Mixed hyperlipidemia: Secondary | ICD-10-CM

## 2019-03-20 DIAGNOSIS — Z01810 Encounter for preprocedural cardiovascular examination: Secondary | ICD-10-CM

## 2019-03-20 DIAGNOSIS — I493 Ventricular premature depolarization: Secondary | ICD-10-CM

## 2019-03-20 DIAGNOSIS — R0600 Dyspnea, unspecified: Secondary | ICD-10-CM

## 2019-03-20 DIAGNOSIS — R739 Hyperglycemia, unspecified: Secondary | ICD-10-CM

## 2019-03-20 DIAGNOSIS — Z6841 Body Mass Index (BMI) 40.0 and over, adult: Secondary | ICD-10-CM

## 2019-03-20 DIAGNOSIS — I10 Essential (primary) hypertension: Secondary | ICD-10-CM

## 2019-03-20 NOTE — Progress Notes (Signed)
Primary Physician:  Unk Pinto, MD   Patient ID: Sherry Dennis, female    DOB: Sep 20, 1960, 58 y.o.   MRN: [id number]  Subjective:    Chief Complaint  Patient presents with  . Hypertension  . Tachycardia  . New Patient (Initial Visit)    HPI: LATERA MCLIN  is a 58 y.o. female  with history of tobacco use disorder,hypertension, hyperlipidemia, morbid obesity, referred to me for preoperative stratification in view of abnormal EKG.  She has chronic dyspnea, but denies PND or orthopnea. Her activity has been limited due to dyspnea but in general she is a relatively sedentary lifestyle but does manage properties and states that she walks around the property without any chest pain.  She has diagnosis of PSVT and is undergone ablation On 04/08/2015.  She has not had any recurrence of significant palpitations since then. She has chronic bilateral leg edema for which she takes furosemide.  Past Medical History:  Diagnosis Date  . Allergy   . Arthritis    "knees, hands, ankles, ~ every joint" (04/08/2015)  . Basal cell carcinoma    "face, hands, chest" (04/08/2015)  . Chronic bronchitis ([place])    "get it pretty much q yr" (04/08/2015)  . Dysrhythmia    palpitations occ; SVT 09/2013 s/p adenosine  . GERD (gastroesophageal reflux disease)   . H/O hiatal hernia   . Hx of cardiovascular stress test    Lexiscan Myoview 6/16: Ejection fraction is 60% and wall motion is normal. The study is normal. There is no scar or ischemia. This is a low risk scan.  . Hyperlipidemia   . Hypertension   . Hypothyroidism   . Palpitations   . Pneumonia 2015 X 1  . PONV (postoperative nausea and vomiting)   . S/P total knee arthroplasty 02/15/2014  . Thyroid disease   . Vitamin D deficiency     Past Surgical History:  Procedure Laterality Date  . BASAL CELL CARCINOMA EXCISION  2014   "off my chest"  . BLADDER REPAIR  1993   "lasered the holes shut"  . CARDIAC SURGERY    . CARPAL TUNNEL RELEASE Right  1990  . COLONOSCOPY    . COLONOSCOPY WITH PROPOFOL N/A 11/25/2017   Procedure: COLONOSCOPY WITH PROPOFOL;  Surgeon: Mauri Pole, MD;  Location: [facility];  Service: Endoscopy;  Laterality: N/A;  . ELECTROPHYSIOLOGIC STUDY N/A 04/08/2015   Procedure: SVT Ablation;  Surgeon: Evans Lance, MD;  Location: [facility];  Service: Cardiovascular;  Laterality: N/A;  . ENDOMETRIAL ABLATION  2009  . INCONTINENCE SURGERY  2010  . JOINT REPLACEMENT    . KNEE ARTHROSCOPY Right 2013  . KNEE ARTHROSCOPY    . PARTIAL KNEE ARTHROPLASTY Right 02/15/2014   Procedure: RIGHT UNICOMPARTMENTAL KNEE (MEDIAL COMPARTMENT);  Surgeon: Vickey Huger, MD;  Location: [facility];  Service: Orthopedics;  Laterality: Right;  . POLYPECTOMY    . TUBAL LIGATION  1987    Social History   Socioeconomic History  . Marital status: Divorced    Spouse name: Not on file  . Number of children: 2  . Years of education: Not on file  . Highest education level: Not on file  Occupational History  . Not on file  Social Needs  . Financial resource strain: Not on file  . Food insecurity    Worry: Not on file    Inability: Not on file  . Transportation needs    Medical: Not on file  Non-medical: Not on file  Tobacco Use  . Smoking status: Former Smoker    Packs/day: 1.50    Years: 40.00    Pack years: 60.00    Types: Cigarettes    Quit date: 02/2019    Years since quitting: 0.1  . Smokeless tobacco: Never Used  . Tobacco comment: 04/08/2015 "went from ~ 2 ppd to 3 cigarettes in 1 week"  Substance and Sexual Activity  . Alcohol use: No    Alcohol/week: 0.0 standard drinks    Comment: 04/08/2015 "couple drinks/year"  . Drug use: No  . Sexual activity: Not Currently  Lifestyle  . Physical activity    Days per week: Not on file    Minutes per session: Not on file  . Stress: Not on file  Relationships  . Social Herbalist on phone: Not on file    Gets together: Not on file    Attends religious  service: Not on file    Active member of club or organization: Not on file    Attends meetings of clubs or organizations: Not on file    Relationship status: Not on file  . Intimate partner violence    Fear of current or ex partner: Not on file    Emotionally abused: Not on file    Physically abused: Not on file    Forced sexual activity: Not on file  Other Topics Concern  . Not on file  Social History Narrative  . Not on file    Review of Systems  Constitution: Negative for chills, decreased appetite, malaise/fatigue and weight gain.  Cardiovascular: Positive for dyspnea on exertion. Negative for leg swelling and syncope.  Endocrine: Negative for cold intolerance.  Hematologic/Lymphatic: Does not bruise/bleed easily.  Musculoskeletal: Positive for joint pain. Negative for joint swelling.  Gastrointestinal: Negative for abdominal pain, anorexia, change in bowel habit, hematochezia and melena.  Neurological: Negative for headaches and light-headedness.  Psychiatric/Behavioral: Negative for depression and substance abuse.  All other systems reviewed and are negative.     Objective:  Blood pressure (!) 130/93, pulse 65, height 5\' 5"  (1.651 m), weight 271 lb 6.4 oz (123.1 kg), SpO2 97 %. Body mass index is 45.16 kg/m.    Physical Exam  Constitutional: She appears well-developed. No distress.  Morbidly obese  HENT:  Head: Atraumatic.  Eyes: Conjunctivae are normal.  Neck: Neck supple. No thyromegaly present.  Short neck and difficult to evaluate JVP  Cardiovascular: Normal rate, regular rhythm and normal heart sounds.  Occasional extrasystoles are present. Exam reveals no gallop.  No murmur heard. Pulses:      Carotid pulses are 2+ on the right side and 2+ on the left side.      Dorsalis pedis pulses are 2+ on the right side and 2+ on the left side.       Posterior tibial pulses are 2+ on the right side and 2+ on the left side.  Femoral and popliteal pulse difficult to feel  due to patient's body habitus.  Trace bilateral leg edema, no JVD.  Pulmonary/Chest: Effort normal and breath sounds normal.  Abdominal: Soft. Bowel sounds are normal.  Obese. Pannus present  Musculoskeletal: Normal range of motion.        General: No edema.  Neurological: She is alert.  Skin: Skin is warm and dry.  Psychiatric: She has a normal mood and affect.   Radiology: No results found.  Laboratory examination:    CMP Latest Ref Rng & Units  01/28/2019 01/08/2019 09/22/2018  Glucose 65 - 99 mg/dL 112(H) 97 97  BUN 7 - 25 mg/dL 19 18 16   Creatinine 0.50 - 1.05 mg/dL 0.89 1.00 0.85  Sodium 135 - 146 mmol/L 141 139 143  Potassium 3.5 - 5.3 mmol/L 4.0 4.1 4.6  Chloride 98 - 110 mmol/L 103 105 108  CO2 20 - 32 mmol/L 30 21 26   Calcium 8.6 - 10.4 mg/dL 9.5 9.5 9.5  Total Protein 6.1 - 8.1 g/dL 6.3 6.7 6.6  Total Bilirubin 0.2 - 1.2 mg/dL 0.4 0.2 0.2  Alkaline Phos 38 - 126 U/L - - -  AST 10 - 35 U/L 26 28 20   ALT 6 - 29 U/L 32(H) 37(H) 31(H)   CBC Latest Ref Rng & Units 01/08/2019 08/25/2018 08/12/2018  WBC 3.8 - 10.8 Thousand/uL 6.1 7.2 7.7  Hemoglobin 11.7 - 15.5 g/dL 12.2 12.4 13.1  Hematocrit 35.0 - 45.0 % 35.9 36.7 38.5  Platelets 140 - 400 Thousand/uL 195 201 204   Lipid Panel     Component Value Date/Time   CHOL 209 (H) 01/08/2019 1711   TRIG 262 (H) 01/08/2019 1711   HDL 42 (L) 01/08/2019 1711   CHOLHDL 5.0 (H) 01/08/2019 1711   VLDL 55 (H) 01/28/2017 1457   LDLCALC 127 (H) 01/08/2019 1711   HEMOGLOBIN A1C Lab Results  Component Value Date   HGBA1C 5.7 (H) 01/08/2019   MPG 117 01/08/2019   TSH Recent Labs    05/13/18 1603 08/12/18 0930 01/08/19 1711  TSH 0.29* 9.14* 9.14*    PRN Meds:. Medications Discontinued During This Encounter  Medication Reason  . escitalopram (LEXAPRO) 20 MG tablet Error  . levothyroxine (SYNTHROID) 125 MCG tablet Error  . olmesartan (BENICAR) 40 MG tablet Error  . Tofacitinib Citrate ER 11 MG TB24 Error  . topiramate  (TOPAMAX) 50 MG tablet Error   Current Meds  Medication Sig  . acetaminophen (TYLENOL) 650 MG CR tablet Take 1,300 mg by mouth 2 (two) times daily as needed for pain.  . bisoprolol-hydrochlorothiazide (ZIAC) 5-6.25 MG tablet Take 1 tablet by mouth daily.  . Cholecalciferol (VITAMIN D3) 10000 units capsule Take 10,000 Units by mouth. Sometimes  . cyanocobalamin (,VITAMIN B-12,) 1000 MCG/ML injection Inject 1 mL (1,000 mcg total) into the skin every 30 (thirty) days.  . diphenhydrAMINE (BENADRYL) 25 MG tablet Take 25 mg by mouth daily.   . fluconazole (DIFLUCAN) 150 MG tablet Take 1 tablet (150 mg total) by mouth daily.  . furosemide (LASIX) 40 MG tablet Take 1 tablet (40 mg total) by mouth daily.  Marland Kitchen leflunomide (ARAVA) 20 MG tablet Take 20 mg by mouth daily.  Marland Kitchen levothyroxine (SYNTHROID, LEVOTHROID) 137 MCG tablet Take one tablet by mouth every morning on empty stomach with water, 35min prior to eating.  Wait 4 hours for antacids.  Marland Kitchen morphine (MSIR) 15 MG tablet Take 15 mg by mouth 3 (three) times daily as needed.  . NON FORMULARY Apply 1 application topically at bedtime. Dermatologist Office Cream for Rosacea  . nystatin (MYCOSTATIN) 100000 UNIT/ML suspension 5 ml four times a day, retain in mouth as long as possible (Swish and Spit).  Use for 48 hours after symptoms resolve.  . olmesartan (BENICAR) 20 MG tablet TAKE 2 TABLETS (40MG ) BY MOUTH DAILY  . pantoprazole (PROTONIX) 20 MG tablet Take 1 tablet Daily for Indigestion & Heartburn  . ranitidine (ZANTAC) 75 MG tablet Take 75 mg by mouth daily as needed for heartburn.  . VOLTAREN 1 % GEL Apply  2 g topically 4 (four) times daily as needed (joint pain).  . [DISCONTINUED] levothyroxine (SYNTHROID) 125 MCG tablet TAKE 1 TABLET BY MOUTH EVERY DAY  . [DISCONTINUED] olmesartan (BENICAR) 40 MG tablet Take 1 tablet daily for BP    Cardiac Studies:    Lexiscan sestamibi stress to 01/19/2015: Ejection fraction is 60% and wall motion is normal. The  study is normal. There is no scar or ischemia. This is a low risk scan.       PSVT ablation 0/26/2016.  Assessment:     ICD-10-CM   1. Dyspnea on exertion  R06.09 EKG 12-Lead    PCV MYOCARDIAL PERFUSION WITH LEXISCAN    PCV ECHOCARDIOGRAM COMPLETE  2. Frequent PVCs  I49.3 PCV MYOCARDIAL PERFUSION WITH LEXISCAN    PCV ECHOCARDIOGRAM COMPLETE  3. Preoperative cardiovascular examination  Z01.810 PCV MYOCARDIAL PERFUSION WITH LEXISCAN    PCV ECHOCARDIOGRAM COMPLETE   Gastric sleeve 04/06/2019: Greer Pickerel, MD  4. Essential hypertension  I10   5. Class 3 severe obesity due to excess calories without serious comorbidity with body mass index (BMI) of 40.0 to 44.9 in adult (HCC)  E66.01    Z68.41   6. Aortic atherosclerosis (HCC)  I70.0    By CT abdomen  7. Mixed hyperlipidemia  E78.2   8. Hyperglycemia  R73.9     EKG 03/19/22: Normal sinus rhythm at the rate of 56 bpm, left atrial abnormality, left axis deviation, left anterior fascicular block.  T wave inversion, cannot exclude inferior and anteroseptal ischemia.  Frequent PVCs.  Compared to 08/11/2018, no significant change in axis deviation or T-wave abnormality.  PVCs new.  Recommendations:   Patient with hypertension, hyperlipidemia, mixed variety, aortic atherosclerosis by CT scan the abdomen, frequent PVCs and abnormal EKG referred for preoperative cardiac risk stratification.  EKG abnormalities new compared to previous.  In view of abnormal risk factors, I have recommended echocardiogram and also Lexiscan Myoview stress test.  Patient unable to exercise due to back pain and arthritis and dyspnea.  I would highly recommend patient be on a statin.  She is been scheduled for bariatric surgery which will certainly improve her hyperglycemia, lipid status as well.  However she has recently quit smoking and in view of abdominal aortic atherosclerosis, she should be on at least 10 mg of Lipitor, patient on the stress test and echocardiogram  and abnormal would like to follow-up with her PCP. She also needs to be f/u on her TSH.   Her blood pressure is elevated today, patient states her blood pressure has been well-controlled.  I did not make any changes to her medications.  With regard to cardiac risk stratification, if the echocardiogram reveals normal LVEF and nuclear stress test, today protocol does not show significant ischemia, I will send my letter to the surgeon Dr. Greer Pickerel.   Adrian Prows, MD, [facility] 03/22/2019, 5:50 PM [facility] [place] Pager: [phone] Office: [phone] If no answer Cell [phone]

## 2019-03-22 ENCOUNTER — Encounter: Payer: Self-pay | Admitting: Cardiology

## 2019-03-23 ENCOUNTER — Other Ambulatory Visit: Payer: Self-pay

## 2019-03-23 ENCOUNTER — Ambulatory Visit (INDEPENDENT_AMBULATORY_CARE_PROVIDER_SITE_OTHER): Payer: 59

## 2019-03-23 DIAGNOSIS — R0609 Other forms of dyspnea: Secondary | ICD-10-CM

## 2019-03-23 DIAGNOSIS — R0600 Dyspnea, unspecified: Secondary | ICD-10-CM

## 2019-03-23 DIAGNOSIS — I493 Ventricular premature depolarization: Secondary | ICD-10-CM

## 2019-03-23 DIAGNOSIS — Z01810 Encounter for preprocedural cardiovascular examination: Secondary | ICD-10-CM

## 2019-03-24 ENCOUNTER — Encounter: Payer: Self-pay | Admitting: Internal Medicine

## 2019-03-24 ENCOUNTER — Ambulatory Visit: Payer: 59 | Admitting: Internal Medicine

## 2019-03-24 DIAGNOSIS — Z9989 Dependence on other enabling machines and devices: Secondary | ICD-10-CM

## 2019-03-24 DIAGNOSIS — J449 Chronic obstructive pulmonary disease, unspecified: Secondary | ICD-10-CM

## 2019-03-24 DIAGNOSIS — Z87891 Personal history of nicotine dependence: Secondary | ICD-10-CM

## 2019-03-24 DIAGNOSIS — G4733 Obstructive sleep apnea (adult) (pediatric): Secondary | ICD-10-CM

## 2019-03-24 NOTE — Patient Instructions (Signed)
You are cleared for surgery but take your cpap with you a maximize mobilization post op and minimize pain medications.  Your PCP can start you on the low dose screening CT scan for which you are eligible x 15 years  but you would not need to start until after the first of Jan 2021 per Eric Form NP  if you want to use our program

## 2019-03-24 NOTE — Progress Notes (Signed)
Sherry Dennis, female    DOB: 1960-08-17,     MRN: [id number]   Brief patient profile:  55 yowf quit smoking 02/2019  With baseline p IUPs wt = 125 lb states due to  hypothyroidism progressive wt gain to 270 llb and limited more by back than breathing but needing clearance for gastric sleave by Dr Greer Pickerel     History of Present Illness  03/24/2019  Pulmonary/ 1st office eval/Laren Orama  Chief Complaint  Patient presents with  . Pulmonary Consult    Referred by Dr. Greer Pickerel. Needs pulmonary clearance for gastric sleeve surgery. Pt has OSA and is on CPAP.   Dyspnea:  Not really limited by breathing but by back, knees also climb her from climbing steps > doe  Cough: none  since quit smoking  Sleep: cpap x 7 months  Per Dohmeier > feels fine when awake and during the day SABA use: one    No obvious day to day or daytime variability or assoc excess/ purulent sputum or mucus plugs or hemoptysis or cp or chest tightness, subjective wheeze or overt sinus or hb symptoms.   Sleeping on cpap now  without nocturnal  or early am exacerbation  of respiratory  c/o's or need for noct saba. Also denies any obvious fluctuation of symptoms with weather or environmental changes or other aggravating or alleviating factors except as outlined above   No unusual exposure hx or h/o childhood pna/ asthma or knowledge of premature birth.  Current Allergies, Complete Past Medical History, Past Surgical History, Family History, and Social History were reviewed in [employer] record.  ROS  The following are not active complaints unless bolded Hoarseness, sore throat, dysphagia, dental problems, itching, sneezing,  nasal congestion or discharge of excess mucus or purulent secretions, ear ache,   fever, chills, sweats, unintended wt loss or wt gain, classically pleuritic or exertional cp,  orthopnea pnd or arm/hand swelling  or leg swelling, presyncope, palpitations, abdominal pain, anorexia,  nausea, vomiting, diarrhea  or change in bowel habits or change in bladder habits, change in stools or change in urine, dysuria, hematuria,  rash, arthralgias, visual complaints, headache, numbness, weakness or ataxia or problems with walking or coordination,  change in mood or  memory.           Past Medical History:  Diagnosis Date  . Allergy   . Arthritis    "knees, hands, ankles, ~ every joint" (04/08/2015)  . Basal cell carcinoma    "face, hands, chest" (04/08/2015)  . Chronic bronchitis ([place])    "get it pretty much q yr" (04/08/2015)  . Dysrhythmia    palpitations occ; SVT 09/2013 s/p adenosine  . GERD (gastroesophageal reflux disease)   . H/O hiatal hernia   . Hx of cardiovascular stress test    Lexiscan Myoview 6/16: Ejection fraction is 60% and wall motion is normal. The study is normal. There is no scar or ischemia. This is a low risk scan.  . Hyperlipidemia   . Hypertension   . Hypothyroidism   . Palpitations   . Pneumonia 2015 X 1  . PONV (postoperative nausea and vomiting)   . S/P total knee arthroplasty 02/15/2014  . Thyroid disease   . Vitamin D deficiency     Outpatient Medications Prior to Visit  Medication Sig Dispense Refill  . acetaminophen (TYLENOL) 650 MG CR tablet Take 1,300 mg by mouth 2 (two) times daily as needed for pain.    . bisoprolol-hydrochlorothiazide (  ZIAC) 5-6.25 MG tablet Take 1 tablet by mouth daily. 90 tablet 1  . Cholecalciferol (VITAMIN D3) 10000 units capsule Take 10,000 Units by mouth. Sometimes    . cyanocobalamin (,VITAMIN B-12,) 1000 MCG/ML injection Inject 1 mL (1,000 mcg total) into the skin every 30 (thirty) days. 30 mL 2  . diphenhydrAMINE (BENADRYL) 25 MG tablet Take 25 mg by mouth daily.     . furosemide (LASIX) 40 MG tablet Take 1 tablet (40 mg total) by mouth daily. 90 tablet 3  . leflunomide (ARAVA) 20 MG tablet Take 20 mg by mouth daily.    Marland Kitchen levothyroxine (SYNTHROID, LEVOTHROID) 137 MCG tablet Take one tablet by mouth every  morning on empty stomach with water, 87min prior to eating.  Wait 4 hours for antacids. 30 tablet 1  . morphine (MSIR) 15 MG tablet Take 15 mg by mouth 3 (three) times daily as needed.    . NON FORMULARY Apply 1 application topically at bedtime. Dermatologist Office Cream for Rosacea    . olmesartan (BENICAR) 20 MG tablet TAKE 2 TABLETS (40MG ) BY MOUTH DAILY 60 tablet 5  . pantoprazole (PROTONIX) 20 MG tablet Take 1 tablet Daily for Indigestion & Heartburn 90 tablet 0  . ranitidine (ZANTAC) 75 MG tablet Take 75 mg by mouth daily as needed for heartburn.    . VOLTAREN 1 % GEL Apply 2 g topically 4 (four) times daily as needed (joint pain). 100 g 2  . fluconazole (DIFLUCAN) 150 MG tablet Take 1 tablet (150 mg total) by mouth daily. 1 tablet 3  . nystatin (MYCOSTATIN) 100000 UNIT/ML suspension 5 ml four times a day, retain in mouth as long as possible (Swish and Spit).  Use for 48 hours after symptoms resolve. 80 mL 0      Objective:     BP 106/70 (BP Location: Left Arm, Cuff Size: Large)   Pulse 63   Temp 98.4 F (36.9 C) (Oral)   Ht 5\' 5"  (1.651 m)   Wt 273 lb (123.8 kg)   SpO2 98% Comment: on RA  BMI 45.43 kg/m   SpO2: 98 %(on RA)  amb obese wf nad    HEENT: nl dentition, turbinates bilaterally, and oropharynx. Nl external ear canals without cough reflex   NECK :  without JVD/Nodes/TM/ nl carotid upstrokes bilaterally   LUNGS: no acc muscle use,  Nl contour chest which is clear to A and P bilaterally without cough on insp or exp maneuvers   CV:  RRR  no s3 or murmur or increase in P2, and no edema   ABD:  Obese/ soft and nontender with nl inspiratory excursion in the supine position. No bruits or organomegaly appreciated, bowel sounds nl  MS:  Nl gait/ ext warm without deformities, calf tenderness, cyanosis or clubbing No obvious joint restrictions   SKIN: warm and dry without lesions    NEURO:  alert, approp, nl sensorium with  no motor or cerebellar deficits  apparent.      I personally reviewed images and agree with radiology impression as follows:  CXR:   01/19/2019  No active cardiopulmonary disease.  CTa 08/14/18 nl lungs         Assessment   No problem-specific Assessment & Plan notes found for this encounter.     Christinia Gully, MD 03/24/2019

## 2019-03-25 ENCOUNTER — Encounter: Payer: Self-pay | Admitting: Internal Medicine

## 2019-03-25 DIAGNOSIS — Z87891 Personal history of nicotine dependence: Secondary | ICD-10-CM | POA: Insufficient documentation

## 2019-03-25 NOTE — Assessment & Plan Note (Signed)
Quit smoking 02/2019 / never had pfts    I reviewed the Fletcher curve with the patient that basically indicates  if you quit smoking when your best day FEV1 is still well preserved (as is clearly  the case here)  it is highly unlikely you will progress to severe disease and informed the patient there was  no medication on the market that has proven to alter the curve/ its downward trajectory  or the likelihood of progression of their disease(unlike other chronic medical conditions such as atheroclerosis where we do think we can change the natural hx with risk reducing meds)    Therefore stopping smoking and maintaining abstinence are  the most important aspects of care, not choice of inhalers or for that matter, doctors.   Treatment other than smoking cessation  is entirely directed by severity of symptoms and focused also on reducing exacerbations, not attempting to change the natural history of the disease.    >> as she has no limiting doe, no emphysema on CT and no tendency to aecopd now that off cigs does not need preop pfts or any preop "tune up" or special precautions other than early mobilization/ min narcs as per rountine for MO pts.  >> pulmonary f/u is prn.

## 2019-03-25 NOTE — Assessment & Plan Note (Signed)
Body mass index is 45.43 kg/m.    Lab Results  Component Value Date   TSH 9.14 (H) 01/08/2019     Contributing to gerd risk/ doe/reviewed the need and the process to achieve and maintain neg calorie balance > defer f/u primary care including intermittently monitoring thyroid status     Total time devoted to counseling  > 50 % of initial 60 min office visit:  review case with pt/ discussion of options/alternatives/ personally creating written customized instructions  in presence of pt  then going over those specific  Instructions directly with the pt including how to use all of the meds but in particular covering each new medication in detail and the difference between the maintenance= "automatic" meds and the prns using an action plan format for the latter (If this problem/symptom => do that organization reading Left to right).  Please see AVS from this visit for a full list of these instructions which I personally wrote for this pt and  are unique to this visit.

## 2019-03-25 NOTE — Assessment & Plan Note (Signed)
CPAP per Dohmeier as of early 2020   Reminded to bring cpap equipment for post op use, possible she  Might need brief bipap bridge but this can be arranged by RT if needed and PCCM service  can see as inpt prn

## 2019-03-25 NOTE — Assessment & Plan Note (Signed)
CT chest 08/2018 neg for nodules > rec LDSCT program to start 08/2019   Emphasized maintaining off cigs is the most impt way to reduce lung ca risk.  Low-dose CT lung cancer screening is recommended for patients who are 80-56 years of age with a 30+ pack-year history of smoking, and who are currently smoking or quit <=15 years ago.   Discussed in detail all the  indications, usual  risks and alternatives  relative to the benefits with patient who wants to decide later whether to f/u  as outlined.     >>> return here or thru pcp to initiate LDSCT if desired.

## 2019-03-30 ENCOUNTER — Other Ambulatory Visit: Payer: Self-pay

## 2019-03-30 ENCOUNTER — Other Ambulatory Visit: Payer: 59

## 2019-03-30 DIAGNOSIS — I493 Ventricular premature depolarization: Secondary | ICD-10-CM

## 2019-03-30 DIAGNOSIS — Z01810 Encounter for preprocedural cardiovascular examination: Secondary | ICD-10-CM

## 2019-03-30 DIAGNOSIS — R0609 Other forms of dyspnea: Secondary | ICD-10-CM

## 2019-03-30 MED ORDER — ESCITALOPRAM OXALATE 20 MG PO TABS: 20.0000 mg | ORAL_TABLET | Freq: Every day | ORAL | 1 refills | Status: DC

## 2019-04-01 ENCOUNTER — Ambulatory Visit (INDEPENDENT_AMBULATORY_CARE_PROVIDER_SITE_OTHER): Payer: 59

## 2019-04-01 ENCOUNTER — Other Ambulatory Visit: Payer: 59

## 2019-04-01 ENCOUNTER — Other Ambulatory Visit: Payer: Self-pay

## 2019-04-01 DIAGNOSIS — Z01810 Encounter for preprocedural cardiovascular examination: Secondary | ICD-10-CM | POA: Diagnosis not present

## 2019-04-01 DIAGNOSIS — I493 Ventricular premature depolarization: Secondary | ICD-10-CM

## 2019-04-01 DIAGNOSIS — R0600 Dyspnea, unspecified: Secondary | ICD-10-CM

## 2019-04-01 DIAGNOSIS — R0609 Other forms of dyspnea: Secondary | ICD-10-CM

## 2019-04-06 ENCOUNTER — Other Ambulatory Visit: Payer: Self-pay | Admitting: Internal Medicine

## 2019-04-08 ENCOUNTER — Other Ambulatory Visit: Payer: Self-pay | Admitting: Physician Assistant

## 2019-05-08 ENCOUNTER — Other Ambulatory Visit: Payer: Self-pay | Admitting: Adult Health

## 2019-05-08 ENCOUNTER — Other Ambulatory Visit: Payer: Self-pay | Admitting: Physician Assistant

## 2019-05-08 DIAGNOSIS — E039 Hypothyroidism, unspecified: Secondary | ICD-10-CM

## 2019-05-08 MED ORDER — LEVOTHYROXINE SODIUM 137 MCG PO TABS: ORAL_TABLET | ORAL | 2 refills | Status: DC

## 2019-05-11 ENCOUNTER — Other Ambulatory Visit: Payer: Self-pay

## 2019-05-11 ENCOUNTER — Encounter: Payer: 59 | Attending: General Surgery | Admitting: Dietician

## 2019-05-11 DIAGNOSIS — E669 Obesity, unspecified: Secondary | ICD-10-CM | POA: Insufficient documentation

## 2019-05-11 NOTE — Progress Notes (Signed)
Pre-Operative Nutrition Class   Appt Start Time: 5:15pm     End Time: 7:15pm   Patient was seen on 05/11/2019 for Pre-Operative Bariatric Surgery Education at [facility].    Surgery date: 05/26/2019 Surgery type: Sleeve   Start weight at [facility]: 263 lbs (date: 11/26/2018) Weight today: 280.2 lbs BMI: 46.6 kg/m2   Samples Given per MNT Protocol (pt educated on appropriate usage) Protein2O Protein Drink  Lot # [id number] CCP 0139 Exp: Nov 2021  Bariatric Fusion Multivitamin Chews  Bariatric Advantage Calcium Citrate Lot # [id number] Exp: 04/08/2020   The following the learning objectives were met by the patient during this course:  Identify Pre-Op Dietary Goals and will begin 2 weeks pre-operatively  Identify appropriate sources of fluids and proteins   State protein recommendations and appropriate sources pre and post-operatively  Identify Post-Operative Dietary Goals and will follow for 2 weeks post-operatively  Identify appropriate multivitamin and calcium sources  Describe the need for physical activity post-operatively and will follow MD recommendations  State when to call healthcare provider regarding medication questions or post-operative complications   Handouts given during class include:  Pre-Op Bariatric Surgery Diet Handout  Protein Shake Handout  Post-Op Bariatric Surgery Nutrition Handout  BELT Program Information Flyer  Support Group Information Flyer  [facility] Bariatric Supplements Price List   Follow-Up Plan: Patient will follow-up at [facility] 2 weeks post operatively for diet advancement per MD.

## 2019-05-12 ENCOUNTER — Encounter: Payer: Self-pay | Admitting: Dietician

## 2019-05-13 ENCOUNTER — Encounter: Payer: Self-pay | Admitting: Physician Assistant

## 2019-05-13 ENCOUNTER — Ambulatory Visit: Payer: 59 | Admitting: Physician Assistant

## 2019-05-13 ENCOUNTER — Other Ambulatory Visit: Payer: Self-pay

## 2019-05-13 VITALS — BP 122/78 | HR 88 | Temp 97.2°F | Ht 65.0 in | Wt 282.0 lb

## 2019-05-13 DIAGNOSIS — I10 Essential (primary) hypertension: Secondary | ICD-10-CM | POA: Diagnosis not present

## 2019-05-13 DIAGNOSIS — Z79899 Other long term (current) drug therapy: Secondary | ICD-10-CM

## 2019-05-13 DIAGNOSIS — J439 Emphysema, unspecified: Secondary | ICD-10-CM | POA: Diagnosis not present

## 2019-05-13 DIAGNOSIS — I700 Atherosclerosis of aorta: Secondary | ICD-10-CM

## 2019-05-13 DIAGNOSIS — I471 Supraventricular tachycardia: Secondary | ICD-10-CM

## 2019-05-13 DIAGNOSIS — J449 Chronic obstructive pulmonary disease, unspecified: Secondary | ICD-10-CM

## 2019-05-13 DIAGNOSIS — E559 Vitamin D deficiency, unspecified: Secondary | ICD-10-CM

## 2019-05-13 DIAGNOSIS — E782 Mixed hyperlipidemia: Secondary | ICD-10-CM

## 2019-05-13 DIAGNOSIS — Z131 Encounter for screening for diabetes mellitus: Secondary | ICD-10-CM

## 2019-05-13 DIAGNOSIS — Z130 Encounter for screening for diseases of the blood and blood-forming organs and certain disorders involving the immune mechanism: Secondary | ICD-10-CM

## 2019-05-13 DIAGNOSIS — E079 Disorder of thyroid, unspecified: Secondary | ICD-10-CM

## 2019-05-13 DIAGNOSIS — M069 Rheumatoid arthritis, unspecified: Secondary | ICD-10-CM

## 2019-05-13 DIAGNOSIS — G4736 Sleep related hypoventilation in conditions classified elsewhere: Secondary | ICD-10-CM

## 2019-05-13 MED ORDER — VITAMIN D (ERGOCALCIFEROL) 1.25 MG (50000 UNIT) PO CAPS: ORAL_CAPSULE | ORAL | 1 refills | Status: DC

## 2019-05-13 NOTE — Patient Instructions (Signed)
Your LDL could improve, ideally we want it under a 100.  Your LDL is the bad cholesterol that can lead to heart attack and stroke. To lower your number you can decrease your fatty foods, red meat, cheese, milk and increase fiber like whole grains and veggies. You can also add a fiber supplement like Citracel or Benefiber, these do not cause gas and bloating and are safe to use. Especially if you have a strong family history of heart disease or stroke or you have evidence of plaque on any imaging like a chest xray, we may discuss at your next office visit putting you on a medication to get your number below 100.   VITAMIN D IS IMPORTANT  Vitamin D goal is between 60-80  Please make sure that you are taking your Vitamin D as directed.   It is very important as a natural anti-inflammatory   helping hair, skin, and nails, as well as reducing stroke and heart attack risk.   It helps your bones and helps with mood.  We want you on at least 5000 IU daily  It also decreases numerous cancer risks so please take it as directed.   Low Vit D is associated with a 200-300% higher risk for CANCER   and 200-300% higher risk for HEART   ATTACK  &  STROKE.    .....................................Marland Kitchen  It is also associated with higher death rate at younger ages,   autoimmune diseases like Rheumatoid arthritis, Lupus, Multiple Sclerosis.     Also many other serious conditions, like depression, Alzheimer's  Dementia, infertility, muscle aches, fatigue, fibromyalgia - just to name a few.  +++++++++++++++++++  Can get liquid vitamin D from [place] here in [place] at  [facility] alternatives [address] [place], [place] [address] Or you can try earth fare     Drink 1/2 your body weight in fluid ounces of water daily; drink a tall glass of water 30 min before meals  Don't eat until you're stuffed- listen to your stomach and eat until you are 80% full   Try eating off of a salad plate; wait 10 min  after finishing before going back for seconds  Start by eating the vegetables on your plate; aim for 50% of your meals to be fruits or vegetables  Then eat your protein - lean meats (grass fed if possible), fish, beans, nuts in moderation  Eat your carbs/starch last ONLY if you still are hungry. If you can, stop before finishing it all  Avoid sugar and flour - the closer it looks to it's original form in nature, typically the better it is for you  Splurge in moderation - "assign" days when you get to splurge and have the "bad stuff" - I like to follow a 80% - 20% plan- "good" choices 80 % of the time, "bad" choices in moderation 20% of the time  Simple equation is: Calories out > calories in = weight loss - even if you eat the bad stuff, if you limit portions, you will still lose weight

## 2019-05-13 NOTE — Progress Notes (Signed)
Assessment and Plan:   Essential hypertension - continue medications, DASH diet, exercise and monitor at home. Call if greater than 130/80.  -     CBC with Differential/Platelet -     COMPLETE METABOLIC PANEL WITH GFR -     TSH  PSVT (paroxysmal supraventricular tachycardia) (HCC) Control blood pressure, cholesterol, glucose, increase exercise.   SVT (supraventricular tachycardia) (HCC) Continue cardio follow up  Aortic atherosclerosis (HCC) Control blood pressure, cholesterol, glucose, increase exercise.   Chronic obstructive pulmonary disease, unspecified COPD type ([place]) Not smoking x 3 months for surgery, commended patient for quitting and discussed strategies to not start back up  OSA and COPD overlap syndrome (HCC) Continue cPAP  Nocturnal hypoxemia due to emphysema (HCC) Continue CPAP  Rheumatoid arthritis, involving unspecified site, unspecified rheumatoid factor presence ([place]) Continue follow up  Thyroid disease Hypothyroidism-check TSH level, continue medications the same, reminded to take on an empty stomach 30-27mins before food.   Mixed hyperlipidemia check lipids decrease fatty foods increase activity.  -     Lipid panel check lipids decrease fatty foods increase activity.   Vitamin D deficiency -     VITAMIN D 25 Hydroxy (Vit-D Deficiency, Fractures)  Abnormal glucose -     Hemoglobin A1c Discussed disease progression and risks Discussed diet/exercise, weight management and risk modification  Medication management -     Magnesium     Continue diet and meds as discussed. Further disposition pending results of labs. Future Appointments  Date Time Provider [place]  05/20/2019  9:00 AM [facility] PAT 2 [facility] None  05/22/2019  9:40 AM [facility]ENING [facility] None  09/01/2019  3:00 PM Vicie Mutters, PA-C [name] None    HPI 58 y.o. female  presents for 3 month follow up with hypertension, hyperlipidemia, prediabetes and vitamin D and  CPE  Her blood pressure has been controlled at home, her BP is doing better with ziac and lasix pill added in June for swelling/weight gain, today their BP is BP: 122/78   Patient has history of ablation for SVT with Dr. Lajuana Ripple in 2016.  She has a history of RA and was on humira, then [place] but states this is not working, going to switch after her surgery  Following with Dr. Amil Amen.   She scheduled to get the gastric sleeve Oct 13th. Has quit smoking.  BMI is Body mass index is 46.93 kg/m., she is working on diet and exercise. Wt Readings from Last 3 Encounters:  05/13/19 282 lb (127.9 kg)  05/11/19 280 lb 3.2 oz (127.1 kg)  03/24/19 273 lb (123.8 kg)   She is on 20mg  of lexapro and she is doing better. She does not workout.  She denies chest pain, shortness of breath, dizziness.  She is not on cholesterol medication and denies myalgias. Her cholesterol is at goal. The cholesterol last visit was:   Lab Results  Component Value Date   CHOL 209 (H) 01/08/2019   HDL 42 (L) 01/08/2019   LDLCALC 127 (H) 01/08/2019   TRIG 262 (H) 01/08/2019   CHOLHDL 5.0 (H) 01/08/2019   She has been working on diet and exercise for prediabetes, and denies paresthesia of the feet, polydipsia and polyuria. Last A1C in the office was:  Lab Results  Component Value Date   HGBA1C 5.7 (H) 01/08/2019   Patient is on Vitamin D supplement, she is on 10,000.   Lab Results  Component Value Date   VD25OH 16 (L) 01/08/2019     She is on  thyroid medication. Her medication is on 138mcg daily with water 1 hour before food.    Lab Results  Component Value Date   TSH 9.14 (H) 01/08/2019  .  She has a B12 def, beginning of this month.  Lab Results  Component Value Date   VITAMINB12 208 08/12/2018    Current Medications:  Current Outpatient Medications on File Prior to Visit  Medication Sig Dispense Refill  . acetaminophen (TYLENOL) 650 MG CR tablet Take 1,300 mg by mouth 2 (two) times daily as  needed for pain.    . bisoprolol-hydrochlorothiazide (ZIAC) 5-6.25 MG tablet Take 1 tablet by mouth daily. 90 tablet 1  . Cholecalciferol (VITAMIN D3) 10000 units capsule Take 10,000 Units by mouth. Sometimes    . cyanocobalamin (,VITAMIN B-12,) 1000 MCG/ML injection Inject 1 mL (1,000 mcg total) into the skin every 30 (thirty) days. 30 mL 2  . diphenhydrAMINE (BENADRYL) 25 MG tablet Take 25 mg by mouth daily.     Marland Kitchen escitalopram (LEXAPRO) 20 MG tablet TAKE 1 TABLET BY MOUTH EVERY DAY 30 tablet 5  . furosemide (LASIX) 40 MG tablet Take 1 tablet (40 mg total) by mouth daily. 90 tablet 3  . leflunomide (ARAVA) 20 MG tablet Take 20 mg by mouth daily.    Marland Kitchen levothyroxine (SYNTHROID) 137 MCG tablet Take one tablet by mouth every morning on empty stomach with water, 67min prior to eating.  Wait 4 hours for antacids. 30 tablet 2  . morphine (MSIR) 15 MG tablet Take 15 mg by mouth 3 (three) times daily as needed.    . NON FORMULARY Apply 1 application topically at bedtime. Dermatologist Office Cream for Rosacea    . olmesartan (BENICAR) 20 MG tablet TAKE 2 TABLETS (40MG ) BY MOUTH DAILY 60 tablet 5  . pantoprazole (PROTONIX) 20 MG tablet Take 1 tablet Daily for Indigestion & Heartburn 90 tablet 0  . VOLTAREN 1 % GEL Apply 2 g topically 4 (four) times daily as needed (joint pain). 100 g 2   No current facility-administered medications on file prior to visit.    Medical History:  Past Medical History:  Diagnosis Date  . Allergy   . Arthritis    "knees, hands, ankles, ~ every joint" (04/08/2015)  . Basal cell carcinoma    "face, hands, chest" (04/08/2015)  . Chronic bronchitis ([place])    "get it pretty much q yr" (04/08/2015)  . Dysrhythmia    palpitations occ; SVT 09/2013 s/p adenosine  . GERD (gastroesophageal reflux disease)   . H/O hiatal hernia   . Hx of cardiovascular stress test    Lexiscan Myoview 6/16: Ejection fraction is 60% and wall motion is normal. The study is normal. There is no scar or  ischemia. This is a low risk scan.  . Hyperlipidemia   . Hypertension   . Hypothyroidism   . Palpitations   . Pneumonia 2015 X 1  . PONV (postoperative nausea and vomiting)   . S/P total knee arthroplasty 02/15/2014  . Thyroid disease   . Vitamin D deficiency     Allergies Allergies  Allergen Reactions  . Darvocet [Propoxyphene N-Acetaminophen] Anaphylaxis  . Darvon [Propoxyphene Hcl] Anaphylaxis and Other (See Comments)    Airway swelling   . Propoxyphene Anaphylaxis    Throat closes up  . Doxycycline Nausea Only  . Tamiflu [Oseltamivir Phosphate] Hives and Other (See Comments)    Increased blood pressure    SURGICAL HISTORY She  has a past surgical history that includes Carpal tunnel  release (Right, 1990); Tubal ligation (1987); Knee arthroscopy (Right, 2013); Bladder repair (1993); Endometrial ablation (2009); Excision basal cell carcinoma (2014); Partial knee arthroplasty (Right, 02/15/2014); Cardiac catheterization (N/A, 04/08/2015); Joint replacement; Incontinence surgery (2010); Knee arthroscopy; Cardiac surgery; Colonoscopy; Polypectomy; and Colonoscopy with propofol (N/A, 11/25/2017). FAMILY HISTORY Her family history includes Atrial fibrillation in her mother; Breast cancer in her paternal aunt; COPD in her father; Diabetes in her father; Emphysema in her father; Heart attack in her maternal grandmother; Heart disease in her maternal grandfather, paternal grandfather, and paternal grandmother; Hyperlipidemia in her maternal grandfather, paternal grandfather, and paternal grandmother; Hypertension in her mother; Stroke in her maternal grandmother. SOCIAL HISTORY She  reports that she quit smoking about 2 months ago. Her smoking use included cigarettes. She has a 60.00 pack-year smoking history. She has never used smokeless tobacco. She reports that she does not drink alcohol or use drugs.   Review of Systems  Constitutional: Positive for malaise/fatigue. Negative for chills,  diaphoresis, fever and weight loss.  HENT: Negative.   Eyes: Negative.   Respiratory: Positive for cough, shortness of breath (with exeriton) and wheezing.   Cardiovascular: Positive for leg swelling. Negative for chest pain, palpitations, orthopnea, claudication and PND.  Gastrointestinal: Negative.   Genitourinary: Negative.   Musculoskeletal: Positive for back pain. Negative for falls, joint pain, myalgias and neck pain.  Skin: Negative.   Neurological: Negative for dizziness, tingling, tremors, sensory change, speech change, focal weakness, seizures, loss of consciousness, weakness and headaches.  Psychiatric/Behavioral: Negative for depression, hallucinations, memory loss, substance abuse and suicidal ideas. The patient is not nervous/anxious and does not have insomnia.     Physical Exam: BP 122/78   Pulse 88   Temp (!) 97.2 F (36.2 C)   Ht 5\' 5"  (1.651 m)   Wt 282 lb (127.9 kg)   SpO2 98%   BMI 46.93 kg/m  Wt Readings from Last 3 Encounters:  05/13/19 282 lb (127.9 kg)  05/11/19 280 lb 3.2 oz (127.1 kg)  03/24/19 273 lb (123.8 kg)   General Appearance: Well nourished, in no apparent distress. Eyes: PERRLA, EOMs, conjunctiva no swelling or erythema Sinuses: No Frontal/maxillary tenderness ENT/Mouth: Ext aud canals clear, TMs without erythema, bulging. No erythema, swelling, or exudate on post pharynx.  Tonsils not swollen or erythematous. Hearing normal.  Neck: Supple, thyroid normal.  Respiratory: Respiratory effort normal, BS equal bilaterally with wheezing left lower lung rhonchi, or stridor.  Cardio: RRR with no MRGs. Brisk peripheral pulses with 2+ edema.  Abdomen: Soft, + BS, obese Non tender, no guarding, rebound, hernias, masses. Lymphatics: Non tender without lymphadenopathy.  Musculoskeletal: Full ROM, 5/5 strength, antalgic gait Skin: Warm, dry without rashes, lesions, ecchymosis.  Neuro: Cranial nerves intact. Normal muscle tone, no cerebellar symptoms.  Sensation intact.  Psych: Awake and oriented X 3, normal affect, Insight and Judgment appropriate.    Vicie Mutters, PA-C 3:47 PM [facility]

## 2019-05-14 LAB — IRON,TIBC AND FERRITIN PANEL
%SAT: 23 % (ref 16–45)
Ferritin: 139 ng/mL (ref 16–232)
Iron: 79 ug/dL (ref 45–160)
TIBC: 346 ug/dL (ref 250–450)

## 2019-05-14 LAB — COMPLETE METABOLIC PANEL WITH GFR
AG Ratio: 1.6 (calc) (ref 1.0–2.5)
ALT: 57 U/L — ABNORMAL HIGH (ref 6–29)
AST: 39 U/L — ABNORMAL HIGH (ref 10–35)
Albumin: 4.1 g/dL (ref 3.6–5.1)
Alkaline phosphatase (APISO): 93 U/L (ref 37–153)
BUN/Creatinine Ratio: 22 (calc) (ref 6–22)
BUN: 24 mg/dL (ref 7–25)
CO2: 29 mmol/L (ref 20–32)
Calcium: 9.7 mg/dL (ref 8.6–10.4)
Chloride: 102 mmol/L (ref 98–110)
Creat: 1.1 mg/dL — ABNORMAL HIGH (ref 0.50–1.05)
GFR, Est African American: 65 mL/min/{1.73_m2} (ref >=60)
GFR, Est Non African American: 56 mL/min/{1.73_m2} — ABNORMAL LOW (ref >=60)
Globulin: 2.6 g/dL (calc) (ref 1.9–3.7)
Glucose, Bld: 102 mg/dL — ABNORMAL HIGH (ref 65–99)
Potassium: 3.9 mmol/L (ref 3.5–5.3)
Sodium: 140 mmol/L (ref 135–146)
Total Bilirubin: 0.3 mg/dL (ref 0.2–1.2)
Total Protein: 6.7 g/dL (ref 6.1–8.1)

## 2019-05-14 LAB — LIPID PANEL
Cholesterol: 209 mg/dL — ABNORMAL HIGH (ref <200)
HDL: 39 mg/dL — ABNORMAL LOW (ref >=50)
LDL Cholesterol (Calc): 122 mg/dL (calc) — ABNORMAL HIGH
Non-HDL Cholesterol (Calc): 170 mg/dL (calc) — ABNORMAL HIGH (ref <130)
Total CHOL/HDL Ratio: 5.4 (calc) — ABNORMAL HIGH (ref <5.0)
Triglycerides: 336 mg/dL — ABNORMAL HIGH (ref <150)

## 2019-05-14 LAB — CBC WITH DIFFERENTIAL/PLATELET
Absolute Monocytes: 734 cells/uL (ref 200–950)
Basophils Absolute: 43 cells/uL (ref 0–200)
Basophils Relative: 0.6 %
Eosinophils Absolute: 94 cells/uL (ref 15–500)
Eosinophils Relative: 1.3 %
HCT: 33.1 % — ABNORMAL LOW (ref 35.0–45.0)
Hemoglobin: 10.9 g/dL — ABNORMAL LOW (ref 11.7–15.5)
Lymphs Abs: 1930 cells/uL (ref 850–3900)
MCH: 32.5 pg (ref 27.0–33.0)
MCHC: 32.9 g/dL (ref 32.0–36.0)
MCV: 98.8 fL (ref 80.0–100.0)
MPV: 11.9 fL (ref 7.5–12.5)
Monocytes Relative: 10.2 %
Neutro Abs: 4399 cells/uL (ref 1500–7800)
Neutrophils Relative %: 61.1 %
Platelets: 263 10*3/uL (ref 140–400)
RBC: 3.35 10*6/uL — ABNORMAL LOW (ref 3.80–5.10)
RDW: 13 % (ref 11.0–15.0)
Total Lymphocyte: 26.8 %
WBC: 7.2 10*3/uL (ref 3.8–10.8)

## 2019-05-14 LAB — HEMOGLOBIN A1C
Hgb A1c MFr Bld: 5.9 % of total Hgb — ABNORMAL HIGH (ref <5.7)
Mean Plasma Glucose: 123 (calc)
eAG (mmol/L): 6.8 (calc)

## 2019-05-14 LAB — TSH: TSH: 4.65 mIU/L — ABNORMAL HIGH (ref 0.40–4.50)

## 2019-05-14 LAB — TEST AUTHORIZATION

## 2019-05-14 LAB — MAGNESIUM: Magnesium: 2 mg/dL (ref 1.5–2.5)

## 2019-05-14 LAB — VITAMIN D 25 HYDROXY (VIT D DEFICIENCY, FRACTURES): Vit D, 25-Hydroxy: 19 ng/mL — ABNORMAL LOW (ref 30–100)

## 2019-05-14 NOTE — Addendum Note (Signed)
Addended by: Vicie Mutters R on: 05/14/2019 08:31 AM   Modules accepted: Orders

## 2019-05-20 ENCOUNTER — Encounter (HOSPITAL_COMMUNITY)
Admission: RE | Admit: 2019-05-20 | Discharge: 2019-05-20 | Disposition: A | Discharge status: Home / Self Care | Payer: 59 | Source: Ambulatory Visit | Attending: General Surgery | Admitting: General Surgery

## 2019-05-20 ENCOUNTER — Other Ambulatory Visit: Payer: Self-pay

## 2019-05-20 ENCOUNTER — Encounter (HOSPITAL_COMMUNITY): Payer: Self-pay

## 2019-05-20 DIAGNOSIS — Z01812 Encounter for preprocedural laboratory examination: Secondary | ICD-10-CM | POA: Diagnosis present

## 2019-05-20 HISTORY — DX: Sleep apnea, unspecified: G47.30

## 2019-05-20 HISTORY — DX: Prediabetes: R73.03

## 2019-05-20 HISTORY — DX: Anemia, unspecified: D64.9

## 2019-05-20 LAB — CBC
HCT: 38 % (ref 36.0–46.0)
Hemoglobin: 11.8 g/dL — ABNORMAL LOW (ref 12.0–15.0)
MCH: 32.5 pg (ref 26.0–34.0)
MCHC: 31.1 g/dL (ref 30.0–36.0)
MCV: 104.7 fL — ABNORMAL HIGH (ref 80.0–100.0)
Platelets: 255 10*3/uL (ref 150–400)
RBC: 3.63 MIL/uL — ABNORMAL LOW (ref 3.87–5.11)
RDW: 13.9 % (ref 11.5–15.5)
WBC: 7.6 10*3/uL (ref 4.0–10.5)
nRBC: 0 % (ref 0.0–0.2)

## 2019-05-20 LAB — BASIC METABOLIC PANEL
Anion gap: 10 (ref 5–15)
BUN: 18 mg/dL (ref 6–20)
CO2: 27 mmol/L (ref 22–32)
Calcium: 9.4 mg/dL (ref 8.9–10.3)
Chloride: 104 mmol/L (ref 98–111)
Creatinine, Ser: 0.88 mg/dL (ref 0.44–1.00)
GFR calc Af Amer: >60 mL/min (ref >60)
GFR calc non Af Amer: >60 mL/min (ref >60)
Glucose, Bld: 122 mg/dL — ABNORMAL HIGH (ref 70–99)
Potassium: 3.9 mmol/L (ref 3.5–5.1)
Sodium: 141 mmol/L (ref 135–145)

## 2019-05-20 NOTE — Patient Instructions (Addendum)
DUE TO COVID-19 ONLY ONE VISITOR IS ALLOWED TO COME WITH YOU AND STAY IN THE WAITING ROOM ONLY DURING PRE OP AND PROCEDURE DAY OF SURGERY. THE 1 VISITOR MAY VISIT WITH YOU AFTER SURGERY IN YOUR PRIVATE ROOM DURING VISITING HOURS ONLY!  YOU NEED TO HAVE A COVID 19 TEST ON_10/9______ @_______ , THIS TEST MUST BE DONE BEFORE SURGERY, COME  Sherry Dennis , [id number].  [name] ONCE YOUR COVID TEST IS COMPLETED, PLEASE BEGIN THE QUARANTINE INSTRUCTIONS AS OUTLINED IN YOUR HANDOUT.                Sherry Dennis    Your procedure is scheduled on: 05/26/19   Report to [facility] Main  Entrance   Report to admitting at  10:55 AM     Call this number if you have problems the morning of surgery [phone]    Remember: Do not eat food or drink liquids :After Midnight.   BRUSH YOUR TEETH MORNING OF SURGERY AND RINSE YOUR MOUTH OUT, NO CHEWING GUM CANDY OR MINTS.     PAIN IS EXPECTED AFTER SURGERY AND WILL NOT BE COMPLETELY ELIMINATED. AMBULATION AND TYLENOL WILL HELP REDUCE INCISIONAL AND GAS PAIN. MOVEMENT IS KEY!  YOU ARE EXPECTED TO BE OUT OF BED WITHIN 4 HOURS OF ADMISSION TO YOUR PATIENT ROOM.  SITTING IN THE RECLINER THROUGHOUT THE DAY IS IMPORTANT FOR DRINKING FLUIDS AND MOVING GAS THROUGHOUT THE GI TRACT.  COMPRESSION STOCKINGS SHOULD BE WORN [place] UNLESS YOU ARE WALKING.   INCENTIVE SPIROMETER SHOULD BE USED EVERY HOUR WHILE AWAKE TO DECREASE POST-OPERATIVE COMPLICATIONS SUCH AS PNEUMONIA.  WHEN DISCHARGED HOME, IT IS IMPORTANT TO CONTINUE TO WALK EVERY HOUR AND USE THE INCENTIVE SPIROMETER EVERY HOUR.          Take these medicines the morning of surgery with A SIP OF WATER: Lexapro, Levothyroxine, Protonix                                 You may not have any metal on your body including hair pins and              piercings  Do not wear jewelry, make-up, lotions, powders or perfumes, deodorant             Do  not wear nail polish on your fingernails.  Do not shave  48 hours prior to surgery.     Do not bring valuables to the hospital. [place].  Contacts, dentures or bridgework may not be worn into surgery.         Name and phone number of your driver:  Special Instructions: N/A              Please read over the following fact sheets you were given: _____________________________________________________________________             [facility] - Inpatient - Preparing for Surgery  Before surgery, you can play an important role.   Because skin is not sterile, your skin needs to be as free of germs as possible.   You can reduce the number of germs on your skin by washing with CHG (chlorahexidine gluconate) soap before surgery.   CHG is an antiseptic cleaner which kills germs and bonds with the skin to continue killing germs  even after washing. Please DO NOT use if you have an allergy to CHG or antibacterial soaps.   If your skin becomes reddened/irritated stop using the CHG and inform your nurse when you arrive at Short Stay. Do not shave (including legs and underarms) for at least 48 hours prior to the first CHG shower.    Please follow these instructions carefully:  1.  Shower with CHG Soap the night before surgery and the  morning of Surgery.  2.  If you choose to wash your hair, wash your hair first as usual with your  normal  shampoo.  3.  After you shampoo, rinse your hair and body thoroughly to remove the  shampoo.                                        4.  Use CHG as you would any other liquid soap.  You can apply chg directly  to the skin and wash                       Gently with a scrungie or clean washcloth.  5.  Apply the CHG Soap to your body ONLY FROM THE NECK DOWN.   Do not use on face/ open                           Wound or open sores. Avoid contact with eyes, ears mouth and genitals (private parts).                       Wash face,   Genitals (private parts) with your normal soap.             6.  Wash thoroughly, paying special attention to the area where your surgery  will be performed.  7.  Thoroughly rinse your body with warm water from the neck down.  8.  DO NOT shower/wash with your normal soap after using and rinsing off  the CHG Soap.             9.  Pat yourself dry with a clean towel.            10.  Wear clean pajamas.            11.  Place clean sheets on your bed the night of your first shower and do not  sleep with pets. Day of Surgery : Do not apply any lotions/deodorants the morning of surgery.  Please wear clean clothes to the hospital/surgery center.  FAILURE TO FOLLOW THESE INSTRUCTIONS MAY RESULT IN THE CANCELLATION OF YOUR SURGERY PATIENT SIGNATURE_________________________________  NURSE SIGNATURE__________________________________  ________________________________________________________________________   Sherry Dennis  An incentive spirometer is a tool that can help keep your lungs clear and active. This tool measures how well you are filling your lungs with each breath. Taking long deep breaths may help reverse or decrease the chance of developing breathing (pulmonary) problems (especially infection) following:  A long period of time when you are unable to move or be active. BEFORE THE PROCEDURE   If the spirometer includes an indicator to show your best effort, your nurse or respiratory therapist will set it to a desired goal.  If possible, sit up straight or lean slightly forward. Try not to slouch.  Hold the incentive spirometer in an upright position. INSTRUCTIONS  FOR USE  1. Sit on the edge of your bed if possible, or sit up as far as you can in bed or on a chair. 2. Hold the incentive spirometer in an upright position. 3. Breathe out normally. 4. Place the mouthpiece in your mouth and seal your lips tightly around it. 5. Breathe in slowly and as deeply as possible, raising the  piston or the ball toward the top of the column. 6. Hold your breath for 3-5 seconds or for as long as possible. Allow the piston or ball to fall to the bottom of the column. 7. Remove the mouthpiece from your mouth and breathe out normally. 8. Rest for a few seconds and repeat Steps 1 through 7 at least 10 times every 1-2 hours when you are awake. Take your time and take a few normal breaths between deep breaths. 9. The spirometer may include an indicator to show your best effort. Use the indicator as a goal to work toward during each repetition. 10. After each set of 10 deep breaths, practice coughing to be sure your lungs are clear. If you have an incision (the cut made at the time of surgery), support your incision when coughing by placing a pillow or rolled up towels firmly against it. Once you are able to get out of bed, walk around indoors and cough well. You may stop using the incentive spirometer when instructed by your caregiver.  RISKS AND COMPLICATIONS  Take your time so you do not get dizzy or light-headed.  If you are in pain, you may need to take or ask for pain medication before doing incentive spirometry. It is harder to take a deep breath if you are having pain. AFTER USE  Rest and breathe slowly and easily.  It can be helpful to keep track of a log of your progress. Your caregiver can provide you with a simple table to help with this. If you are using the spirometer at home, follow these instructions: [place] IF:   You are having difficultly using the spirometer.  You have trouble using the spirometer as often as instructed.  Your pain medication is not giving enough relief while using the spirometer.  You develop fever of 100.5 F (38.1 C) or higher. SEEK IMMEDIATE MEDICAL CARE IF:   You cough up bloody sputum that had not been present before.  You develop fever of 102 F (38.9 C) or greater.  You develop worsening pain at or near the incision  site. MAKE SURE YOU:   Understand these instructions.  Will watch your condition.  Will get help right away if you are not doing well or get worse. Document Released: 12/10/2006 Document Revised: 10/22/2011 Document Reviewed: 02/10/2007 [facility] Patient Information 2014 [place], [place].   ________________________________________________________________________

## 2019-05-20 NOTE — Progress Notes (Signed)
PCP - Dr. Viona Gilmore. [place] Cardiologist - Dr. Lovena Le  Chest x-ray - 01/19/19 EKG - 03/20/19 Stress Test -  ECHO - 04/01/19 Cardiac Cath - 2016  Sleep Study - yes CPAP - yes  Fasting Blood Sugar - doesn't know and doesn't check it Checks Blood Sugar _____ times a day  Blood Thinner Instructions:NA Aspirin Instructions: Last Dose:  Anesthesia review:   Patient denies shortness of breath, fever, cough and chest pain at PAT appointment yes  Patient verbalized understanding of instructions that were given to them at the PAT appointment. Patient was also instructed that they will need to review over the PAT instructions again at home before surgery. Yes  Pt will see her Rheumatologist about her RA medications on 05/21/19

## 2019-05-21 NOTE — Progress Notes (Signed)
Anesthesia Chart Review   Case: [id number] Date/Time: 05/26/19 1240   Procedure: LAPAROSCOPIC GASTRIC SLEEVE RESECTION, Upper Endo, ERAS Pathway (N/A )   Anesthesia type: General   Pre-op diagnosis: Morbid Obesity, RA, HTN, Pre Diabetes, COPD, SVT, Aortic Atherosclerosis, Joint Pain, GERD   Location: [facility] ROOM 02 / [facility]   Surgeon: Greer Pickerel, MD      DISCUSSION:58 y.o. former smoker (60 pack years, quit 02/11/2019) with h/o PONV, HTN, HLD, hiatal hernia, GERD, hypothyroidism, pre-diabetes, COPD, morbid obesity scheduled for above procedure 05/26/2019 with Dr. Greer Pickerel.   Last seen by pulmonologist, Dr. Christinia Gully, 03/25/2019.  Per OV note, "as she has no limiting doe, no emphysema on CT and no tendency to aecopd now that off cigs does not need preop pfts or any preop "tune up" or special precautions other than early mobilization/ min narcs as per rountine for MO pts."  Last seen by cardiologist, Dr. Adrian Prows, 03/20/2019.  Per OV note, "With regard to cardiac risk stratification, if the echocardiogram reveals normal LVEF and nuclear stress test, today protocol does not show significant ischemia, I will send my letter to the surgeon Dr. Greer Pickerel."  Normal Echo 8//19/2020 as outlined in results.   Anticipate pt can proceed with planned procedure barring acute status change.   VS: BP (!) 150/86   Pulse 73   Temp 36.9 C (Oral)   Resp 18   Ht 5\' 5"  (1.651 m)   Wt 126.1 kg   SpO2 97%   BMI 46.26 kg/m   PROVIDERS: Unk Pinto, MD is PCP  Cristopher Peru, MD is Cardiologist  LABS: Labs reviewed: Acceptable for surgery. (all labs ordered are listed, but only abnormal results are displayed)  Labs Reviewed  CBC - Abnormal; Notable for the following components:      Result Value   RBC 3.63 (*)    Hemoglobin 11.8 (*)    MCV 104.7 (*)    All other components within normal limits  BASIC METABOLIC PANEL - Abnormal; Notable for the following components:   Glucose, Bld 122 (*)    All other components within normal limits     IMAGES:   EKG: 03/20/2019 Rate 56 bpm Sinus bradycardia Frequent ectopic ventricular beats Negative T-waves-anterior ischemia  CV: Echo 04/01/2019 Conclusions:  1. Normal LV systolic function with EF 67%. Left ventricle cavity is normal in size.  Mild concentric hypertrophy of the left ventricle.  Normal global wall motion.  Doppler evidence of grade 1 (impaired) diastolic dysfunction, elevated LAP.  Calculated EF 57%.  2. Small circumferential pericardial effusion with clear fluids.  No evidence of tamponage.   Myocardial Perfusion Imaging 01/19/2015 Ejection fraction is 60% and wall motion is normal. The study is normal. There is no scar or ischemia. This is a low risk scan. Past Medical History:  Diagnosis Date  . Allergy   . Anemia   . Arthritis    "knees, hands, ankles, ~ every joint" (04/08/2015)  . Basal cell carcinoma    "face, hands, chest" (04/08/2015)  . Chronic bronchitis ([place])    "get it pretty much q yr" (04/08/2015)  . Dysrhythmia    palpitations occ; SVT 09/2013 s/p adenosine  . GERD (gastroesophageal reflux disease)   . H/O hiatal hernia   . Hx of cardiovascular stress test    Lexiscan Myoview 6/16: Ejection fraction is 60% and wall motion is normal. The study is normal. There is no scar or ischemia. This is a low risk scan.  Marland Kitchen  Hyperlipidemia   . Hypertension   . Hypothyroidism   . Palpitations   . Pneumonia 2015 X 1  . PONV (postoperative nausea and vomiting)   . Pre-diabetes   . S/P total knee arthroplasty 02/15/2014  . Sleep apnea   . Thyroid disease   . Vitamin D deficiency     Past Surgical History:  Procedure Laterality Date  . BASAL CELL CARCINOMA EXCISION  2014   "off my chest"  . BLADDER REPAIR  1993   "lasered the holes shut"  . CARDIAC CATHETERIZATION  2017  . CARDIAC SURGERY    . CARPAL TUNNEL RELEASE Right 1990  . COLONOSCOPY    . COLONOSCOPY WITH PROPOFOL N/A 11/25/2017   Procedure:  COLONOSCOPY WITH PROPOFOL;  Surgeon: Mauri Pole, MD;  [facility]: [facility];  Service: Endoscopy;  Laterality: N/A;  . ELECTROPHYSIOLOGIC STUDY N/A 04/08/2015   Procedure: SVT Ablation;  Surgeon: Evans Lance, MD;  Location: [facility];  Service: Cardiovascular;  Laterality: N/A;  . ENDOMETRIAL ABLATION  2009  . INCONTINENCE SURGERY  2010  . JOINT REPLACEMENT    . KNEE ARTHROSCOPY Right 2013  . KNEE ARTHROSCOPY    . PARTIAL KNEE ARTHROPLASTY Right 02/15/2014   Procedure: RIGHT UNICOMPARTMENTAL KNEE (MEDIAL COMPARTMENT);  Surgeon: Vickey Huger, MD;  Location: [facility];  Service: Orthopedics;  Laterality: Right;  . POLYPECTOMY    . TUBAL LIGATION  1987    MEDICATIONS: . acetaminophen (TYLENOL) 650 MG CR tablet  . bisoprolol-hydrochlorothiazide (ZIAC) 5-6.25 MG tablet  . cyanocobalamin (,VITAMIN B-12,) 1000 MCG/ML injection  . diphenhydrAMINE (BENADRYL) 25 MG tablet  . escitalopram (LEXAPRO) 20 MG tablet  . furosemide (LASIX) 40 MG tablet  . leflunomide (ARAVA) 20 MG tablet  . levothyroxine (SYNTHROID) 137 MCG tablet  . morphine (MSIR) 15 MG tablet  . olmesartan (BENICAR) 20 MG tablet  . pantoprazole (PROTONIX) 20 MG tablet  . Tofacitinib Citrate ER (XELJANZ XR) 22 MG TB24  . Vitamin D, Ergocalciferol, (DRISDOL) 1.25 MG (50000 UT) CAPS capsule  . VOLTAREN 1 % GEL   No current facility-administered medications for this encounter.      Maia Plan [facility] Pre-Surgical Testing [phone] 05/21/19  4:06 PM

## 2019-05-21 NOTE — Anesthesia Preprocedure Evaluation (Addendum)
Anesthesia Evaluation  Patient identified by MRN, date of birth, ID band Patient awake    Reviewed: Allergy & Precautions, NPO status , Patient's Chart, lab work & pertinent test results  History of Anesthesia Complications (+) PONV and history of anesthetic complications  Airway Mallampati: IV  TM Distance: >3 FB Neck ROM: Full    Dental  (+) Dental Advisory Given, Teeth Intact   Pulmonary sleep apnea and Continuous Positive Airway Pressure Ventilation , COPD, neg recent URI, former smoker,    breath sounds clear to auscultation       Cardiovascular hypertension, Pt. on medications and Pt. on home beta blockers  Rhythm:Regular     Neuro/Psych negative neurological ROS     GI/Hepatic hiatal hernia, GERD  ,  Endo/Other  Hypothyroidism   Renal/GU      Musculoskeletal  (+) Arthritis ,   Abdominal   Peds  Hematology  (+) anemia ,   Anesthesia Other Findings Two day Lexiscan stress test was performed, given BMI of 45. Stress EKG is non-diagnostic, as this is pharmacological stress test. Small area of mild decrease in tracer update in apical myocardium on stress images, and large area of moderate decrease in tracer uptake in inferior myocardium likely due to breast attenuation, with imaging performed in sitting position. All segments of left ventricle demonstrated normal wall motion and thickening. Stress LVEF calculated 41%, although visually appears normal. Intermediate risk study. Recommend clinical correlation.     Reproductive/Obstetrics                            Anesthesia Physical Anesthesia Plan  ASA: III  Anesthesia Plan: General   Post-op Pain Management:    Induction: Intravenous  PONV Risk Score and Plan: 4 or greater and Ondansetron, Dexamethasone, Propofol infusion and Scopolamine patch - Pre-op  Airway Management Planned: Oral ETT  Additional Equipment: None  Intra-op  Plan:   Post-operative Plan: Extubation in OR  Informed Consent: I have reviewed the patients History and Physical, chart, labs and discussed the procedure including the risks, benefits and alternatives for the proposed anesthesia with the patient or authorized representative who has indicated his/her understanding and acceptance.     Dental advisory given  Plan Discussed with: CRNA and Surgeon  Anesthesia Plan Comments: (See PAT note 05/20/2019, Konrad Felix, PA-C)       Anesthesia Quick Evaluation

## 2019-05-22 ENCOUNTER — Other Ambulatory Visit (HOSPITAL_COMMUNITY)
Admission: RE | Admit: 2019-05-22 | Discharge: 2019-05-22 | Disposition: A | Discharge status: Home / Self Care | Payer: 59 | Source: Ambulatory Visit | Attending: General Surgery | Admitting: General Surgery

## 2019-05-22 DIAGNOSIS — Z01812 Encounter for preprocedural laboratory examination: Secondary | ICD-10-CM | POA: Insufficient documentation

## 2019-05-22 DIAGNOSIS — Z20828 Contact with and (suspected) exposure to other viral communicable diseases: Secondary | ICD-10-CM | POA: Diagnosis not present

## 2019-05-23 ENCOUNTER — Other Ambulatory Visit: Payer: Self-pay | Admitting: Internal Medicine

## 2019-05-23 LAB — NOVEL CORONAVIRUS, NAA (HOSP ORDER, SEND-OUT TO REF LAB; TAT 18-24 HRS): SARS-CoV-2, NAA: NOT DETECTED

## 2019-05-25 NOTE — H&P (Signed)
Sherry Dennis Documented: 02/19/2019 9:21 AM Location: [facility] Patient #: [id number] DOB: 27-Aug-1960 Divorced / Language: Sherry Dennis / Race: White Female   History of Present Illness [name]. [name] MD; 02/19/2019 9:47 AM) The patient is a 58 year old female who presents for a bariatric surgery evaluation. She comes in for long-term follow-up regarding her weight, hypertension, dyslipidemia, obstructive sleep apnea on CPAP, COPD, prediabetes, rheumatoid arthritis and tobacco use. I initially met her in March of this year to discuss weight loss surgery. She denies any medical changes since she was initially seen. She denies any trips the emergency room or hospital. She does have a history of paroxysmal SVT as well. She denies any chest pain, chest pressure, angina. She does get some occasional shortness of breath on exertion. She takes a pill for reflux. She is using CPAP. She has cut back on smoking but is still smoking. She had a cigarette yesterday. She has had psychological evaluation and clearance for surgery. She had an upper GI which was unremarkable. She had a negative chest x-ray. Unfortunately she has not had her referral to cardiology or pulmonary yet  3/20  She is referred by Dr Silverio Lay for evaluation of weight loss surgery. She completed our seminar in person. She is not interested in the adjustable gastric band. Her goals with weight loss surgery are to improve her overall health and to be around long-term for her 7 grandchildren. Despite numerous attempts for sustained weight loss she has been unsuccessful. She has tried several prescription weight loss medications, working with her primary care physician, Atkins diet, and portion control-all without long-term success  Her comorbidities include hypertension, dyslipidemia, obstructive sleep apnea on CPAP, COPD, prediabetes, and rheumatoid arthritis  She does endorse some occasional chest tightness as well as  shortness of breath on exertion however she is a lifelong smoker. She denies any concomitant nausea & jaw pain with these episodes of chest tightness. She states that she had a cardiac ablation a few years ago for Wolff-Parkinson-White. She denies any orthopnea. She does use CPAP. She will have some occasional peripheral edema up to the shin level. She denies any personal or family history of blood clots. She doesn't really have heartburn. She states that she has an occasional sensation of something sticking when she is eating or drinking. That may occur once or twice a week. She states that she was told she had a hiatal hernia however she had a normal endoscopy 13 months ago. There is no mention of hiatal hernia or other abnormal pathology other than H. pylori positive on biopsy. She was treated. She takes MiraLAX daily. She denies any prior abdominal surgery. She denies any melena or hematochezia. She has had a colonoscopy as well. She denies any dysuria or hematuria. She has rheumatoid arthritis mainly involving her hands and ankle and is on 2 medications. She also has right knee pain as well. She has prediabetes. She takes medicine for hypothyroidism.  She smokes about one pack per day for the past 40 years. She is actively trying to stop. She denies any alcohol or drug use. She is a Librarian, academic for city housekeeping.  She had blood work in February of this year which revealed essentially normal comprehensive metabolic panel. ALT was mildly elevated at 31. In January she had a normal CBC. A1c was 5.9. In January she had a triglyceride level of 343, total cholesterol level 204, LDL 111, HDL 44. Her TSH was 9.14 in December. Her PCP is  managing this   Problem List/Past Medical [name]. Redmond Pulling, MD; 02/19/2019 9:48 AM) TOBACCO USE (Z72.0)  HYPERCHOLESTEREMIA (E78.00)  EXTERNAL HEMORRHOID, THROMBOSED (K64.5)  SEVERE OBESITY (E66.01)   Past Surgical History [name]. Redmond Pulling,  MD; 02/19/2019 9:48 AM) Breast Biopsy  Right. Colon Polyp Removal - Colonoscopy  Knee Surgery  Right. Shoulder Surgery  Right.  Diagnostic Studies History [name]. Redmond Pulling, MD; 02/19/2019 9:48 AM) Colonoscopy  1-5 years ago never Mammogram  1-3 years ago within last year Pap Smear  1-5 years ago  Allergies ([name], [place]; 02/19/2019 9:22 AM) Tamiflu *ANTIVIRALS*  Darvocet A500 *ANALGESICS - OPIOID*  Anaphylaxis. Darvon *ANALGESICS - OPIOID*  Anaphylaxis. Allergies Reconciled   Medication History ([name], RMA; 02/19/2019 9:22 AM) Anusol-HC (2.5% Cream, 1 (one) Rectal three times daily, as needed, Taken starting 08/14/2017) Active. Bisoprolol-hydroCHLOROthiazide (5-6.25MG Tablet, Oral) Active. Cyanocobalamin (1000MCG/ML Solution, Injection) Active. Topiramate (50MG Tablet, Oral) Active. Olmesartan Medoxomil (20MG Tablet, Oral) Active. Levothyroxine Sodium (125MCG Tablet, Oral) Active. Escitalopram Oxalate (20MG Tablet, Oral) Active. Synthroid (150MCG Tablet, Oral) Active. Lisinopril-Hydrochlorothiazide (20-12.5MG Tablet, Oral) Active. Leflunomide (20MG Tablet, Oral) Active. Medications Reconciled  Social History [name]. Redmond Pulling, MD; 02/19/2019 9:48 AM) Alcohol use  Occasional alcohol use, Remotely quit alcohol use. Caffeine use  Coffee, Tea. No drug use  Tobacco use  Current every day smoker, Former smoker.  Family History [name]. Redmond Pulling, MD; 02/19/2019 9:48 AM) Alcohol Abuse  Brother, Father. Breast Cancer  Family Members In General. Diabetes Mellitus  Father. Heart Disease  Family Members In General, Father, Mother. Hypertension  Father, Mother. Thyroid problems  Mother.  Pregnancy / Birth History [name]. Redmond Pulling, MD; 02/19/2019 9:48 AM) Age at menarche  47 years. Age of menopause  65-50 <45 Gravida  2 Irregular periods  Maternal age  56-20 34-25 Para  2  Other Problems [name]. Redmond Pulling, MD; 02/19/2019 9:48 AM) Back Pain   Bladder Problems  Hemorrhoids  Thyroid Cancer  CHRONIC OBSTRUCTIVE PULMONARY DISEASE, UNSPECIFIED COPD TYPE (J44.9)  RHEUMATOID ARTHRITIS INVOLVING BOTH HANDS WITH POSITIVE RHEUMATOID FACTOR (M05.741, M05.742)  ESSENTIAL HYPERTENSION (I10)  OBSTRUCTIVE SLEEP APNEA ON CPAP (G47.33)  GASTROESOPHAGEAL REFLUX DISEASE, ESOPHAGITIS PRESENCE NOT SPECIFIED (K21.9)  HYPOTHYROIDISM, ADULT (E03.9)     Review of Systems [name]. Aolani Piggott MD; 02/19/2019 9:47 AM) General Present- Fatigue and Weight Gain. Not Present- Appetite Loss, Chills, Fever, Night Sweats and Weight Loss. Skin Present- Dryness and Non-Healing Wounds. Not Present- Change in Wart/Mole, Hives, Jaundice, New Lesions, Rash and Ulcer. HEENT Present- Hoarseness, Seasonal Allergies, Sinus Pain and Wears glasses/contact lenses. Not Present- Earache, Hearing Loss, Nose Bleed, Oral Ulcers, Ringing in the Ears, Sore Throat, Visual Disturbances and Yellow Eyes. Respiratory Present- Snoring and Wheezing. Not Present- Bloody sputum, Chronic Cough and Difficulty Breathing. Breast Not Present- Breast Mass, Breast Pain, Nipple Discharge and Skin Changes. Cardiovascular Present- Palpitations and Swelling of Extremities. Not Present- Chest Pain, Difficulty Breathing Lying Down, Leg Cramps, Rapid Heart Rate and Shortness of Breath. Gastrointestinal Present- Constipation and Hemorrhoids. Not Present- Abdominal Pain, Bloating, Bloody Stool, Change in Bowel Habits, Chronic diarrhea, Difficulty Swallowing, Excessive gas, Gets full quickly at meals, Indigestion, Nausea, Rectal Pain and Vomiting. Female Genitourinary Present- Nocturia and Urgency. Not Present- Frequency, Painful Urination and Pelvic Pain. Musculoskeletal Present- Back Pain, Joint Pain, Joint Stiffness, Muscle Pain, Muscle Weakness and Swelling of Extremities. Neurological Present- Weakness. Not Present- Decreased Memory, Fainting, Headaches, Numbness, Seizures, Tingling, Tremor and Trouble  walking. Psychiatric Not Present- Anxiety, Bipolar, Change in Sleep Pattern, Depression, Fearful  and Frequent crying. Endocrine Present- Cold Intolerance, Excessive Hunger and Heat Intolerance. Not Present- Hair Changes, Hot flashes and New Diabetes. Hematology Present- Easy Bruising and Excessive bleeding. Not Present- Blood Thinners, Gland problems, HIV and Persistent Infections.  Vitals ([name] RMA; 02/19/2019 9:22 AM) 02/19/2019 9:22 AM Weight: 276.2 lb Height: 64in Body Surface Area: 2.24 m Body Mass Index: 47.41 kg/m  Temp.: 98.75F  Pulse: 82 (Regular)  BP: 132/84(Sitting, Left Arm, Standard)       Physical Exam [name]. Devera Englander MD; 02/19/2019 9:47 AM) General Mental Status-Alert. General Appearance-Consistent with stated age. Hydration-Well hydrated. Voice-Normal.  Head and Neck Head-normocephalic, atraumatic with no lesions or palpable masses. Trachea-midline. Thyroid Gland Characteristics - normal size and consistency.  Eye Eyeball - Bilateral-Extraocular movements intact. Sclera/Conjunctiva - Bilateral-No scleral icterus.  ENMT Ears -Note: nml ext ears.  Mouth and Throat -Note: lips intact.   Chest and Lung Exam Chest and lung exam reveals -quiet, even and easy respiratory effort with no use of accessory muscles and on auscultation, normal breath sounds, no adventitious sounds and normal vocal resonance. Inspection Chest Wall - Normal. Back - normal.  Breast - Did not examine.  Cardiovascular Cardiovascular examination reveals -normal heart sounds, regular rate and rhythm with no murmurs and normal pedal pulses bilaterally.  Abdomen Inspection Inspection of the abdomen reveals - No Hernias. Skin - Scar - no surgical scars. Palpation/Percussion Palpation and Percussion of the abdomen reveal - Soft, Non Tender, No Rebound tenderness, No Rigidity (guarding) and No hepatosplenomegaly. Auscultation Auscultation of  the abdomen reveals - Bowel sounds normal.  Peripheral Vascular Upper Extremity Palpation - Pulses bilaterally normal.  Neurologic Neurologic evaluation reveals -alert and oriented x 3 with no impairment of recent or remote memory. Mental Status-Normal.  Neuropsychiatric The patient's mood and affect are described as -normal. Judgment and Insight-insight is appropriate concerning matters relevant to self.  Musculoskeletal Normal Exam - Left-Upper Extremity Strength Normal and Lower Extremity Strength Normal. Normal Exam - Right-Upper Extremity Strength Normal and Lower Extremity Strength Normal. Note: some RA joints in hands   Lymphatic Head & Neck  General Head & Neck Lymphatics: Bilateral - Description - Normal. Axillary - Did not examine. Femoral & Inguinal - Did not examine.    Assessment & Plan [name]. Valley Ke MD; 02/19/2019 9:49 AM)  SEVERE OBESITY (E66.01) Impression: The patient meets weight loss surgery criteria. I think the patient would be an acceptable candidate for Laparoscopic vertical sleeve gastrectomy.  We reviewed her workup to date. We discussed her upper GI and chest x-ray. We discussed the importance of ongoing work on stopping smoking. We discussed how tobacco and nicotine use increases her risk of infection, leak, and blood clot formation.  We also discussed the rationale for her being evaluated by cardiology and pulmonary to make sure that we are not missing anything and for them to gauge her risk for surgery. We will refer her to pulmonary and cardiology  Current Plans Pt Education - [facility] Free Text Education/Instructions: discussed with patient and provided information.  HYPERCHOLESTEREMIA (E78.00)   TOBACCO USE (Z72.0) Impression: We discussed how it is very important for her to continue to work on stopping smoking. We discussed that smoking increases her risk of infection, leak, and blood clot formation. We also talked about the  general unhealthiness of it. She was given Neurosurgeon. I encouraged her to discuss it more with her primary care doctor. We will check a urine nicotine level prior to surgery   CHRONIC OBSTRUCTIVE  PULMONARY DISEASE, UNSPECIFIED COPD TYPE (J44.9) Impression: Given her chronic smoking and lifelong history of smoking we will ask for pulmonary consult   GASTROESOPHAGEAL REFLUX DISEASE, ESOPHAGITIS PRESENCE NOT SPECIFIED (K21.9) Impression: She does not have daily reflux. She states that she has said a few times a year and does not take medicine on a daily basis. However we did discuss that there is a possibility of reflux long-term after sleeve gastrectomy. But given her tobacco use and the possibility of relapse even after tobacco cessation I would recommend a sleeve gastrectomy over a gastric bypass in order to try to prevent marginal ulcer   HYPOTHYROIDISM, ADULT (E03.9)   OBSTRUCTIVE SLEEP APNEA ON CPAP (G47.33)   ESSENTIAL HYPERTENSION (I10)  Leighton Ruff. Redmond Pulling, MD, FACS General, Bariatric, & Minimally Invasive Surgery [facility], [place]

## 2019-05-26 ENCOUNTER — Encounter (HOSPITAL_COMMUNITY)
Admission: RE | Disposition: A | Discharge status: Home / Self Care | Payer: Self-pay | Source: Home / Self Care | Attending: General Surgery

## 2019-05-26 ENCOUNTER — Inpatient Hospital Stay (HOSPITAL_COMMUNITY)
Admission: RE | Admit: 2019-05-26 | Discharge: 2019-05-28 | DRG: 620 | Disposition: A | Discharge status: Home / Self Care | Payer: 59 | Attending: General Surgery | Admitting: General Surgery

## 2019-05-26 ENCOUNTER — Encounter (HOSPITAL_COMMUNITY): Payer: Self-pay | Admitting: Emergency Medicine

## 2019-05-26 ENCOUNTER — Other Ambulatory Visit: Payer: Self-pay

## 2019-05-26 ENCOUNTER — Telehealth (HOSPITAL_COMMUNITY): Payer: Self-pay | Admitting: *Deleted

## 2019-05-26 ENCOUNTER — Inpatient Hospital Stay (HOSPITAL_COMMUNITY): Payer: 59 | Admitting: Anesthesiology

## 2019-05-26 ENCOUNTER — Inpatient Hospital Stay (HOSPITAL_COMMUNITY): Payer: 59 | Admitting: Physician Assistant

## 2019-05-26 DIAGNOSIS — R739 Hyperglycemia, unspecified: Secondary | ICD-10-CM | POA: Diagnosis present

## 2019-05-26 DIAGNOSIS — Z79890 Hormone replacement therapy (postmenopausal): Secondary | ICD-10-CM | POA: Diagnosis not present

## 2019-05-26 DIAGNOSIS — R7303 Prediabetes: Secondary | ICD-10-CM | POA: Diagnosis present

## 2019-05-26 DIAGNOSIS — G4733 Obstructive sleep apnea (adult) (pediatric): Secondary | ICD-10-CM | POA: Diagnosis present

## 2019-05-26 DIAGNOSIS — F17210 Nicotine dependence, cigarettes, uncomplicated: Secondary | ICD-10-CM | POA: Diagnosis present

## 2019-05-26 DIAGNOSIS — Z9884 Bariatric surgery status: Secondary | ICD-10-CM

## 2019-05-26 DIAGNOSIS — E079 Disorder of thyroid, unspecified: Secondary | ICD-10-CM | POA: Diagnosis present

## 2019-05-26 DIAGNOSIS — Z6841 Body Mass Index (BMI) 40.0 and over, adult: Secondary | ICD-10-CM | POA: Diagnosis not present

## 2019-05-26 DIAGNOSIS — I10 Essential (primary) hypertension: Secondary | ICD-10-CM | POA: Diagnosis present

## 2019-05-26 DIAGNOSIS — Z9989 Dependence on other enabling machines and devices: Secondary | ICD-10-CM

## 2019-05-26 DIAGNOSIS — M069 Rheumatoid arthritis, unspecified: Secondary | ICD-10-CM | POA: Diagnosis present

## 2019-05-26 DIAGNOSIS — E039 Hypothyroidism, unspecified: Secondary | ICD-10-CM | POA: Diagnosis present

## 2019-05-26 DIAGNOSIS — K219 Gastro-esophageal reflux disease without esophagitis: Secondary | ICD-10-CM | POA: Diagnosis present

## 2019-05-26 DIAGNOSIS — J449 Chronic obstructive pulmonary disease, unspecified: Secondary | ICD-10-CM | POA: Diagnosis present

## 2019-05-26 DIAGNOSIS — K660 Peritoneal adhesions (postprocedural) (postinfection): Secondary | ICD-10-CM | POA: Diagnosis present

## 2019-05-26 DIAGNOSIS — E7800 Pure hypercholesterolemia, unspecified: Secondary | ICD-10-CM | POA: Diagnosis present

## 2019-05-26 DIAGNOSIS — E785 Hyperlipidemia, unspecified: Secondary | ICD-10-CM | POA: Diagnosis present

## 2019-05-26 DIAGNOSIS — Z79899 Other long term (current) drug therapy: Secondary | ICD-10-CM | POA: Diagnosis not present

## 2019-05-26 DIAGNOSIS — D62 Acute posthemorrhagic anemia: Secondary | ICD-10-CM | POA: Diagnosis not present

## 2019-05-26 DIAGNOSIS — Z85828 Personal history of other malignant neoplasm of skin: Secondary | ICD-10-CM | POA: Diagnosis not present

## 2019-05-26 HISTORY — PX: LAPAROSCOPIC GASTRIC SLEEVE RESECTION: SHX5895

## 2019-05-26 HISTORY — PX: SPLENECTOMY, TOTAL: SHX788

## 2019-05-26 LAB — TYPE AND SCREEN
ABO/RH(D): O POS
Antibody Screen: NEGATIVE

## 2019-05-26 LAB — GLUCOSE, CAPILLARY
Glucose-Capillary: 95 mg/dL (ref 70–99)
Glucose-Capillary: 189 mg/dL — ABNORMAL HIGH (ref 70–99)

## 2019-05-26 LAB — HEMOGLOBIN AND HEMATOCRIT, BLOOD
HCT: 33 % — ABNORMAL LOW (ref 36.0–46.0)
Hemoglobin: 10.1 g/dL — ABNORMAL LOW (ref 12.0–15.0)

## 2019-05-26 LAB — ABO/RH: ABO/RH(D): O POS

## 2019-05-26 LAB — PREGNANCY, URINE: Preg Test, Ur: NEGATIVE

## 2019-05-26 SURGERY — GASTRECTOMY, SLEEVE, LAPAROSCOPIC
Anesthesia: General

## 2019-05-26 MED ORDER — PROPOFOL 10 MG/ML IV BOLUS
INTRAVENOUS | Status: AC
  Filled 2019-05-26: qty 20

## 2019-05-26 MED ORDER — SODIUM CHLORIDE 0.9 % IV SOLN
2.0000 g | INTRAVENOUS | Status: AC
  Administered 2019-05-26: 2 g via INTRAVENOUS
  Filled 2019-05-26: qty 2

## 2019-05-26 MED ORDER — ONDANSETRON HCL 4 MG/2ML IJ SOLN
INTRAMUSCULAR | Status: DC | PRN
  Administered 2019-05-26: 4 mg via INTRAVENOUS

## 2019-05-26 MED ORDER — FENTANYL CITRATE (PF) 100 MCG/2ML IJ SOLN
INTRAMUSCULAR | Status: AC
  Filled 2019-05-26: qty 2

## 2019-05-26 MED ORDER — BISOPROLOL FUMARATE 5 MG PO TABS
5.0000 mg | ORAL_TABLET | Freq: Once | ORAL | Status: AC
  Administered 2019-05-26: 5 mg via ORAL
  Filled 2019-05-26: qty 1

## 2019-05-26 MED ORDER — ACETAMINOPHEN 500 MG PO TABS
1000.0000 mg | ORAL_TABLET | Freq: Three times a day (TID) | ORAL | Status: DC
  Administered 2019-05-26 – 2019-05-28 (×5): 1000 mg via ORAL
  Filled 2019-05-26 (×5): qty 2

## 2019-05-26 MED ORDER — 0.9 % SODIUM CHLORIDE (POUR BTL) OPTIME
TOPICAL | Status: DC | PRN
  Administered 2019-05-26: 3000 mL

## 2019-05-26 MED ORDER — ATROPINE SULFATE 0.4 MG/ML IV SOSY
PREFILLED_SYRINGE | INTRAVENOUS | Status: DC | PRN
  Administered 2019-05-26: .4 mg via INTRAVENOUS

## 2019-05-26 MED ORDER — ONDANSETRON HCL 4 MG/2ML IJ SOLN
4.0000 mg | Freq: Four times a day (QID) | INTRAMUSCULAR | Status: DC | PRN
  Administered 2019-05-27: 4 mg via INTRAVENOUS
  Filled 2019-05-26: qty 2

## 2019-05-26 MED ORDER — MORPHINE SULFATE (PF) 4 MG/ML IV SOLN
1.0000 mg | INTRAVENOUS | Status: DC | PRN
  Administered 2019-05-26 – 2019-05-27 (×3): 2 mg via INTRAVENOUS
  Administered 2019-05-27: 3 mg via INTRAVENOUS
  Administered 2019-05-27: 1 mg via INTRAVENOUS
  Administered 2019-05-27: 3 mg via INTRAVENOUS
  Administered 2019-05-27: 2 mg via INTRAVENOUS
  Filled 2019-05-26 (×8): qty 1

## 2019-05-26 MED ORDER — EPHEDRINE 5 MG/ML INJ
INTRAVENOUS | Status: AC
  Filled 2019-05-26: qty 10

## 2019-05-26 MED ORDER — OXYCODONE HCL 5 MG/5ML PO SOLN: 5.0000 mg | Freq: Once | ORAL | Status: DC | PRN

## 2019-05-26 MED ORDER — DEXAMETHASONE SODIUM PHOSPHATE 10 MG/ML IJ SOLN
INTRAMUSCULAR | Status: DC | PRN
  Administered 2019-05-26: 10 mg via INTRAVENOUS

## 2019-05-26 MED ORDER — SUGAMMADEX SODIUM 500 MG/5ML IV SOLN
INTRAVENOUS | Status: DC | PRN
  Administered 2019-05-26: 500 mg via INTRAVENOUS

## 2019-05-26 MED ORDER — CHLORHEXIDINE GLUCONATE 4 % EX LIQD: 60.0000 mL | Freq: Once | CUTANEOUS | Status: DC

## 2019-05-26 MED ORDER — EPHEDRINE SULFATE-NACL 50-0.9 MG/10ML-% IV SOSY
PREFILLED_SYRINGE | INTRAVENOUS | Status: DC | PRN
  Administered 2019-05-26 (×4): 10 mg via INTRAVENOUS

## 2019-05-26 MED ORDER — FENTANYL CITRATE (PF) 250 MCG/5ML IJ SOLN
INTRAMUSCULAR | Status: AC
  Filled 2019-05-26: qty 5

## 2019-05-26 MED ORDER — ACETAMINOPHEN 500 MG PO TABS
1000.0000 mg | ORAL_TABLET | ORAL | Status: AC
  Administered 2019-05-26: 1000 mg via ORAL
  Filled 2019-05-26: qty 2

## 2019-05-26 MED ORDER — ROCURONIUM BROMIDE 10 MG/ML (PF) SYRINGE
PREFILLED_SYRINGE | INTRAVENOUS | Status: DC | PRN
  Administered 2019-05-26: 10 mg via INTRAVENOUS
  Administered 2019-05-26: 20 mg via INTRAVENOUS
  Administered 2019-05-26: 30 mg via INTRAVENOUS
  Administered 2019-05-26: 40 mg via INTRAVENOUS

## 2019-05-26 MED ORDER — GABAPENTIN 100 MG PO CAPS
200.0000 mg | ORAL_CAPSULE | Freq: Two times a day (BID) | ORAL | Status: DC
  Administered 2019-05-26 – 2019-05-28 (×4): 200 mg via ORAL
  Filled 2019-05-26 (×4): qty 2

## 2019-05-26 MED ORDER — PANTOPRAZOLE SODIUM 40 MG IV SOLR
40.0000 mg | Freq: Every day | INTRAVENOUS | Status: DC
  Administered 2019-05-26 – 2019-05-27 (×2): 40 mg via INTRAVENOUS
  Filled 2019-05-26 (×2): qty 40

## 2019-05-26 MED ORDER — FENTANYL CITRATE (PF) 250 MCG/5ML IJ SOLN
INTRAMUSCULAR | Status: DC | PRN
  Administered 2019-05-26 (×2): 50 ug via INTRAVENOUS
  Administered 2019-05-26: 150 ug via INTRAVENOUS

## 2019-05-26 MED ORDER — LACTATED RINGERS IV SOLN
INTRAVENOUS | Status: DC | PRN
  Administered 2019-05-26: 16:00:00 via INTRAVENOUS

## 2019-05-26 MED ORDER — OXYCODONE HCL 5 MG/5ML PO SOLN
5.0000 mg | Freq: Four times a day (QID) | ORAL | Status: DC | PRN
  Administered 2019-05-27 – 2019-05-28 (×4): 5 mg via ORAL
  Filled 2019-05-26 (×4): qty 5

## 2019-05-26 MED ORDER — IRBESARTAN 300 MG PO TABS
300.0000 mg | ORAL_TABLET | Freq: Every day | ORAL | Status: DC
  Administered 2019-05-27 – 2019-05-28 (×2): 300 mg via ORAL
  Filled 2019-05-26 (×3): qty 1

## 2019-05-26 MED ORDER — SUCCINYLCHOLINE CHLORIDE 200 MG/10ML IV SOSY
PREFILLED_SYRINGE | INTRAVENOUS | Status: AC
  Filled 2019-05-26: qty 10

## 2019-05-26 MED ORDER — LIDOCAINE 2% (20 MG/ML) 5 ML SYRINGE
INTRAMUSCULAR | Status: AC
  Filled 2019-05-26: qty 5

## 2019-05-26 MED ORDER — FENTANYL CITRATE (PF) 100 MCG/2ML IJ SOLN
25.0000 ug | INTRAMUSCULAR | Status: DC | PRN
  Administered 2019-05-26 (×2): 50 ug via INTRAVENOUS

## 2019-05-26 MED ORDER — BUPIVACAINE LIPOSOME 1.3 % IJ SUSP
INTRAMUSCULAR | Status: DC | PRN
  Administered 2019-05-26: 20 mL

## 2019-05-26 MED ORDER — LIDOCAINE 2% (20 MG/ML) 5 ML SYRINGE
INTRAMUSCULAR | Status: AC
  Filled 2019-05-26: qty 10

## 2019-05-26 MED ORDER — ONDANSETRON HCL 4 MG/2ML IJ SOLN
INTRAMUSCULAR | Status: AC
  Filled 2019-05-26: qty 2

## 2019-05-26 MED ORDER — INSULIN ASPART 100 UNIT/ML ~~LOC~~ SOLN
0.0000 [IU] | SUBCUTANEOUS | Status: DC
  Administered 2019-05-26 – 2019-05-27 (×3): 4 [IU] via SUBCUTANEOUS
  Administered 2019-05-27: 7 [IU] via SUBCUTANEOUS
  Administered 2019-05-28 (×2): 3 [IU] via SUBCUTANEOUS

## 2019-05-26 MED ORDER — ENSURE MAX PROTEIN PO LIQD
2.0000 [oz_av] | ORAL | Status: DC
  Administered 2019-05-27 – 2019-05-28 (×14): 2 [oz_av] via ORAL
  Filled 2019-05-26 (×5): qty 330

## 2019-05-26 MED ORDER — LIDOCAINE 2% (20 MG/ML) 5 ML SYRINGE
INTRAMUSCULAR | Status: DC | PRN
  Administered 2019-05-26: 100 mg via INTRAVENOUS

## 2019-05-26 MED ORDER — ACETAMINOPHEN 160 MG/5ML PO SOLN
1000.0000 mg | Freq: Three times a day (TID) | ORAL | Status: DC
  Filled 2019-05-26: qty 40.6

## 2019-05-26 MED ORDER — SIMETHICONE 80 MG PO CHEW: 80.0000 mg | CHEWABLE_TABLET | Freq: Four times a day (QID) | ORAL | Status: DC | PRN

## 2019-05-26 MED ORDER — SUGAMMADEX SODIUM 500 MG/5ML IV SOLN
INTRAVENOUS | Status: AC
  Filled 2019-05-26: qty 5

## 2019-05-26 MED ORDER — OXYCODONE HCL 5 MG PO TABS: 5.0000 mg | ORAL_TABLET | Freq: Once | ORAL | Status: DC | PRN

## 2019-05-26 MED ORDER — ACETAMINOPHEN 10 MG/ML IV SOLN: 1000.0000 mg | Freq: Once | INTRAVENOUS | Status: DC | PRN

## 2019-05-26 MED ORDER — KCL IN DEXTROSE-NACL 20-5-0.45 MEQ/L-%-% IV SOLN
INTRAVENOUS | Status: DC
  Administered 2019-05-26 – 2019-05-27 (×2): via INTRAVENOUS
  Filled 2019-05-26 (×2): qty 1000

## 2019-05-26 MED ORDER — GABAPENTIN 300 MG PO CAPS
300.0000 mg | ORAL_CAPSULE | ORAL | Status: AC
  Administered 2019-05-26: 300 mg via ORAL
  Filled 2019-05-26: qty 1

## 2019-05-26 MED ORDER — ACETAMINOPHEN 500 MG PO TABS: 1000.0000 mg | ORAL_TABLET | Freq: Once | ORAL | Status: DC | PRN

## 2019-05-26 MED ORDER — HEPARIN SODIUM (PORCINE) 5000 UNIT/ML IJ SOLN
5000.0000 [IU] | INTRAMUSCULAR | Status: AC
  Administered 2019-05-26: 5000 [IU] via SUBCUTANEOUS
  Filled 2019-05-26: qty 1

## 2019-05-26 MED ORDER — ROCURONIUM BROMIDE 10 MG/ML (PF) SYRINGE
PREFILLED_SYRINGE | INTRAVENOUS | Status: AC
  Filled 2019-05-26: qty 10

## 2019-05-26 MED ORDER — LACTATED RINGERS IR SOLN
Status: DC | PRN
  Administered 2019-05-26: 2000 mL

## 2019-05-26 MED ORDER — FENTANYL CITRATE (PF) 100 MCG/2ML IJ SOLN
INTRAMUSCULAR | Status: AC
  Filled 2019-05-26: qty 4

## 2019-05-26 MED ORDER — BUPIVACAINE LIPOSOME 1.3 % IJ SUSP
20.0000 mL | Freq: Once | INTRAMUSCULAR | Status: DC
  Filled 2019-05-26: qty 20

## 2019-05-26 MED ORDER — CHLORHEXIDINE GLUCONATE CLOTH 2 % EX PADS
6.0000 | MEDICATED_PAD | Freq: Every day | CUTANEOUS | Status: DC
  Administered 2019-05-26 – 2019-05-27 (×2): 6 via TOPICAL

## 2019-05-26 MED ORDER — DEXAMETHASONE SODIUM PHOSPHATE 10 MG/ML IJ SOLN
INTRAMUSCULAR | Status: AC
  Filled 2019-05-26: qty 1

## 2019-05-26 MED ORDER — APREPITANT 40 MG PO CAPS
40.0000 mg | ORAL_CAPSULE | ORAL | Status: AC
  Administered 2019-05-26: 40 mg via ORAL
  Filled 2019-05-26: qty 1

## 2019-05-26 MED ORDER — SUCCINYLCHOLINE CHLORIDE 200 MG/10ML IV SOSY
PREFILLED_SYRINGE | INTRAVENOUS | Status: DC | PRN
  Administered 2019-05-26: 120 mg via INTRAVENOUS

## 2019-05-26 MED ORDER — MIDAZOLAM HCL 2 MG/2ML IJ SOLN
INTRAMUSCULAR | Status: DC | PRN
  Administered 2019-05-26: 2 mg via INTRAVENOUS

## 2019-05-26 MED ORDER — ACETAMINOPHEN 160 MG/5ML PO SOLN: 1000.0000 mg | Freq: Once | ORAL | Status: DC | PRN

## 2019-05-26 MED ORDER — LACTATED RINGERS IV SOLN
INTRAVENOUS | Status: DC
  Administered 2019-05-26 (×2): via INTRAVENOUS

## 2019-05-26 MED ORDER — MIDAZOLAM HCL 2 MG/2ML IJ SOLN
INTRAMUSCULAR | Status: AC
  Filled 2019-05-26: qty 2

## 2019-05-26 MED ORDER — METOPROLOL TARTRATE 5 MG/5ML IV SOLN
5.0000 mg | Freq: Four times a day (QID) | INTRAVENOUS | Status: DC | PRN
  Administered 2019-05-26: 5 mg via INTRAVENOUS
  Filled 2019-05-26 (×2): qty 5

## 2019-05-26 MED ORDER — DEXAMETHASONE SODIUM PHOSPHATE 4 MG/ML IJ SOLN: 4.0000 mg | INTRAMUSCULAR | Status: DC

## 2019-05-26 MED ORDER — SODIUM CHLORIDE (PF) 0.9 % IJ SOLN
INTRAMUSCULAR | Status: AC
  Filled 2019-05-26: qty 50

## 2019-05-26 MED ORDER — LEVOTHYROXINE SODIUM 25 MCG PO TABS
137.0000 ug | ORAL_TABLET | Freq: Every day | ORAL | Status: DC
  Administered 2019-05-27 – 2019-05-28 (×2): 137 ug via ORAL
  Filled 2019-05-26 (×2): qty 1

## 2019-05-26 MED ORDER — BISOPROLOL-HYDROCHLOROTHIAZIDE 5-6.25 MG PO TABS
1.0000 | ORAL_TABLET | Freq: Every day | ORAL | Status: DC
  Administered 2019-05-27 – 2019-05-28 (×2): 1 via ORAL
  Filled 2019-05-26 (×2): qty 1

## 2019-05-26 MED ORDER — ENOXAPARIN SODIUM 30 MG/0.3ML ~~LOC~~ SOLN: 30.0000 mg | Freq: Two times a day (BID) | SUBCUTANEOUS | Status: DC

## 2019-05-26 MED ORDER — LEFLUNOMIDE 20 MG PO TABS
20.0000 mg | ORAL_TABLET | Freq: Every day | ORAL | Status: DC
  Administered 2019-05-27 – 2019-05-28 (×2): 20 mg via ORAL
  Filled 2019-05-26 (×2): qty 1

## 2019-05-26 MED ORDER — PROPOFOL 10 MG/ML IV BOLUS
INTRAVENOUS | Status: DC | PRN
  Administered 2019-05-26: 160 mg via INTRAVENOUS

## 2019-05-26 MED ORDER — SCOPOLAMINE 1 MG/3DAYS TD PT72
1.0000 | MEDICATED_PATCH | TRANSDERMAL | Status: DC
  Administered 2019-05-26: 1.5 mg via TRANSDERMAL
  Filled 2019-05-26: qty 1

## 2019-05-26 MED ORDER — LIDOCAINE 2% (20 MG/ML) 5 ML SYRINGE
INTRAMUSCULAR | Status: DC | PRN
  Administered 2019-05-26: 1.5 mg/kg/h via INTRAVENOUS

## 2019-05-26 MED ORDER — SODIUM CHLORIDE (PF) 0.9 % IJ SOLN
INTRAMUSCULAR | Status: DC | PRN
  Administered 2019-05-26: 50 mL

## 2019-05-26 MED ORDER — KETAMINE HCL 10 MG/ML IJ SOLN
INTRAMUSCULAR | Status: DC | PRN
  Administered 2019-05-26: 70 mg via INTRAVENOUS

## 2019-05-26 SURGICAL SUPPLY — 98 items
APL PRP STRL LF DISP 70% ISPRP (MISCELLANEOUS) ×4
APL SKNCLS STERI-STRIP NONHPOA (GAUZE/BANDAGES/DRESSINGS) ×2
APL SRG 32X5 SNPLK LF DISP (MISCELLANEOUS)
APL SWBSTK 6 STRL LF DISP (MISCELLANEOUS)
APPLICATOR COTTON TIP 6 STRL (MISCELLANEOUS) IMPLANT
APPLICATOR COTTON TIP 6IN STRL (MISCELLANEOUS) IMPLANT
APPLIER CLIP ROT 10 11.4 M/L (STAPLE)
APPLIER CLIP ROT 13.4 12 LRG (CLIP) ×3
APR CLP LRG 13.4X12 ROT 20 MLT (CLIP) ×2
APR CLP MED LRG 11.4X10 (STAPLE)
BENZOIN TINCTURE PRP APPL 2/3 (GAUZE/BANDAGES/DRESSINGS) ×3 IMPLANT
BLADE SURG SZ11 CARB STEEL (BLADE) ×3 IMPLANT
BNDG ADH 1X3 SHEER STRL LF (GAUZE/BANDAGES/DRESSINGS) ×18 IMPLANT
BNDG ADH THN 3X1 STRL LF (GAUZE/BANDAGES/DRESSINGS) ×12
CABLE HIGH FREQUENCY MONO STRZ (ELECTRODE) ×2 IMPLANT
CHLORAPREP W/TINT 26 (MISCELLANEOUS) ×6 IMPLANT
CLIP APPLIE ROT 10 11.4 M/L (STAPLE) IMPLANT
CLIP APPLIE ROT 13.4 12 LRG (CLIP) IMPLANT
COVER MAYO STAND STRL (DRAPES) ×1 IMPLANT
COVER SURGICAL LIGHT HANDLE (MISCELLANEOUS) ×3 IMPLANT
COVER WAND RF STERILE (DRAPES) IMPLANT
DECANTER SPIKE VIAL GLASS SM (MISCELLANEOUS) ×3 IMPLANT
DEVICE SUT QUICK LOAD TK 5 (STAPLE) IMPLANT
DEVICE SUT TI-KNOT TK 5X26 (MISCELLANEOUS) IMPLANT
DEVICE SUTURE ENDOST 10MM (ENDOMECHANICALS) IMPLANT
DISSECTOR BLUNT TIP ENDO 5MM (MISCELLANEOUS) IMPLANT
DRAIN CHANNEL 19F RND (DRAIN) ×1 IMPLANT
DRAPE UTILITY XL STRL (DRAPES) ×6 IMPLANT
DRAPE WARM FLUID 44X44 (DRAPES) ×1 IMPLANT
DRSG OPSITE POSTOP 4X10 (GAUZE/BANDAGES/DRESSINGS) ×1 IMPLANT
DRSG OPSITE POSTOP 4X8 (GAUZE/BANDAGES/DRESSINGS) IMPLANT
DRSG TEGADERM 2-3/8X2-3/4 SM (GAUZE/BANDAGES/DRESSINGS) ×4 IMPLANT
DRSG TEGADERM 4X4.75 (GAUZE/BANDAGES/DRESSINGS) ×1 IMPLANT
ELECT L-HOOK LAP 45CM DISP (ELECTROSURGICAL)
ELECT PENCIL ROCKER SW 15FT (MISCELLANEOUS) IMPLANT
ELECT REM PT RETURN 15FT ADLT (MISCELLANEOUS) ×3 IMPLANT
ELECTRODE L-HOOK LAP 45CM DISP (ELECTROSURGICAL) IMPLANT
EVACUATOR SILICONE 100CC (DRAIN) ×1 IMPLANT
GAUZE SPONGE 2X2 8PLY STRL LF (GAUZE/BANDAGES/DRESSINGS) IMPLANT
GAUZE SPONGE 4X4 12PLY STRL (GAUZE/BANDAGES/DRESSINGS) ×1 IMPLANT
GLOVE BIO SURGEON STRL SZ7.5 (GLOVE) ×3 IMPLANT
GLOVE INDICATOR 8.0 STRL GRN (GLOVE) ×3 IMPLANT
GOWN STRL REUS W/TWL XL LVL3 (GOWN DISPOSABLE) ×9 IMPLANT
GRASPER SUT TROCAR 14GX15 (MISCELLANEOUS) ×3 IMPLANT
HANDLE SUCTION POOLE (INSTRUMENTS) IMPLANT
HOVERMATT SINGLE USE (MISCELLANEOUS) ×3 IMPLANT
KIT BASIN OR (CUSTOM PROCEDURE TRAY) ×3 IMPLANT
KIT TURNOVER KIT A (KITS) IMPLANT
MARKER SKIN DUAL TIP RULER LAB (MISCELLANEOUS) ×3 IMPLANT
NDL SPNL 22GX3.5 QUINCKE BK (NEEDLE) ×2 IMPLANT
NEEDLE SPNL 22GX3.5 QUINCKE BK (NEEDLE) ×3 IMPLANT
PACK UNIVERSAL I (CUSTOM PROCEDURE TRAY) ×3 IMPLANT
PENCIL SMOKE EVACUATOR (MISCELLANEOUS) IMPLANT
RELOAD STAPLE 60 3.6 BLU REG (STAPLE) ×2 IMPLANT
RELOAD STAPLE 60 3.8 GOLD REG (STAPLE) IMPLANT
RELOAD STAPLE 60 4.1 GRN THCK (STAPLE) ×2 IMPLANT
RELOAD STAPLE 60 BLK VRY/THCK (STAPLE) IMPLANT
RELOAD STAPLER 60MM BLK (STAPLE) IMPLANT
RELOAD STAPLER BLUE 60MM (STAPLE) ×6 IMPLANT
RELOAD STAPLER GOLD 60MM (STAPLE) IMPLANT
RELOAD STAPLER GREEN 60MM (STAPLE) ×4 IMPLANT
SCISSORS LAP 5X45 EPIX DISP (ENDOMECHANICALS) IMPLANT
SEALANT SURGICAL APPL DUAL CAN (MISCELLANEOUS) IMPLANT
SET IRRIG TUBING LAPAROSCOPIC (IRRIGATION / IRRIGATOR) ×3 IMPLANT
SET TUBE SMOKE EVAC HIGH FLOW (TUBING) ×3 IMPLANT
SHEARS HARMONIC ACE PLUS 45CM (MISCELLANEOUS) ×3 IMPLANT
SLEEVE GASTRECTOMY 40FR VISIGI (MISCELLANEOUS) ×3 IMPLANT
SLEEVE XCEL OPT CAN 5 100 (ENDOMECHANICALS) ×9 IMPLANT
SOL ANTI FOG 6CC (MISCELLANEOUS) ×2 IMPLANT
SOLUTION ANTI FOG 6CC (MISCELLANEOUS) ×1
SPONGE GAUZE 2X2 STER 10/PKG (GAUZE/BANDAGES/DRESSINGS)
SPONGE LAP 18X18 RF (DISPOSABLE) ×3 IMPLANT
STAPLER ECHELON BIOABSB 60 FLE (MISCELLANEOUS) ×13 IMPLANT
STAPLER ECHELON LONG 60 440 (INSTRUMENTS) ×3 IMPLANT
STAPLER RELOAD 60MM BLK (STAPLE)
STAPLER RELOAD BLUE 60MM (STAPLE) ×9
STAPLER RELOAD GOLD 60MM (STAPLE)
STAPLER RELOAD GREEN 60MM (STAPLE) ×6
STRIP CLOSURE SKIN 1/2X4 (GAUZE/BANDAGES/DRESSINGS) ×3 IMPLANT
SUCTION POOLE HANDLE (INSTRUMENTS) ×3
SUT ETHILON 2 0 PS N (SUTURE) ×1 IMPLANT
SUT MNCRL AB 4-0 PS2 18 (SUTURE) ×5 IMPLANT
SUT PDS AB 0 CT 36 (SUTURE) ×4 IMPLANT
SUT PDS AB 0 CTX 60 (SUTURE) ×1 IMPLANT
SUT SILK 2 0 (SUTURE) ×3
SUT SILK 2 0SH CR/8 30 (SUTURE) ×1 IMPLANT
SUT SILK 2-0 30XBRD TIE 12 (SUTURE) IMPLANT
SUT SILK 3 0 12 30 (SUTURE) ×1 IMPLANT
SUT SURGIDAC NAB ES-9 0 48 120 (SUTURE) IMPLANT
SUT VICRYL 0 TIES 12 18 (SUTURE) ×3 IMPLANT
SYR 20ML LL LF (SYRINGE) ×3 IMPLANT
SYR 50ML LL SCALE MARK (SYRINGE) ×3 IMPLANT
TOWEL OR 17X26 10 PK STRL BLUE (TOWEL DISPOSABLE) ×3 IMPLANT
TOWEL OR NON WOVEN STRL DISP B (DISPOSABLE) ×3 IMPLANT
TROCAR BLADELESS 15MM (ENDOMECHANICALS) ×3 IMPLANT
TROCAR BLADELESS OPT 5 100 (ENDOMECHANICALS) ×3 IMPLANT
TUBING CONNECTING 10 (TUBING) ×5 IMPLANT
TUBING ENDO SMARTCAP (MISCELLANEOUS) ×3 IMPLANT

## 2019-05-26 NOTE — Progress Notes (Signed)
PHARMACY CONSULT FOR:  Risk Assessment for Post-Discharge VTE Following Bariatric Surgery  Post-Discharge VTE Risk Assessment: This patient's probability of 30-day post-discharge VTE is increased due to the factors marked:   Female    Age >/=60 years    BMI >/=50 kg/m2    CHF    Dyspnea at Rest    Paraplegia   X Non-gastric-band surgery    Operation Time >/=3 hr    Return to OR     Length of Stay >/= 3 d      Hx of VTE   Hypercoagulable condition   Significant venous stasis   Predicted probability of 30-day post-discharge VTE: 0.16%  Other patient-specific factors to consider: n/a   Recommendation for Discharge: No pharmacologic prophylaxis post-discharge   Sherry Dennis is a 58 y.o. female who underwent  sleeve gastrectomy on 05/26/19.   Case start: 1502 Case end: 1732   Allergies  Allergen Reactions  . Darvon [Propoxyphene Hcl] Anaphylaxis and Other (See Comments)    Airway swelling   . Propoxyphene Anaphylaxis    Throat closes up  . Doxycycline Nausea Only  . Tamiflu [Oseltamivir Phosphate] Hives and Other (See Comments)    Increased blood pressure    Patient Measurements: Height: 5\' 5"  (165.1 cm) Weight: 281 lb 9.6 oz (127.7 kg) IBW/kg (Calculated) : 57 Body mass index is 46.86 kg/m.  Recent Labs    05/26/19 1832  HGB 10.1*  HCT 33.0*   Estimated Creatinine Clearance: 96.1 mL/min (by C-G formula based on SCr of 0.88 mg/dL).    Past Medical History:  Diagnosis Date  . Allergy   . Anemia   . Arthritis    "knees, hands, ankles, ~ every joint" (04/08/2015)  . Basal cell carcinoma    "face, hands, chest" (04/08/2015)  . Chronic bronchitis ([place])    "get it pretty much q yr" (04/08/2015)  . Dysrhythmia    palpitations occ; SVT 09/2013 s/p adenosine  . GERD (gastroesophageal reflux disease)   . H/O hiatal hernia   . Hx of cardiovascular stress test    Lexiscan Myoview 6/16: Ejection fraction is 60% and wall motion is normal. The study is normal.  There is no scar or ischemia. This is a low risk scan.  . Hyperlipidemia   . Hypertension   . Hypothyroidism   . Palpitations   . Pneumonia 2015 X 1  . PONV (postoperative nausea and vomiting)   . Pre-diabetes   . S/P total knee arthroplasty 02/15/2014  . Sleep apnea   . Thyroid disease   . Vitamin D deficiency      Medications Prior to Admission  Medication Sig Dispense Refill Last Dose  . acetaminophen (TYLENOL) 650 MG CR tablet Take 1,300 mg by mouth 2 (two) times daily as needed for pain.   05/25/2019 at Unknown time  . bisoprolol-hydrochlorothiazide (ZIAC) 5-6.25 MG tablet Take 1 tablet by mouth daily. 90 tablet 1 05/25/2019 at 0630  . cyanocobalamin (,VITAMIN B-12,) 1000 MCG/ML injection Inject 1 mL (1,000 mcg total) into the skin every 30 (thirty) days. 30 mL 2 05/23/2019  . diphenhydrAMINE (BENADRYL) 25 MG tablet Take 25 mg by mouth daily.    05/25/2019 at Unknown time  . escitalopram (LEXAPRO) 20 MG tablet TAKE 1 TABLET BY MOUTH EVERY DAY 30 tablet 5 05/25/2019 at Unknown time  . furosemide (LASIX) 40 MG tablet Take 1 tablet (40 mg total) by mouth daily. 90 tablet 3 05/25/2019 at Unknown time  . leflunomide (ARAVA) 20 MG tablet  Take 20 mg by mouth daily.   05/26/2019 at 0830  . levothyroxine (SYNTHROID) 137 MCG tablet Take one tablet by mouth every morning on empty stomach with water, 16min prior to eating.  Wait 4 hours for antacids. 30 tablet 2 05/25/2019 at Unknown time  . olmesartan (BENICAR) 20 MG tablet TAKE 2 TABLETS (40MG ) BY MOUTH DAILY (Patient taking differently: Take 40 mg by mouth daily. ) 60 tablet 5 05/25/2019 at Unknown time  . pantoprazole (PROTONIX) 20 MG tablet Take 1 tablet Daily for Indigestion & Heartburn 90 tablet 3 05/26/2019 at 0830  . Vitamin D, Ergocalciferol, (DRISDOL) 1.25 MG (50000 UT) CAPS capsule 1 pill 3 days a week for vitamin d deficiency 36 capsule 1 05/23/2019  . morphine (MSIR) 15 MG tablet Take 15 mg by mouth 3 (three) times daily as needed  for severe pain.    More than a month at Unknown time  . Tofacitinib Citrate ER (XELJANZ XR) 22 MG TB24 Take 22 mg by mouth daily.   05/23/2019  . VOLTAREN 1 % GEL Apply 2 g topically 4 (four) times daily as needed (joint pain). 100 g 2 More than a month at Unknown time       Lynelle Doctor 05/26/2019,7:03 PM

## 2019-05-26 NOTE — Transfer of Care (Signed)
Immediate Anesthesia Transfer of Care Note  Patient: Sherry Dennis  Procedure(s) Performed: LAPAROSCOPIC GASTRIC SLEEVE RESECTION, Upper Endo, ERAS Pathway (N/A ) SPLENECTOMY  Patient Location: PACU  Anesthesia Type:General  Level of Consciousness: awake, alert  and patient cooperative  Airway & Oxygen Therapy: Patient Spontanous Breathing and Patient connected to face mask oxygen  Post-op Assessment: Report given to RN and Post -op Vital signs reviewed and stable  Post vital signs: Reviewed and stable  Last Vitals:  Vitals Value Taken Time  BP 166/97 05/26/19 1801  Temp 36.9 C 05/26/19 1801  Pulse 67 05/26/19 1811  Resp 21 05/26/19 1811  SpO2 100 % 05/26/19 1811  Vitals shown include unvalidated device data.  Last Pain:  Vitals:   05/26/19 1801  TempSrc:   PainSc: 0-No pain      Patients Stated Pain Goal: 4 ([id number])  Complications: No apparent anesthesia complications

## 2019-05-26 NOTE — Op Note (Signed)
Preoperative diagnosis: laparoscopic sleeve gastrectomy  Postoperative diagnosis: Same   Procedure: Upper endoscopy   Surgeon: Luke Kinsinger, M.D.  Anesthesia: Gen.   Indications for procedure: This patient was undergoing a laparoscopic sleeve gastrectomy.   Description of procedure: The endoscopy was placed in the mouth and into the oropharynx and under endoscopic vision it was advanced to the esophagogastric junction. The pouch was insufflated and no bleeding or bubbles were seen. The GEJ was identified at 37cm from the teeth. No bleeding or leaks were detected. The scope was withdrawn without difficulty.   Luke Kinsinger, M.D. General, Bariatric, & Minimally Invasive Surgery [facility]    

## 2019-05-26 NOTE — H&P (Signed)
Sherry Dennis is an 58 y.o. female.   Chief Complaint: here for surgery HPI: 58 year old female presents for sleeve gastrectomy surgery.  She denies any medical changes since I last saw her in the clinic back in early July.  After that visit she did undergo evaluation by pulmonary as well as cardiology.  She ended up having a cardiac stress test.  It was found to be a low risk scan.  Pulmonary did not have any objections to undergoing weight loss surgery.  The patient has stopped smoking.  She had a negative urine nicotine test.  She states that she has refrain from smoking  The patient is a 58 year old female who presents for a bariatric surgery evaluation. She comes in for long-term follow-up regarding her weight, hypertension, dyslipidemia, obstructive sleep apnea on CPAP, COPD, prediabetes, rheumatoid arthritis and tobacco use. I initially met her in March of this year to discuss weight loss surgery. She denies any medical changes since she was initially seen. She denies any trips the emergency room or hospital. She does have a history of paroxysmal SVT as well. She denies any chest pain, chest pressure, angina. She does get some occasional shortness of breath on exertion. She takes a pill for reflux. She is using CPAP. She has cut back on smoking but is still smoking. She had a cigarette yesterday. She has had psychological evaluation and clearance for surgery. She had an upper GI which was unremarkable. She had a negative chest x-ray. Unfortunately she has not had her referral to cardiology or pulmonary yet  3/20  She is referred by Dr Silverio Lay for evaluation of weight loss surgery. She completed our seminar in person. She is not interested in the adjustable gastric band. Her goals with weight loss surgery are to improve her overall health and to be around long-term for her 7 grandchildren. Despite numerous attempts for sustained weight loss she has been unsuccessful. She has tried  several prescription weight loss medications, working with her primary care physician, Atkins diet, and portion control-all without long-term success  Her comorbidities include hypertension, dyslipidemia, obstructive sleep apnea on CPAP, COPD, prediabetes, and rheumatoid arthritis  She does endorse some occasional chest tightness as well as shortness of breath on exertion however she is a lifelong smoker. She denies any concomitant nausea & jaw pain with these episodes of chest tightness. She states that she had a cardiac ablation a few years ago for Wolff-Parkinson-White. She denies any orthopnea. She does use CPAP. She will have some occasional peripheral edema up to the shin level. She denies any personal or family history of blood clots. She doesn't really have heartburn. She states that she has an occasional sensation of something sticking when she is eating or drinking. That may occur once or twice a week. She states that she was told she had a hiatal hernia however she had a normal endoscopy 13 months ago. There is no mention of hiatal hernia or other abnormal pathology other than H. pylori positive on biopsy. She was treated. She takes MiraLAX daily. She denies any prior abdominal surgery. She denies any melena or hematochezia. She has had a colonoscopy as well. She denies any dysuria or hematuria. She has rheumatoid arthritis mainly involving her hands and ankle and is on 2 medications. She also has right knee pain as well. She has prediabetes. She takes medicine for hypothyroidism.  She smokes about one pack per day for the past 40 years. She is actively trying to stop.  She denies any alcohol or drug use. She is a Librarian, academic for city housekeeping.  She had blood work in February of this year which revealed essentially normal comprehensive metabolic panel. ALT was mildly elevated at 31. In January she had a normal CBC. A1c was 5.9. In January she had a triglyceride  level of 343, total cholesterol level 204, LDL 111, HDL 44. Her TSH was 9.14 in December. Her PCP is managing this  Past Medical History:  Diagnosis Date  . Allergy   . Anemia   . Arthritis    "knees, hands, ankles, ~ every joint" (04/08/2015)  . Basal cell carcinoma    "face, hands, chest" (04/08/2015)  . Chronic bronchitis ([place])    "get it pretty much q yr" (04/08/2015)  . Dysrhythmia    palpitations occ; SVT 09/2013 s/p adenosine  . GERD (gastroesophageal reflux disease)   . H/O hiatal hernia   . Hx of cardiovascular stress test    Lexiscan Myoview 6/16: Ejection fraction is 60% and wall motion is normal. The study is normal. There is no scar or ischemia. This is a low risk scan.  . Hyperlipidemia   . Hypertension   . Hypothyroidism   . Palpitations   . Pneumonia 2015 X 1  . PONV (postoperative nausea and vomiting)   . Pre-diabetes   . S/P total knee arthroplasty 02/15/2014  . Sleep apnea   . Thyroid disease   . Vitamin D deficiency     Past Surgical History:  Procedure Laterality Date  . BASAL CELL CARCINOMA EXCISION  2014   "off my chest"  . BLADDER REPAIR  1993   "lasered the holes shut"  . CARDIAC CATHETERIZATION  2017  . CARDIAC SURGERY    . CARPAL TUNNEL RELEASE Right 1990  . COLONOSCOPY    . COLONOSCOPY WITH PROPOFOL N/A 11/25/2017   Procedure: COLONOSCOPY WITH PROPOFOL;  Surgeon: Mauri Pole, MD;  Location: [facility];  Service: Endoscopy;  Laterality: N/A;  . ELECTROPHYSIOLOGIC STUDY N/A 04/08/2015   Procedure: SVT Ablation;  Surgeon: Evans Lance, MD;  Location: [facility];  Service: Cardiovascular;  Laterality: N/A;  . ENDOMETRIAL ABLATION  2009  . INCONTINENCE SURGERY  2010  . JOINT REPLACEMENT    . KNEE ARTHROSCOPY Right 2013  . KNEE ARTHROSCOPY    . PARTIAL KNEE ARTHROPLASTY Right 02/15/2014   Procedure: RIGHT UNICOMPARTMENTAL KNEE (MEDIAL COMPARTMENT);  Surgeon: Vickey Huger, MD;  Location: [facility];  Service: Orthopedics;  Laterality:  Right;  . POLYPECTOMY    . TUBAL LIGATION  1987    Family History  Problem Relation Age of Onset  . Hypertension Mother   . Atrial fibrillation Mother   . Diabetes Father   . Emphysema Father   . COPD Father   . Breast cancer Paternal Aunt        x 4 aunts,   . Stroke Maternal Grandmother   . Heart attack Maternal Grandmother   . Hyperlipidemia Maternal Grandfather   . Heart disease Maternal Grandfather   . Hyperlipidemia Paternal Grandmother   . Heart disease Paternal Grandmother   . Hyperlipidemia Paternal Grandfather   . Heart disease Paternal Grandfather   . Colon cancer Neg Hx   . Colon polyps Neg Hx   . Esophageal cancer Neg Hx   . Rectal cancer Neg Hx   . Ulcerative colitis Neg Hx   . Stomach cancer Neg Hx    Social History:  reports that she quit smoking about 3  months ago. Her smoking use included cigarettes. She has a 60.00 pack-year smoking history. She has never used smokeless tobacco. She reports that she does not drink alcohol or use drugs.  Allergies:  Allergies  Allergen Reactions  . Darvocet [Propoxyphene N-Acetaminophen] Anaphylaxis  . Darvon [Propoxyphene Hcl] Anaphylaxis and Other (See Comments)    Airway swelling   . Propoxyphene Anaphylaxis    Throat closes up  . Doxycycline Nausea Only  . Tamiflu [Oseltamivir Phosphate] Hives and Other (See Comments)    Increased blood pressure    Medications Prior to Admission  Medication Sig Dispense Refill  . acetaminophen (TYLENOL) 650 MG CR tablet Take 1,300 mg by mouth 2 (two) times daily as needed for pain.    . bisoprolol-hydrochlorothiazide (ZIAC) 5-6.25 MG tablet Take 1 tablet by mouth daily. 90 tablet 1  . cyanocobalamin (,VITAMIN B-12,) 1000 MCG/ML injection Inject 1 mL (1,000 mcg total) into the skin every 30 (thirty) days. 30 mL 2  . diphenhydrAMINE (BENADRYL) 25 MG tablet Take 25 mg by mouth daily.     Marland Kitchen escitalopram (LEXAPRO) 20 MG tablet TAKE 1 TABLET BY MOUTH EVERY DAY 30 tablet 5  .  furosemide (LASIX) 40 MG tablet Take 1 tablet (40 mg total) by mouth daily. 90 tablet 3  . leflunomide (ARAVA) 20 MG tablet Take 20 mg by mouth daily.    Marland Kitchen levothyroxine (SYNTHROID) 137 MCG tablet Take one tablet by mouth every morning on empty stomach with water, 26mn prior to eating.  Wait 4 hours for antacids. 30 tablet 2  . olmesartan (BENICAR) 20 MG tablet TAKE 2 TABLETS ('40MG'$ ) BY MOUTH DAILY (Patient taking differently: Take 40 mg by mouth daily. ) 60 tablet 5  . pantoprazole (PROTONIX) 20 MG tablet Take 1 tablet Daily for Indigestion & Heartburn 90 tablet 3  . Vitamin D, Ergocalciferol, (DRISDOL) 1.25 MG (50000 UT) CAPS capsule 1 pill 3 days a week for vitamin d deficiency 36 capsule 1  . morphine (MSIR) 15 MG tablet Take 15 mg by mouth 3 (three) times daily as needed for severe pain.     . Tofacitinib Citrate ER (XELJANZ XR) 22 MG TB24 Take 22 mg by mouth daily.    . VOLTAREN 1 % GEL Apply 2 g topically 4 (four) times daily as needed (joint pain). 100 g 2    Results for orders placed or performed during the hospital encounter of 05/26/19 (from the past 48 hour(s))  Pregnancy, urine     Status: None   Collection Time: 05/26/19 11:47 AM  Result Value Ref Range   Preg Test, Ur NEGATIVE NEGATIVE    Comment:        THE SENSITIVITY OF THIS METHODOLOGY IS >20 mIU/mL. [facility] [id number] [place] [address]  Glucose, capillary     Status: None   Collection Time: 05/26/19 11:54 AM  Result Value Ref Range   Glucose-Capillary 95 70 - 99 mg/dL   Comment 1 Notify RN    Comment 2 Document in Chart   Type and screen [facility]    Status: None (Preliminary result)   Collection Time: 05/26/19 12:00 PM  Result Value Ref Range   ABO/RH(D) O POS    Antibody Screen PENDING    Sample Expiration      05/29/2019,2359 [facility]formed [facility] [id number], [place] [address]   No results  found.  Review of Systems  All  other systems reviewed and are negative.   Blood pressure (!) 177/100, pulse 82, temperature 98.4 F (36.9 C), temperature source Oral, resp. rate 18, height 5' 5"  (1.651 m), weight 127.7 kg, SpO2 97 %. Physical Exam  Vitals reviewed. Constitutional: She is oriented to person, place, and time. She appears well-developed and well-nourished. No distress.  Severe obesity, central  HENT:  Head: Normocephalic and atraumatic.  Right Ear: External ear normal.  Left Ear: External ear normal.  Eyes: Conjunctivae are normal. No scleral icterus.  Neck: Normal range of motion. Neck supple. No tracheal deviation present. No thyromegaly present.  Cardiovascular: Normal rate and normal heart sounds.  Respiratory: Effort normal and breath sounds normal. No stridor. No respiratory distress. She has no wheezes.  Musculoskeletal:        General: No tenderness or edema.  Lymphadenopathy:    She has no cervical adenopathy.  Neurological: She is alert and oriented to person, place, and time. She exhibits normal muscle tone.  Skin: Skin is warm and dry. No rash noted. She is not diaphoretic. No erythema. No pallor.  Psychiatric: She has a normal mood and affect. Her behavior is normal. Judgment and thought content normal.     Assessment/Plan Severe obesity Obstructive sleep apnea on CPAP Prediabetes Dyslipidemia Hypertension Former tobacco use  2 OR for laparoscopic sleeve gastrectomy.  All questions asked and answered.  E rasp protocol  Leighton Ruff. Redmond Pulling, MD, FACS General, Bariatric, & Minimally Invasive Surgery [facility], [place]   Greer Pickerel, MD 05/26/2019, 2:02 PM

## 2019-05-26 NOTE — Anesthesia Procedure Notes (Signed)
Procedure Name: Intubation Date/Time: 05/26/2019 2:45 PM Performed by: Sharlette Dense, CRNA Patient Re-evaluated:Patient Re-evaluated prior to induction Oxygen Delivery Method: Circle system utilized Preoxygenation: Pre-oxygenation with 100% oxygen Induction Type: IV induction and Rapid sequence Laryngoscope Size: Miller and 2 Grade View: Grade I Tube type: Oral Tube size: 7.5 mm Number of attempts: 1 Airway Equipment and Method: Stylet Placement Confirmation: ETT inserted through vocal cords under direct vision,  positive ETCO2 and breath sounds checked- equal and bilateral Secured at: 22 cm Tube secured with: Tape Dental Injury: Teeth and Oropharynx as per pre-operative assessment

## 2019-05-26 NOTE — Op Note (Signed)
05/26/2019 Ellyn Hack June 03, 1961 [id number]   PRE-OPERATIVE DIAGNOSIS:     Morbid obesity BMI 47   Hypertension   Hyperlipidemia   Thyroid disease   Rheumatoid arthritis ([place])   COPD GOLD 0/ former smoker at risk   OSA on CPAP    POST-OPERATIVE DIAGNOSIS:  Same + intra-abdominal adhesions, splenic capsule injury  PROCEDURE:  Procedure(s): LAPAROSCOPIC SLEEVE GASTRECTOMY  UPPER GI ENDOSCOPY SPLENECTOMY  SURGEON:  Surgeon(s): Gayland Curry, MD FACS FASMBS  ASSISTANTS: Gurney Maxin, MD FACS; Autumn Messing MD FACS  ANESTHESIA:   general  DRAINS: none   BOUGIE: 40 fr ViSiGi  LOCAL MEDICATIONS USED:   Exparel  EBL: 200 cc  SPECIMEN:  Source of Specimen:  Greater curvature of stomach & spleen  DISPOSITION OF SPECIMEN:  PATHOLOGY  COUNTS:  YES  INDICATION FOR PROCEDURE: This is a very pleasant 58 y.o.-year-old morbidly obese female who has had unsuccessful attempts for sustained weight loss. The patient presents today for a planned laparoscopic sleeve gastrectomy with upper endoscopy. We have discussed the risk and benefits of the procedure extensively preoperatively. Please see my separate notes.  PROCEDURE: After obtaining informed consent and receiving 5000 units of subcutaneous heparin, the patient was brought to the operating room at [facility] and placed supine on the operating room table. General endotracheal anesthesia was established. Sequential compression devices were placed. A orogastric tube was placed. The patient's abdomen was prepped and draped in the usual standard surgical fashion. The patient received preoperative IV antibiotic. A surgical timeout was performed. ERAS protocol used.   Access to the abdomen was achieved using a 5 mm 0 laparoscope thru a 5 mm trocar In the left upper Quadrant 2 fingerbreadths below the left subcostal margin using the Optiview technique. Pneumoperitoneum was smoothly established up to 15 mm of mercury. The laparoscope was  advanced and the abdominal cavity was surveilled.  There were a fair amount of omental adhesions to the anterior bowel wall mainly in the upper abdomen in both the left upper quadrant midline and right upper quadrant.  Interesting the patient had only had had a prior tubal ligation as her prior abdominal surgery.  I placed a 5 Miller trocar in the left lateral abdominal wall under direct visualization.  Then using harmonic scalpel I was able to take down the omental adhesions from the anterior abdominal wall.  This took about 10 minutes to free all the omental adhesions from the anterior abdominal wall.  The patient was then placed in reverse Trendelenburg.   A 5 mm trocar was placed slightly above and to the left of the umbilicus under direct visualization.  The [facility] liver retractor was placed under the left lobe of the liver through a 5 mm trocar incision site in the subxiphoid position. A 5 mm trocar was placed in the lateral right upper quadrant along with a 15 mm trocar in the mid right abdomen.  All under direct visualization after exparel had been infiltrated in bilateral lateral upper abdominal walls as a TAP block.  The stomach was inspected. It was completely decompressed and the orogastric tube was removed.  There was no anterior dimple that was obviously visible.  Her preoperative upper GI showed no hiatal hernia.    The patient had a fair amount of visceral fat.  There was a large esophageal fat pad.  We identified the pylorus and measured 6 cm proximal to the pylorus and identified an area of where we would start taking down the short  gastric vessels. Harmonic scalpel was used to take down the short gastric vessels along the greater curvature of the stomach. We were able to enter the lesser sac. We continued to march along the greater curvature of the stomach taking down the short gastrics. As we approached the gastrosplenic ligament we took care in this area not to injure the spleen.   There is a fair amount of omentum and adipose tissue in the left upper quadrant we were able to take down the entire gastrosplenic ligament. We then mobilized the fundus away from the left crus of diaphragm. There were fair amount of posterior gastric avascular attachments.  These were taken down with harmonic scalpel.  This left the stomach completely mobilized. No vessels had been taken down along the lesser curvature of the stomach.  We then reidentified the pylorus. A 40Fr ViSiGi was then placed in the oropharynx and advanced down into the stomach and placed in the distal antrum and positioned along the lesser curvature. It was placed under suction which secured the 40Fr ViSiGi in place along the lesser curve. Then using the Ethicon echelon 60 mm stapler with a green load with Seamguard, I placed a stapler along the antrum approximately 5 cm from the pylorus. The stapler was angled so that there is ample room at the angularis incisura. I then fired the first staple load after inspecting it posteriorly to ensure adequate space both anteriorly and posteriorly. At this point I still was not completely past the angularis so with another green load with Seamguard, I placed the stapler in position just inside the prior stapleline. We then rotated the stomach to insure that there was adequate anteriorly as well as posteriorly. The stapler was then fired.  At this point I started using 60 mm blue load staple cartridges with Seamguard. The echelon stapler was then repositioned with a 60 mm blue load with Seamguard and we continued to march up along the [place]. My assistant was holding traction along the greater curvature stomach along the cauterized short gastric vessels ensuring that the stomach was symmetrically retracted. Prior to each firing of the staple, we rotated the stomach to ensure that there is adequate stomach left.  As we approached the fundus, I used 60 mm blue cartridge with Seamguard aiming  lateral to  the GE junction after mobilizing some of the esophageal fat pad.  The sleeve was inspected. There is no evidence of cork screw. The staple line appeared hemostatic. The CRNA inflated the ViSiGi to the green zone and the upper abdomen was flooded with saline. There were no bubbles. The sleeve was decompressed and the ViSiGi removed. My assistant scrubbed out and performed an upper endoscopy. The sleeve easily distended with air and the scope was easily advanced to the pylorus. There is no evidence of internal bleeding or cork screwing. There was no narrowing at the angularis. There is no evidence of bubbles. Please see his operative note for further details. The gastric sleeve was decompressed and the endoscope was removed.  I inspected the left upper quadrant.  And irrigated this area.  There appeared to be some bleeding in the left upper quadrant.  I thought it might be originating from some short gastrics that had been harmonic.  I was convinced there is some bleeding in the left upper quadrant but could not identify it myself.  So I had my assistant scrubbed back in.  With his assistance in retraction of the adipose tissue in the left upper quadrant it  was obvious that there had been a splenic capsule tear along the upper pole of the spleen.  This was not an unknown potential complication.  The capsule tear was probably about 5 cm long.  There is no pulsatile bleeding.  There is just ongoing using from the splenic parenchyma.  I decided that it could not be repaired and that a splenectomy would need to be performed.  We did this much mobilization of the inferior pole and lateral attachments of the spleen as possible laparoscopically.  Just as in other parts of her abdomen there is a fair amount of adhesions along the inferior pole the spleen and medially.  We identified the transverse colon and the splenic flexure.  It appeared to be intimately adhered.  I felt we had done as much as we could safely  laparoscopically.  At this point I decided to convert to open.  A left upper quadrant subcostal incision made with a 10 blade through the previously placed left midclavicular 5 Miller trocar and extended laterally.  The liver retractor had been removed prior to converting to open.  Subcutaneous tissue was divided with electrocautery.  Anterior fascia was divided with electrocautery.  Posterior fascia was divided electrocautery and abdominal cavity was entered.  There is no evidence of injury to surrounding structures.  A Balfour retractor was placed.  We ended up enlarging the fascial incision slightly laterally.  We were able to pull down on the spleen and delivered into the field.  2 Kelly clamps were placed along the splenic hilum just next to the spleen.  It was then sharply excised with a pair of Mayo scissors.  Dr. Marlou Starks had scrubbed and at this point to help with retraction.  I doubly tied off the splenic artery and vein that had been clamped with a Kelly clamp with a 2-0 silk tie x2.  The other Claiborne Billings was also tied off but did not appear to have any major vessels within it.  There is a small superior pole of the spleen that was still attached.  This was clamped and sharply excised and then that was tied off with a 2-0 silk suture as well.  We irrigated the left upper quadrant with 2 L of saline.  There is no evidence of bleeding.  There was good hemostasis.  We inspected the splenic flexure.  There is no evidence of colonic injury.  I did place a 19 round Blake drain in the left upper quadrant and brought it out through the left lower quadrant trocar site and secured it to the skin with a 2-0 nylon.  The sleeve specimen had been removed from the abdomen.  I then infiltrated Exparel Marcaine mixture in the left upper quadrant trocar incision.  We then closed the left upper quadrant open incision in 2 layers with 0 PDS x4 (2 sutures for each layer).  Obtains tissue was irrigated.  Skin was reapproximated with  skin staples.  Other trocar sites were closed with skin staples.  Honeycomb dressing was applied to left upper quadrant incision.  Other wounds were dressed with 2 x 2's and Tegaderm.   The patient was extubated and taken to the recovery room in stable condition. All needle, instrument, and sponge counts were correct x2.   I updated the patient's son via telephone at the end of the procedure and discussed with him the intraoperative conversion to open and splenectomy  (2) 60 mm green with Seamguard  (4) 60 mm blue with 1 seamguard  PLAN OF CARE: Admit to inpatient   PATIENT DISPOSITION:  PACU - hemodynamically stable.   Delay start of Pharmacological VTE agent (>24hrs) due to surgical blood loss or risk of bleeding:  perhaps  Leighton Ruff. Redmond Pulling, MD, FACS FASMBS General, Bariatric, & Minimally Invasive Surgery [facility], [place]

## 2019-05-27 ENCOUNTER — Encounter (HOSPITAL_COMMUNITY): Payer: Self-pay | Admitting: General Surgery

## 2019-05-27 LAB — CBC WITH DIFFERENTIAL/PLATELET
Abs Immature Granulocytes: 0.05 10*3/uL (ref 0.00–0.07)
Basophils Absolute: 0 10*3/uL (ref 0.0–0.1)
Basophils Relative: 0 %
Eosinophils Absolute: 0 10*3/uL (ref 0.0–0.5)
Eosinophils Relative: 0 %
HCT: 30.6 % — ABNORMAL LOW (ref 36.0–46.0)
Hemoglobin: 9.2 g/dL — ABNORMAL LOW (ref 12.0–15.0)
Immature Granulocytes: 1 %
Lymphocytes Relative: 4 %
Lymphs Abs: 0.5 10*3/uL — ABNORMAL LOW (ref 0.7–4.0)
MCH: 31.7 pg (ref 26.0–34.0)
MCHC: 30.1 g/dL (ref 30.0–36.0)
MCV: 105.5 fL — ABNORMAL HIGH (ref 80.0–100.0)
Monocytes Absolute: 0.6 10*3/uL (ref 0.1–1.0)
Monocytes Relative: 6 %
Neutro Abs: 9.6 10*3/uL — ABNORMAL HIGH (ref 1.7–7.7)
Neutrophils Relative %: 89 %
Platelets: 223 10*3/uL (ref 150–400)
RBC: 2.9 MIL/uL — ABNORMAL LOW (ref 3.87–5.11)
RDW: 13.7 % (ref 11.5–15.5)
WBC: 10.7 10*3/uL — ABNORMAL HIGH (ref 4.0–10.5)
nRBC: 0 % (ref 0.0–0.2)

## 2019-05-27 LAB — COMPREHENSIVE METABOLIC PANEL
ALT: 116 U/L — ABNORMAL HIGH (ref 0–44)
AST: 93 U/L — ABNORMAL HIGH (ref 15–41)
Albumin: 2.9 g/dL — ABNORMAL LOW (ref 3.5–5.0)
Alkaline Phosphatase: 66 U/L (ref 38–126)
Anion gap: 9 (ref 5–15)
BUN: 18 mg/dL (ref 6–20)
CO2: 23 mmol/L (ref 22–32)
Calcium: 8.1 mg/dL — ABNORMAL LOW (ref 8.9–10.3)
Chloride: 104 mmol/L (ref 98–111)
Creatinine, Ser: 0.9 mg/dL (ref 0.44–1.00)
GFR calc Af Amer: >60 mL/min (ref >60)
GFR calc non Af Amer: >60 mL/min (ref >60)
Glucose, Bld: 201 mg/dL — ABNORMAL HIGH (ref 70–99)
Potassium: 4.1 mmol/L (ref 3.5–5.1)
Sodium: 136 mmol/L (ref 135–145)
Total Bilirubin: 0.8 mg/dL (ref 0.3–1.2)
Total Protein: 5.9 g/dL — ABNORMAL LOW (ref 6.5–8.1)

## 2019-05-27 LAB — GLUCOSE, CAPILLARY
Glucose-Capillary: 113 mg/dL — ABNORMAL HIGH (ref 70–99)
Glucose-Capillary: 114 mg/dL — ABNORMAL HIGH (ref 70–99)
Glucose-Capillary: 117 mg/dL — ABNORMAL HIGH (ref 70–99)
Glucose-Capillary: 164 mg/dL — ABNORMAL HIGH (ref 70–99)
Glucose-Capillary: 173 mg/dL — ABNORMAL HIGH (ref 70–99)
Glucose-Capillary: 221 mg/dL — ABNORMAL HIGH (ref 70–99)

## 2019-05-27 LAB — HEMOGLOBIN AND HEMATOCRIT, BLOOD
HCT: 30.8 % — ABNORMAL LOW (ref 36.0–46.0)
Hemoglobin: 9.7 g/dL — ABNORMAL LOW (ref 12.0–15.0)

## 2019-05-27 MED ORDER — POTASSIUM CHLORIDE IN NACL 20-0.45 MEQ/L-% IV SOLN
INTRAVENOUS | Status: DC
  Administered 2019-05-27 (×2): via INTRAVENOUS
  Filled 2019-05-27 (×3): qty 1000

## 2019-05-27 MED ORDER — ENOXAPARIN SODIUM 30 MG/0.3ML ~~LOC~~ SOLN
30.0000 mg | Freq: Two times a day (BID) | SUBCUTANEOUS | Status: DC
  Administered 2019-05-27 – 2019-05-28 (×2): 30 mg via SUBCUTANEOUS
  Filled 2019-05-27 (×2): qty 0.3

## 2019-05-27 MED ORDER — HYDRALAZINE HCL 20 MG/ML IJ SOLN
10.0000 mg | INTRAMUSCULAR | Status: DC | PRN
  Administered 2019-05-27: 10 mg via INTRAVENOUS
  Filled 2019-05-27: qty 1

## 2019-05-27 MED ORDER — ORAL CARE MOUTH RINSE
15.0000 mL | Freq: Two times a day (BID) | OROMUCOSAL | Status: DC
  Administered 2019-05-27 – 2019-05-28 (×3): 15 mL via OROMUCOSAL

## 2019-05-27 NOTE — Progress Notes (Signed)
PT demonstrated hands on and verbal understanding of Flutter device. NPC at this time. PT has good strong cough.

## 2019-05-27 NOTE — Progress Notes (Signed)
Brief Pharmacy note:  Assessment and Plan: 58 yo s/p laparoscopic gastric sleeve resection and splenectomy.  Pharmacy consulted for recommended vaccines and dates.   Per pt, she has had hepatitis B vaccine as an adult.  She has had the flu shot in September of this year.  No drug interactions noted with any of pts medications   Week of 06/08/19:  pneumocococcal 13-valent conjugate vaccine (Prevnar 13) 0.25ml IM x 1  Meningococcal oligosaccharide (Menveo) 0.80ml IM x 1  Week of 08/03/19:  pneumocococcal 23 valent  vaccine (PNU-IMMUNE) 0.6ml IM x 1  Meningococcal oligosaccharide (Menveo) 0.28ml IM x 1  Meningococcal B (Bexsero) 0.62ml IM x 1  Week of 08/31/2019  Meningococcal B (Bexsero) 0.55ml IM x 1  5 years after surgery/splenectomy  pneumocococcal 23 valent  vaccine (Pnu-immune) 0.32ml IM x 1  Meningococcal oligosaccharide (Menveo) 0.57ml IM x 1   Dolly Rias RPh 05/27/2019, 1:08 PM Pager [phone]

## 2019-05-27 NOTE — Progress Notes (Signed)
1 Day Post-Op   Subjective/Chief Complaint: Sore in LUQ  Min nausea Had HTN issues overnight.    Objective: Vital signs in last 24 hours: Temp:  [97.6 F (36.4 C)-98.5 F (36.9 C)] 97.6 F (36.4 C) (10/14 0400) Pulse Rate:  [54-82] 61 (10/14 0630) Resp:  [13-25] 19 (10/14 0630) BP: (133-195)/(74-107) 133/74 (10/14 0630) SpO2:  [94 %-100 %] 97 % (10/14 0630) Weight:  [127.7 kg] 127.7 kg (10/13 1158) Last BM Date: 05/26/19  Intake/Output from previous day: 10/13 0701 - 10/14 0700 In: 3632.8 [I.V.:3632.8] Out: 1020 [Urine:700; Drains:120; Blood:200] Intake/Output this shift: No intake/output data recorded.  Alert, nad, nontoxic cta Reg Soft, obese, dressings c/d/i; approp TTP, drain-serosang No edema, +SCDs  Lab Results:  Recent Labs    05/26/19 1832 05/27/19 0205  WBC  --  10.7*  HGB 10.1* 9.2*  HCT 33.0* 30.6*  PLT  --  223   BMET Recent Labs    05/27/19 0205  NA 136  K 4.1  CL 104  CO2 23  GLUCOSE 201*  BUN 18  CREATININE 0.90  CALCIUM 8.1*   PT/INR No results for input(s): LABPROT, INR in the last 72 hours. ABG No results for input(s): PHART, HCO3 in the last 72 hours.  Invalid input(s): PCO2, PO2  Studies/Results: No results found.  Anti-infectives: Anti-infectives (From admission, onward)   Start     Dose/Rate Route Frequency Ordered Stop   05/26/19 1245  cefoTEtan (CEFOTAN) 2 g in sodium chloride 0.9 % 100 mL IVPB     2 g 200 mL/hr over 30 Minutes Intravenous On call to O.R. 05/26/19 1230 05/26/19 1457      Assessment/Plan: s/p Procedure(s): LAPAROSCOPIC GASTRIC SLEEVE RESECTION, Upper Endo, ERAS Pathway (N/A) SPLENECTOMY  Discussed intraop events with pt and rationale for splenectomy HTN - believe blood pressure will improve. All home bp meds are ordered  ABL anemia - drop in hgb 1 point from postop to this am. Will repeat this pm. If stable, will start postop chemical vte prophylaxis  GI- cont postop bari diet  FEN - change  mivf - remove dextrose given hyperglycemia, pt has prediabetes, cont SSI; lytes ok.   Pulm - is, flutter valve, OOB, ambulate  VTE prophy- scds for now, see above.   Splenectomy - will consult pharm to discuss timing of post splenectomy vaccines and see if any contraindications given her RA meds.   dispo - not ready for dc today, need to make sure not bleeding; tx to floor  Leighton Ruff. Redmond Pulling, MD, FACS General, Bariatric, & Minimally Invasive Surgery [facility], [place]   LOS: 1 day    Greer Pickerel 05/27/2019

## 2019-05-27 NOTE — Progress Notes (Signed)
[facility] made aware of on-going high blood pressures through-out the night. Gurney Maxin MD ordered Hydralazine IV 10 mg [id number] PRN for systolic < 0000000.

## 2019-05-28 LAB — SURGICAL PATHOLOGY

## 2019-05-28 LAB — CBC WITH DIFFERENTIAL/PLATELET
Abs Immature Granulocytes: 0.09 10*3/uL — ABNORMAL HIGH (ref 0.00–0.07)
Basophils Absolute: 0 10*3/uL (ref 0.0–0.1)
Basophils Relative: 0 %
Eosinophils Absolute: 0 10*3/uL (ref 0.0–0.5)
Eosinophils Relative: 0 %
HCT: 30.4 % — ABNORMAL LOW (ref 36.0–46.0)
Hemoglobin: 9.4 g/dL — ABNORMAL LOW (ref 12.0–15.0)
Immature Granulocytes: 1 %
Lymphocytes Relative: 8 %
Lymphs Abs: 1.2 10*3/uL (ref 0.7–4.0)
MCH: 32.4 pg (ref 26.0–34.0)
MCHC: 30.9 g/dL (ref 30.0–36.0)
MCV: 104.8 fL — ABNORMAL HIGH (ref 80.0–100.0)
Monocytes Absolute: 1.6 10*3/uL — ABNORMAL HIGH (ref 0.1–1.0)
Monocytes Relative: 11 %
Neutro Abs: 12.4 10*3/uL — ABNORMAL HIGH (ref 1.7–7.7)
Neutrophils Relative %: 80 %
Platelets: 218 10*3/uL (ref 150–400)
RBC: 2.9 MIL/uL — ABNORMAL LOW (ref 3.87–5.11)
RDW: 13.7 % (ref 11.5–15.5)
WBC: 15.3 10*3/uL — ABNORMAL HIGH (ref 4.0–10.5)
nRBC: 0.2 % (ref 0.0–0.2)

## 2019-05-28 LAB — GLUCOSE, CAPILLARY
Glucose-Capillary: 112 mg/dL — ABNORMAL HIGH (ref 70–99)
Glucose-Capillary: 115 mg/dL — ABNORMAL HIGH (ref 70–99)
Glucose-Capillary: 120 mg/dL — ABNORMAL HIGH (ref 70–99)
Glucose-Capillary: 124 mg/dL — ABNORMAL HIGH (ref 70–99)
Glucose-Capillary: 135 mg/dL — ABNORMAL HIGH (ref 70–99)

## 2019-05-28 MED ORDER — ONDANSETRON 4 MG PO TBDP: 4.0000 mg | ORAL_TABLET | Freq: Four times a day (QID) | ORAL | 0 refills | Status: DC | PRN

## 2019-05-28 MED ORDER — ACETAMINOPHEN 500 MG PO TABS: 1000.0000 mg | ORAL_TABLET | Freq: Three times a day (TID) | ORAL | 0 refills | Status: AC

## 2019-05-28 MED ORDER — GABAPENTIN 100 MG PO CAPS: 200.0000 mg | ORAL_CAPSULE | Freq: Two times a day (BID) | ORAL | 0 refills | Status: DC

## 2019-05-28 NOTE — Progress Notes (Signed)
Discharge instructions given to patient by Bariatric nurse. Patient had no questions. Writer educated patient on how to empty her JP drain and change the dressing

## 2019-05-28 NOTE — Discharge Instructions (Signed)
Surgical [facility] Surgical drains are used to remove extra fluid that normally builds up in a surgical wound after surgery. A surgical drain helps to heal a surgical wound. Different kinds of surgical drains include:  Active drains. These drains use suction to pull drainage away from the surgical wound. Drainage flows through a tube to a container outside of the body. With these drains, you need to keep the bulb or the drainage container flat (compressed) at all times, except while you empty it. Flattening the bulb or container creates suction.  Passive drains. These drains allow fluid to drain naturally, by gravity. Drainage flows through a tube to a bandage (dressing) or a container outside of the body. Passive drains do not need to be emptied. A drain is placed during surgery. Right after surgery, drainage is usually bright red and a little thicker than water. The drainage may gradually turn yellow or pink and become thinner. It is likely that your health care provider will remove the drain when the drainage stops or when the amount decreases to 1-2 Tbsp (15-30 mL) during a 24-hour period. Supplies needed:  Tape.  Germ-free cleaning solution (sterile saline).  Cotton swabs.  Split gauze drain sponge: 4 x 4 inches (10 x 10 cm).  Gauze square: 4 x 4 inches (10 x 10 cm). How to care for your surgical drain Care for your drain as told by your health care provider. This is important to help prevent infection. If your drain is placed at your back, or any other hard-to-reach area, ask another person to assist you in performing the following tasks: General care  Keep the skin around the drain dry and covered with a dressing at all times.  Check your drain area every day for signs of infection. Check for: ? Redness, swelling, or pain. ? Pus or a bad smell. ? Cloudy drainage. ? Tenderness or pressure at the drain exit site. Changing the dressing Follow instructions from your health care  provider about how to change your dressing. Change your dressing at least once a day. Change it more often if needed to keep the dressing dry. Make sure you: 1. Gather your supplies. 2. Wash your hands with soap and water before you change your dressing. If soap and water are not available, use hand sanitizer. 3. Remove the old dressing. Avoid using scissors to do that. 4. Wash your hands with soap and water again after removing the old dressing. 5. Use sterile saline to clean your skin around the drain. You may need to use a cotton swab to clean the skin. 6. Place the tube through the slit in a drain sponge. Place the drain sponge so that it covers your wound. 7. Place the gauze square or another drain sponge on top of the drain sponge that is on the wound. Make sure the tube is between those layers. 8. Tape the dressing to your skin. 9. Tape the drainage tube to your skin 1-2 inches (2.5-5 cm) below the place where the tube enters your body. Taping keeps the tube from pulling on any stitches (sutures) that you have. 10. Wash your hands with soap and water. 11. Write down the color of your drainage and how often you change your dressing. How to empty your active drain  1. Make sure that you have a measuring cup that you can empty your drainage into. 2. Wash your hands with soap and water. If soap and water are not available, use hand sanitizer. 3.  Loosen any pins or clips that hold the tube in place. 4. If your health care provider tells you to strip the tube to prevent clots and tube blockages: ? Hold the tube at the skin with one hand. Use your other hand to pinch the tubing with your thumb and first finger. ? Gently move your fingers down the tube while squeezing very lightly. This clears any drainage, clots, or tissue from the tube. ? You may need to do this several times each day to keep the tube clear. Do not pull on the tube. 5. Open the bulb cap or the drain plug. Do not touch the  inside of the cap or the bottom of the plug. 6. Turn the device upside down and gently squeeze. 7. Empty all of the drainage into the measuring cup. 8. Compress the bulb or the container and replace the cap or the plug. To compress the bulb or the container, squeeze it firmly in the middle while you close the cap or plug the container. 9. Write down the amount of drainage that you have in each 24-hour period. If you have less than 2 Tbsp (30 mL) of drainage during 24 hours, contact your health care provider. 10. Flush the drainage down the toilet. 11. Wash your hands with soap and water. Contact a health care provider if:  You have redness, swelling, or pain around your drain area.  You have pus or a bad smell coming from your drain area.  You have a fever or chills.  The skin around your drain is warm to the touch.  The amount of drainage that you have is increasing instead of decreasing.  You have drainage that is cloudy.  There is a sudden stop or a sudden decrease in the amount of drainage that you have.  Your drain tube falls out.  Your active drain does not stay compressed after you empty it. Summary  Surgical drains are used to remove extra fluid that normally builds up in a surgical wound after surgery.  Different kinds of surgical drains include active drains and passive drains. Active drains use suction to pull drainage away from the surgical wound, and passive drains allow fluid to drain naturally.  It is important to care for your drain to prevent infection. If your drain is placed at your back, or any other hard-to-reach area, ask another person to assist you.  Contact your health care provider if you have redness, swelling, or pain around your drain area. This information is not intended to replace advice given to you by your health care provider. Make sure you discuss any questions you have with your health care provider. Document Released: 07/27/2000 Document  Revised: 09/03/2018 Document Reviewed: 09/03/2018 Elsevier Patient Education  2020 [place] Record Empty your surgical drain as told by your health care provider. Use this form to write down the amount of fluid that has collected in the drainage container. Bring this form with you to your follow-up visits. Surgical drain #1 location: ___________________  Date __________ Time __________ Amount __________ Date __________ Time __________ Amount __________ Date __________ Time __________ Amount __________ Date __________ Time __________ Amount __________ Date __________ Time __________ Amount __________ Date __________ Time __________ Amount __________ Date __________ Time __________ Amount __________ Date __________ Time __________ Amount __________ Date __________ Time __________ Amount __________ Date __________ Time __________ Amount __________ Date __________ Time __________ Amount __________ Date __________ Time __________ Amount __________ Date __________ Time __________ Amount __________ Date __________  Time __________ Amount __________ Date __________ Time __________ Amount __________ Date __________ Time __________ Amount __________ Date __________ Time __________ Amount __________ Date __________ Time __________ Amount __________ Date __________ Time __________ Amount __________ Date __________ Time __________ Amount __________ Date __________ Time __________ Amount __________ Surgical drain #2 location: ___________________ Date __________ Time __________ Amount __________ Date __________ Time __________ Amount __________ Date __________ Time __________ Amount __________ Date __________ Time __________ Amount __________ Date __________ Time __________ Amount __________ Date __________ Time __________ Amount __________ Date __________ Time __________ Amount __________ Date __________ Time __________ Amount __________ Date __________ Time __________  Amount __________ Date __________ Time __________ Amount __________ Date __________ Time __________ Amount __________ Date __________ Time __________ Amount __________ Date __________ Time __________ Amount __________ Date __________ Time __________ Amount __________ Date __________ Time __________ Amount __________ Date __________ Time __________ Amount __________ Date __________ Time __________ Amount __________ Date __________ Time __________ Amount __________ Date __________ Time __________ Amount __________ Date __________ Time __________ Amount __________ Date __________ Time __________ Amount __________ This information is not intended to replace advice given to you by your health care provider. Make sure you discuss any questions you have with your health care provider. Document Released: 05/06/2017 Document Revised: 05/06/2017 Document Reviewed: 05/06/2017 Elsevier Patient Education  2020 [place] BYPASS/SLEEVE  Home Care Instructions   These instructions are to help you care for yourself when you go home.  Call: If you have any problems.  Call [phone] and ask for the surgeon on call  If you need immediate help, come to the ER at [facility].   Tell the ER staff that you are a new post-op gastric bypass or gastric sleeve patient   Signs and symptoms to report:  Severe vomiting or nausea o If you cannot keep down clear liquids for longer than 1 day, call your surgeon   Abdominal pain that does not get better after taking your pain medication  Fever over 100.4 F with chills  Heart beating over 100 beats a minute  Shortness of breath at rest  Chest pain   Redness, swelling, drainage, or foul odor at incision (surgical) sites   If your incisions open or pull apart  Swelling or pain in calf (lower leg)  Diarrhea (Loose bowel movements that happen often), frequent watery, uncontrolled bowel movements  Constipation, (no bowel movements  for 3 days) if this happens: Pick one o Milk of Magnesia, 2 tablespoons by mouth, 3 times a day for 2 days if needed o Stop taking Milk of Magnesia once you have a bowel movement o Call your doctor if constipation continues Or o Miralax  (instead of Milk of Magnesia) following the label instructions o Stop taking Miralax once you have a bowel movement o Call your doctor if constipation continues  Anything you think is not normal   Normal side effects after surgery:  Unable to sleep at night or unable to focus  Irritability or moody  Being tearful (crying) or depressed These are common complaints, possibly related to your anesthesia medications that put you to sleep, stress of surgery, and change in lifestyle.  This usually goes away a few weeks after surgery.  If these feelings continue, call your primary care doctor.   Wound Care: You may have surgical glue, steri-strips, or staples over your incisions after surgery  Surgical glue:  Looks like a clear film over your incisions and will wear off a little at a time  Steri-strips:  Strips of tape over your incisions. You may notice a yellowish color on the skin under the steri-strips. This is used to make the   steri-strips stick better. Do not pull the steri-strips off - let them fall off  Staples: Staples may be removed before you leave the hospital o If you go home with staples, call [facility], [phone]) [phone] at for an appointment with your surgeons nurse to have staples removed 10 days after surgery.  Showering: You may shower two (2) days after your surgery unless your surgeon tells you differently o Wash gently around incisions with warm soapy water, rinse well, and gently pat dry  o No tub baths until staples are removed, steri-strips fall off or glue is gone.    Medications:  Medications should be liquid or crushed if larger than the size of a dime  Extended release pills (medication that release a little bit at  a time through the day) should NOT be crushed or cut. (examples include XL, ER, DR, SR)  Depending on the size and number of medications you take, you may need to space (take a few throughout the day)/change the time you take your medications so that you do not over-fill your pouch (smaller stomach)  Make sure you follow-up with your primary care doctor to make medication changes needed during rapid weight loss and life-style changes  If you have diabetes, follow up with the doctor that orders your diabetes medication(s) within one week after surgery and check your blood sugar regularly.  Do not drive while taking prescription pain medication   It is ok to take Tylenol by the bottle instructions with your pain medicine or instead of your pain medicine as needed.  DO NOT TAKE NSAIDS (EXAMPLES OF NSAIDS:  IBUPROFREN/ NAPROXEN)  Diet:                    First 2 Weeks  You will see the dietician t about two (2) weeks after your surgery. The dietician will increase the types of foods you can eat if you are handling liquids well:  If you have severe vomiting or nausea and cannot keep down clear liquids lasting longer than 1 day, call your surgeon @ [phone]) Protein Shake  Drink at least 2 ounces of shake 5-6 times per day  Each serving of protein shakes (usually 8 - 12 ounces) should have: o 15 grams of protein  o And no more than 5 grams of carbohydrate   Goal for protein each day: o Men = 80 grams per day o Women = 60 grams per day  Protein powder may be added to fluids such as non-fat milk or Lactaid milk or unsweetened Soy/Almond milk (limit to 35 grams added protein powder per serving)  Hydration  Slowly increase the amount of water and other clear liquids as tolerated (See Acceptable Fluids)  Slowly increase the amount of protein shake as tolerated    Sip fluids slowly and throughout the day.  Do not use straws.  May use sugar substitutes in small amounts (no more than 6 -  8 packets per day; i.e. Splenda)  Fluid Goal  The first goal is to drink at least 8 ounces of protein shake/drink per day (or as directed by the nutritionist); some examples of protein shakes are [employer], [employer], EAS Edge HP, and Unjury. See handout from pre-op Bariatric Education Class: o Slowly increase the amount of protein shake you drink as tolerated o You  may find it easier to slowly sip shakes throughout the day o It is important to get your proteins in first  Your fluid goal is to drink 64 - 100 ounces of fluid daily o It may take a few weeks to build up to this  32 oz (or more) should be clear liquids  And   32 oz (or more) should be full liquids (see below for examples)  Liquids should not contain sugar, caffeine, or carbonation  Clear Liquids:  Water or Sugar-free flavored water (i.e. Fruit H2O, Propel)  Decaffeinated coffee or tea (sugar-free)  Crystal Lite, Wylers Lite, Minute Maid Lite  Sugar-free Jell-O  Bouillon or broth  Sugar-free Popsicle:   *Less than 20 calories each; Limit 1 per day  Full Liquids: Protein Shakes/Drinks + 2 choices per day of other full liquids  Full liquids must be: o No More Than 15 grams of Carbs per serving  o No More Than 3 grams of Fat per serving  Strained low-fat cream soup (except Cream of Potato or Tomato)  Non-Fat milk  Fat-free Lactaid Milk  Unsweetened Soy Or Unsweetened Almond Milk  Low Sugar yogurt (Dannon Lite & Fit, Greek yogurt; Oikos Triple Zero; Chobani Simply 100; Yoplait 100 calorie [place] - No Fruit on the Bottom)    Vitamins and Minerals  Start 1 day after surgery unless otherwise directed by your surgeon  2 Chewable Bariatric Specific Multivitamin / Multimineral Supplement with iron (Example: Bariatric Advantage Multi EA)  Chewable Calcium with Vitamin D-3 (Example: 3 Chewable Calcium Plus 600 with Vitamin D-3) o Take 500 mg three (3) times a day for a total of 1500 mg each  day o Do not take all 3 doses of calcium at one time as it may cause constipation, and you can only absorb 500 mg  at a time  o Do not mix multivitamins containing iron with calcium supplements; take 2 hours apart  Menstruating women and those with a history of anemia (a blood disease that causes weakness) may need extra iron o Talk with your doctor to see if you need more iron  Do not stop taking or change any vitamins or minerals until you talk to your dietitian or surgeon  Your Dietitian and/or surgeon must approve all vitamin and mineral supplements   Activity and Exercise: Limit your physical activity as instructed by your doctor.  It is important to continue walking at home.  During this time, use these guidelines:  Do not lift anything greater than ten (10) pounds for at least two (2) weeks  Do not go back to work or drive until Engineer, production says you can  You may have sex when you feel comfortable  o It is VERY important for female patients to use a reliable birth control method; fertility often increases after surgery  o All hormonal birth control will be ineffective for 30 days after surgery due to medications given during surgery a barrier method must be used. o Do not get pregnant for at least 18 months  Start exercising as soon as your doctor tells you that you can o Make sure your doctor approves any physical activity  Start with a simple walking program  Walk 5-15 minutes each day, 7 days per week.   Slowly increase until you are walking 30-45 minutes per day Consider joining our [place] program. [phone] or email [email or web address]   Special Instructions Things to remember:  Use your CPAP when sleeping if this applies to you  [facility] has two free Bariatric Surgery Support Groups that meet monthly o The 3rd Thursday of each month, 6 pm, [facility]  o The 2nd Friday of each month, 11:45 am in the private dining room in the  basement of [place]  It is very important to keep all follow up appointments with your surgeon, dietitian, primary care physician, and behavioral health practitioner  Routine follow up schedule with your surgeon include appointments at 2-3 weeks, 6-8 weeks, 6 months, and 1 year at a minimum.  Your surgeon may request to see you more often.   o After the first year, please follow up with your bariatric surgeon and dietitian at least once a year in order to maintain best weight loss results [facility]: [place]: [phone] Bariatric Nurse Coordinator: [phone]      Reviewed and Endorsed  by [facility] Patient Education Committee, June, 2016 Edits Approved: Aug, 2018

## 2019-05-28 NOTE — Progress Notes (Signed)
NT or writer will wheel patient out once family comes to pick her up

## 2019-05-28 NOTE — Discharge Summary (Signed)
Physician Discharge Summary  Sherry Dennis OIN:[id number] DOB: [birth year] DOA: 05/26/2019  PCP: Unk Pinto, MD  Admit date: 05/26/2019 Discharge date: 05/28/2019  Recommendations for Outpatient Follow-up:   Follow-up Information    Greer Pickerel, MD. Go on 06/17/2019.   Specialty: General Surgery Why: at 1115 Contact information: [address] [place] [address] [phone]        Carlena Hurl, PA-C. Go on 07/17/2019.   Specialty: General Surgery Why: at [employer] information: [address] [place] [address] [phone]        [facility], [place]. Go on 06/03/2019.   Specialty: General Surgery Why: Nurse appointment JP drain removal at 2 pm Contact information: [address] [place] [phone]       [facility], [place]. Go on 06/08/2019.   Specialty: General Surgery Why: Nurse visit staple removal at 2pm Contact information: [place] [address] [phone]          Discharge Diagnoses:  Principal Problem:   Morbid obesity ([place]) Active Problems:   Hypertension   Hyperlipidemia   Thyroid disease   Rheumatoid arthritis ([place])   COPD GOLD 0/ former smoker at risk   OSA on CPAP   S/P laparoscopic sleeve gastrectomy & open splenectomy   Surgical Procedure: Laparoscopic Sleeve Gastrectomy with open splenectomy, upper endoscopy  Discharge Condition: Good Disposition: Home  Diet recommendation: Postoperative sleeve gastrectomy diet (liquids only)  Filed Weights   05/26/19 1158  Weight: 127.7 kg     Hospital Course:  The patient was admitted for a planned laparoscopic sleeve gastrectomy. Preoperatively the patient was given 5000 units of subcutaneous heparin for DVT prophylaxis. Please see operative note.  Unfortunately there was a splenic capsular tear during the procedure.  I felt that we could not salvage the spleen because  of the risk of ongoing bleeding postoperatively and therefore splenectomy was performed.  Due to dense adhesions around the spleen decision was made to convert to open for that portion of the procedure.  She was placed stepdown unit overnight for observation.  She had some hypertensive issues but on postop day 1 she was doing well.  She was given her home blood pressure medications and transferred to a regular floor bed.  Postoperative prophylactic Lovenox dosing was started on the evening of postoperative day 1 in the evening-it was delayed due to the splenectomy and risk for postoperative bleeding.  Her hemoglobin have remained stable after surgery state was started on the evening of postoperative day 1.  ERAS protocol was used. On the evening of postoperative day 0, the patient was started on water and ice chips. On postoperative day 1 the patient had no fever or tachycardia and was tolerating water in their diet was gradually advanced throughout the day.  On postoperative day 2, the patient was ambulating without difficulty. Their vital signs are stable without fever or tachycardia. Their hemoglobin had remained stable. The patient was maintained on their home settings for CPAP therapy. The patient had received discharge instructions and counseling. They were deemed stable for discharge and had met discharge criteria.  She was discharged with her surgical drain in the splenic bed.  She received drain teaching.  The patient will come to the clinic mid next week for drain removal followed by staple removal the following week.  She will need postsplenectomy vaccines after discharge which are listed below (per pharmacy)   Per  pt, she has had hepatitis B vaccine as an adult.  She has had the flu shot in September of this year.  No drug interactions noted with any of pts medications   Week of 06/08/19:  pneumocococcal 13-valent conjugate vaccine (Prevnar 13) 0.42m IM x 1  Meningococcal oligosaccharide  (Menveo) 0.547mIM x 1  Week of 08/03/19:  pneumocococcal 23 valent  vaccine (PNU-IMMUNE) 0.65m16mM x 1  Meningococcal oligosaccharide (Menveo) 0.65ml365m x 1  Meningococcal B (Bexsero) 0.65ml 33mx 1  Week of 08/31/2019  Meningococcal B (Bexsero) 0.65ml I87m 1  5 years after surgery/splenectomy  pneumocococcal 23 valent  vaccine (Pnu-immune) 0.65ml IM59m1  Meningococcal oligosaccharide (Menveo) 0.65ml IM 27m  BP (!) 145/96 (BP Location: Right Arm)   Pulse 69   Temp 98.6 F (37 C) (Oral)   Resp 18   Ht 5' 5"  (1.651 m)   Wt 127.7 kg   SpO2 95%   BMI 46.86 kg/m   Gen: alert, NAD, non-toxic appearing Pupils: equal, no scleral icterus Pulm: Lungs clear to auscultation, symmetric chest rise CV: regular rate and rhythm Abd: soft, approp mild tender, nondistended.  No cellulitis. No incisional hernia. jp -serosang Ext: no edema, no calf tenderness Skin: no rash, no jaundice    Discharge Instructions  Discharge Instructions    Ambulate hourly while awake   Complete by: As directed    Call MD for:  difficulty breathing, headache or visual disturbances   Complete by: As directed    Call MD for:  persistant dizziness or light-headedness   Complete by: As directed    Call MD for:  persistant nausea and vomiting   Complete by: As directed    Call MD for:  redness, tenderness, or signs of infection (pain, swelling, redness, odor or green/yellow discharge around incision site)   Complete by: As directed    Call MD for:  severe uncontrolled pain   Complete by: As directed    Call MD for:  temperature >101 F   Complete by: As directed    Diet bariatric full liquid   Complete by: As directed    Discharge instructions   Complete by: As directed    See bariatric discharge instructions   Incentive spirometry   Complete by: As directed    Perform hourly while awake     Allergies as of 05/28/2019      Reactions   Darvon [propoxyphene Hcl] Anaphylaxis, Other (See Comments)    Airway swelling   Propoxyphene Anaphylaxis   Throat closes up   Doxycycline Nausea Only   Tamiflu [oseltamivir Phosphate] Hives, Other (See Comments)   Increased blood pressure      Medication List    STOP taking these medications   acetaminophen 650 MG CR tablet Commonly known as: TYLENOL Replaced by: acetaminophen 500 MG tablet   Xeljanz XR 22 MG Tb24 Generic drug: Tofacitinib Citrate ER     TAKE these medications   acetaminophen 500 MG tablet Commonly known as: TYLENOL Take 2 tablets (1,000 mg total) by mouth every 8 (eight) hours for 5 days. Replaces: acetaminophen 650 MG CR tablet   bisoprolol-hydrochlorothiazide 5-6.25 MG tablet Commonly known as: ZIAC Take 1 tablet by mouth daily. Notes to patient: Monitor Blood Pressure Daily and keep a log for primary care physician.  Monitor for symptoms of dehydration.  You may need to make changes to your medications with rapid weight loss.     cyanocobalamin 1000 MCG/ML injection Commonly known  as: (VITAMIN B-12) Inject 1 mL (1,000 mcg total) into the skin every 30 (thirty) days.   diphenhydrAMINE 25 MG tablet Commonly known as: BENADRYL Take 25 mg by mouth daily.   escitalopram 20 MG tablet Commonly known as: LEXAPRO TAKE 1 TABLET BY MOUTH EVERY DAY   furosemide 40 MG tablet Commonly known as: Lasix Take 1 tablet (40 mg total) by mouth daily. Notes to patient: Monitor Blood Pressure Daily and keep a log for primary care physician.  Monitor for symptoms of dehydration.  You may need to make changes to your medications with rapid weight loss.     gabapentin 100 MG capsule Commonly known as: NEURONTIN Take 2 capsules (200 mg total) by mouth every 12 (twelve) hours.   leflunomide 20 MG tablet Commonly known as: ARAVA Take 20 mg by mouth daily.   levothyroxine 137 MCG tablet Commonly known as: SYNTHROID Take one tablet by mouth every morning on empty stomach with water, 69mn prior to eating.  Wait 4 hours for  antacids.   morphine 15 MG tablet Commonly known as: MSIR Take 15 mg by mouth 3 (three) times daily as needed for severe pain.   olmesartan 20 MG tablet Commonly known as: BENICAR TAKE 2 TABLETS ('40MG'$ ) BY MOUTH DAILY What changed: See the new instructions.   ondansetron 4 MG disintegrating tablet Commonly known as: ZOFRAN-ODT Take 1 tablet (4 mg total) by mouth every 6 (six) hours as needed for nausea or vomiting.   pantoprazole 20 MG tablet Commonly known as: PROTONIX Take 1 tablet Daily for Indigestion & Heartburn   Vitamin D (Ergocalciferol) 1.25 MG (50000 UT) Caps capsule Commonly known as: DRISDOL 1 pill 3 days a week for vitamin d deficiency   Voltaren 1 % Gel Generic drug: diclofenac sodium Apply 2 g topically 4 (four) times daily as needed (joint pain).      Follow-up Information    WGreer Pickerel MD. Go on 06/17/2019.   Specialty: General Surgery Why: at 1115 Contact information: [address] [place] [phone]       GCarlena Hurl PA-C. Go on 07/17/2019.   Specialty: General Surgery Why: at [employer]: [address] [place] [phone]        [facility] [place] Go on 06/03/2019.   Specialty: General Surgery Why: Nurse appointment JP drain removal at 2 pm [address]: [phone]      [facility] [place] Go on 06/08/2019.   Specialty: General Surgery Why: Nurse visit staple removal at 2pm Contact [place]: [place] [phone]           The results of significant diagnostics from this hospitalization (including imaging, microbiology, ancillary and laboratory) are listed below for reference.    Significant Diagnostic Studies: No results found.  Labs: Basic Metabolic Panel: Recent Labs  Lab 05/27/19 0205  NA 136  K 4.1  CL  104  CO2 23  GLUCOSE 201*  BUN 18  CREATININE 0.90  CALCIUM 8.1*   Liver Function Tests: Recent Labs  Lab 05/27/19 0205  AST 93*  ALT 116*  ALKPHOS 66  BILITOT 0.8  PROT 5.9*  ALBUMIN 2.9*    CBC: Recent Labs  Lab 05/26/19 1832 05/27/19 0205 05/27/19 1229 05/28/19 0318  WBC  --  10.7*  --  15.3*  NEUTROABS  --  9.6*  --  12.4*  HGB 10.1* 9.2* 9.7* 9.4*  HCT 33.0* 30.6* 30.8* 30.4*  MCV  --  105.5*  --  104.8*  PLT  --  223  --  218    CBG: Recent Labs  Lab 05/27/19 2223 05/28/19 0024 05/28/19 0354 05/28/19 0723 05/28/19 1119  GLUCAP 120* 135* 112* 115* 124*    Principal Problem:   Morbid obesity ([place]) Active Problems:   Hypertension   Hyperlipidemia   Thyroid disease   Rheumatoid arthritis ([place])   COPD GOLD 0/ former smoker at risk   OSA on CPAP   S/P laparoscopic sleeve gastrectomy   Time coordinating discharge: 20 minutes  Signed:  Gayland Curry, MD [facility], [place] [phone] 05/28/2019, 7:26 PM

## 2019-05-28 NOTE — Progress Notes (Signed)
Patient alert and oriented, pain is controlled. Patient is tolerating fluids, advanced to protein shake today, patient is tolerating well.  Reviewed Gastric sleeve discharge instructions with patient and patient is able to articulate understanding.  Provided information on BELT program, Support Group and [facility] outpatient pharmacy. All questions answered, will continue to monitor.  Total fluid intake 690 Per dehydration protocol call back one week post op 

## 2019-05-28 NOTE — Progress Notes (Signed)
Patient alert and oriented, Post op day 2.  Provided support and encouragement.  Encouraged pulmonary toilet, ambulation and small sips of liquids.  All questions answered.  Will continue to monitor. 

## 2019-06-01 ENCOUNTER — Telehealth (HOSPITAL_COMMUNITY): Payer: Self-pay

## 2019-06-01 NOTE — Telephone Encounter (Signed)
Patient called to discuss post bariatric surgery follow up questions.  See below:   1.  Tell me about your pain and pain management?hasn't needed much pain medication just taking Tylenol  2.  Let's talk about fluid intake.  How much total fluid are you taking in?80 mostly water  3.  How much protein have you taken in the last 2 days?30 grams  4.  Have you had nausea?  Tell me about when have experienced nausea and what you did to help?denies nausea  5.  Has the frequency or color changed with your urine?light colored  6.  Tell me what your incisions look like?no problem  7.  Have you been passing gas? BM?had bm since discharge taking miralax  8.  If a problem or question were to arise who would you call?  Do you know contact numbers for [facility], [facility], and [facility]?aware of how to contact all services  9.  How has the walking going?up walking around  10.  How are your vitamins and calcium going?  How are you taking them?mvi and calcium no problems  JP drain doing well changed dressing no questions

## 2019-06-04 ENCOUNTER — Telehealth (HOSPITAL_COMMUNITY): Payer: Self-pay

## 2019-06-04 NOTE — Telephone Encounter (Signed)
Patient struggling with milky protein too sweet, called patient to discuss unflavored proteins which patient ordered through [place] to arrive today and [employer] milk.  We also discussed bone broth having a small amount of protein, but not a significant source.  Questions answered.

## 2019-06-04 NOTE — Anesthesia Postprocedure Evaluation (Signed)
Anesthesia Post Note  Patient: Sherry [place]  Procedure(s) Performed: LAPAROSCOPIC GASTRIC SLEEVE RESECTION, Upper Endo, ERAS Pathway (N/A ) SPLENECTOMY     Patient location during evaluation: PACU Anesthesia Type: General Level of consciousness: awake and alert Pain management: pain level controlled Vital Signs Assessment: post-procedure vital signs reviewed and stable Respiratory status: spontaneous breathing, nonlabored ventilation, respiratory function stable and patient connected to nasal cannula oxygen Cardiovascular status: blood pressure returned to baseline and stable Postop Assessment: no apparent nausea or vomiting Anesthetic complications: no    Last Vitals:  Vitals:   05/28/19 0605 05/28/19 0920  BP: 138/64 (!) 145/96  Pulse: 81 69  Resp: 18   Temp: 37.4 C 37 C  SpO2: 93% 95%    Last Pain:  Vitals:   05/28/19 1106  TempSrc:   PainSc: 5                  Tatanisha Cuthbert

## 2019-06-09 ENCOUNTER — Other Ambulatory Visit: Payer: Self-pay

## 2019-06-09 ENCOUNTER — Encounter: Payer: 59 | Attending: General Surgery | Admitting: Skilled Nursing Facility1

## 2019-06-09 DIAGNOSIS — E669 Obesity, unspecified: Secondary | ICD-10-CM | POA: Diagnosis present

## 2019-06-10 NOTE — Progress Notes (Addendum)
2 Week Post-Operative Nutrition Class   Patient was seen on 10/07/18 for Post-Operative Nutrition education at [facility].    Pt states she has had 20 grams of protein maximum every day since surgery. Pt states she is worried she will not be able to tolerate solid foods: dietitian will call pt later today to check in on status.   Surgery date: 05/26/2019 Surgery type: sleeve Start weight at [facility]: 263 Weight today: 260.2    Body Composition Scale 06/09/2019  Total Body Fat % 46.7  Visceral Fat 18  Fat-Free Mass % 53.2   Total Body Water % 41.1   Muscle-Mass lbs 31.3  Body Fat Displacement          Torso  lbs 75.3         Left Leg  lbs 15         Right Leg  lbs 15         Left Arm  lbs 7.5         Right Arm   lbs 7.5     The following the learning objectives were met by the patient during this course:  Identifies Phase 3 (Soft, High Proteins) Dietary Goals and will begin from 2 weeks post-operatively to 2 months post-operatively  Identifies appropriate sources of fluids and proteins   States protein recommendations and appropriate sources post-operatively  Identifies the need for appropriate texture modifications, mastication, and bite sizes when consuming solids  Identifies appropriate multivitamin and calcium sources post-operatively  Describes the need for physical activity post-operatively and will follow MD recommendations  States when to call healthcare provider regarding medication questions or post-operative complications   Handouts given during class include:  Phase 3A: Soft, High Protein Diet Handout   Follow-Up Plan: Patient will follow-up at [facility] in 6 weeks for 2 month post-op nutrition visit for diet advancement per MD.

## 2019-06-15 ENCOUNTER — Telehealth: Payer: Self-pay | Admitting: Skilled Nursing Facility1

## 2019-06-15 NOTE — Telephone Encounter (Signed)
RD called pt to verify fluid intake once starting soft, solid proteins 2 week post-bariatric surgery.   Daily Fluid intake: 64 Daily Protein intake: 60+  Concerns/issues:   Pt states her energy is great.

## 2019-06-17 ENCOUNTER — Other Ambulatory Visit: Payer: Self-pay | Admitting: Physician Assistant

## 2019-06-17 ENCOUNTER — Encounter: Payer: Self-pay | Admitting: Internal Medicine

## 2019-06-17 DIAGNOSIS — Z9081 Acquired absence of spleen: Secondary | ICD-10-CM | POA: Insufficient documentation

## 2019-06-17 MED ORDER — MENINGOCOCCAL VAC B (OMV) IM SUSY: 0.5000 mL | PREFILLED_SYRINGE | Freq: Once | INTRAMUSCULAR | 1 refills | Status: AC

## 2019-06-17 MED ORDER — MENINGOCOCCAL A C Y&W-135 OLIG IM SOLR: 0.5000 mL | Freq: Once | INTRAMUSCULAR | 1 refills | Status: AC

## 2019-06-21 ENCOUNTER — Other Ambulatory Visit: Payer: Self-pay | Admitting: Physician Assistant

## 2019-06-22 ENCOUNTER — Ambulatory Visit: Payer: 59 | Admitting: Physician Assistant

## 2019-06-22 NOTE — Progress Notes (Deleted)
   Subjective:    Patient ID: Sherry [place], female    DOB: [birth year], 58 y.o.   MRN: [id number]  HPI 58 y.o. obese WF presents after surgery for gastric bypass.  She had a gastric sleeve and splenectomy.   BMI is There is no height or weight on file to calculate BMI., she is working on diet and exercise. Wt Readings from Last 3 Encounters:  06/10/19 260 lb 3.2 oz (118 kg)  05/26/19 281 lb 9.6 oz (127.7 kg)  05/20/19 278 lb (126.1 kg)    There were no vitals taken for this visit.  Medications  Current Outpatient Medications (Endocrine & Metabolic):  .  levothyroxine (SYNTHROID) 137 MCG tablet, Take one tablet by mouth every morning on empty stomach with water, 22min prior to eating.  Wait 4 hours for antacids.  Current Outpatient Medications (Cardiovascular):  .  bisoprolol-hydrochlorothiazide (ZIAC) 5-6.25 MG tablet, Take 1 tablet Daily for BP .  furosemide (LASIX) 40 MG tablet, Take 1 tablet (40 mg total) by mouth daily. Marland Kitchen  olmesartan (BENICAR) 20 MG tablet, TAKE 2 TABLETS (40MG ) BY MOUTH DAILY (Patient taking differently: Take 40 mg by mouth daily. )  Current Outpatient Medications (Respiratory):  .  diphenhydrAMINE (BENADRYL) 25 MG tablet, Take 25 mg by mouth daily.   Current Outpatient Medications (Analgesics):  .  leflunomide (ARAVA) 20 MG tablet, Take 20 mg by mouth daily. Marland Kitchen  morphine (MSIR) 15 MG tablet, Take 15 mg by mouth 3 (three) times daily as needed for severe pain.   Current Outpatient Medications (Hematological):  .  cyanocobalamin (,VITAMIN B-12,) 1000 MCG/ML injection, Inject 1 mL (1,000 mcg total) into the skin every 30 (thirty) days.  Current Outpatient Medications (Other):  .  escitalopram (LEXAPRO) 20 MG tablet, TAKE 1 TABLET BY MOUTH EVERY DAY .  gabapentin (NEURONTIN) 100 MG capsule, Take 2 capsules (200 mg total) by mouth every 12 (twelve) hours. .  ondansetron (ZOFRAN-ODT) 4 MG disintegrating tablet, Take 1 tablet (4 mg total) by mouth every 6 (six)  hours as needed for nausea or vomiting. .  pantoprazole (PROTONIX) 20 MG tablet, Take 1 tablet Daily for Indigestion & Heartburn .  Vitamin D, Ergocalciferol, (DRISDOL) 1.25 MG (50000 UT) CAPS capsule, 1 pill 3 days a week for vitamin d deficiency .  VOLTAREN 1 % GEL, Apply 2 g topically 4 (four) times daily as needed (joint pain).  Problem list She has Hypertension; Hyperlipidemia; Thyroid disease; Vitamin D deficiency; Morbid (severe) obesity due to excess calories [facility]); PSVT (paroxysmal supraventricular tachycardia) ([place]); SVT (supraventricular tachycardia) ([place]); Rheumatoid arthritis ([place]); COPD GOLD 0/ former smoker at risk; External hemorrhoid, bleeding; Rectal pain; Aortic atherosclerosis ([place]); Adrenal adenoma; History of colonic polyps; Polyp of cecum; OSA on CPAP; Nocturnal hypoxemia due to emphysema ([place]); Nocturia more than twice per night; Heart palpitations; Snoring; Former smoker; Morbid obesity ([place]); S/P laparoscopic sleeve gastrectomy; and H/O splenectomy on their problem list.   Review of Systems     Objective:   Physical Exam        Assessment & Plan:

## 2019-07-04 ENCOUNTER — Other Ambulatory Visit: Payer: Self-pay | Admitting: Adult Health

## 2019-07-04 DIAGNOSIS — E039 Hypothyroidism, unspecified: Secondary | ICD-10-CM

## 2019-07-21 ENCOUNTER — Ambulatory Visit: Payer: 59 | Admitting: Skilled Nursing Facility1

## 2019-07-30 ENCOUNTER — Ambulatory Visit: Payer: 59 | Admitting: Dietician

## 2019-07-31 ENCOUNTER — Encounter: Payer: Self-pay | Admitting: Dietician

## 2019-07-31 ENCOUNTER — Other Ambulatory Visit: Payer: Self-pay

## 2019-07-31 ENCOUNTER — Encounter: Payer: 59 | Attending: General Surgery | Admitting: Dietician

## 2019-07-31 DIAGNOSIS — E669 Obesity, unspecified: Secondary | ICD-10-CM | POA: Insufficient documentation

## 2019-07-31 NOTE — Progress Notes (Signed)
Bariatric Nutrition Follow-Up Visit Medical Nutrition Therapy  Appt Start Time: 7:50am   End Time: 8:30am  2 Months Post-Operative Sleeve Surgery Surgery Date: 05/26/2019  Pt's Expectations of Surgery/ Goals: better health, less joint pain, ability to be there for family in the future, bend over easier, less about the "looks"   NUTRITION ASSESSMENT  Anthropometrics  Start weight at [facility]: 263 lbs (date: 11/26/2018) Today's weight: 254 lbs  Body Composition Scale 06/09/2019 07/31/2019  Weight  lbs 260.2 254  BMI 43.3 42.3  Total Body Fat  % 46.7 -     Visceral Fat 18 -  Fat-Free Mass  % 53.2 -     Total Body Water  % 41.1 -     Muscle-Mass  lbs 31.3 -  Body Fat Displacement --- ---         Torso  lbs 75.3 -         Left Leg  lbs 15 -         Right Leg  lbs 15 -         Left Arm  lbs 7.5 -         Right Arm  lbs 7.5 -    Lifestyle & Dietary Hx States she also had her spleen removed at time of bariatric surgery, so her recovery time was longer. Not released for physical activity yet. Sometimes feels pain/cramping when eating, thinks it may be because she is eating too quickly or not chewing enough. States that, since going back to work, it has been difficult getting into a good routine with eating frequently and slowing down, taking adequate time to eat and chew slowly. Sometimes skips lunch. If have a snack, with have a beef stick with cheese. Tried other foods such as spaghetti and broccoli salad.   24-Hr Dietary Recall First Meal: nabs  Snack: -  Second Meal: chicken breast  Snack: -  Third Meal: hamburger patty  Snack: - Beverages: water  Estimated daily fluid intake: 64+ oz Estimated daily protein intake: ~40 g Supplements: bariatric MVI, calcium 3/day  Current average weekly physical activity: walking at work   [employer] Symptoms Using straws: no Drinking while eating: yes  Chewing/swallowing difficulties: no Changes in vision: no Changes to  mood/headaches: no Hair loss/changes to skin/nails: no Difficulty focusing/concentrating: no Sweating: no Dizziness/lightheadedness: no Palpitations: no  Carbonated/caffeinated beverages: yes (coffee)  N/V/D/C/Gas: constipation  Abdominal pain: yes Dumping syndrome: no   NUTRITION DIAGNOSIS  Overweight/obesity ([place]-3.3) related to past poor dietary habits and physical inactivity as evidenced by completed bariatric surgery and following dietary guidelines for continued weight loss and healthy nutrition status.   NUTRITION INTERVENTION Nutrition counseling (C-1) and education (E-2) to facilitate bariatric surgery goals, including: . Diet advancement to the next phase (phase 4) now including non-starchy vegetables . The importance of consuming adequate calories as well as certain nutrients daily due to the body's need for essential vitamins, minerals, and fats . The importance of daily physical activity and to reach a goal of at least 150 minutes of moderate to vigorous physical activity weekly (or as directed by their physician) due to benefits such as increased musculature and improved lab values  Handouts Provided Include   Phase 4: Protein + Non-Starchy Vegetables   Phase 4 Sample 3 Day Meal Plan (60g protein)   Bariatric Snack Ideas   Learning Style & Readiness for Change Teaching method utilized: Visual & Auditory  Demonstrated degree of understanding via: Teach Back  Barriers  to learning/adherence to lifestyle change: None Identified    MONITORING & EVALUATION Dietary intake, weekly physical activity, body weight, and goals in 4 months.  Next Steps Patient is to follow-up in 4 months for 6 month post-op follow-up.

## 2019-07-31 NOTE — Patient Instructions (Signed)

## 2019-08-10 NOTE — Progress Notes (Signed)
Assessment and Plan:   Essential hypertension - continue medications, DASH diet, exercise and monitor at home. Call if greater than 130/80.  -     CBC with Differential/Platelet -     COMPLETE METABOLIC PANEL WITH GFR -     TSH  PSVT (paroxysmal supraventricular tachycardia) (HCC) Control blood pressure, cholesterol, glucose, increase exercise.   SVT (supraventricular tachycardia) (HCC) Continue cardio follow up  Aortic atherosclerosis (HCC) Control blood pressure, cholesterol, glucose, increase exercise.   Chronic obstructive pulmonary disease, unspecified COPD type ([place]) Patient started back up with smoking, went over strategies to stop   OSA and COPD overlap syndrome (HCC) Continue cPAP  Nocturnal hypoxemia due to emphysema (HCC) Continue CPAP  Rheumatoid arthritis, involving unspecified site, unspecified rheumatoid factor presence ([place]) Continue follow up  Thyroid disease Hypothyroidism-check TSH level, continue medications the same, reminded to take on an empty stomach 30-55mins before food.   Mixed hyperlipidemia check lipids decrease fatty foods increase activity.  -     Lipid panel check lipids decrease fatty foods increase activity.   Vitamin D deficiency -     VITAMIN D 25 Hydroxy (Vit-D Deficiency, Fractures)  Abnormal glucose -     Hemoglobin A1c Discussed disease progression and risks Discussed diet/exercise, weight management and risk modification  Medication management -     Magnesium  S/P laparoscopic sleeve gastrectomy Continue follow up, will need to monitor for deficiencies  Urinary frequency -     Urinalysis, Routine w reflex microscopic -     Culture, Urine   Continue diet and meds as discussed. Further disposition pending results of labs. Future Appointments  Date Time Provider [place]  09/01/2019  3:00 PM Vicie Mutters, PA-C [name] None  11/23/2019  4:00 PM Scotece, Francesco Sor, RD [place] [facility]    HPI 58 y.o. female   presents for 3 month follow up with hypertension, hyperlipidemia, prediabetes and vitamin D and CPE  Her blood pressure has been controlled at home, her BP is doing better with ziac and lasix pill added in June for swelling/weight gain, today their BP is BP: 110/72   Patient has history of ablation for SVT with Dr. Lajuana Ripple in 2016.  She has a history of RA.   Following with Dr. Amil Amen.   She scheduled to get the gastric sleeve Oct 13th, highest weight was 273. She has gone back to smoking, a few everyday. States her knees are better.  BMI is Body mass index is 40.27 kg/m., she is working on diet and exercise. Wt Readings from Last 3 Encounters:  08/13/19 242 lb (109.8 kg)  07/31/19 254 lb (115.2 kg)  06/10/19 260 lb 3.2 oz (118 kg)   She is on 20mg  of lexapro and she is doing better. She does not workout.  She denies chest pain, shortness of breath, dizziness.  She is not on cholesterol medication and denies myalgias. Her cholesterol is at goal. The cholesterol last visit was:   Lab Results  Component Value Date   CHOL 209 (H) 05/13/2019   HDL 39 (L) 05/13/2019   LDLCALC 122 (H) 05/13/2019   TRIG 336 (H) 05/13/2019   CHOLHDL 5.4 (H) 05/13/2019   She has been working on diet and exercise for prediabetes, and denies paresthesia of the feet, polydipsia and polyuria. Last A1C in the office was:  Lab Results  Component Value Date   HGBA1C 5.9 (H) 05/13/2019   Patient is on Vitamin D supplement, she is on 50,000, 3 days a week.  Lab Results  Component Value Date   VD25OH 19 (L) 05/13/2019     She is on thyroid medication. Her medication is on 171mcg daily with water 1 hour before food.    Lab Results  Component Value Date   TSH 4.65 (H) 05/13/2019  .  She has a B12 def, on B12 injections, has not taken it this month.   Lab Results  Component Value Date   VITAMINB12 208 08/12/2018    Current Medications:  Current Outpatient Medications on File Prior to Visit   Medication Sig Dispense Refill  . bisoprolol-hydrochlorothiazide (ZIAC) 5-6.25 MG tablet Take 1 tablet Daily for BP 30 tablet 0  . cyanocobalamin (,VITAMIN B-12,) 1000 MCG/ML injection Inject 1 mL (1,000 mcg total) into the skin every 30 (thirty) days. 30 mL 2  . diphenhydrAMINE (BENADRYL) 25 MG tablet Take 25 mg by mouth daily.     Marland Kitchen escitalopram (LEXAPRO) 20 MG tablet TAKE 1 TABLET BY MOUTH EVERY DAY 30 tablet 5  . furosemide (LASIX) 40 MG tablet Take 1 tablet (40 mg total) by mouth daily. 90 tablet 3  . gabapentin (NEURONTIN) 100 MG capsule Take 2 capsules (200 mg total) by mouth every 12 (twelve) hours. 20 capsule 0  . levothyroxine (SYNTHROID) 137 MCG tablet Take 1 tablet daily on an empty stomach with only water for 30 minutes & no Antacid meds, Calcium or Magnesium for 4 hours & avoid Biotin 90 tablet 1  . olmesartan (BENICAR) 20 MG tablet TAKE 2 TABLETS (40MG ) BY MOUTH DAILY (Patient taking differently: Take 40 mg by mouth daily. ) 60 tablet 5  . ondansetron (ZOFRAN-ODT) 4 MG disintegrating tablet Take 1 tablet (4 mg total) by mouth every 6 (six) hours as needed for nausea or vomiting. 20 tablet 0  . pantoprazole (PROTONIX) 20 MG tablet Take 1 tablet Daily for Indigestion & Heartburn 90 tablet 3  . Vitamin D, Ergocalciferol, (DRISDOL) 1.25 MG (50000 UT) CAPS capsule 1 pill 3 days a week for vitamin d deficiency 36 capsule 1  . VOLTAREN 1 % GEL Apply 2 g topically 4 (four) times daily as needed (joint pain). 100 g 2  . leflunomide (ARAVA) 20 MG tablet Take 20 mg by mouth daily.    Marland Kitchen morphine (MSIR) 15 MG tablet Take 15 mg by mouth 3 (three) times daily as needed for severe pain.      No current facility-administered medications on file prior to visit.   Medical History:  Past Medical History:  Diagnosis Date  . Allergy   . Anemia   . Arthritis    "knees, hands, ankles, ~ every joint" (04/08/2015)  . Basal cell carcinoma    "face, hands, chest" (04/08/2015)  . Chronic bronchitis  ([place])    "get it pretty much q yr" (04/08/2015)  . Dysrhythmia    palpitations occ; SVT 09/2013 s/p adenosine  . GERD (gastroesophageal reflux disease)   . H/O hiatal hernia   . Hx of cardiovascular stress test    Lexiscan Myoview 6/16: Ejection fraction is 60% and wall motion is normal. The study is normal. There is no scar or ischemia. This is a low risk scan.  . Hyperlipidemia   . Hypertension   . Hypothyroidism   . Palpitations   . Pneumonia 2015 X 1  . PONV (postoperative nausea and vomiting)   . Pre-diabetes   . S/P total knee arthroplasty 02/15/2014  . Sleep apnea   . Thyroid disease   . Vitamin D deficiency  Allergies Allergies  Allergen Reactions  . Darvon [Propoxyphene Hcl] Anaphylaxis and Other (See Comments)    Airway swelling   . Propoxyphene Anaphylaxis    Throat closes up  . Doxycycline Nausea Only  . Tamiflu [Oseltamivir Phosphate] Hives and Other (See Comments)    Increased blood pressure    SURGICAL HISTORY She  has a past surgical history that includes Carpal tunnel release (Right, 1990); Tubal ligation (1987); Knee arthroscopy (Right, 2013); Bladder repair (1993); Endometrial ablation (2009); Excision basal cell carcinoma (2014); Partial knee arthroplasty (Right, 02/15/2014); Cardiac catheterization (N/A, 04/08/2015); Joint replacement; Incontinence surgery (2010); Knee arthroscopy; Cardiac surgery; Colonoscopy; Polypectomy; Colonoscopy with propofol (N/A, 11/25/2017); Cardiac catheterization (2017); Laparoscopic gastric sleeve resection (N/A, 05/26/2019); and Splenectomy, total (05/26/2019). FAMILY HISTORY Her family history includes Atrial fibrillation in her mother; Breast cancer in her paternal aunt; COPD in her father; Diabetes in her father; Emphysema in her father; Heart attack in her maternal grandmother; Heart disease in her maternal grandfather, paternal grandfather, and paternal grandmother; Hyperlipidemia in her maternal grandfather, paternal  grandfather, and paternal grandmother; Hypertension in her mother; Stroke in her maternal grandmother. SOCIAL HISTORY She  reports that she quit smoking about 6 months ago. Her smoking use included cigarettes. She has a 60.00 pack-year smoking history. She has never used smokeless tobacco. She reports that she does not drink alcohol or use drugs.   Review of Systems  Constitutional: Positive for malaise/fatigue. Negative for chills, diaphoresis, fever and weight loss.  HENT: Negative.   Eyes: Negative.   Respiratory: Positive for cough, shortness of breath (with exeriton) and wheezing.   Cardiovascular: Positive for leg swelling. Negative for chest pain, palpitations, orthopnea, claudication and PND.  Gastrointestinal: Negative.   Genitourinary: Negative.   Musculoskeletal: Positive for back pain. Negative for falls, joint pain, myalgias and neck pain.  Skin: Negative.   Neurological: Negative for dizziness, tingling, tremors, sensory change, speech change, focal weakness, seizures, loss of consciousness, weakness and headaches.  Psychiatric/Behavioral: Negative for depression, hallucinations, memory loss, substance abuse and suicidal ideas. The patient is not nervous/anxious and does not have insomnia.     Physical Exam: BP 110/72   Pulse 72   Temp (!) 97 F (36.1 C)   Ht 5\' 5"  (1.651 m)   Wt 242 lb (109.8 kg)   SpO2 96%   BMI 40.27 kg/m  Wt Readings from Last 3 Encounters:  08/13/19 242 lb (109.8 kg)  07/31/19 254 lb (115.2 kg)  06/10/19 260 lb 3.2 oz (118 kg)   General Appearance: Well nourished, in no apparent distress. Eyes: PERRLA, EOMs, conjunctiva no swelling or erythema Sinuses: No Frontal/maxillary tenderness ENT/Mouth: Ext aud canals clear, TMs without erythema, bulging. No erythema, swelling, or exudate on post pharynx.  Tonsils not swollen or erythematous. Hearing normal.  Neck: Supple, thyroid normal.  Respiratory: Respiratory effort normal, BS equal bilaterally  with wheezing left lower lung rhonchi, or stridor.  Cardio: RRR with no MRGs. Brisk peripheral pulses with 2+ edema.  Abdomen: Soft, + BS, obese Non tender, no guarding, rebound, hernias, masses. Lymphatics: Non tender without lymphadenopathy.  Musculoskeletal: Full ROM, 5/5 strength, antalgic gait Skin: Warm, dry without rashes, lesions, ecchymosis.  Neuro: Cranial nerves intact. Normal muscle tone, no cerebellar symptoms. Sensation intact.  Psych: Awake and oriented X 3, normal affect, Insight and Judgment appropriate.    Vicie Mutters, PA-C 4:09 PM [facility]

## 2019-08-13 ENCOUNTER — Other Ambulatory Visit: Payer: Self-pay

## 2019-08-13 ENCOUNTER — Encounter: Payer: Self-pay | Admitting: Physician Assistant

## 2019-08-13 ENCOUNTER — Ambulatory Visit: Payer: 59 | Admitting: Physician Assistant

## 2019-08-13 VITALS — BP 110/72 | HR 72 | Temp 97.0°F | Ht 65.0 in | Wt 242.0 lb

## 2019-08-13 DIAGNOSIS — Z9989 Dependence on other enabling machines and devices: Secondary | ICD-10-CM

## 2019-08-13 DIAGNOSIS — J449 Chronic obstructive pulmonary disease, unspecified: Secondary | ICD-10-CM | POA: Diagnosis not present

## 2019-08-13 DIAGNOSIS — Z9884 Bariatric surgery status: Secondary | ICD-10-CM

## 2019-08-13 DIAGNOSIS — M069 Rheumatoid arthritis, unspecified: Secondary | ICD-10-CM

## 2019-08-13 DIAGNOSIS — I471 Supraventricular tachycardia: Secondary | ICD-10-CM

## 2019-08-13 DIAGNOSIS — I10 Essential (primary) hypertension: Secondary | ICD-10-CM

## 2019-08-13 DIAGNOSIS — J439 Emphysema, unspecified: Secondary | ICD-10-CM

## 2019-08-13 DIAGNOSIS — E079 Disorder of thyroid, unspecified: Secondary | ICD-10-CM

## 2019-08-13 DIAGNOSIS — G4736 Sleep related hypoventilation in conditions classified elsewhere: Secondary | ICD-10-CM

## 2019-08-13 DIAGNOSIS — R350 Frequency of micturition: Secondary | ICD-10-CM

## 2019-08-13 DIAGNOSIS — E782 Mixed hyperlipidemia: Secondary | ICD-10-CM

## 2019-08-13 DIAGNOSIS — I700 Atherosclerosis of aorta: Secondary | ICD-10-CM | POA: Diagnosis not present

## 2019-08-13 DIAGNOSIS — E559 Vitamin D deficiency, unspecified: Secondary | ICD-10-CM

## 2019-08-13 DIAGNOSIS — Z79899 Other long term (current) drug therapy: Secondary | ICD-10-CM

## 2019-08-13 DIAGNOSIS — G4733 Obstructive sleep apnea (adult) (pediatric): Secondary | ICD-10-CM

## 2019-08-13 NOTE — Patient Instructions (Addendum)
Use boric acid vaginal suppositories from [place] lower leg venous system is not the most reliable, the heart does NOT pump fluid up, there is a valve system.  The muscles of the leg squeeze and the blood moves up and a valve opens and close, then they squeeze, blood moves up and valves open and closes keeping the blood moving towards the heart.  Lots can go wrong with this valve system.  If someone is sitting or standing without movement, everyone will get swelling.  THINGS TO DO:  Do not stand or sit in one position for long periods of time. Do not sit with your legs crossed. Rest with your legs raised during the day.  Your legs have to be higher than your heart so that gravity will force the valves to open, so please really elevate your legs.   Wear elastic stockings or support hose. Do not wear other tight, encircling garments around the legs, pelvis, or waist.  [employer]  has a wide variety of well priced compression stockings. [place], [place] [address] [address] [place] has a good cheap selection, I like the socks, they are not as hard to get on  Walk as much as possible to increase blood flow.  Raise the foot of your bed at night with 2-inch blocks.  SEEK MEDICAL CARE IF:   The skin around your ankle starts to break down.  You have pain, redness, tenderness, or hard swelling developing in your leg over a vein.  You are uncomfortable due to leg pain.  If you ever have shortness of breath with exertion or chest pain go to the ER.   General eating tips  What to Avoid . Avoid added sugars o Often added sugar can be found in processed foods such as many condiments, dry cereals, cakes, cookies, chips, crisps, crackers, candies, sweetened drinks, etc.  o Read labels and AVOID/DECREASE use of foods with the following in their ingredient list: Sugar, fructose, high fructose corn syrup, sucrose, glucose, maltose, dextrose,  molasses, cane sugar, brown sugar, any type of syrup, agave nectar, etc.   . Avoid snacking in between meals- drink water or if you feel you need a snack, pick a high water content snack such as cucumbers, watermelon, or any veggie.  Marland Kitchen Avoid foods made with flour o If you are going to eat food made with flour, choose those made with whole-grains; and, minimize your consumption as much as is tolerable . Avoid processed foods o These foods are generally stocked in the middle of the grocery store.  o Focus on shopping on the perimeter of the grocery.  What to Include . Vegetables o GREEN LEAFY VEGETABLES: Kale, spinach, mustard greens, collard greens, cabbage, broccoli, etc. o OTHER: Asparagus, cauliflower, eggplant, carrots, peas, Brussel sprouts, tomatoes, bell peppers, zucchini, beets, cucumbers, etc. . Grains, seeds, and legumes o Beans: kidney beans, black eyed peas, garbanzo beans, black beans, pinto beans, etc. o Whole, unrefined grains: brown rice, barley, bulgur, oatmeal, etc. . Healthy fats  o Avoid highly processed fats such as vegetable oil o Examples of healthy fats: avocado, olives, virgin olive oil, dark chocolate (?72% Cocoa), nuts (peanuts, almonds, walnuts, cashews, pecans, etc.) o Please still do small amount of these healthy fats, they are dense in calories.  . Low - Moderate Intake of Animal Sources of Protein o Meat sources: chicken, [place], salmon, tuna. Limit to 4 ounces of meat at one time or  the size of your palm. o Consider limiting dairy sources, but when choosing dairy focus on: PLAIN [place] yogurt, cottage cheese, high-protein milk . Fruit o Choose berries

## 2019-08-15 LAB — COMPLETE METABOLIC PANEL WITH GFR
AG Ratio: 1.5 (calc) (ref 1.0–2.5)
ALT: 11 U/L (ref 6–29)
AST: 13 U/L (ref 10–35)
Albumin: 4.3 g/dL (ref 3.6–5.1)
Alkaline phosphatase (APISO): 99 U/L (ref 37–153)
BUN/Creatinine Ratio: 17 (calc) (ref 6–22)
BUN: 19 mg/dL (ref 7–25)
CO2: 27 mmol/L (ref 20–32)
Calcium: 9.7 mg/dL (ref 8.6–10.4)
Chloride: 103 mmol/L (ref 98–110)
Creat: 1.13 mg/dL — ABNORMAL HIGH (ref 0.50–1.05)
GFR, Est African American: 63 mL/min/{1.73_m2} (ref >=60)
GFR, Est Non African American: 54 mL/min/{1.73_m2} — ABNORMAL LOW (ref >=60)
Globulin: 2.9 g/dL (calc) (ref 1.9–3.7)
Glucose, Bld: 103 mg/dL — ABNORMAL HIGH (ref 65–99)
Potassium: 3.9 mmol/L (ref 3.5–5.3)
Sodium: 140 mmol/L (ref 135–146)
Total Bilirubin: 0.2 mg/dL (ref 0.2–1.2)
Total Protein: 7.2 g/dL (ref 6.1–8.1)

## 2019-08-15 LAB — VITAMIN D 25 HYDROXY (VIT D DEFICIENCY, FRACTURES): Vit D, 25-Hydroxy: 33 ng/mL (ref 30–100)

## 2019-08-15 LAB — URINE CULTURE
MICRO NUMBER:: 10000441
SPECIMEN QUALITY:: ADEQUATE

## 2019-08-15 LAB — CBC WITH DIFFERENTIAL/PLATELET
Absolute Monocytes: 860 cells/uL (ref 200–950)
Basophils Absolute: 26 cells/uL (ref 0–200)
Basophils Relative: 0.3 %
Eosinophils Absolute: 77 cells/uL (ref 15–500)
Eosinophils Relative: 0.9 %
HCT: 37 % (ref 35.0–45.0)
Hemoglobin: 11.8 g/dL (ref 11.7–15.5)
Lymphs Abs: 2055 cells/uL (ref 850–3900)
MCH: 30.4 pg (ref 27.0–33.0)
MCHC: 31.9 g/dL — ABNORMAL LOW (ref 32.0–36.0)
MCV: 95.4 fL (ref 80.0–100.0)
MPV: 11 fL (ref 7.5–12.5)
Monocytes Relative: 10 %
Neutro Abs: 5581 cells/uL (ref 1500–7800)
Neutrophils Relative %: 64.9 %
Platelets: 407 10*3/uL — ABNORMAL HIGH (ref 140–400)
RBC: 3.88 10*6/uL (ref 3.80–5.10)
RDW: 13.6 % (ref 11.0–15.0)
Total Lymphocyte: 23.9 %
WBC: 8.6 10*3/uL (ref 3.8–10.8)

## 2019-08-15 LAB — LIPID PANEL
Cholesterol: 187 mg/dL (ref <200)
HDL: 38 mg/dL — ABNORMAL LOW (ref >=50)
LDL Cholesterol (Calc): 117 mg/dL (calc) — ABNORMAL HIGH
Non-HDL Cholesterol (Calc): 149 mg/dL (calc) — ABNORMAL HIGH (ref <130)
Total CHOL/HDL Ratio: 4.9 (calc) (ref <5.0)
Triglycerides: 199 mg/dL — ABNORMAL HIGH (ref <150)

## 2019-08-15 LAB — HEMOGLOBIN A1C
Hgb A1c MFr Bld: 5.8 % of total Hgb — ABNORMAL HIGH (ref <5.7)
Mean Plasma Glucose: 120 (calc)
eAG (mmol/L): 6.6 (calc)

## 2019-08-15 LAB — TSH: TSH: 28.08 mIU/L — ABNORMAL HIGH (ref 0.40–4.50)

## 2019-08-15 LAB — MAGNESIUM: Magnesium: 2.2 mg/dL (ref 1.5–2.5)

## 2019-08-31 NOTE — Progress Notes (Signed)
COMPLETE PHYSICAL  Assessment and Plan:  HAD RECENT LABS 2 WEEKS AGO WITH A1C/CHOL  Encounter for general adult medical examination with abnormal findings 1 year  Aortic atherosclerosis ([place]) Control blood pressure, cholesterol, glucose, increase exercise.   PSVT (paroxysmal supraventricular tachycardia) (HCC) Continue cardio follow up  SVT (supraventricular tachycardia) (HCC) Continue cardio follow up  COPD GOLD 0/ former smoker at risk No triggers, well controlled symptoms, cont to monitor  Nocturnal hypoxemia due to emphysema (HCC) Monitor  Rheumatoid arthritis, involving unspecified site, unspecified whether rheumatoid factor present ([place]) Continue follow up   Morbid obesity ([place]) - follow up 3 months for progress monitoring - increase veggies, decrease carbs - long discussion about weight loss, diet, and exercise - reach water goal, add in exercise.   Mixed hyperlipidemia check lipids decrease fatty foods increase activity.   Thyroid disease Hypothyroidism-check TSH level, continue medications the same, reminded to take on an empty stomach 30-33mns before food.  -     TSH  Vitamin D deficiency Continue supplement  Adenoma of left adrenal gland Monitor  OSA on CPAP Continue CPAP nightly  Essential hypertension -     Urinalysis, Routine w reflex microscopic -     Microalbumin / creatinine urine ratio - continue medications, DASH diet, exercise and monitor at home. Call if greater than 130/80.   Polyp of cecum Monitor  Former smoker Monitor  H/O splenectomy -     Pneumococcal conjugate vaccine 13-valent IM -     meningococcal oligosaccharide (MENVEO) injection; Inject 0.5 mLs into the muscle once for 1 dose. -     meningococcal B (BEXSERO) SUSY injection; Inject 0.5 mLs into the muscle once for 1 dose. - will need penumovax 23 2-3 months after prevnar and then she will be complete of splenectomy vaccines until 5 year mark.   S/P laparoscopic  sleeve gastrectomy Continue follow up  Abnormal urine -     Urine Culture  Screening, anemia, deficiency, iron -     Iron,Total/Total Iron Binding Cap -     Vitamin B12 -     Ferritin  B12 deficiency -     Vitamin B12  Other orders -     triamcinolone cream (KENALOG) 0.5 %; Apply 1 application topically 2 (two) times daily. -     MICROSCOPIC MESSAGE    Continue diet and meds as discussed. Further disposition pending results of labs. Future Appointments  Date Time Provider DCalmar 11/23/2019  4:00 PM [place]uby [place]a [place]  08/31/2020  3:00 PM CVicie Mutters PA-C [name] None    HPI 59y.o. female  presents for 3 month follow up with hypertension, hyperlipidemia, prediabetes and vitamin D and CPE  Has had 2 exposures at work, last exposure was Thursday, with out mask on.   Her blood pressure has been controlled at home, her BP is doing better with ziac 522m Benicar 2075mnd lasix pill BUT she forgot her medications today, today their BP is BP: (!) 140/100   Patient has history of ablation for SVT with Dr. NisLajuana Ripple 2016.  She has a history of RA.  Following with Dr. BeeAmil Amen She is following with Derm on 20 mg of doxy.   She had the gastric sleeve Oct 13th, highest weight was 273. She has gone back to smoking, a few everyday. States her knees are better.  Due to complications she had a splenectomy BMI is Body mass index is 40.37 kg/m., she is working on  diet and exercise. She has not met her fluid intake, she has met her protein intake.  Wt Readings from Last 3 Encounters:  09/01/19 242 lb 9.6 oz (110 kg)  08/13/19 242 lb (109.8 kg)  07/31/19 254 lb (115.2 kg)   She is on 85m of lexapro and she is doing better. She does not workout.  She denies chest pain, shortness of breath, dizziness.  She is not on cholesterol medication, family history of MI, GM at 763 mom at 732and denies myalgias. Had recent Stress test 03/2019, normal Echo,  did show LVH.  Her cholesterol is not at goal. The cholesterol last visit was:   Lab Results  Component Value Date   CHOL 187 08/13/2019   HDL 38 (L) 08/13/2019   LDLCALC 117 (H) 08/13/2019   TRIG 199 (H) 08/13/2019   CHOLHDL 4.9 08/13/2019   She has been working on diet and exercise for prediabetes, and denies paresthesia of the feet, polydipsia and polyuria. Last A1C in the office was:  Lab Results  Component Value Date   HGBA1C 5.8 (H) 08/13/2019   Patient is on Vitamin D supplement, she is on 50,000, 3 days a week- not taking well.    Lab Results  Component Value Date   VD25OH 33 08/13/2019     She is on thyroid medication. Her medication is on 1356m daily with water 1 hour before food.- she admits she was off schedule but she is back on it since last visit.  Lab Results  Component Value Date   TSH 28.08 (H) 08/13/2019  .  She has a B12 def, on B12 injections, she has taken in Dec and Jan.   Lab Results  Component Value Date   VITAMINB12 208 08/12/2018    Current Medications:   Current Outpatient Medications (Endocrine & Metabolic):  .  levothyroxine (SYNTHROID) 137 MCG tablet, Take 1 tablet daily on an empty stomach with only water for 30 minutes & no Antacid meds, Calcium or Magnesium for 4 hours & avoid Biotin  Current Outpatient Medications (Cardiovascular):  .  bisoprolol-hydrochlorothiazide (ZIAC) 5-6.25 MG tablet, Take 1 tablet Daily for BP .  furosemide (LASIX) 40 MG tablet, Take 1 tablet (40 mg total) by mouth daily. . Marland Kitchenolmesartan (BENICAR) 20 MG tablet, TAKE 2 TABLETS (40MG) BY MOUTH DAILY (Patient taking differently: Take 40 mg by mouth daily. )  Current Outpatient Medications (Respiratory):  .  diphenhydrAMINE (BENADRYL) 25 MG tablet, Take 25 mg by mouth daily.   Current Outpatient Medications (Analgesics):  .  leflunomide (ARAVA) 20 MG tablet, Take 20 mg by mouth daily. . Marland Kitchenmorphine (MSIR) 15 MG tablet, Take 15 mg by mouth 3 (three) times daily as needed  for severe pain.   Current Outpatient Medications (Hematological):  .  cyanocobalamin (,VITAMIN B-12,) 1000 MCG/ML injection, Inject 1 mL (1,000 mcg total) into the skin every 30 (thirty) days.  Current Outpatient Medications (Other):  .  escitalopram (LEXAPRO) 20 MG tablet, TAKE 1 TABLET BY MOUTH EVERY DAY .  gabapentin (NEURONTIN) 100 MG capsule, Take 2 capsules (200 mg total) by mouth every 12 (twelve) hours. .  ondansetron (ZOFRAN-ODT) 4 MG disintegrating tablet, Take 1 tablet (4 mg total) by mouth every 6 (six) hours as needed for nausea or vomiting. .  pantoprazole (PROTONIX) 20 MG tablet, Take 1 tablet Daily for Indigestion & Heartburn .  Vitamin D, Ergocalciferol, (DRISDOL) 1.25 MG (50000 UT) CAPS capsule, 1 pill 3 days a week for vitamin d deficiency .  VOLTAREN 1 % GEL, Apply 2 g topically 4 (four) times daily as needed (joint pain).  Medical History:  Past Medical History:  Diagnosis Date  . Allergy   . Anemia   . Arthritis    "knees, hands, ankles, ~ every joint" (04/08/2015)  . Basal cell carcinoma    "face, hands, chest" (04/08/2015)  . Chronic bronchitis ([place])    "get it pretty much q yr" (04/08/2015)  . Dysrhythmia    palpitations occ; SVT 09/2013 s/p adenosine  . GERD (gastroesophageal reflux disease)   . H/O hiatal hernia   . Hx of cardiovascular stress test    Lexiscan Myoview 6/16: Ejection fraction is 60% and wall motion is normal. The study is normal. There is no scar or ischemia. This is a low risk scan.  . Hyperlipidemia   . Hypertension   . Hypothyroidism   . Palpitations   . Pneumonia 2015 X 1  . PONV (postoperative nausea and vomiting)   . Pre-diabetes   . S/P total knee arthroplasty 02/15/2014  . Sleep apnea   . Thyroid disease   . Vitamin D deficiency     Allergies Allergies  Allergen Reactions  . Darvon [Propoxyphene Hcl] Anaphylaxis and Other (See Comments)    Airway swelling   . Propoxyphene Anaphylaxis    Throat closes up  . Doxycycline  Nausea Only  . Tamiflu [Oseltamivir Phosphate] Hives and Other (See Comments)    Increased blood pressure    SURGICAL HISTORY She  has a past surgical history that includes Carpal tunnel release (Right, 1990); Tubal ligation (1987); Knee arthroscopy (Right, 2013); Bladder repair (1993); Endometrial ablation (2009); Excision basal cell carcinoma (2014); Partial knee arthroplasty (Right, 02/15/2014); Cardiac catheterization (N/A, 04/08/2015); Joint replacement; Incontinence surgery (2010); Knee arthroscopy; Cardiac surgery; Colonoscopy; Polypectomy; Colonoscopy with propofol (N/A, 11/25/2017); Cardiac catheterization (2017); Laparoscopic gastric sleeve resection (N/A, 05/26/2019); and Splenectomy, total (05/26/2019). FAMILY HISTORY Her family history includes Atrial fibrillation in her mother; Breast cancer in her paternal aunt; COPD in her father; Diabetes in her father; Emphysema in her father; Heart attack in her maternal grandmother; Heart disease in her maternal grandfather, paternal grandfather, and paternal grandmother; Hyperlipidemia in her maternal grandfather, paternal grandfather, and paternal grandmother; Hypertension in her mother; Stroke in her maternal grandmother. SOCIAL HISTORY She  reports that she quit smoking about 6 months ago. Her smoking use included cigarettes. She has a 60.00 pack-year smoking history. She has never used smokeless tobacco. She reports that she does not drink alcohol or use drugs.   Immunization History  Administered Date(s) Administered  . Influenza-Unspecified 05/14/2015, 05/27/2017, 05/13/2018  . Meningococcal B, OMV 06/19/2019  . Meningococcal Mcv4o 06/19/2019  . PPD Test 12/02/2013  . Pneumococcal Polysaccharide-23 04/08/2012  . Td 10/16/2004  . Tdap 07/24/2017   MAINTENANCE: Colonoscopy: >20 year ago wnl/ overdue EGD: 2011 Mammo: 05/07/12 WNL, 2014 PER PT WNL BMD: NO HX Pap/ Pelvic: GYN 2010? SEG:BTDVVO Dentist:q 6-12  month CXR:09/25/13  IMMUNIZATIONS: TD/Tdap: 2018 Pneumovax:04/08/12 Zostavax:N/A Influenza: 2019  Review of Systems  Constitutional: Negative for chills, diaphoresis, fever, malaise/fatigue and weight loss.  HENT: Negative.   Eyes: Negative.   Respiratory: Negative for cough, shortness of breath and wheezing.   Cardiovascular: Negative for chest pain, palpitations, orthopnea, claudication, leg swelling and PND.  Gastrointestinal: Negative.   Genitourinary: Negative.   Musculoskeletal: Positive for back pain. Negative for falls, joint pain, myalgias and neck pain.  Skin: Negative.   Neurological: Negative for dizziness, tingling, tremors, sensory change, speech change, focal  weakness, seizures, loss of consciousness, weakness and headaches.  Psychiatric/Behavioral: Negative for depression, hallucinations, memory loss, substance abuse and suicidal ideas. The patient is not nervous/anxious and does not have insomnia.     Physical Exam: BP (!) 140/100   Pulse 70   Temp (!) 97.3 F (36.3 C)   Ht 5' 5"  (1.651 m)   Wt 242 lb 9.6 oz (110 kg)   SpO2 98%   BMI 40.37 kg/m  Wt Readings from Last 3 Encounters:  09/01/19 242 lb 9.6 oz (110 kg)  08/13/19 242 lb (109.8 kg)  07/31/19 254 lb (115.2 kg)   General Appearance: Well nourished, in no apparent distress. Eyes: PERRLA, EOMs, conjunctiva no swelling or erythema Sinuses: No Frontal/maxillary tenderness ENT/Mouth: Ext aud canals clear, TMs without erythema, bulging. No erythema, swelling, or exudate on post pharynx.  Tonsils not swollen or erythematous. Hearing normal.  Neck: Supple, thyroid normal.  Respiratory: Respiratory effort normal, BS equal bilaterally with wheezing left lower lung rhonchi, or stridor.  Cardio: RRR with no MRGs. Brisk peripheral pulses with 2+ edema.  Abdomen: Soft, + BS, obese Non tender, no guarding, rebound, hernias, masses. Lymphatics: Non tender without lymphadenopathy.  Musculoskeletal: Full ROM, 5/5  strength, antalgic gait Skin: Warm, dry without rashes, lesions, ecchymosis.  Neuro: Cranial nerves intact. Normal muscle tone, no cerebellar symptoms. Sensation intact.  Psych: Awake and oriented X 3, normal affect, Insight and Judgment appropriate.    Vicie Mutters, PA-C 3:37 PM [facility]

## 2019-09-01 ENCOUNTER — Other Ambulatory Visit: Payer: Self-pay

## 2019-09-01 ENCOUNTER — Encounter: Payer: Self-pay | Admitting: Physician Assistant

## 2019-09-01 ENCOUNTER — Ambulatory Visit: Payer: 59 | Admitting: Physician Assistant

## 2019-09-01 VITALS — BP 140/100 | HR 70 | Temp 97.3°F | Ht 65.0 in | Wt 242.6 lb

## 2019-09-01 DIAGNOSIS — J449 Chronic obstructive pulmonary disease, unspecified: Secondary | ICD-10-CM | POA: Diagnosis not present

## 2019-09-01 DIAGNOSIS — Z9884 Bariatric surgery status: Secondary | ICD-10-CM

## 2019-09-01 DIAGNOSIS — G4736 Sleep related hypoventilation in conditions classified elsewhere: Secondary | ICD-10-CM

## 2019-09-01 DIAGNOSIS — Z23 Encounter for immunization: Secondary | ICD-10-CM | POA: Diagnosis not present

## 2019-09-01 DIAGNOSIS — I471 Supraventricular tachycardia, unspecified: Secondary | ICD-10-CM

## 2019-09-01 DIAGNOSIS — I700 Atherosclerosis of aorta: Secondary | ICD-10-CM

## 2019-09-01 DIAGNOSIS — Z130 Encounter for screening for diseases of the blood and blood-forming organs and certain disorders involving the immune mechanism: Secondary | ICD-10-CM

## 2019-09-01 DIAGNOSIS — M069 Rheumatoid arthritis, unspecified: Secondary | ICD-10-CM

## 2019-09-01 DIAGNOSIS — Z0001 Encounter for general adult medical examination with abnormal findings: Secondary | ICD-10-CM

## 2019-09-01 DIAGNOSIS — R8290 Unspecified abnormal findings in urine: Secondary | ICD-10-CM

## 2019-09-01 DIAGNOSIS — K635 Polyp of colon: Secondary | ICD-10-CM

## 2019-09-01 DIAGNOSIS — I10 Essential (primary) hypertension: Secondary | ICD-10-CM

## 2019-09-01 DIAGNOSIS — E079 Disorder of thyroid, unspecified: Secondary | ICD-10-CM

## 2019-09-01 DIAGNOSIS — G4733 Obstructive sleep apnea (adult) (pediatric): Secondary | ICD-10-CM

## 2019-09-01 DIAGNOSIS — Z87891 Personal history of nicotine dependence: Secondary | ICD-10-CM

## 2019-09-01 DIAGNOSIS — J439 Emphysema, unspecified: Secondary | ICD-10-CM

## 2019-09-01 DIAGNOSIS — Z9081 Acquired absence of spleen: Secondary | ICD-10-CM

## 2019-09-01 DIAGNOSIS — E782 Mixed hyperlipidemia: Secondary | ICD-10-CM

## 2019-09-01 DIAGNOSIS — Z9989 Dependence on other enabling machines and devices: Secondary | ICD-10-CM

## 2019-09-01 DIAGNOSIS — D3502 Benign neoplasm of left adrenal gland: Secondary | ICD-10-CM

## 2019-09-01 DIAGNOSIS — R0683 Snoring: Secondary | ICD-10-CM

## 2019-09-01 DIAGNOSIS — E559 Vitamin D deficiency, unspecified: Secondary | ICD-10-CM

## 2019-09-01 DIAGNOSIS — E538 Deficiency of other specified B group vitamins: Secondary | ICD-10-CM

## 2019-09-01 MED ORDER — MENINGOCOCCAL VAC B (OMV) IM SUSY: 0.5000 mL | PREFILLED_SYRINGE | Freq: Once | INTRAMUSCULAR | 0 refills | Status: AC

## 2019-09-01 MED ORDER — MENINGOCOCCAL A C Y&W-135 OLIG IM SOLR: 0.5000 mL | Freq: Once | INTRAMUSCULAR | 0 refills | Status: AC

## 2019-09-01 MED ORDER — TRIAMCINOLONE ACETONIDE 0.5 % EX CREA: 1.0000 "application " | TOPICAL_CREAM | Freq: Two times a day (BID) | CUTANEOUS | 2 refills | Status: DC

## 2019-09-01 NOTE — Patient Instructions (Addendum)
Try to find a better breakfast during the week.  Try to reach your water goal  If you are informed that you have come in close contact with someone that is positive for COVID-19 we recommend that you get tested due to asymptomatic spread. However, we recommend not testing until day 5 or more of exposure, testing before that time is more likely to be false negative.  What is close contact? -Close contact is closer than 6 feet for a cumulative total of 15 minutes or more over a 24 hour period starting from 2 days before illness onset -you had direct physical contact (hugged or kissed the person) -shared eating or drinking utensils, or they sneezed, coughed  -somehow got respiratory droplets on you.    Stay home for 14 days after your last contact with a person who has COVID-19, watch for signs of COVID-19, and stay away from others, especially people that are higher risk.   Shorter options for Quarantine that have just been released by the CDC are, these are meant to help reduce the time patient's can not work however due to high community spread we still suggest quarantining for 14 days at this time.  -  after 10 days of close contact with someone that is positive - after day 7 after receiving a negative trest result  (testing day 5 or later after the potential exposure) Suggest the PCR send out test which is more accurate than the rapid antigen test.   You will still need to monitor for symptoms for 14 days after exposure and if you have symptoms start to isolate immediately and contact your healthcare provider.  Wear a mask, stay at least 6 feet from others, wash your hands, and avoid crowds.      WATER IS IMPORTANT  Being dehydrated can hurt your kidneys, cause fatigue, headaches, muscle aches, joint pain, and dry skin/nails so please increase your fluids.   Drink 80-100 oz a day of water, measure it out! Eat 3 meals a day, have to do breakfast, eat protein- hard boiled eggs, protein  bar like [place] protein bar, greek yogurt like oikos triple zero, chobani 100, or light n fit greek  Can check out plantnanny app on your phone to help you keep track of your water  General eating tips  What to Avoid . Avoid added sugars o Often added sugar can be found in processed foods such as many condiments, dry cereals, cakes, cookies, chips, crisps, crackers, candies, sweetened drinks, etc.  o Read labels and AVOID/DECREASE use of foods with the following in their ingredient list: Sugar, fructose, high fructose corn syrup, sucrose, glucose, maltose, dextrose, molasses, cane sugar, brown sugar, any type of syrup, agave nectar, etc.   . Avoid snacking in between meals- drink water or if you feel you need a snack, pick a high water content snack such as cucumbers, watermelon, or any veggie.  Marland Kitchen Avoid foods made with flour o If you are going to eat food made with flour, choose those made with whole-grains; and, minimize your consumption as much as is tolerable . Avoid processed foods o These foods are generally stocked in the middle of the grocery store.  o Focus on shopping on the perimeter of the grocery.  What to Include . Vegetables o GREEN LEAFY VEGETABLES: Kale, spinach, mustard greens, collard greens, cabbage, broccoli, etc. o OTHER: Asparagus, cauliflower, eggplant, carrots, peas, Brussel sprouts, tomatoes, bell peppers, zucchini, beets, cucumbers, etc. . Grains, seeds, and legumes o  Beans: kidney beans, black eyed peas, garbanzo beans, black beans, pinto beans, etc. o Whole, unrefined grains: brown rice, barley, bulgur, oatmeal, etc. . Healthy fats  o Avoid highly processed fats such as vegetable oil o Examples of healthy fats: avocado, olives, virgin olive oil, dark chocolate (?72% Cocoa), nuts (peanuts, almonds, walnuts, cashews, pecans, etc.) o Please still do small amount of these healthy fats, they are dense in calories.  . Low - Moderate Intake of Animal Sources of  Protein o Meat sources: chicken, [place], salmon, tuna. Limit to 4 ounces of meat at one time or the size of your palm. o Consider limiting dairy sources, but when choosing dairy focus on: PLAIN [place] yogurt, cottage cheese, high-protein milk . Fruit o Choose berries

## 2019-09-03 ENCOUNTER — Other Ambulatory Visit: Payer: Self-pay | Admitting: Physician Assistant

## 2019-09-03 LAB — URINALYSIS, ROUTINE W REFLEX MICROSCOPIC
Bilirubin Urine: NEGATIVE
Glucose, UA: NEGATIVE
Hgb urine dipstick: NEGATIVE
Hyaline Cast: NONE SEEN /LPF
Nitrite: NEGATIVE
Specific Gravity, Urine: 1.033 (ref 1.001–1.03)
pH: 5.5 (ref 5.0–8.0)

## 2019-09-03 LAB — MICROALBUMIN / CREATININE URINE RATIO
Creatinine, Urine: 257 mg/dL (ref 20–275)
Microalb Creat Ratio: 12 mcg/mg creat (ref <30)
Microalb, Ur: 3.1 mg/dL

## 2019-09-03 LAB — URINE CULTURE
MICRO NUMBER:: 10057333
SPECIMEN QUALITY:: ADEQUATE

## 2019-09-03 LAB — IRON, TOTAL/TOTAL IRON BINDING CAP
%SAT: 9 % (calc) — ABNORMAL LOW (ref 16–45)
Iron: 37 ug/dL — ABNORMAL LOW (ref 45–160)
TIBC: 430 mcg/dL (calc) (ref 250–450)

## 2019-09-03 LAB — VITAMIN B12: Vitamin B-12: 330 pg/mL (ref 200–1100)

## 2019-09-03 LAB — FERRITIN: Ferritin: 23 ng/mL (ref 16–232)

## 2019-09-03 LAB — TSH: TSH: 29.99 mIU/L — ABNORMAL HIGH (ref 0.40–4.50)

## 2019-09-03 MED ORDER — AMOXICILLIN-POT CLAVULANATE 250-125 MG PO TABS: 1.0000 | ORAL_TABLET | Freq: Two times a day (BID) | ORAL | 0 refills | Status: DC

## 2019-09-10 ENCOUNTER — Other Ambulatory Visit: Payer: Self-pay

## 2019-09-10 DIAGNOSIS — E538 Deficiency of other specified B group vitamins: Secondary | ICD-10-CM

## 2019-09-10 MED ORDER — CYANOCOBALAMIN 1000 MCG/ML IJ SOLN: 1000.0000 ug | INTRAMUSCULAR | 3 refills | Status: DC

## 2019-10-06 ENCOUNTER — Encounter (HOSPITAL_COMMUNITY): Payer: Self-pay | Admitting: Emergency Medicine

## 2019-10-06 ENCOUNTER — Emergency Department (HOSPITAL_COMMUNITY)
Admission: EM | Admit: 2019-10-06 | Discharge: 2019-10-06 | Disposition: A | Discharge status: Home / Self Care | Payer: 59 | Attending: Emergency Medicine | Admitting: Emergency Medicine

## 2019-10-06 ENCOUNTER — Other Ambulatory Visit: Payer: Self-pay

## 2019-10-06 DIAGNOSIS — I10 Essential (primary) hypertension: Secondary | ICD-10-CM | POA: Insufficient documentation

## 2019-10-06 DIAGNOSIS — J449 Chronic obstructive pulmonary disease, unspecified: Secondary | ICD-10-CM | POA: Insufficient documentation

## 2019-10-06 DIAGNOSIS — E039 Hypothyroidism, unspecified: Secondary | ICD-10-CM | POA: Diagnosis not present

## 2019-10-06 DIAGNOSIS — H81399 Other peripheral vertigo, unspecified ear: Secondary | ICD-10-CM

## 2019-10-06 DIAGNOSIS — Z87891 Personal history of nicotine dependence: Secondary | ICD-10-CM | POA: Diagnosis not present

## 2019-10-06 DIAGNOSIS — R42 Dizziness and giddiness: Secondary | ICD-10-CM | POA: Insufficient documentation

## 2019-10-06 LAB — BASIC METABOLIC PANEL
Anion gap: 14 (ref 5–15)
BUN: 12 mg/dL (ref 6–20)
CO2: 22 mmol/L (ref 22–32)
Calcium: 9 mg/dL (ref 8.9–10.3)
Chloride: 100 mmol/L (ref 98–111)
Creatinine, Ser: 1.21 mg/dL — ABNORMAL HIGH (ref 0.44–1.00)
GFR calc Af Amer: 58 mL/min — ABNORMAL LOW (ref >60)
GFR calc non Af Amer: 50 mL/min — ABNORMAL LOW (ref >60)
Glucose, Bld: 122 mg/dL — ABNORMAL HIGH (ref 70–99)
Potassium: 3.5 mmol/L (ref 3.5–5.1)
Sodium: 136 mmol/L (ref 135–145)

## 2019-10-06 LAB — CBC
HCT: 36.3 % (ref 36.0–46.0)
Hemoglobin: 11.8 g/dL — ABNORMAL LOW (ref 12.0–15.0)
MCH: 30.8 pg (ref 26.0–34.0)
MCHC: 32.5 g/dL (ref 30.0–36.0)
MCV: 94.8 fL (ref 80.0–100.0)
Platelets: 300 10*3/uL (ref 150–400)
RBC: 3.83 MIL/uL — ABNORMAL LOW (ref 3.87–5.11)
RDW: 16.1 % — ABNORMAL HIGH (ref 11.5–15.5)
WBC: 8.7 10*3/uL (ref 4.0–10.5)
nRBC: 0.5 % — ABNORMAL HIGH (ref 0.0–0.2)

## 2019-10-06 MED ORDER — SODIUM CHLORIDE 0.9 % IV BOLUS
1000.0000 mL | Freq: Once | INTRAVENOUS | Status: AC
  Administered 2019-10-06: 1000 mL via INTRAVENOUS

## 2019-10-06 MED ORDER — MECLIZINE HCL 25 MG PO TABS: 25.0000 mg | ORAL_TABLET | Freq: Three times a day (TID) | ORAL | 0 refills | Status: DC | PRN

## 2019-10-06 MED ORDER — MECLIZINE HCL 25 MG PO TABS
25.0000 mg | ORAL_TABLET | Freq: Once | ORAL | Status: AC
  Administered 2019-10-06: 25 mg via ORAL
  Filled 2019-10-06: qty 1

## 2019-10-06 NOTE — ED Triage Notes (Signed)
Per [facility] pt coming from work c/o weakness and dizziness worsening over the past week. Patient had beginning of month and since then has had decreased oral intake. Negative covid test on the 16th and denies any symptoms since.

## 2019-10-06 NOTE — Discharge Instructions (Signed)
You were seen in the emergency department today with vertigo-like symptoms.  Please drink plenty of fluids and take the meclizine as needed for spinning.  If you develop more constant spinning, weakness, numbness, change in speech, vision change, this could be a sign of more serious issue and you should return to the emergency department immediately and/or call 911 for transport.  Please follow-up with your primary care doctor along with the ear nose and throat doctor should symptoms continue but the more mild or sporadic.

## 2019-10-06 NOTE — ED Provider Notes (Signed)
Emergency Department Provider Note   I have reviewed the triage vital signs and the nursing notes.   HISTORY  Chief Complaint Dizziness   HPI ICLE MCCLARD is a 59 y.o. female with past medical history reviewed below including prior vertigo symptoms presents emergency department with an episode of vertigo which lasted longer than normal.  She describes symptoms worse with movement and standing.  No symptoms at rest.  She denies any speech disturbance or vision change.  She sat down after experiencing her spinning type feeling which typically helps symptoms to resolve but this time it lasted for approximately 10 minutes.  She does intermittently have some ear ringing but denies pain.  No active symptoms at this time.  She does not take all medications for vertigo.  No fevers, chills, headaches.  No chest pain or palpitations.  No syncope or near syncope type symptoms.  No radiation or other modifying factors.   Past Medical History:  Diagnosis Date  . Allergy   . Anemia   . Arthritis    "knees, hands, ankles, ~ every joint" (04/08/2015)  . Basal cell carcinoma    "face, hands, chest" (04/08/2015)  . Chronic bronchitis ([place])    "get it pretty much q yr" (04/08/2015)  . Dysrhythmia    palpitations occ; SVT 09/2013 s/p adenosine  . GERD (gastroesophageal reflux disease)   . H/O hiatal hernia   . Hx of cardiovascular stress test    Lexiscan Myoview 6/16: Ejection fraction is 60% and wall motion is normal. The study is normal. There is no scar or ischemia. This is a low risk scan.  . Hyperlipidemia   . Hypertension   . Hypothyroidism   . Palpitations   . Pneumonia 2015 X 1  . PONV (postoperative nausea and vomiting)   . Pre-diabetes   . S/P total knee arthroplasty 02/15/2014  . Sleep apnea   . Thyroid disease   . Vitamin D deficiency     Patient Active Problem List   Diagnosis Date Noted  . H/O splenectomy 06/17/2019  . Morbid obesity ([place]) 05/26/2019  . S/P laparoscopic  sleeve gastrectomy 05/26/2019  . Former smoker 03/25/2019  . OSA on CPAP 09/16/2018  . Nocturnal hypoxemia due to emphysema ([place]) 09/16/2018  . Snoring 09/16/2018  . History of colonic polyps   . Polyp of cecum   . Aortic atherosclerosis ([place]) 09/02/2017  . Adrenal adenoma 09/02/2017  . External hemorrhoid, bleeding 08/14/2017  . Rectal pain 08/14/2017  . Rheumatoid arthritis ([place]) 07/24/2017  . COPD GOLD 0/ former smoker at risk 07/24/2017  . SVT (supraventricular tachycardia) ([place]) 04/08/2015  . PSVT (paroxysmal supraventricular tachycardia) ([place]) 01/03/2015  . Hypertension   . Hyperlipidemia   . Thyroid disease   . Vitamin D deficiency     Past Surgical History:  Procedure Laterality Date  . BASAL CELL CARCINOMA EXCISION  2014   "off my chest"  . BLADDER REPAIR  1993   "lasered the holes shut"  . CARDIAC CATHETERIZATION  2017  . CARDIAC SURGERY    . CARPAL TUNNEL RELEASE Right 1990  . COLONOSCOPY    . COLONOSCOPY WITH PROPOFOL N/A 11/25/2017   Procedure: COLONOSCOPY WITH PROPOFOL;  Surgeon: Mauri Pole, MD;  Location: [facility];  Service: Endoscopy;  Laterality: N/A;  . ELECTROPHYSIOLOGIC STUDY N/A 04/08/2015   Procedure: SVT Ablation;  Surgeon: Evans Lance, MD;  Location: [facility];  Service: Cardiovascular;  Laterality: N/A;  . ENDOMETRIAL ABLATION  2009  .  INCONTINENCE SURGERY  2010  . JOINT REPLACEMENT    . KNEE ARTHROSCOPY Right 2013  . KNEE ARTHROSCOPY    . LAPAROSCOPIC GASTRIC SLEEVE RESECTION N/A 05/26/2019   Procedure: LAPAROSCOPIC GASTRIC SLEEVE RESECTION, Upper Endo, ERAS Pathway;  Surgeon: Greer Pickerel, MD;  Location: [facility];  Service: General;  Laterality: N/A;  . PARTIAL KNEE ARTHROPLASTY Right 02/15/2014   Procedure: RIGHT UNICOMPARTMENTAL KNEE (MEDIAL COMPARTMENT);  Surgeon: Vickey Huger, MD;  Location: [facility];  Service: Orthopedics;  Laterality: Right;  . POLYPECTOMY    . SPLENECTOMY, TOTAL  05/26/2019   Procedure: SPLENECTOMY;   Surgeon: Greer Pickerel, MD;  Location: [facility];  Service: General;;  . TUBAL LIGATION  1987    Allergies Darvon [propoxyphene hcl], Propoxyphene, Doxycycline, and Tamiflu [oseltamivir phosphate]  Family History  Problem Relation Age of Onset  . Hypertension Mother   . Atrial fibrillation Mother   . Diabetes Father   . Emphysema Father   . COPD Father   . Breast cancer Paternal Aunt        x 4 aunts,   . Stroke Maternal Grandmother   . Heart attack Maternal Grandmother   . Hyperlipidemia Maternal Grandfather   . Heart disease Maternal Grandfather   . Hyperlipidemia Paternal Grandmother   . Heart disease Paternal Grandmother   . Hyperlipidemia Paternal Grandfather   . Heart disease Paternal Grandfather   . Colon cancer Neg Hx   . Colon polyps Neg Hx   . Esophageal cancer Neg Hx   . Rectal cancer Neg Hx   . Ulcerative colitis Neg Hx   . Stomach cancer Neg Hx     Social History Social History   Tobacco Use  . Smoking status: Former Smoker    Packs/day: 1.50    Years: 40.00    Pack years: 60.00    Types: Cigarettes    Quit date: 02/2019    Years since quitting: 0.6  . Smokeless tobacco: Never Used  . Tobacco comment: 04/08/2015 "went from ~ 2 ppd to 3 cigarettes in 1 week"  Substance Use Topics  . Alcohol use: No    Alcohol/week: 0.0 standard drinks    Comment: 04/08/2015 "couple drinks/year"  . Drug use: No    Review of Systems  Constitutional: No fever/chills Eyes: No visual changes. ENT: No sore throat. Positive vertigo.  Cardiovascular: Denies chest pain. Respiratory: Denies shortness of breath. Gastrointestinal: No abdominal pain. No nausea, no vomiting. No diarrhea.  No constipation. Genitourinary: Negative for dysuria. Musculoskeletal: Negative for back pain. Skin: Negative for rash. Neurological: Negative for headaches, focal weakness or numbness.  10-point ROS otherwise negative.  ____________________________________________   PHYSICAL  EXAM:  VITAL SIGNS: ED Triage Vitals  Enc Vitals Group     BP 10/06/19 1144 107/73     Pulse Rate 10/06/19 1144 63     Resp 10/06/19 1144 16     Temp 10/06/19 1144 98.2 F (36.8 C)     Temp Source 10/06/19 1144 Oral     SpO2 10/06/19 1144 100 %     Weight 10/06/19 1147 217 lb (98.4 kg)     Height 10/06/19 1147 5' 5.5" (1.664 m)   Constitutional: Alert and oriented. Well appearing and in no acute distress. Eyes: Conjunctivae are normal. EOMI. Negative Marye Round.  Head: Atraumatic. Nose: No congestion/rhinnorhea. Mouth/Throat: Mucous membranes are moist.   Neck: No stridor.   Cardiovascular: Normal rate, regular rhythm. Good peripheral circulation. Grossly normal heart sounds.   Respiratory: Normal respiratory effort.  No retractions. Lungs CTAB. Gastrointestinal: Soft and nontender. No distention.  Musculoskeletal: No lower extremity tenderness nor edema. No gross deformities of extremities. Neurologic:  Normal speech and language. No gross focal neurologic deficits are appreciated.  No cranial nerve deficit appreciated.  No pronator drift.  Normal finger-to-nose and heel-to-shin.  Normal gait.  Skin:  Skin is warm, dry and intact. No rash noted.   ____________________________________________   LABS (all labs ordered are listed, but only abnormal results are displayed)  Labs Reviewed  BASIC METABOLIC PANEL - Abnormal; Notable for the following components:      Result Value   Glucose, Bld 122 (*)    Creatinine, Ser 1.21 (*)    GFR calc non Af Amer 50 (*)    GFR calc Af Amer 58 (*)    All other components within normal limits  CBC - Abnormal; Notable for the following components:   RBC 3.83 (*)    Hemoglobin 11.8 (*)    RDW 16.1 (*)    nRBC 0.5 (*)    All other components within normal limits   ____________________________________________  EKG   EKG Interpretation  Date/Time:  Tuesday October 06 2019 11:50:47 EST Ventricular Rate:  65 PR Interval:  116 QRS  Duration: 92 QT Interval:  484 QTC Calculation: 503 R Axis:   -39 Text Interpretation: Normal sinus rhythm Left axis deviation ST & T wave abnormality, consider anterolateral ischemia Prolonged QT Abnormal ECG Interpretation limited secondary to artifact Confirmed by Nanda Quinton [phone]) on 10/06/2019 3:39:36 PM      ____________________________________________   PROCEDURES  Procedure(s) performed:   Procedures  None  ____________________________________________   INITIAL IMPRESSION / ASSESSMENT AND PLAN / ED COURSE  Pertinent labs & imaging results that were available during my care of the patient were reviewed by me and considered in my medical decision making (see chart for details).   Patient presents to the emergency department with likely peripheral vertigo type symptoms.  No focal neurologic deficits on exam to suspect central etiology.  Her lab work shows mild dehydration with creatinine of 1.21 with normal BUN.  Plan for IV fluids and meclizine here.  I do not feel she requires emergency MRI.  Plan for outpatient PCP follow-up along with ENT if symptoms become more persistent.    ____________________________________________  FINAL CLINICAL IMPRESSION(S) / ED DIAGNOSES  Final diagnoses:  Peripheral vertigo, unspecified laterality     MEDICATIONS GIVEN DURING THIS VISIT:  Medications  sodium chloride 0.9 % bolus 1,000 mL (0 mLs Intravenous Stopped 10/06/19 1753)  meclizine (ANTIVERT) tablet 25 mg (25 mg Oral Given 10/06/19 1711)     NEW OUTPATIENT MEDICATIONS STARTED DURING THIS VISIT:  Discharge Medication List as of 10/06/2019  5:41 PM    START taking these medications   Details  meclizine (ANTIVERT) 25 MG tablet Take 1 tablet (25 mg total) by mouth 3 (three) times daily as needed for dizziness., Starting Tue 10/06/2019, Normal        Note:  This document was prepared using Dragon voice recognition software and may include unintentional dictation  errors.  Nanda Quinton, MD, [facility]    Raimi Guillermo, [facility], MD 10/06/19 [phone]

## 2019-10-12 ENCOUNTER — Other Ambulatory Visit: Payer: Self-pay | Admitting: Chiropractic Medicine

## 2019-10-12 DIAGNOSIS — M542 Cervicalgia: Secondary | ICD-10-CM

## 2019-10-22 ENCOUNTER — Ambulatory Visit: Payer: 59 | Admitting: Adult Health

## 2019-10-22 ENCOUNTER — Encounter: Payer: Self-pay | Admitting: Adult Health

## 2019-10-22 ENCOUNTER — Other Ambulatory Visit: Payer: Self-pay

## 2019-10-22 VITALS — BP 130/88 | HR 61 | Temp 97.7°F | Wt 225.0 lb

## 2019-10-22 DIAGNOSIS — J019 Acute sinusitis, unspecified: Secondary | ICD-10-CM | POA: Diagnosis not present

## 2019-10-22 MED ORDER — PREDNISONE 20 MG PO TABS: ORAL_TABLET | ORAL | 0 refills | Status: DC

## 2019-10-22 NOTE — Patient Instructions (Addendum)
    Switch back to claritin, allegra, zyrtec  Try saline nasal irrigations, continue fluticasone until you start prednisone   Hold off of prednisone until 24-48 hours after covid 19 vaccine      HOW TO TREAT VIRAL COUGH AND COLD SYMPTOMS:  -Symptoms usually last at least 1 week with the worst symptoms being around day 4.  - colds usually start with a sore throat and end with a cough, and the cough can take 2 weeks to get better.  -No antibiotics are needed for colds, flu, sore throats, cough, bronchitis UNLESS symptoms are longer than 7 days OR if you are getting better then get drastically worse.  -There are a lot of combination medications (Dayquil, Nyquil, Vicks 44, tyelnol cold and sinus, ETC). Please look at the ingredients on the back so that you are treating the correct symptoms and not doubling up on medications/ingredients.    Medicines you can use  Nasal congestion  Little Remedies saline spray (aerosol/mist)- can try this, it is in the kids section - pseudoephedrine (Sudafed)- behind the counter, do not use if you have high blood pressure, medicine that have -D in them.  - phenylephrine (Sudafed PE) -Dextormethorphan + chlorpheniramine (Coridcidin HBP)- okay if you have high blood pressure -Oxymetazoline (Afrin) nasal spray- LIMIT to 3 days -Saline nasal spray -Neti pot (used distilled or bottled water)  Ear pain/congestion  -pseudoephedrine (sudafed) - Nasonex/flonase nasal spray  Fever  -Acetaminophen (Tyelnol) -Ibuprofen (Advil, motrin, aleve)  Sore Throat  -Acetaminophen (Tyelnol) -Ibuprofen (Advil, motrin, aleve) -Drink a lot of water -Gargle with salt water - Rest your voice (don't talk) -Throat sprays -Cough drops  Body Aches  -Acetaminophen (Tyelnol) -Ibuprofen (Advil, motrin, aleve)  Headache  -Acetaminophen (Tyelnol) -Ibuprofen (Advil, motrin, aleve) - Exedrin, Exedrin Migraine  Allergy symptoms (cough, sneeze, runny nose, itchy  eyes) -Claritin or loratadine cheapest but likely the weakest  -Zyrtec or certizine at night because it can make you sleepy -The strongest is allegra or fexafinadine  Cheapest at [employer], [employer], [employer]  Cough  -Dextromethorphan (Delsym)- medicine that has DM in it -Guafenesin (Mucinex/Robitussin) - cough drops - drink lots of water  Chest Congestion  -Guafenesin (Mucinex/Robitussin)  Red Itchy Eyes  - Naphcon-A  Upset Stomach  - Bland diet (nothing spicy, greasy, fried, and high acid foods like tomatoes, oranges, berries) -OKAY- cereal, bread, soup, crackers, rice -Eat smaller more frequent meals -reduce caffeine, no alcohol -Loperamide (Imodium-AD) if diarrhea -Prevacid for heart burn  General health when sick  -Hydration -wash your hands frequently -keep surfaces clean -change pillow cases and sheets often -Get fresh air but do not exercise strenuously -Vitamin D, double up on it - Vitamin C -Zinc

## 2019-10-22 NOTE — Progress Notes (Signed)
Assessment and Plan:  Manuelita was seen today for sinusitis.  Diagnoses and all orders for this visit:  Acute rhinosinusitis Benign exam; MIld persistent sx, likely r/t poorly controlled allergies/inflammatoin considering duration Discussed the importance of avoiding unnecessary antibiotic therapy. Suggested symptomatic OTC remedies. Nasal saline spray for congestion. Nasal steroids, rotate allergy pill, oral steroids offered Follow up as needed, if worsening sx, fever/chills, progressive cough or other concerns.  She is in agreement with above plan.  Advised to hold off on prednisone until 24-48 hours after vaccine.  -     predniSONE (DELTASONE) 20 MG tablet; 2 tablets daily for 3 days, 1 tablet daily for 4 days.  Further disposition pending results of labs. Discussed med's effects and SE's.   Over 15 minutes of exam, counseling, chart review, and critical decision making was performed.   Future Appointments  Date Time Provider [place]  10/23/2019 11:15 AM [place] [facility]  11/23/2019  4:00 PM [name], RD [place] [facility]  12/02/2019  3:30 PM Vicie Mutters, PA-C [name] None  08/31/2020  3:00 PM Vicie Mutters, PA-C [name] None    ------------------------------------------------------------------------------------------------------------------   HPI BP 130/88   Pulse 61   Temp 97.7 F (36.5 C)   Wt 225 lb (102.1 kg)   SpO2 97%   BMI 36.87 kg/m   59 y.o.female with hx of covid 19 and seasonal allergies presents for evaluation of sinus symptoms x 3-4 weeks.   She reports was diagnosed with covid 19 on 09/19/2019, general sx improved and had negative 09/30/2019, returned to work, but presents for evaluation due to persistent sinus pressure, sinus headache, nasal congestion which is persistent and not improved, though not particularly worse. Denies fever/chills, sinus pain, ear pain, dizziness, tooth/jaw pain. She endorse Has recently  in the last week she has developed some minimally productive cough, Denies CP, dyspnea, wheezing. Mild sore throat only in AM but resolves. Endorses persistent nasal drip, worse at night.   She is taking benadryl 2 tabs daily for many years. Recently started fluticasone which has helped some since yesterday. Has taken some tylenol and coricidin. Getting covid 19 vaccine in 2 days.   Past Medical History:  Diagnosis Date  . Allergy   . Anemia   . Arthritis    "knees, hands, ankles, ~ every joint" (04/08/2015)  . Basal cell carcinoma    "face, hands, chest" (04/08/2015)  . Chronic bronchitis ([place])    "get it pretty much q yr" (04/08/2015)  . Dysrhythmia    palpitations occ; SVT 09/2013 s/p adenosine  . GERD (gastroesophageal reflux disease)   . H/O hiatal hernia   . Hx of cardiovascular stress test    Lexiscan Myoview 6/16: Ejection fraction is 60% and wall motion is normal. The study is normal. There is no scar or ischemia. This is a low risk scan.  . Hyperlipidemia   . Hypertension   . Hypothyroidism   . Palpitations   . Pneumonia 2015 X 1  . PONV (postoperative nausea and vomiting)   . Pre-diabetes   . S/P total knee arthroplasty 02/15/2014  . Sleep apnea   . Thyroid disease   . Vitamin D deficiency      Allergies  Allergen Reactions  . Darvon [Propoxyphene Hcl] Anaphylaxis and Other (See Comments)    Airway swelling   . Propoxyphene Anaphylaxis    Throat closes up  . Doxycycline Nausea Only  . Tamiflu [Oseltamivir Phosphate] Hives and Other (See Comments)  Increased blood pressure    Current Outpatient Medications on File Prior to Visit  Medication Sig  . Abatacept (ORENCIA IV) Inject 7.5 mLs into the skin every 30 (thirty) days.  Marland Kitchen acetaminophen (TYLENOL) 650 MG CR tablet Take 650 mg by mouth 3 (three) times daily as needed for pain.  . bisoprolol-hydrochlorothiazide (ZIAC) 5-6.25 MG tablet Take 1 tablet Daily for BP  . cyanocobalamin (,VITAMIN B-12,) 1000 MCG/ML  injection Inject 1 mL (1,000 mcg total) into the skin every 30 (thirty) days.  . diphenhydrAMINE (BENADRYL) 25 MG tablet Take 25 mg by mouth daily.   Marland Kitchen escitalopram (LEXAPRO) 20 MG tablet TAKE 1 TABLET BY MOUTH EVERY DAY  . folic acid (FOLVITE) 1 MG tablet Take 1 mg by mouth daily.  . furosemide (LASIX) 40 MG tablet Take 1 tablet (40 mg total) by mouth daily.  Marland Kitchen levothyroxine (SYNTHROID) 137 MCG tablet Take 1 tablet daily on an empty stomach with only water for 30 minutes & no Antacid meds, Calcium or Magnesium for 4 hours & avoid Biotin  . meclizine (ANTIVERT) 25 MG tablet Take 1 tablet (25 mg total) by mouth 3 (three) times daily as needed for dizziness.  . Menthol, Topical Analgesic, (BIOFREEZE ROLL-ON EX) Apply topically daily as needed.  . methotrexate 50 MG/2ML injection Inject 2 mLs into the muscle once a week.   . olmesartan (BENICAR) 20 MG tablet TAKE 2 TABLETS (40MG ) BY MOUTH DAILY (Patient taking differently: Take 40 mg by mouth daily. )  . pantoprazole (PROTONIX) 20 MG tablet Take 1 tablet Daily for Indigestion & Heartburn (Patient taking differently: Take 20 mg by mouth daily as needed for heartburn or indigestion. Take 1 tablet Daily for Indigestion & Heartburn)  . triamcinolone cream (KENALOG) 0.5 % Apply 1 application topically 2 (two) times daily.  . Vitamin D, Ergocalciferol, (DRISDOL) 1.25 MG (50000 UT) CAPS capsule 1 pill 3 days a week for vitamin d deficiency  . VOLTAREN 1 % GEL Apply 2 g topically 4 (four) times daily as needed (joint pain).   No current facility-administered medications on file prior to visit.    ROS: all negative except above.   Physical Exam:  BP 130/88   Pulse 61   Temp 97.7 F (36.5 C)   Wt 225 lb (102.1 kg)   SpO2 97%   BMI 36.87 kg/m   General Appearance: Well nourished, in no apparent distress. Eyes: PERRLA, conjunctiva no swelling or erythema Sinuses: No Frontal/maxillary tenderness, endorses fullness and pressure ENT/Mouth: Ext aud  canals clear, TMs without erythema, bulging. No erythema, swelling, or exudate on post pharynx.  Tonsils not swollen or erythematous. Hearing normal.  Neck: Supple, thyroid normal.  Respiratory: Respiratory effort normal, BS equal bilaterally without rales, rhonchi, wheezing or stridor.  Cardio: RRR with no MRGs. Brisk peripheral pulses without edema.  Abdomen: Soft, + BS.  Non tender. Lymphatics: Non tender without lymphadenopathy.  Musculoskeletal: normal gait.  Skin: Warm, dry without rashes, lesions, ecchymosis.  Neuro: Cranial nerves intact. Normal muscle tone Psych: Awake and oriented X 3, normal affect, Insight and Judgment appropriate.     Izora Ribas, NP 9:44 AM [facility]

## 2019-10-23 ENCOUNTER — Ambulatory Visit: Payer: 59 | Attending: Internal Medicine

## 2019-10-23 DIAGNOSIS — Z23 Encounter for immunization: Secondary | ICD-10-CM

## 2019-10-23 NOTE — Progress Notes (Signed)
   Covid-19 Vaccination Clinic  Name:  Sherry Dennis    MRN: [id number] DOB: 11-Apr-1961  10/23/2019  [name] was observed post Covid-19 immunization for 15 minutes without incident. She was provided with Vaccine Information Sheet and instruction to access the V-Safe system.   Ms. Reveal was instructed to call 911 with any severe reactions post vaccine: Marland Kitchen Difficulty breathing  . Swelling of face and throat  . A fast heartbeat  . A bad rash all over body  . Dizziness and weakness   Immunizations Administered    Name Date Dose VIS Date Route   Pfizer COVID-19 Vaccine 10/23/2019 10:57 AM 0.3 mL 07/24/2019 Intramuscular   Manufacturer: [place]   Lot: [id number]   [place]: [id number]

## 2019-11-01 ENCOUNTER — Other Ambulatory Visit: Payer: Self-pay | Admitting: Physician Assistant

## 2019-11-01 DIAGNOSIS — E559 Vitamin D deficiency, unspecified: Secondary | ICD-10-CM

## 2019-11-18 ENCOUNTER — Ambulatory Visit: Payer: 59 | Attending: Internal Medicine

## 2019-11-18 DIAGNOSIS — Z23 Encounter for immunization: Secondary | ICD-10-CM

## 2019-11-18 NOTE — Progress Notes (Signed)
   Covid-19 Vaccination Clinic  Name:  Sherry Dennis    MRN: [id number] DOB: Aug 17, 1960  11/18/2019  Sherry Dennis was observed post Covid-19 immunization for 15 minutes without incident. She was provided with Vaccine Information Sheet and instruction to access the V-Safe system.   Sherry Dennis was instructed to call 911 with any severe reactions post vaccine: Marland Kitchen Difficulty breathing  . Swelling of face and throat  . A fast heartbeat  . A bad rash all over body  . Dizziness and weakness   Immunizations Administered    Name Date Dose VIS Date Route   Pfizer COVID-19 Vaccine 11/18/2019  9:52 AM 0.3 mL 07/24/2019 Intramuscular   Manufacturer: [place]   Lot: [id number]   [place]: [id number]

## 2019-11-23 ENCOUNTER — Ambulatory Visit: Payer: 59 | Admitting: Skilled Nursing Facility1

## 2019-11-26 ENCOUNTER — Other Ambulatory Visit: Payer: Self-pay | Admitting: Internal Medicine

## 2019-11-30 NOTE — Progress Notes (Deleted)
COMPLETE PHYSICAL  Assessment and Plan:   Aortic atherosclerosis (HCC) Control blood pressure, cholesterol, glucose, increase exercise.   PSVT (paroxysmal supraventricular tachycardia) (HCC) Continue cardio follow up  SVT (supraventricular tachycardia) (HCC) Continue cardio follow up  COPD GOLD 0/ former smoker at risk No triggers, well controlled symptoms, cont to monitor  Nocturnal hypoxemia due to emphysema (HCC) Monitor  Rheumatoid arthritis, involving unspecified site, unspecified whether rheumatoid factor present ([place]) Continue follow up   Morbid obesity ([place]) - follow up 3 months for progress monitoring - increase veggies, decrease carbs - long discussion about weight loss, diet, and exercise - reach water goal, add in exercise.   Mixed hyperlipidemia check lipids decrease fatty foods increase activity.   Thyroid disease Hypothyroidism-check TSH level, continue medications the same, reminded to take on an empty stomach 30-71mns before food.  -     TSH  Vitamin D deficiency Continue supplement  Essential hypertension -     Urinalysis, Routine w reflex microscopic -     Microalbumin / creatinine urine ratio - continue medications, DASH diet, exercise and monitor at home. Call if greater than 130/80.   Continue diet and meds as discussed. Further disposition pending results of labs. Future Appointments  Date Time Provider DDouglas 12/02/2019  3:30 PM CVicie Mutters PA-C [name] None  08/31/2020  3:00 PM CVicie Mutters PA-C [name] None    HPI 59y.o. female  presents for 3 month follow up with hypertension, hyperlipidemia, prediabetes and vitamin D.  Her blood pressure has been controlled at home, her BP is doing better with ziac 547m Benicar 2066mnd lasix pill BUT she forgot her medications today, today their BP is     Patient has history of ablation for SVT with Dr. NisLajuana Ripple 2016.  She has a history of RA.  Following with Dr.  BeeAmil Amen She is following with Derm on 20 mg of doxy.   She had the gastric sleeve Oct 13th, highest weight was 273. She has gone back to smoking, a few everyday. States her knees are better.  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 She has not met her fluid intake, she has met her protein intake.  Wt Readings from Last 3 Encounters:  10/22/19 225 lb (102.1 kg)  10/06/19 217 lb (98.4 kg)  09/01/19 242 lb 9.6 oz (110 kg)   She is on 57m10m lexapro and she is doing better. She does not workout.  She denies chest pain, shortness of breath, dizziness.  She is not on cholesterol medication, family history of MI, GM at 74, 10m at 79 a40 denies myalgias. Had recent Stress test 03/2019, normal Echo, did show LVH.  Her cholesterol is not at goal. The cholesterol last visit was:   Lab Results  Component Value Date   CHOL 187 08/13/2019   HDL 38 (L) 08/13/2019   LDLCALC 117 (H) 08/13/2019   TRIG 199 (H) 08/13/2019   CHOLHDL 4.9 08/13/2019   She has been working on diet and exercise for prediabetes, and denies paresthesia of the feet, polydipsia and polyuria. Last A1C in the office was:  Lab Results  Component Value Date   HGBA1C 5.8 (H) 08/13/2019   Patient is on Vitamin D supplement, she is on 50,000, 3 days a week- not taking well.    Lab Results  Component Value Date   VD25OH  33 08/13/2019     She is on thyroid medication. Her medication is on 149mg daily with water 1 hour before food.- she admits she was off schedule but she is back on it since last visit.  Lab Results  Component Value Date   TSH 29.99 (H) 09/01/2019  .  She has a B12 def, on B12 injections, she has taken in Dec and Jan.   Lab Results  Component Value Date   VITAMINB12 330 09/01/2019    Current Medications:   Current Outpatient Medications (Endocrine & Metabolic):  .  levothyroxine (SYNTHROID) 137 MCG  tablet, Take 1 tablet daily on an empty stomach with only water for 30 minutes & no Antacid meds, Calcium or Magnesium for 4 hours & avoid Biotin .  predniSONE (DELTASONE) 20 MG tablet, 2 tablets daily for 3 days, 1 tablet daily for 4 days.  Current Outpatient Medications (Cardiovascular):  .  bisoprolol-hydrochlorothiazide (ZIAC) 5-6.25 MG tablet, TAKE 1 TABLET DAILY FOR BLOOD PRESSURE .  furosemide (LASIX) 40 MG tablet, Take 1 tablet (40 mg total) by mouth daily. .Marland Kitchen olmesartan (BENICAR) 20 MG tablet, TAKE 2 TABLETS ('40MG'$ ) BY MOUTH DAILY  Current Outpatient Medications (Respiratory):  .  diphenhydrAMINE (BENADRYL) 25 MG tablet, Take 25 mg by mouth daily.   Current Outpatient Medications (Analgesics):  .Marland Kitchen Abatacept (ORENCIA IV), Inject 7.5 mLs into the skin every 30 (thirty) days. .Marland Kitchen acetaminophen (TYLENOL) 650 MG CR tablet, Take 650 mg by mouth 3 (three) times daily as needed for pain.  Current Outpatient Medications (Hematological):  .  cyanocobalamin (,VITAMIN B-12,) 1000 MCG/ML injection, Inject 1 mL (1,000 mcg total) into the skin every 30 (thirty) days. .  folic acid (FOLVITE) 1 MG tablet, Take 1 mg by mouth daily.  Current Outpatient Medications (Other):  .  escitalopram (LEXAPRO) 20 MG tablet, TAKE 1 TABLET BY MOUTH EVERY DAY .  meclizine (ANTIVERT) 25 MG tablet, Take 1 tablet (25 mg total) by mouth 3 (three) times daily as needed for dizziness. .  Menthol, Topical Analgesic, (BIOFREEZE ROLL-ON EX), Apply topically daily as needed. .  methotrexate 50 MG/2ML injection, Inject 2 mLs into the muscle once a week.  .  pantoprazole (PROTONIX) 20 MG tablet, Take 1 tablet Daily for Indigestion & Heartburn (Patient taking differently: Take 20 mg by mouth daily as needed for heartburn or indigestion. Take 1 tablet Daily for Indigestion & Heartburn) .  triamcinolone cream (KENALOG) 0.5 %, Apply 1 application topically 2 (two) times daily. .  Vitamin D, Ergocalciferol, (DRISDOL) 1.25 MG (50000  UNIT) CAPS capsule, TAKE 1 CAPSULE BY MOUTH 3 DAYS A WEEK FOR VITAMIN DEFICIENCY .  VOLTAREN 1 % GEL, Apply 2 g topically 4 (four) times daily as needed (joint pain).  Medical History:  Past Medical History:  Diagnosis Date  . Allergy   . Anemia   . Arthritis    "knees, hands, ankles, ~ every joint" (04/08/2015)  . Basal cell carcinoma    "face, hands, chest" (04/08/2015)  . Chronic bronchitis ([place]    "get it pretty much q yr" (04/08/2015)  . Dysrhythmia    palpitations occ; SVT 09/2013 s/p adenosine  . GERD (gastroesophageal reflux disease)   . H/O hiatal hernia   . Hx of cardiovascular stress test    Lexiscan Myoview 6/16: Ejection fraction is 60% and wall motion is normal. The study is normal. There is no scar or ischemia. This is a low risk scan.  . Hyperlipidemia   . Hypertension   .  Hypothyroidism   . Palpitations   . Pneumonia 2015 X 1  . PONV (postoperative nausea and vomiting)   . Pre-diabetes   . S/P total knee arthroplasty 02/15/2014  . Sleep apnea   . Thyroid disease   . Vitamin D deficiency     Allergies Allergies  Allergen Reactions  . Darvon [Propoxyphene Hcl] Anaphylaxis and Other (See Comments)    Airway swelling   . Propoxyphene Anaphylaxis    Throat closes up  . Doxycycline Nausea Only  . Tamiflu [Oseltamivir Phosphate] Hives and Other (See Comments)    Increased blood pressure   Surgical History: reviewed and unchanged Family History: reviewed and unchanged Social History: reviewed and unchanged  Review of Systems  Constitutional: Negative for chills, diaphoresis, fever, malaise/fatigue and weight loss.  HENT: Negative.   Eyes: Negative.   Respiratory: Negative for cough, shortness of breath and wheezing.   Cardiovascular: Negative for chest pain, palpitations, orthopnea, claudication, leg swelling and PND.  Gastrointestinal: Negative.   Genitourinary: Negative.   Musculoskeletal: Positive for back pain. Negative for falls, joint pain, myalgias  and neck pain.  Skin: Negative.   Neurological: Negative for dizziness, tingling, tremors, sensory change, speech change, focal weakness, seizures, loss of consciousness, weakness and headaches.  Psychiatric/Behavioral: Negative for depression, hallucinations, memory loss, substance abuse and suicidal ideas. The patient is not nervous/anxious and does not have insomnia.     Physical Exam: There were no vitals taken for this visit. Wt Readings from Last 3 Encounters:  10/22/19 225 lb (102.1 kg)  10/06/19 217 lb (98.4 kg)  09/01/19 242 lb 9.6 oz (110 kg)   General Appearance: Well nourished, in no apparent distress. Eyes: PERRLA, EOMs, conjunctiva no swelling or erythema Sinuses: No Frontal/maxillary tenderness ENT/Mouth: Ext aud canals clear, TMs without erythema, bulging. No erythema, swelling, or exudate on post pharynx.  Tonsils not swollen or erythematous. Hearing normal.  Neck: Supple, thyroid normal.  Respiratory: Respiratory effort normal, BS equal bilaterally with wheezing left lower lung rhonchi, or stridor.  Cardio: RRR with no MRGs. Brisk peripheral pulses with 2+ edema.  Abdomen: Soft, + BS, obese Non tender, no guarding, rebound, hernias, masses. Lymphatics: Non tender without lymphadenopathy.  Musculoskeletal: Full ROM, 5/5 strength, antalgic gait Skin: Warm, dry without rashes, lesions, ecchymosis.  Neuro: Cranial nerves intact. Normal muscle tone, no cerebellar symptoms. Sensation intact.  Psych: Awake and oriented X 3, normal affect, Insight and Judgment appropriate.    Vicie Mutters, PA-C 8:30 AM [facility]

## 2019-12-02 ENCOUNTER — Ambulatory Visit: Payer: 59 | Admitting: Physician Assistant

## 2019-12-02 DIAGNOSIS — E079 Disorder of thyroid, unspecified: Secondary | ICD-10-CM

## 2019-12-02 DIAGNOSIS — E559 Vitamin D deficiency, unspecified: Secondary | ICD-10-CM

## 2019-12-02 DIAGNOSIS — E782 Mixed hyperlipidemia: Secondary | ICD-10-CM

## 2019-12-02 DIAGNOSIS — M069 Rheumatoid arthritis, unspecified: Secondary | ICD-10-CM

## 2019-12-02 DIAGNOSIS — I471 Supraventricular tachycardia: Secondary | ICD-10-CM

## 2019-12-02 DIAGNOSIS — J449 Chronic obstructive pulmonary disease, unspecified: Secondary | ICD-10-CM

## 2019-12-02 DIAGNOSIS — I700 Atherosclerosis of aorta: Secondary | ICD-10-CM

## 2019-12-02 DIAGNOSIS — I10 Essential (primary) hypertension: Secondary | ICD-10-CM

## 2019-12-10 ENCOUNTER — Encounter: Payer: Self-pay | Admitting: Physician Assistant

## 2019-12-10 ENCOUNTER — Ambulatory Visit: Payer: 59 | Admitting: Physician Assistant

## 2019-12-10 ENCOUNTER — Other Ambulatory Visit: Payer: Self-pay

## 2019-12-10 VITALS — BP 118/76 | HR 63 | Temp 97.3°F | Wt 223.0 lb

## 2019-12-10 DIAGNOSIS — M069 Rheumatoid arthritis, unspecified: Secondary | ICD-10-CM | POA: Diagnosis not present

## 2019-12-10 DIAGNOSIS — E079 Disorder of thyroid, unspecified: Secondary | ICD-10-CM

## 2019-12-10 DIAGNOSIS — J441 Chronic obstructive pulmonary disease with (acute) exacerbation: Secondary | ICD-10-CM

## 2019-12-10 DIAGNOSIS — I471 Supraventricular tachycardia, unspecified: Secondary | ICD-10-CM

## 2019-12-10 DIAGNOSIS — I10 Essential (primary) hypertension: Secondary | ICD-10-CM

## 2019-12-10 DIAGNOSIS — E538 Deficiency of other specified B group vitamins: Secondary | ICD-10-CM

## 2019-12-10 DIAGNOSIS — J029 Acute pharyngitis, unspecified: Secondary | ICD-10-CM | POA: Diagnosis not present

## 2019-12-10 DIAGNOSIS — Z79899 Other long term (current) drug therapy: Secondary | ICD-10-CM

## 2019-12-10 DIAGNOSIS — I700 Atherosclerosis of aorta: Secondary | ICD-10-CM | POA: Diagnosis not present

## 2019-12-10 DIAGNOSIS — Z9884 Bariatric surgery status: Secondary | ICD-10-CM

## 2019-12-10 DIAGNOSIS — J449 Chronic obstructive pulmonary disease, unspecified: Secondary | ICD-10-CM

## 2019-12-10 DIAGNOSIS — E559 Vitamin D deficiency, unspecified: Secondary | ICD-10-CM

## 2019-12-10 DIAGNOSIS — E782 Mixed hyperlipidemia: Secondary | ICD-10-CM

## 2019-12-10 DIAGNOSIS — R7309 Other abnormal glucose: Secondary | ICD-10-CM

## 2019-12-10 LAB — POCT RAPID STREP A (OFFICE): Rapid Strep A Screen: NEGATIVE

## 2019-12-10 MED ORDER — AMOXICILLIN-POT CLAVULANATE 875-125 MG PO TABS: 1.0000 | ORAL_TABLET | Freq: Two times a day (BID) | ORAL | 0 refills | Status: DC

## 2019-12-10 MED ORDER — CYANOCOBALAMIN 1000 MCG/ML IJ SOLN: INTRAMUSCULAR | 0 refills | Status: DC

## 2019-12-10 MED ORDER — PREDNISONE 20 MG PO TABS: ORAL_TABLET | ORAL | 0 refills | Status: DC

## 2019-12-10 MED ORDER — ESCITALOPRAM OXALATE 20 MG PO TABS: 20.0000 mg | ORAL_TABLET | Freq: Every day | ORAL | 1 refills | Status: DC

## 2019-12-10 NOTE — Patient Instructions (Signed)
What Causes Tonsil Stones? Your tonsils are filled with nooks and crannies where bacteria and other materials, including dead cells and mucous, can become trapped. When this happens, the debris can become concentrated in white formations that occur in the pockets. Tonsil stones, or tonsilloliths, are formed when this trapped debris hardens, or calcifies. This tends to happen most often in people who have chronic inflammation in their tonsils or repeated bouts of tonsillitis. While many people have small tonsilloliths that develop in their tonsils, it is quite rare to have a large and solidified tonsil stone.  How Are Tonsil Stones Treated? The appropriate treatment for a tonsil stone depends on the size of the tonsillolith and its potential to cause discomfort or harm. Options include: No treatment. Many tonsil stones, especially ones that have no symptoms, require no special treatment. At-home removal. Some people choose to dislodge tonsil stones at home with the use of picks or swabs. Salt water gargles. Gargling with warm, salty water may help ease the discomfort of tonsillitis, which often accompanies tonsil stones. Allergy pills- Taking allergy pill every day can decrease the inflammation and decrease the formation of tonsil stones Continue reading below...  Antibiotics. Various antibiotics can be used to treat tonsil stones. While they may be helpful for some people, they cannot correct the basic problem that is causing tonsilloliths. Also, antibiotics can have side effects. Surgical removal. When tonsil stones are exceedingly large and symptomatic, it may be necessary for a surgeon to remove them. In certain instances, a doctor will be able to perform this relatively simple procedure using a local numbing agent. Then the patient will not need general anesthesia.  Can Tonsil Stones Be Prevented? Take an antihistamine daily can prevent inflammation and formation.  Since tonsil stones are more  common in people who have chronic tonsillitis, the only surefire way to prevent them is with surgical removal of the tonsils. This procedure, known as a tonsillectomy, removes the tissues of the tonsils entirely, thereby eliminating the possibility of tonsillolith formation, however, adults are more likely to have complications from the surgery than children so it is considered a last resort.   HOW TO TREAT VIRAL COUGH AND COLD SYMPTOMS:  -Symptoms usually last at least 1 week with the worst symptoms being around day 4.  - colds usually start with a sore throat and end with a cough, and the cough can take 2 weeks to get better.  -No antibiotics are needed for colds, flu, sore throats, cough, bronchitis UNLESS symptoms are longer than 7 days OR if you are getting better then get drastically worse.  -There are a lot of combination medications (Dayquil, Nyquil, Vicks 44, tyelnol cold and sinus, ETC). Please look at the ingredients on the back so that you are treating the correct symptoms and not doubling up on medications/ingredients.    Medicines you can use  Nasal congestion  Little Remedies saline spray (aerosol/mist)- can try this, it is in the kids section - pseudoephedrine (Sudafed)- behind the counter, do not use if you have high blood pressure, medicine that have -D in them.  - phenylephrine (Sudafed PE) -Dextormethorphan + chlorpheniramine (Coridcidin HBP)- okay if you have high blood pressure -Oxymetazoline (Afrin) nasal spray- LIMIT to 3 days -Saline nasal spray -Neti pot (used distilled or bottled water)  Ear pain/congestion  -pseudoephedrine (sudafed) - Nasonex/flonase nasal spray  Fever  -Acetaminophen (Tyelnol) -Ibuprofen (Advil, motrin, aleve)  Sore Throat  -Acetaminophen (Tyelnol) -Ibuprofen (Advil, motrin, aleve) -Drink a lot of water -Gargle  with salt water - Rest your voice (don't talk) -Throat sprays -Cough drops  Body Aches  -Acetaminophen  (Tyelnol) -Ibuprofen (Advil, motrin, aleve)  Headache  -Acetaminophen (Tyelnol) -Ibuprofen (Advil, motrin, aleve) - Exedrin, Exedrin Migraine  Allergy symptoms (cough, sneeze, runny nose, itchy eyes) -Claritin or loratadine cheapest but likely the weakest  -Zyrtec or certizine at night because it can make you sleepy -The strongest is allegra or fexafinadine  Cheapest at [employer], [employer], [employer]  Cough  -Dextromethorphan (Delsym)- medicine that has DM in it -Guafenesin (Mucinex/Robitussin) - cough drops - drink lots of water  Chest Congestion  -Guafenesin (Mucinex/Robitussin)  Red Itchy Eyes  - Naphcon-A  Upset Stomach  - Bland diet (nothing spicy, greasy, fried, and high acid foods like tomatoes, oranges, berries) -OKAY- cereal, bread, soup, crackers, rice -Eat smaller more frequent meals -reduce caffeine, no alcohol -Loperamide (Imodium-AD) if diarrhea -Prevacid for heart burn  General health when sick  -Hydration -wash your hands frequently -keep surfaces clean -change pillow cases and sheets often -Get fresh air but do not exercise strenuously -Vitamin D, double up on it - Vitamin C -Zinc     Self-care:   Rinse your sinuses. Use a sinus rinse device to rinse your nasal passages with a saline (salt water) solution or distilled water. Do not use tap water. This will help thin the mucus in your nose and rinse away pollen and dirt. It will also help reduce swelling so you can breathe normally. Ask your healthcare provider how often to do this.    Breathe in steam. Heat a bowl of water until you see steam. Lean over the bowl and make a tent over your head with a large towel. Breathe deeply for about 20 minutes. Be careful not to get too close to the steam or burn yourself. Do this 3 times a day. You can also breathe deeply when you take a hot shower.    Sleep with your head elevated. Place an extra pillow under your head before you go to sleep to help your sinuses  drain.    Drink liquids as directed. Ask your healthcare provider how much liquid to drink each day and which liquids are best for you. Liquids will thin the mucus in your nose and help it drain. Avoid drinks that contain alcohol or caffeine.    Do not smoke, and avoid secondhand smoke. Nicotine and other chemicals in cigarettes and cigars can make your symptoms worse. Ask your healthcare provider for information if you currently smoke and need help to quit. E-cigarettes or smokeless tobacco still contain nicotine. Talk to your healthcare provider before you use these products.  Prevent the spread of germs that cause sinusitis: Wash your hands often with soap and water. Wash your hands after you use the bathroom, change a child's diaper, or sneeze. Wash your hands before you prepare or eat food.   Follow up with your healthcare provider as directed: You may be referred to an ear, nose, and throat specialist. Write down your questions so you remember to ask them during your visits.    General eating tips  What to Avoid . Avoid added sugars o Often added sugar can be found in processed foods such as many condiments, dry cereals, cakes, cookies, chips, crisps, crackers, candies, sweetened drinks, etc.  o Read labels and AVOID/DECREASE use of foods with the following in their ingredient list: Sugar, fructose, high fructose corn syrup, sucrose, glucose, maltose, dextrose, molasses, cane sugar, brown sugar, any type of syrup,  agave nectar, etc.   . Avoid snacking in between meals- drink water or if you feel you need a snack, pick a high water content snack such as cucumbers, watermelon, or any veggie.  Marland Kitchen Avoid foods made with flour o If you are going to eat food made with flour, choose those made with whole-grains; and, minimize your consumption as much as is tolerable . Avoid processed foods o These foods are generally stocked in the middle of the grocery store.  o Focus on shopping on the  perimeter of the grocery.  What to Include . Vegetables o GREEN LEAFY VEGETABLES: Kale, spinach, mustard greens, collard greens, cabbage, broccoli, etc. o OTHER: Asparagus, cauliflower, eggplant, carrots, peas, Brussel sprouts, tomatoes, bell peppers, zucchini, beets, cucumbers, etc. . Grains, seeds, and legumes o Beans: kidney beans, black eyed peas, garbanzo beans, black beans, pinto beans, etc. o Whole, unrefined grains: brown rice, barley, bulgur, oatmeal, etc. . Healthy fats  o Avoid highly processed fats such as vegetable oil o Examples of healthy fats: avocado, olives, virgin olive oil, dark chocolate (?72% Cocoa), nuts (peanuts, almonds, walnuts, cashews, pecans, etc.) o Please still do small amount of these healthy fats, they are dense in calories.  . Low - Moderate Intake of Animal Sources of Protein o Meat sources: chicken, [place], salmon, tuna. Limit to 4 ounces of meat at one time or the size of your palm. o Consider limiting dairy sources, but when choosing dairy focus on: PLAIN [place] yogurt, cottage cheese, high-protein milk . Fruit o Choose berries

## 2019-12-10 NOTE — Progress Notes (Signed)
FOLLOW UP  Assessment and Plan:   Aortic atherosclerosis (HCC) Control blood pressure, cholesterol, glucose, increase exercise.   PSVT (paroxysmal supraventricular tachycardia) (HCC) Continue cardio follow up  SVT (supraventricular tachycardia) (HCC) Continue cardio follow up  COPD GOLD 0/ former smoker at risk No triggers, well controlled symptoms, cont to monitor  Nocturnal hypoxemia due to emphysema (HCC) Monitor  Rheumatoid arthritis, involving unspecified site, unspecified whether rheumatoid factor present ([place]) Continue follow up   Morbid obesity ([place]) - follow up 3 months for progress monitoring - increase veggies, decrease carbs - long discussion about weight loss, diet, and exercise - reach water goal, add in exercise.   Mixed hyperlipidemia check lipids decrease fatty foods increase activity.   Thyroid disease Hypothyroidism-check TSH level, continue medications the same, reminded to take on an empty stomach 30-9mns before food.  -     TSH  Vitamin D deficiency Continue supplement  Essential hypertension -     Urinalysis, Routine w reflex microscopic -     Microalbumin / creatinine urine ratio - continue medications, DASH diet, exercise and monitor at home. Call if greater than 130/80.   Continue diet and meds as discussed. Further disposition pending results of labs. Future Appointments  Date Time Provider [place] 08/31/2020  3:00 PM CVicie Mutters PA-C [name] None    HPI 59y.o. female  presents for 3 month follow up with hypertension, hyperlipidemia, prediabetes and vitamin D.  X 1 week has had sore throat, sinus congestion, very tired, her grand kids did test + for strep. No fever, no chills. She does have COPD and has a cough.   Her blood pressure has been controlled at home, her BP is doing better with ziac '5mg'$ , Benicar '20mg'$  and lasix pill BUT she forgot her medications today, today their BP is BP: 118/76   Patient has  history of ablation for SVT with Dr. NLajuana Ripplein 2016.  She has a history of RA.  Following with Dr. BAmil Amen   She had the gastric sleeve Oct 13th, highest weight was 273. She has gone back to smoking, a few everyday. States her knees are better.  Due to complications she had a splenectomy BMI is Body mass index is 36.54 kg/m., she is working on diet and exercise. She has not met her fluid intake, she has met her protein intake.  Wt Readings from Last 3 Encounters:  12/10/19 223 lb (101.2 kg)  10/22/19 225 lb (102.1 kg)  10/06/19 217 lb (98.4 kg)   She is on '20mg'$  of lexapro and she is doing better. She does not workout.  She denies chest pain, shortness of breath, dizziness.  She is not on cholesterol medication, family history of MI, GM at 757 mom at 735and denies myalgias. Had recent Stress test 03/2019, normal Echo, did show LVH.  Her cholesterol is not at goal. The cholesterol last visit was:   Lab Results  Component Value Date   CHOL 187 08/13/2019   HDL 38 (L) 08/13/2019   LDLCALC 117 (H) 08/13/2019   TRIG 199 (H) 08/13/2019   CHOLHDL 4.9 08/13/2019   She has been working on diet and exercise for prediabetes, and denies paresthesia of the feet, polydipsia and polyuria. Last A1C in the office was:  Lab Results  Component Value Date   HGBA1C 5.8 (H) 08/13/2019   Patient is on Vitamin D supplement, she is on 50,000, 3 days a week- not taking well.    Lab Results  Component Value  Date   VD25OH 55 08/13/2019     She is on thyroid medication. Her medication is on 162mg daily with water 1 hour before food. Lab Results  Component Value Date   TSH 29.99 (H) 09/01/2019  .  She has a B12 def, on B12 injections she never did the loading dose, she has taken in Dec and Jan and still feels some fatigue   Lab Results  Component Value Date   VITAMINB12 330 09/01/2019    Current Medications:   Current Outpatient Medications (Endocrine & Metabolic):  .  levothyroxine  (SYNTHROID) 137 MCG tablet, Take 1 tablet daily on an empty stomach with only water for 30 minutes & no Antacid meds, Calcium or Magnesium for 4 hours & avoid Biotin  Current Outpatient Medications (Cardiovascular):  .  bisoprolol-hydrochlorothiazide (ZIAC) 5-6.25 MG tablet, TAKE 1 TABLET DAILY FOR BLOOD PRESSURE .  furosemide (LASIX) 40 MG tablet, Take 1 tablet (40 mg total) by mouth daily. .Marland Kitchen olmesartan (BENICAR) 20 MG tablet, TAKE 2 TABLETS ('40MG'$ ) BY MOUTH DAILY  Current Outpatient Medications (Respiratory):  .  diphenhydrAMINE (BENADRYL) 25 MG tablet, Take 25 mg by mouth daily.   Current Outpatient Medications (Analgesics):  .Marland Kitchen Abatacept (ORENCIA IV), Inject 7.5 mLs into the skin every 30 (thirty) days. .Marland Kitchen acetaminophen (TYLENOL) 650 MG CR tablet, Take 650 mg by mouth 3 (three) times daily as needed for pain.  Current Outpatient Medications (Hematological):  .  cyanocobalamin (,VITAMIN B-12,) 1000 MCG/ML injection, Inject 1 mL (1,000 mcg total) into the skin every 30 (thirty) days. .  folic acid (FOLVITE) 1 MG tablet, Take 1 mg by mouth daily.  Current Outpatient Medications (Other):  .  escitalopram (LEXAPRO) 20 MG tablet, TAKE 1 TABLET BY MOUTH EVERY DAY .  meclizine (ANTIVERT) 25 MG tablet, Take 1 tablet (25 mg total) by mouth 3 (three) times daily as needed for dizziness. .  Menthol, Topical Analgesic, (BIOFREEZE ROLL-ON EX), Apply topically daily as needed. .  methotrexate 50 MG/2ML injection, Inject 2 mLs into the muscle once a week.  .  pantoprazole (PROTONIX) 20 MG tablet, Take 1 tablet Daily for Indigestion & Heartburn (Patient taking differently: Take 20 mg by mouth daily as needed for heartburn or indigestion. Take 1 tablet Daily for Indigestion & Heartburn) .  triamcinolone cream (KENALOG) 0.5 %, Apply 1 application topically 2 (two) times daily. .  Vitamin D, Ergocalciferol, (DRISDOL) 1.25 MG (50000 UNIT) CAPS capsule, TAKE 1 CAPSULE BY MOUTH 3 DAYS A WEEK FOR VITAMIN  DEFICIENCY .  VOLTAREN 1 % GEL, Apply 2 g topically 4 (four) times daily as needed (joint pain).  Medical History:  Past Medical History:  Diagnosis Date  . Allergy   . Anemia   . Arthritis    "knees, hands, ankles, ~ every joint" (04/08/2015)  . Basal cell carcinoma    "face, hands, chest" (04/08/2015)  . [place]itis [place]    "get it pretty much q yr" (04/08/2015)  . Dysrhythmia    palpitations occ; SVT 09/2013 s/p adenosine  . GERD (gastroesophageal reflux disease)   . H/O hiatal hernia   . Hx of cardiovascular stress test    Lexiscan Myoview 6/16: Ejection fraction is 60% and wall motion is normal. The study is normal. There is no scar or ischemia. This is a low risk scan.  . Hyperlipidemia   . Hypertension   . Hypothyroidism   . Palpitations   . Pneumonia 2015 X 1  . PONV (postoperative nausea and  vomiting)   . Pre-diabetes   . S/P total knee arthroplasty 02/15/2014  . Sleep apnea   . Thyroid disease   . Vitamin D deficiency     Allergies Allergies  Allergen Reactions  . Darvon [Propoxyphene Hcl] Anaphylaxis and Other (See Comments)    Airway swelling   . Propoxyphene Anaphylaxis    Throat closes up  . Doxycycline Nausea Only  . Tamiflu [Oseltamivir Phosphate] Hives and Other (See Comments)    Increased blood pressure   Surgical History: reviewed and unchanged Family History: reviewed and unchanged Social History: reviewed and unchanged  Review of Systems  Constitutional: Negative for chills, diaphoresis, fever, malaise/fatigue and weight loss.  HENT: Negative.   Eyes: Negative.   Respiratory: Negative for cough, shortness of breath and wheezing.   Cardiovascular: Negative for chest pain, palpitations, orthopnea, claudication, leg swelling and PND.  Gastrointestinal: Negative.   Genitourinary: Negative.   Musculoskeletal: Positive for back pain. Negative for falls, joint pain, myalgias and neck pain.  Skin: Negative.   Neurological: Negative for  dizziness, tingling, tremors, sensory change, speech change, focal weakness, seizures, loss of consciousness, weakness and headaches.  Psychiatric/Behavioral: Negative for depression, hallucinations, memory loss, substance abuse and suicidal ideas. The patient is not nervous/anxious and does not have insomnia.     Physical Exam: BP 118/76   Pulse 63   Temp (!) 97.3 F (36.3 C)   Wt 223 lb (101.2 kg)   SpO2 97%   BMI 36.54 kg/m  Wt Readings from Last 3 Encounters:  12/10/19 223 lb (101.2 kg)  10/22/19 225 lb (102.1 kg)  10/06/19 217 lb (98.4 kg)   General Appearance: Well nourished, in no apparent distress. Eyes: PERRLA, EOMs, conjunctiva no swelling or erythema Sinuses: No Frontal/maxillary tenderness ENT/Mouth: Ext aud canals clear, TMs without erythema, bulging. No erythema, swelling, or exudate on post pharynx.  Tonsils not swollen or erythematous. Hearing normal.  Neck: Supple, thyroid normal.  Respiratory: Respiratory effort normal, BS equal bilaterally with wheezing left lower lung rhonchi, or stridor.  Cardio: RRR with no MRGs. Brisk peripheral pulses with 2+ edema.  Abdomen: Soft, + BS, obese Non tender, no guarding, rebound, hernias, masses. Lymphatics: Non tender without lymphadenopathy.  Musculoskeletal: Full ROM, 5/5 strength, antalgic gait Skin: Warm, dry without rashes, lesions, ecchymosis.  Neuro: Cranial nerves intact. Normal muscle tone, no cerebellar symptoms. Sensation intact.  Psych: Awake and oriented X 3, normal affect, Insight and Judgment appropriate.    Vicie Mutters, PA-C 3:52 PM [facility]

## 2019-12-11 LAB — COMPLETE METABOLIC PANEL WITH GFR
AG Ratio: 1.8 (calc) (ref 1.0–2.5)
ALT: 11 U/L (ref 6–29)
AST: 12 U/L (ref 10–35)
Albumin: 4.2 g/dL (ref 3.6–5.1)
Alkaline phosphatase (APISO): 79 U/L (ref 37–153)
BUN: 18 mg/dL (ref 7–25)
CO2: 26 mmol/L (ref 20–32)
Calcium: 9.9 mg/dL (ref 8.6–10.4)
Chloride: 105 mmol/L (ref 98–110)
Creat: 0.85 mg/dL (ref 0.50–1.05)
GFR, Est African American: 88 mL/min/{1.73_m2} (ref >=60)
GFR, Est Non African American: 76 mL/min/{1.73_m2} (ref >=60)
Globulin: 2.4 g/dL (calc) (ref 1.9–3.7)
Glucose, Bld: 99 mg/dL (ref 65–99)
Potassium: 4.3 mmol/L (ref 3.5–5.3)
Sodium: 139 mmol/L (ref 135–146)
Total Bilirubin: 0.2 mg/dL (ref 0.2–1.2)
Total Protein: 6.6 g/dL (ref 6.1–8.1)

## 2019-12-11 LAB — CBC WITH DIFFERENTIAL/PLATELET
Absolute Monocytes: 873 cells/uL (ref 200–950)
Basophils Absolute: 37 cells/uL (ref 0–200)
Basophils Relative: 0.5 %
Eosinophils Absolute: 59 cells/uL (ref 15–500)
Eosinophils Relative: 0.8 %
HCT: 33.7 % — ABNORMAL LOW (ref 35.0–45.0)
Hemoglobin: 11.2 g/dL — ABNORMAL LOW (ref 11.7–15.5)
Lymphs Abs: 2464 cells/uL (ref 850–3900)
MCH: 33.6 pg — ABNORMAL HIGH (ref 27.0–33.0)
MCHC: 33.2 g/dL (ref 32.0–36.0)
MCV: 101.2 fL — ABNORMAL HIGH (ref 80.0–100.0)
MPV: 11.6 fL (ref 7.5–12.5)
Monocytes Relative: 11.8 %
Neutro Abs: 3966 cells/uL (ref 1500–7800)
Neutrophils Relative %: 53.6 %
Platelets: 301 10*3/uL (ref 140–400)
RBC: 3.33 10*6/uL — ABNORMAL LOW (ref 3.80–5.10)
RDW: 14.1 % (ref 11.0–15.0)
Total Lymphocyte: 33.3 %
WBC: 7.4 10*3/uL (ref 3.8–10.8)

## 2019-12-11 LAB — LIPID PANEL
Cholesterol: 169 mg/dL (ref <200)
HDL: 42 mg/dL — ABNORMAL LOW (ref >=50)
LDL Cholesterol (Calc): 104 mg/dL (calc) — ABNORMAL HIGH
Non-HDL Cholesterol (Calc): 127 mg/dL (calc) (ref <130)
Total CHOL/HDL Ratio: 4 (calc) (ref <5.0)
Triglycerides: 124 mg/dL (ref <150)

## 2019-12-11 LAB — HEMOGLOBIN A1C
Hgb A1c MFr Bld: 5.7 % of total Hgb — ABNORMAL HIGH (ref <5.7)
Mean Plasma Glucose: 117 (calc)
eAG (mmol/L): 6.5 (calc)

## 2019-12-11 LAB — TSH: TSH: 0.27 mIU/L — ABNORMAL LOW (ref 0.40–4.50)

## 2019-12-11 LAB — VITAMIN D 25 HYDROXY (VIT D DEFICIENCY, FRACTURES): Vit D, 25-Hydroxy: >150 ng/mL — ABNORMAL HIGH (ref 30–100)

## 2019-12-11 LAB — VITAMIN B12: Vitamin B-12: 426 pg/mL (ref 200–1100)

## 2019-12-11 LAB — MAGNESIUM: Magnesium: 2 mg/dL (ref 1.5–2.5)

## 2020-01-22 ENCOUNTER — Ambulatory Visit: Payer: 59 | Admitting: Physician Assistant

## 2020-01-22 ENCOUNTER — Other Ambulatory Visit: Payer: Self-pay

## 2020-01-22 ENCOUNTER — Encounter: Payer: Self-pay | Admitting: Physician Assistant

## 2020-01-22 VITALS — BP 130/70 | HR 76 | Temp 97.6°F | Wt 216.0 lb

## 2020-01-22 DIAGNOSIS — J441 Chronic obstructive pulmonary disease with (acute) exacerbation: Secondary | ICD-10-CM | POA: Diagnosis not present

## 2020-01-22 MED ORDER — PROMETHAZINE-DM 6.25-15 MG/5ML PO SYRP: 5.0000 mL | ORAL_SOLUTION | Freq: Four times a day (QID) | ORAL | 1 refills | Status: DC | PRN

## 2020-01-22 MED ORDER — DEXAMETHASONE 0.5 MG PO TABS: ORAL_TABLET | ORAL | 0 refills | Status: DC

## 2020-01-22 MED ORDER — AZITHROMYCIN 250 MG PO TABS: ORAL_TABLET | ORAL | 1 refills | Status: AC

## 2020-01-22 MED ORDER — BREZTRI AEROSPHERE 160-9-4.8 MCG/ACT IN AERO: 1.0000 | INHALATION_SPRAY | Freq: Two times a day (BID) | RESPIRATORY_TRACT | 1 refills | Status: AC

## 2020-01-22 MED ORDER — ALBUTEROL SULFATE HFA 108 (90 BASE) MCG/ACT IN AERS: 2.0000 | INHALATION_SPRAY | RESPIRATORY_TRACT | 2 refills | Status: DC | PRN

## 2020-01-22 NOTE — Patient Instructions (Signed)
Go to the ER if any chest pain, shortness of breath, nausea, dizziness, severe HA, changes vision/speech  INFORMATION ABOUT YOUR STEROID INHALER  Can do steroid inhaler, NEED TO DO DAILY, this is NOT a rescue inhaler so if you are acutely short of breath please use your albuterol or call 911.  Do 1 puff twice a day.  Do before you brush your teeth OR wash your mouth afterwards.  IF YOU DO NOT Aurora YOUR MOUTH OUT IT CAN CAUSE YEAST Can do 2 tsp vinegar with water and switch to help prevent yeast or help yeast in your mouth.    Chronic Obstructive Pulmonary Disease Exacerbation  Chronic obstructive pulmonary disease (COPD) is a long-term (chronic) condition that affects the lungs. COPD is a general term that can be used to describe many different lung problems that cause lung swelling (inflammation) and limit airflow, including chronic bronchitis and emphysema. COPD exacerbations are episodes when breathing symptoms become much worse and require extra treatment. COPD exacerbations are usually caused by infections. Without treatment, COPD exacerbations can be severe and even life threatening. Frequent COPD exacerbations can cause further damage to the lungs. What are the causes? This condition may be caused by:  Respiratory infections, including viral and bacterial infections.  Exposure to smoke.  Exposure to air pollution, chemical fumes, or dust.  Things that give you an allergic reaction (allergens).  Not taking your usual COPD medicines as directed.  Underlying medical problems, such as congestive heart failure or infections not involving the lungs. In many cases, the cause (trigger) of this condition is not known. What increases the risk? The following factors may make you more likely to develop this condition:  Smoking cigarettes.  Old age.  Frequent prior COPD exacerbations. What are the signs or symptoms? Symptoms of this condition include:  Increased  coughing.  Increased production of mucus from your lungs (sputum).  Increased wheezing.  Increased shortness of breath.  Rapid or labored breathing.  Chest tightness.  Less energy than usual.  Sleep disruption from symptoms.  Confusion or increased sleepiness. Often these symptoms happen or get worse even with the use of medicines. How is this diagnosed? This condition is diagnosed based on:  Your medical history.  A physical exam. You may also have tests, including:  A chest X-ray.  Blood tests.  Lung (pulmonary) function tests. How is this treated? Treatment for this condition depends on the severity and cause of the symptoms. You may need to be admitted to a hospital for treatment. Some of the treatments commonly used to treat COPD exacerbations are:  Antibiotic medicines. These may be used for severe exacerbations caused by a lung infection, such as pneumonia.  Bronchodilators. These are inhaled medicines that expand the air passages and allow increased airflow.  Steroid medicines. These act to reduce inflammation in the airways. They may be given with an inhaler, taken by mouth, or given through an IV tube inserted into one of your veins.  Supplemental oxygen therapy.  Airway clearing techniques, such as noninvasive ventilation (NIV) and positive expiratory pressure (PEP). These provide respiratory support through a mask or other noninvasive device. An example of this would be using a continuous positive airway pressure (CPAP) machine to improve delivery of oxygen into your lungs. Follow these instructions at home: Medicines  Take over-the-counter and prescription medicines only as told by your health care provider. It is important to use correct technique with inhaled medicines.  If you were prescribed an antibiotic medicine or oral  steroid, take it as told by your health care provider. Do not stop taking the medicine even if you start to feel  better. Lifestyle  Eat a healthy diet.  Exercise regularly.  Get plenty of sleep.  Avoid exposure to all substances that irritate the airway, especially to tobacco smoke.  Wash your hands often with soap and water to reduce the risk of infection. If soap and water are not available, use hand sanitizer.  During flu season, avoid enclosed spaces that are crowded with people. General instructions  Drink enough fluid to keep your urine clear or pale yellow (unless you have a medical condition that requires fluid restriction).  Use a cool mist vaporizer. This humidifies the air and makes it easier for you to clear your chest when you cough.  If you have a home nebulizer and oxygen, continue to use them as told by your health care provider.  Keep all follow-up visits as told by your health care provider. This is important. How is this prevented?  Stay up-to-date on pneumococcal and influenza (flu) vaccines. A flu shot is recommended every year to help prevent exacerbations.  Do not use any products that contain nicotine or tobacco, such as cigarettes and e-cigarettes. Quitting smoking is very important in preventing COPD from getting worse and in preventing exacerbations from happening as often. If you need help quitting, ask your health care provider.  Follow all instructions for pulmonary rehabilitation after a recent exacerbation. This can help prevent future exacerbations.  Work with your health care provider to develop and follow an action plan. This tells you what steps to take when you experience certain symptoms. Contact a health care provider if:  You have a worsening of your regular COPD symptoms. Get help right away if:  You have worsening shortness of breath, even when resting.  You have trouble talking.  You have severe chest pain.  You cough up blood.  You have a fever.  You have weakness, vomit repeatedly, or faint.  You feel confused.  You are not able to  sleep because of your symptoms.  You have trouble doing daily activities. Summary  COPD exacerbations are episodes when breathing symptoms become much worse and require extra treatment above your normal treatment.  Exacerbations can be severe and even life threatening. Frequent COPD exacerbations can cause further damage to your lungs.  COPD exacerbations are usually triggered by infections such as the flu, colds, and even pneumonia.  Treatment for this condition depends on the severity and cause of the symptoms. You may need to be admitted to a hospital for treatment.  Quitting smoking is very important to prevent COPD from getting worse and to prevent exacerbations from happening as often. This information is not intended to replace advice given to you by your health care provider. Make sure you discuss any questions you have with your health care provider. Document Revised: 07/12/2017 Document Reviewed: 09/03/2016 Elsevier Patient Education  2020 [employer].

## 2020-01-22 NOTE — Progress Notes (Signed)
Subjective:    Patient ID: Sherry Dennis, female    DOB: 04/04/1961, 59 y.o.   MRN: [id number]  HPI 59 y.o. WF with history of COPD, RA on OSA, has had cough, fatigue x Monday, worsening wednesday. She has been vaccinated for COVID. Her grandson is also sick with RSV.  She has sinus congestion, HA, cough non productive, wheezing, some SOB, no chest pain. Weight is down. No swelling bilateral legs.  No fever, no chills.  Happened quickly, she does not have any inhalers.   CXR 01/19/2019  Blood pressure 130/70, pulse 76, temperature 97.6 F (36.4 C), weight 216 lb (98 kg), SpO2 97 %.   Wt Readings from Last 3 Encounters:  01/22/20 216 lb (98 kg)  12/10/19 223 lb (101.2 kg)  10/22/19 225 lb (102.1 kg)     Medications  Current Outpatient Medications (Endocrine & Metabolic):  .  levothyroxine (SYNTHROID) 137 MCG tablet, Take 1 tablet daily on an empty stomach with only water for 30 minutes & no Antacid meds, Calcium or Magnesium for 4 hours & avoid Biotin .  predniSONE (DELTASONE) 20 MG tablet, 2 tablets daily for 3 days, 1 tablet daily for 4 days.  Current Outpatient Medications (Cardiovascular):  .  bisoprolol-hydrochlorothiazide (ZIAC) 5-6.25 MG tablet, TAKE 1 TABLET DAILY FOR BLOOD PRESSURE .  furosemide (LASIX) 40 MG tablet, Take 1 tablet (40 mg total) by mouth daily. Marland Kitchen  olmesartan (BENICAR) 20 MG tablet, TAKE 2 TABLETS (40MG ) BY MOUTH DAILY  Current Outpatient Medications (Respiratory):  .  diphenhydrAMINE (BENADRYL) 25 MG tablet, Take 25 mg by mouth daily.   Current Outpatient Medications (Analgesics):  Marland Kitchen  Abatacept (ORENCIA IV), Inject 7.5 mLs into the skin every 30 (thirty) days. Marland Kitchen  acetaminophen (TYLENOL) 650 MG CR tablet, Take 650 mg by mouth 3 (three) times daily as needed for pain.  Current Outpatient Medications (Hematological):  .  cyanocobalamin (,VITAMIN B-12,) 1000 MCG/ML injection, Inject 1058mcg once weekly for 6 weeks, then once a month .  folic acid  (FOLVITE) 1 MG tablet, Take 1 mg by mouth daily.  Current Outpatient Medications (Other):  .  escitalopram (LEXAPRO) 20 MG tablet, Take 1 tablet (20 mg total) by mouth daily. .  meclizine (ANTIVERT) 25 MG tablet, Take 1 tablet (25 mg total) by mouth 3 (three) times daily as needed for dizziness. .  Menthol, Topical Analgesic, (BIOFREEZE ROLL-ON EX), Apply topically daily as needed. .  methotrexate 50 MG/2ML injection, Inject 2 mLs into the muscle once a week.  .  pantoprazole (PROTONIX) 20 MG tablet, Take 1 tablet Daily for Indigestion & Heartburn (Patient taking differently: Take 20 mg by mouth daily as needed for heartburn or indigestion. Take 1 tablet Daily for Indigestion & Heartburn) .  triamcinolone cream (KENALOG) 0.5 %, Apply 1 application topically 2 (two) times daily. .  Vitamin D, Ergocalciferol, (DRISDOL) 1.25 MG (50000 UNIT) CAPS capsule, TAKE 1 CAPSULE BY MOUTH 3 DAYS A WEEK FOR VITAMIN DEFICIENCY .  VOLTAREN 1 % GEL, Apply 2 g topically 4 (four) times daily as needed (joint pain).  Problem list She has Hypertension; Hyperlipidemia; Thyroid disease; Vitamin D deficiency; PSVT (paroxysmal supraventricular tachycardia) ([place]); SVT (supraventricular tachycardia) ([place]); Rheumatoid arthritis ([place]); COPD GOLD 0/ former smoker at risk; External hemorrhoid, bleeding; Rectal pain; Aortic atherosclerosis ([place]); Adrenal adenoma; History of colonic polyps; Polyp of cecum; OSA on CPAP; Nocturnal hypoxemia due to emphysema ([place]); Snoring; Former smoker; Morbid obesity ([place]); S/P laparoscopic sleeve gastrectomy; and H/O splenectomy on their problem  list.   Review of Systems     Objective:   Physical Exam Constitutional:      General: She is not in acute distress.    Appearance: She is well-developed. She is obese.  HENT:     Head: Normocephalic and atraumatic.     Right Ear: External ear normal.     Nose:     Right Sinus: Maxillary sinus tenderness present. No frontal sinus tenderness.      Left Sinus: Maxillary sinus tenderness present. No frontal sinus tenderness.  Eyes:     Conjunctiva/sclera: Conjunctivae normal.  Cardiovascular:     Rate and Rhythm: Normal rate and regular rhythm.     Heart sounds: Normal heart sounds.  Pulmonary:     Effort: Pulmonary effort is normal. No respiratory distress.     Breath sounds: No stridor. Wheezing (diffuse) present. No rhonchi or rales.  Chest:     Chest wall: No tenderness.  Abdominal:     General: Bowel sounds are normal.     Palpations: Abdomen is soft.  Musculoskeletal:     Cervical back: Normal range of motion and neck supple.     Right lower leg: No edema.     Left lower leg: No edema.  Lymphadenopathy:     Cervical: Cervical adenopathy present.  Skin:    General: Skin is warm and dry.  Neurological:     Mental Status: She is alert.           Assessment & Plan:  Sherry Dennis was seen today for uri.  Diagnoses and all orders for this visit:  COPD exacerbation ([place]) -     azithromycin (ZITHROMAX) 250 MG tablet; Take 2 tablets (500 mg) on  Day 1,  followed by 1 tablet (250 mg) once daily on Days 2 through 5. -     dexamethasone (DECADRON) 0.5 MG tablet; take 1 tablet PO BID for 3 days, then take 1 tablet PO for 4 days. -     albuterol (VENTOLIN HFA) 108 (90 Base) MCG/ACT inhaler; Inhale 2 puffs into the lungs every 4 (four) hours as needed for wheezing or shortness of breath. -     promethazine-dextromethorphan (PROMETHAZINE-DM) 6.25-15 MG/5ML syrup; Take 5 mLs by mouth 4 (four) times daily as needed for cough.  Other orders -     Budeson-Glycopyrrol-Formoterol (BREZTRI AEROSPHERE) 160-9-4.8 MCG/ACT AERO; Inhale 1 Pump into the lungs in the morning and at bedtime.  Will get CXR if not better or next OV The patient was advised to call immediately if she has any concerning symptoms in the interval. The patient voices understanding of current treatment options and is in agreement with the current care plan.The patient  knows to call the clinic with any problems, questions or concerns or go to the ER if any further progression of symptoms.

## 2020-02-01 ENCOUNTER — Telehealth: Payer: Self-pay | Admitting: Physician Assistant

## 2020-02-01 DIAGNOSIS — J441 Chronic obstructive pulmonary disease with (acute) exacerbation: Secondary | ICD-10-CM

## 2020-02-01 MED ORDER — AMOXICILLIN-POT CLAVULANATE 875-125 MG PO TABS: 1.0000 | ORAL_TABLET | Freq: Two times a day (BID) | ORAL | 0 refills | Status: DC

## 2020-02-01 NOTE — Telephone Encounter (Signed)
Patient seen 06/11, for COPD exacerbation, will get CXR since not improved, sent in augmentin, if not better needs OV or Go to the ER if any chest pain, shortness of breath, nausea, dizziness, severe HA, changes vision/speech

## 2020-02-03 ENCOUNTER — Other Ambulatory Visit: Payer: Self-pay

## 2020-02-03 ENCOUNTER — Ambulatory Visit
Admission: RE | Admit: 2020-02-03 | Discharge: 2020-02-03 | Disposition: A | Discharge status: Home / Self Care | Payer: 59 | Source: Ambulatory Visit | Attending: Physician Assistant | Admitting: Physician Assistant

## 2020-02-03 DIAGNOSIS — J441 Chronic obstructive pulmonary disease with (acute) exacerbation: Secondary | ICD-10-CM

## 2020-02-03 NOTE — Telephone Encounter (Signed)
Pt aware of message

## 2020-03-14 ENCOUNTER — Ambulatory Visit: Payer: 59 | Admitting: Physician Assistant

## 2020-03-17 ENCOUNTER — Encounter (HOSPITAL_COMMUNITY): Payer: Self-pay

## 2020-03-23 NOTE — Progress Notes (Deleted)
FOLLOW UP  Assessment and Plan:   Aortic atherosclerosis (HCC) Control blood pressure, cholesterol, glucose, increase exercise.   PSVT (paroxysmal supraventricular tachycardia) (HCC) Continue cardio follow up  SVT (supraventricular tachycardia) (HCC) Continue cardio follow up  COPD GOLD 0/ former smoker at risk No triggers, well controlled symptoms, cont to monitor  Nocturnal hypoxemia due to emphysema (HCC) Monitor  Rheumatoid arthritis, involving unspecified site, unspecified whether rheumatoid factor present ([place]) Continue follow up   Morbid obesity ([place]) - follow up 3 months for progress monitoring - increase veggies, decrease carbs - long discussion about weight loss, diet, and exercise - reach water goal, add in exercise.   Mixed hyperlipidemia check lipids decrease fatty foods increase activity.   Thyroid disease Hypothyroidism-check TSH level, continue medications the same, reminded to take on an empty stomach 30-84mns before food.  -     TSH  Vitamin D deficiency Continue supplement  Essential hypertension -     Urinalysis, Routine w reflex microscopic -     Microalbumin / creatinine urine ratio - continue medications, DASH diet, exercise and monitor at home. Call if greater than 130/80.   Continue diet and meds as discussed. Further disposition pending results of labs. Future Appointments  Date Time Provider DHope 03/24/2020  9:30 AM CVicie Mutters PA-C [name] None  08/31/2020  3:00 PM CVicie Mutters PA-C [name] None    HPI 59y.o. female  presents for 3 month follow up with hypertension, hyperlipidemia, prediabetes and vitamin D.  X 1 week has had sore throat, sinus congestion, very tired, her grand kids did test + for strep. No fever, no chills. She does have COPD and has a cough.   Her blood pressure has been controlled at home, her BP is doing better with ziac 541m Benicar 2030mnd lasix pill BUT she forgot her medications  today, today their BP is     Patient has history of ablation for SVT with Dr. NisLajuana Ripple 2016.  She has a history of RA.  Following with Dr. BeeAmil Amen She had the gastric sleeve Oct 13th, highest weight was 273. She has gone back to smoking, a few everyday. States her knees are better.  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 She has not met her fluid intake, she has met her protein intake.  Wt Readings from Last 3 Encounters:  01/22/20 216 lb (98 kg)  12/10/19 223 lb (101.2 kg)  10/22/19 225 lb (102.1 kg)   She is on 22m33m lexapro and she is doing better. She does not workout.  She denies chest pain, shortness of breath, dizziness.  She is not on cholesterol medication, family history of MI, GM at 74, 15m at 79 a72 denies myalgias. Had recent Stress test 03/2019, normal Echo, did show LVH.  Her cholesterol is not at goal. The cholesterol last visit was:   Lab Results  Component Value Date   CHOL 169 12/10/2019   HDL 42 (L) 12/10/2019   LDLCALC 104 (H) 12/10/2019   TRIG 124 12/10/2019   CHOLHDL 4.0 12/10/2019   She has been working on diet and exercise for prediabetes, and denies paresthesia of the feet, polydipsia and polyuria. Last A1C in the office was:  Lab Results  Component Value Date   HGBA1C 5.7 (H) 12/10/2019   Patient is on Vitamin D supplement, she is on 50,000, 3  days a week- not taking well.    Lab Results  Component Value Date   VD25OH >150 (H) 12/10/2019     She is on thyroid medication. Her medication is on 155mg daily with water 1 hour before food. Lab Results  Component Value Date   TSH 0.27 (L) 12/10/2019  .  She has a B12 def, on B12 injections she never did the loading dose, she has taken in Dec and Jan and still feels some fatigue   Lab Results  Component Value Date   VITAMINB12 426 12/10/2019    Current Medications:   Current Outpatient  Medications (Endocrine & Metabolic):  .  dexamethasone (DECADRON) 0.5 MG tablet, take 1 tablet PO BID for 3 days, then take 1 tablet PO for 4 days. .Marland Kitchen levothyroxine (SYNTHROID) 137 MCG tablet, Take 1 tablet daily on an empty stomach with only water for 30 minutes & no Antacid meds, Calcium or Magnesium for 4 hours & avoid Biotin .  predniSONE (DELTASONE) 20 MG tablet, 2 tablets daily for 3 days, 1 tablet daily for 4 days.  Current Outpatient Medications (Cardiovascular):  .  bisoprolol-hydrochlorothiazide (ZIAC) 5-6.25 MG tablet, TAKE 1 TABLET DAILY FOR BLOOD PRESSURE .  furosemide (LASIX) 40 MG tablet, Take 1 tablet (40 mg total) by mouth daily. .Marland Kitchen olmesartan (BENICAR) 20 MG tablet, TAKE 2 TABLETS (40MG) BY MOUTH DAILY  Current Outpatient Medications (Respiratory):  .  albuterol (VENTOLIN HFA) 108 (90 Base) MCG/ACT inhaler, Inhale 2 puffs into the lungs every 4 (four) hours as needed for wheezing or shortness of breath. .  Budeson-Glycopyrrol-Formoterol (BREZTRI AEROSPHERE) 160-9-4.8 MCG/ACT AERO, Inhale 1 Pump into the lungs in the morning and at bedtime. .  diphenhydrAMINE (BENADRYL) 25 MG tablet, Take 25 mg by mouth daily.  .  promethazine-dextromethorphan (PROMETHAZINE-DM) 6.25-15 MG/5ML syrup, Take 5 mLs by mouth 4 (four) times daily as needed for cough.  Current Outpatient Medications (Analgesics):  .Marland Kitchen Abatacept (ORENCIA IV), Inject 7.5 mLs into the skin every 30 (thirty) days. .Marland Kitchen acetaminophen (TYLENOL) 650 MG CR tablet, Take 650 mg by mouth 3 (three) times daily as needed for pain.  Current Outpatient Medications (Hematological):  .  cyanocobalamin (,VITAMIN B-12,) 1000 MCG/ML injection, Inject 10010m once weekly for 6 weeks, then once a month .  folic acid (FOLVITE) 1 MG tablet, Take 1 mg by mouth daily.  Current Outpatient Medications (Other):  .  amoxicillin-clavulanate (AUGMENTIN) 875-125 MG tablet, Take 1 tablet by mouth 2 (two) times daily. 7 days .  escitalopram (LEXAPRO)  20 MG tablet, Take 1 tablet (20 mg total) by mouth daily. .  meclizine (ANTIVERT) 25 MG tablet, Take 1 tablet (25 mg total) by mouth 3 (three) times daily as needed for dizziness. .  Menthol, Topical Analgesic, (BIOFREEZE ROLL-ON EX), Apply topically daily as needed. .  methotrexate 50 MG/2ML injection, Inject 2 mLs into the muscle once a week.  .  pantoprazole (PROTONIX) 20 MG tablet, Take 1 tablet Daily for Indigestion & Heartburn (Patient taking differently: Take 20 mg by mouth daily as needed for heartburn or indigestion. Take 1 tablet Daily for Indigestion & Heartburn) .  triamcinolone cream (KENALOG) 0.5 %, Apply 1 application topically 2 (two) times daily. .  Vitamin D, Ergocalciferol, (DRISDOL) 1.25 MG (50000 UNIT) CAPS capsule, TAKE 1 CAPSULE BY MOUTH 3 DAYS A WEEK FOR VITAMIN DEFICIENCY .  VOLTAREN 1 % GEL, Apply 2 g topically 4 (four) times daily as needed (joint pain).  Medical History:  Past Medical  History:  Diagnosis Date  . Allergy   . Anemia   . Arthritis    "knees, hands, ankles, ~ every joint" (04/08/2015)  . Basal cell carcinoma    "face, hands, chest" (04/08/2015)  . Chronic bronchitis ([place])    "get it pretty much q yr" (04/08/2015)  . Dysrhythmia    palpitations occ; SVT 09/2013 s/p adenosine  . GERD (gastroesophageal reflux disease)   . H/O hiatal hernia   . Hx of cardiovascular stress test    Lexiscan Myoview 6/16: Ejection fraction is 60% and wall motion is normal. The study is normal. There is no scar or ischemia. This is a low risk scan.  . Hyperlipidemia   . Hypertension   . Hypothyroidism   . Palpitations   . Pneumonia 2015 X 1  . PONV (postoperative nausea and vomiting)   . Pre-diabetes   . S/P total knee arthroplasty 02/15/2014  . Sleep apnea   . Thyroid disease   . Vitamin D deficiency     Allergies Allergies  Allergen Reactions  . Darvon [Propoxyphene Hcl] Anaphylaxis and Other (See Comments)    Airway swelling   . Propoxyphene Anaphylaxis     Throat closes up  . Doxycycline Nausea Only  . Tamiflu [Oseltamivir Phosphate] Hives and Other (See Comments)    Increased blood pressure   Surgical History: reviewed and unchanged Family History: reviewed and unchanged Social History: reviewed and unchanged  Review of Systems  Constitutional: Negative for chills, diaphoresis, fever, malaise/fatigue and weight loss.  HENT: Negative.   Eyes: Negative.   Respiratory: Negative for cough, shortness of breath and wheezing.   Cardiovascular: Negative for chest pain, palpitations, orthopnea, claudication, leg swelling and PND.  Gastrointestinal: Negative.   Genitourinary: Negative.   Musculoskeletal: Positive for back pain. Negative for falls, joint pain, myalgias and neck pain.  Skin: Negative.   Neurological: Negative for dizziness, tingling, tremors, sensory change, speech change, focal weakness, seizures, loss of consciousness, weakness and headaches.  Psychiatric/Behavioral: Negative for depression, hallucinations, memory loss, substance abuse and suicidal ideas. The patient is not nervous/anxious and does not have insomnia.     Physical Exam: There were no vitals taken for this visit. Wt Readings from Last 3 Encounters:  01/22/20 216 lb (98 kg)  12/10/19 223 lb (101.2 kg)  10/22/19 225 lb (102.1 kg)   General Appearance: Well nourished, in no apparent distress. Eyes: PERRLA, EOMs, conjunctiva no swelling or erythema Sinuses: No Frontal/maxillary tenderness ENT/Mouth: Ext aud canals clear, TMs without erythema, bulging. No erythema, swelling, or exudate on post pharynx.  Tonsils not swollen or erythematous. Hearing normal.  Neck: Supple, thyroid normal.  Respiratory: Respiratory effort normal, BS equal bilaterally with wheezing left lower lung rhonchi, or stridor.  Cardio: RRR with no MRGs. Brisk peripheral pulses with 2+ edema.  Abdomen: Soft, + BS, obese Non tender, no guarding, rebound, hernias, masses. Lymphatics: Non tender  without lymphadenopathy.  Musculoskeletal: Full ROM, 5/5 strength, antalgic gait Skin: Warm, dry without rashes, lesions, ecchymosis.  Neuro: Cranial nerves intact. Normal muscle tone, no cerebellar symptoms. Sensation intact.  Psych: Awake and oriented X 3, normal affect, Insight and Judgment appropriate.    Vicie Mutters, PA-C 1:18 PM [facility]

## 2020-03-24 ENCOUNTER — Ambulatory Visit: Payer: 59 | Admitting: Physician Assistant

## 2020-03-30 ENCOUNTER — Other Ambulatory Visit: Payer: Self-pay | Admitting: Physician Assistant

## 2020-04-06 ENCOUNTER — Other Ambulatory Visit: Payer: Self-pay

## 2020-04-06 ENCOUNTER — Ambulatory Visit: Payer: 59 | Admitting: Physician Assistant

## 2020-04-06 ENCOUNTER — Encounter: Payer: Self-pay | Admitting: Physician Assistant

## 2020-04-06 VITALS — BP 126/88 | HR 100 | Temp 97.3°F | Wt 217.0 lb

## 2020-04-06 DIAGNOSIS — E079 Disorder of thyroid, unspecified: Secondary | ICD-10-CM

## 2020-04-06 DIAGNOSIS — Z9081 Acquired absence of spleen: Secondary | ICD-10-CM

## 2020-04-06 DIAGNOSIS — I471 Supraventricular tachycardia, unspecified: Secondary | ICD-10-CM

## 2020-04-06 DIAGNOSIS — E782 Mixed hyperlipidemia: Secondary | ICD-10-CM

## 2020-04-06 DIAGNOSIS — I10 Essential (primary) hypertension: Secondary | ICD-10-CM

## 2020-04-06 DIAGNOSIS — J449 Chronic obstructive pulmonary disease, unspecified: Secondary | ICD-10-CM | POA: Diagnosis not present

## 2020-04-06 DIAGNOSIS — G4736 Sleep related hypoventilation in conditions classified elsewhere: Secondary | ICD-10-CM

## 2020-04-06 DIAGNOSIS — E538 Deficiency of other specified B group vitamins: Secondary | ICD-10-CM

## 2020-04-06 DIAGNOSIS — I700 Atherosclerosis of aorta: Secondary | ICD-10-CM | POA: Diagnosis not present

## 2020-04-06 DIAGNOSIS — R7309 Other abnormal glucose: Secondary | ICD-10-CM

## 2020-04-06 DIAGNOSIS — Z79899 Other long term (current) drug therapy: Secondary | ICD-10-CM

## 2020-04-06 DIAGNOSIS — E559 Vitamin D deficiency, unspecified: Secondary | ICD-10-CM

## 2020-04-06 DIAGNOSIS — M069 Rheumatoid arthritis, unspecified: Secondary | ICD-10-CM

## 2020-04-06 DIAGNOSIS — Z130 Encounter for screening for diseases of the blood and blood-forming organs and certain disorders involving the immune mechanism: Secondary | ICD-10-CM

## 2020-04-06 DIAGNOSIS — J439 Emphysema, unspecified: Secondary | ICD-10-CM

## 2020-04-06 NOTE — Progress Notes (Signed)
FOLLOW UP  Assessment and Plan:   Aortic atherosclerosis (HCC) Control blood pressure, cholesterol, glucose, increase exercise.   PSVT (paroxysmal supraventricular tachycardia) (HCC) Continue cardio follow up  SVT (supraventricular tachycardia) (HCC) Continue cardio follow up  COPD GOLD 0/ former smoker at risk No triggers, well controlled symptoms, cont to monitor  Nocturnal hypoxemia due to emphysema (HCC) Monitor  Rheumatoid arthritis, involving unspecified site, unspecified whether rheumatoid factor present ([place]) Continue follow up   Morbid obesity ([place]) - follow up 3 months for progress monitoring - increase veggies, decrease carbs - long discussion about weight loss, diet, and exercise - reach water goal, add in exercise.   Mixed hyperlipidemia check lipids decrease fatty foods increase activity.   Thyroid disease Hypothyroidism-check TSH level, continue medications the same, reminded to take on an empty stomach 30-21mns before food.  -     TSH  Vitamin D deficiency Check levels, should have decreased amount  Essential hypertension - continue medications, DASH diet, exercise and monitor at home. Call if greater than 130/80.   Continue diet and meds as discussed. Further disposition pending results of labs. Future Appointments  Date Time Provider DHollis 08/31/2020  3:00 PM CVicie Mutters PA-C [name] None    HPI 59y.o. female  presents for 3 month follow up with hypertension, hyperlipidemia, prediabetes and vitamin D.  Going to retire next Oct  Her blood pressure has been controlled at home, her BP is doing better with ziac 524m Benicar 2078mtoday their BP is BP: 126/88   Patient has history of ablation for SVT with Dr. NisLajuana Ripple 2016.   She has a history of RA.  Following with Dr. BeeAmil Amenhe is on orencia but states her RA is getting worse.    She had the gastric sleeve Oct 13th, highest weight was 273. She has gone back to  smoking, a few everyday. States her knees are better.  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 She has not met her fluid intake, she has met her protein intake.  Wt Readings from Last 3 Encounters:  04/06/20 217 lb (98.4 kg)  01/22/20 216 lb (98 kg)  12/10/19 223 lb (101.2 kg)   She is on 33m71m lexapro and she is doing better. She does not workout.  She denies chest pain, shortness of breath, dizziness.  She is not on cholesterol medication, family history of MI, GM at 74, 29m at 79 a79 denies myalgias. Had recent Stress test 03/2019, normal Echo, did show LVH.  Her cholesterol is not at goal. The cholesterol last visit was:   Lab Results  Component Value Date   CHOL 169 12/10/2019   HDL 42 (L) 12/10/2019   LDLCALC 104 (H) 12/10/2019   TRIG 124 12/10/2019   CHOLHDL 4.0 12/10/2019   She has been working on diet and exercise for prediabetes, and denies paresthesia of the feet, polydipsia and polyuria. Last A1C in the office was:  Lab Results  Component Value Date   HGBA1C 5.7 (H) 12/10/2019   Patient is on Vitamin D supplement, went from 3 days a week to 1 day a week.  Lab Results  Component Value Date   VD25OH >150 (H) 12/10/2019     She is on thyroid medication, she did not change it last visit, not on a biotin. Her medication is on 137mc59mily with water 1 hour before food. Lab Results  Component Value Date   TSH 0.27 (L) 12/10/2019  .  She has a B12 def, on B12 injections, got one last week.  Lab Results  Component Value Date   [id number] 829 12/10/2019    Current Medications:   Current Outpatient Medications (Endocrine & Metabolic):  .  levothyroxine (SYNTHROID) 137 MCG tablet, Take 1 tablet daily on an empty stomach with only water for 30 minutes & no Antacid meds, Calcium or Magnesium for 4 hours & avoid Biotin  Current Outpatient Medications (Cardiovascular):  .   bisoprolol-hydrochlorothiazide (ZIAC) 5-6.25 MG tablet, Take 1 tablet Daily for BP .  olmesartan (BENICAR) 20 MG tablet, TAKE 2 TABLETS ($RemoveBe'40MG'eXmYRmIET$ ) BY MOUTH DAILY  Current Outpatient Medications (Respiratory):  .  albuterol (VENTOLIN HFA) 108 (90 Base) MCG/ACT inhaler, Inhale 2 puffs into the lungs every 4 (four) hours as needed for wheezing or shortness of breath. .  Budeson-Glycopyrrol-Formoterol (BREZTRI AEROSPHERE) 160-9-4.8 MCG/ACT AERO, Inhale 1 Pump into the lungs in the morning and at bedtime. .  diphenhydrAMINE (BENADRYL) 25 MG tablet, Take 25 mg by mouth daily.  .  promethazine-dextromethorphan (PROMETHAZINE-DM) 6.25-15 MG/5ML syrup, Take 5 mLs by mouth 4 (four) times daily as needed for cough.  Current Outpatient Medications (Analgesics):  Marland Kitchen  Abatacept (ORENCIA IV), Inject 7.5 mLs into the skin every 30 (thirty) days. Marland Kitchen  acetaminophen (TYLENOL) 650 MG CR tablet, Take 650 mg by mouth 3 (three) times daily as needed for pain.  Current Outpatient Medications (Hematological):  .  cyanocobalamin (,VITAMIN B-12,) 1000 MCG/ML injection, Inject 1070mcg once weekly for 6 weeks, then once a month .  folic acid (FOLVITE) 1 MG tablet, Take 1 mg by mouth daily.  Current Outpatient Medications (Other):  .  escitalopram (LEXAPRO) 20 MG tablet, Take 1 tablet (20 mg total) by mouth daily. .  meclizine (ANTIVERT) 25 MG tablet, Take 1 tablet (25 mg total) by mouth 3 (three) times daily as needed for dizziness. .  Menthol, Topical Analgesic, (BIOFREEZE ROLL-ON EX), Apply topically daily as needed. .  methotrexate 50 MG/2ML injection, Inject 2 mLs into the muscle once a week.  .  pantoprazole (PROTONIX) 20 MG tablet, Take 1 tablet Daily for Indigestion & Heartburn (Patient taking differently: Take 20 mg by mouth daily as needed for heartburn or indigestion. Take 1 tablet Daily for Indigestion & Heartburn) .  triamcinolone cream (KENALOG) 0.5 %, Apply 1 application topically 2 (two) times daily. .  Vitamin  D, Ergocalciferol, (DRISDOL) 1.25 MG (50000 UNIT) CAPS capsule, TAKE 1 CAPSULE BY MOUTH 3 DAYS A WEEK FOR VITAMIN DEFICIENCY .  VOLTAREN 1 % GEL, Apply 2 g topically 4 (four) times daily as needed (joint pain).  Medical History:  Past Medical History:  Diagnosis Date  . Allergy   . Anemia   . Arthritis    "knees, hands, ankles, ~ every joint" (04/08/2015)  . Basal cell carcinoma    "face, hands, chest" (04/08/2015)  . Chronic bronchitis ([place])    "get it pretty much q yr" (04/08/2015)  . Dysrhythmia    palpitations occ; SVT 09/2013 s/p adenosine  . GERD (gastroesophageal reflux disease)   . H/O hiatal hernia   . Hx of cardiovascular stress test    Lexiscan Myoview 6/16: Ejection fraction is 60% and wall motion is normal. The study is normal. There is no scar or ischemia. This is a low risk scan.  . Hyperlipidemia   . Hypertension   . Hypothyroidism   . Palpitations   . Pneumonia 2015 X 1  .  PONV (postoperative nausea and vomiting)   . Pre-diabetes   . S/P total knee arthroplasty 02/15/2014  . Sleep apnea   . Thyroid disease   . Vitamin D deficiency     Allergies Allergies  Allergen Reactions  . Darvon [Propoxyphene Hcl] Anaphylaxis and Other (See Comments)    Airway swelling   . Propoxyphene Anaphylaxis    Throat closes up  . Doxycycline Nausea Only  . Tamiflu [Oseltamivir Phosphate] Hives and Other (See Comments)    Increased blood pressure   Surgical History: reviewed and unchanged Family History: reviewed and unchanged Social History: reviewed and unchanged  Review of Systems  Constitutional: Negative for chills, diaphoresis, fever, malaise/fatigue and weight loss.  HENT: Negative.   Eyes: Negative.   Respiratory: Negative for cough, shortness of breath and wheezing.   Cardiovascular: Negative for chest pain, palpitations, orthopnea, claudication, leg swelling and PND.  Gastrointestinal: Negative.   Genitourinary: Negative.   Musculoskeletal: Positive for back  pain. Negative for falls, joint pain, myalgias and neck pain.  Skin: Negative.   Neurological: Negative for dizziness, tingling, tremors, sensory change, speech change, focal weakness, seizures, loss of consciousness, weakness and headaches.  Psychiatric/Behavioral: Negative for depression, hallucinations, memory loss, substance abuse and suicidal ideas. The patient is not nervous/anxious and does not have insomnia.     Physical Exam: BP 126/88   Pulse 100   Temp (!) 97.3 F (36.3 C)   Wt 217 lb (98.4 kg)   SpO2 97%   BMI 35.56 kg/m  Wt Readings from Last 3 Encounters:  04/06/20 217 lb (98.4 kg)  01/22/20 216 lb (98 kg)  12/10/19 223 lb (101.2 kg)   General Appearance: Well nourished, in no apparent distress. Eyes: PERRLA, EOMs, conjunctiva no swelling or erythema Sinuses: No Frontal/maxillary tenderness ENT/Mouth: Ext aud canals clear, TMs without erythema, bulging. No erythema, swelling, or exudate on post pharynx.  Tonsils not swollen or erythematous. Hearing normal.  Neck: Supple, thyroid normal.  Respiratory: Respiratory effort normal, BS equal bilaterally with wheezing left lower lung rhonchi, or stridor.  Cardio: RRR with no MRGs. Brisk peripheral pulses with 2+ edema.  Abdomen: Soft, + BS, obese Non tender, no guarding, rebound, hernias, masses. Lymphatics: Non tender without lymphadenopathy.  Musculoskeletal: Full ROM, 5/5 strength, antalgic gait Skin: Warm, dry without rashes, lesions, ecchymosis.  Neuro: Cranial nerves intact. Normal muscle tone, no cerebellar symptoms. Sensation intact.  Psych: Awake and oriented X 3, normal affect, Insight and Judgment appropriate.    Vicie Mutters, PA-C 3:40 PM [facility]

## 2020-04-07 LAB — HEMOGLOBIN A1C
Hgb A1c MFr Bld: 5.6 % of total Hgb (ref <5.7)
Mean Plasma Glucose: 114 (calc)
eAG (mmol/L): 6.3 (calc)

## 2020-04-07 LAB — CBC WITH DIFFERENTIAL/PLATELET
Absolute Monocytes: 1000 cells/uL — ABNORMAL HIGH (ref 200–950)
Basophils Absolute: 40 cells/uL (ref 0–200)
Basophils Relative: 0.4 %
Eosinophils Absolute: 71 cells/uL (ref 15–500)
Eosinophils Relative: 0.7 %
HCT: 32.1 % — ABNORMAL LOW (ref 35.0–45.0)
Hemoglobin: 10.8 g/dL — ABNORMAL LOW (ref 11.7–15.5)
Lymphs Abs: 3262 cells/uL (ref 850–3900)
MCH: 33.1 pg — ABNORMAL HIGH (ref 27.0–33.0)
MCHC: 33.6 g/dL (ref 32.0–36.0)
MCV: 98.5 fL (ref 80.0–100.0)
MPV: 12 fL (ref 7.5–12.5)
Monocytes Relative: 9.9 %
Neutro Abs: 5727 cells/uL (ref 1500–7800)
Neutrophils Relative %: 56.7 %
Platelets: 241 10*3/uL (ref 140–400)
RBC: 3.26 10*6/uL — ABNORMAL LOW (ref 3.80–5.10)
RDW: 13.1 % (ref 11.0–15.0)
Total Lymphocyte: 32.3 %
WBC: 10.1 10*3/uL (ref 3.8–10.8)

## 2020-04-07 LAB — COMPLETE METABOLIC PANEL WITH GFR
AG Ratio: 1.9 (calc) (ref 1.0–2.5)
ALT: 13 U/L (ref 6–29)
AST: 14 U/L (ref 10–35)
Albumin: 4 g/dL (ref 3.6–5.1)
Alkaline phosphatase (APISO): 78 U/L (ref 37–153)
BUN: 18 mg/dL (ref 7–25)
CO2: 27 mmol/L (ref 20–32)
Calcium: 9.2 mg/dL (ref 8.6–10.4)
Chloride: 106 mmol/L (ref 98–110)
Creat: 0.72 mg/dL (ref 0.50–1.05)
GFR, Est African American: 108 mL/min/{1.73_m2} (ref >=60)
GFR, Est Non African American: 93 mL/min/{1.73_m2} (ref >=60)
Globulin: 2.1 g/dL (calc) (ref 1.9–3.7)
Glucose, Bld: 90 mg/dL (ref 65–99)
Potassium: 3.7 mmol/L (ref 3.5–5.3)
Sodium: 140 mmol/L (ref 135–146)
Total Bilirubin: 0.2 mg/dL (ref 0.2–1.2)
Total Protein: 6.1 g/dL (ref 6.1–8.1)

## 2020-04-07 LAB — IRON, TOTAL/TOTAL IRON BINDING CAP
%SAT: 13 % (calc) — ABNORMAL LOW (ref 16–45)
Iron: 44 ug/dL — ABNORMAL LOW (ref 45–160)
TIBC: 343 mcg/dL (calc) (ref 250–450)

## 2020-04-07 LAB — LIPID PANEL
Cholesterol: 154 mg/dL (ref <200)
HDL: 44 mg/dL — ABNORMAL LOW (ref >=50)
LDL Cholesterol (Calc): 85 mg/dL (calc)
Non-HDL Cholesterol (Calc): 110 mg/dL (calc) (ref <130)
Total CHOL/HDL Ratio: 3.5 (calc) (ref <5.0)
Triglycerides: 155 mg/dL — ABNORMAL HIGH (ref <150)

## 2020-04-07 LAB — MAGNESIUM: Magnesium: 2.1 mg/dL (ref 1.5–2.5)

## 2020-04-07 LAB — VITAMIN B12: Vitamin B-12: 395 pg/mL (ref 200–1100)

## 2020-04-07 LAB — VITAMIN D 25 HYDROXY (VIT D DEFICIENCY, FRACTURES): Vit D, 25-Hydroxy: 86 ng/mL (ref 30–100)

## 2020-04-07 LAB — TSH: TSH: 0.05 mIU/L — ABNORMAL LOW (ref 0.40–4.50)

## 2020-04-25 ENCOUNTER — Other Ambulatory Visit: Payer: Self-pay | Admitting: Physician Assistant

## 2020-04-25 DIAGNOSIS — E559 Vitamin D deficiency, unspecified: Secondary | ICD-10-CM

## 2020-04-29 ENCOUNTER — Other Ambulatory Visit: Payer: Self-pay | Admitting: Internal Medicine

## 2020-04-29 DIAGNOSIS — E039 Hypothyroidism, unspecified: Secondary | ICD-10-CM

## 2020-05-26 ENCOUNTER — Other Ambulatory Visit: Payer: Self-pay | Admitting: Physician Assistant

## 2020-06-18 ENCOUNTER — Other Ambulatory Visit: Payer: Self-pay | Admitting: Internal Medicine

## 2020-06-21 ENCOUNTER — Other Ambulatory Visit: Payer: Self-pay | Admitting: Adult Health Nurse Practitioner

## 2020-06-25 ENCOUNTER — Other Ambulatory Visit: Payer: Self-pay | Admitting: Internal Medicine

## 2020-08-31 ENCOUNTER — Encounter: Payer: 59 | Admitting: Adult Health Nurse Practitioner

## 2020-08-31 NOTE — Progress Notes (Deleted)
COMPLETE PHYSICAL  Assessment and Plan:  HAD RECENT LABS 2 WEEKS AGO WITH A1C/CHOL  Encounter for general adult medical examination with abnormal findings 1 year  Aortic atherosclerosis ([place]) Control blood pressure, cholesterol, glucose, increase exercise.   PSVT (paroxysmal supraventricular tachycardia) (HCC) Continue cardio follow up  SVT (supraventricular tachycardia) (HCC) Continue cardio follow up  COPD GOLD 0/ former smoker at risk No triggers, well controlled symptoms, cont to monitor  Nocturnal hypoxemia due to emphysema (HCC) Monitor  Rheumatoid arthritis, involving unspecified site, unspecified whether rheumatoid factor present ([place]) Continue follow up   Morbid obesity ([place]) - follow up 3 months for progress monitoring - increase veggies, decrease carbs - long discussion about weight loss, diet, and exercise - reach water goal, add in exercise.   Mixed hyperlipidemia check lipids decrease fatty foods increase activity.   Thyroid disease Hypothyroidism-check TSH level, continue medications the same, reminded to take on an empty stomach 30-85mns before food.  -     TSH  Vitamin D deficiency Continue supplement  Adenoma of left adrenal gland Monitor  OSA on CPAP Continue CPAP nightly  Essential hypertension -     Urinalysis, Routine w reflex microscopic -     Microalbumin / creatinine urine ratio - continue medications, DASH diet, exercise and monitor at home. Call if greater than 130/80.   Polyp of cecum Monitor  Former smoker Monitor  H/O splenectomy -     Pneumococcal conjugate vaccine 13-valent IM -     meningococcal oligosaccharide (MENVEO) injection; Inject 0.5 mLs into the muscle once for 1 dose. -     meningococcal B (BEXSERO) SUSY injection; Inject 0.5 mLs into the muscle once for 1 dose. - will need penumovax 23 2-3 months after prevnar and then she will be complete of splenectomy vaccines until 5 year mark.   S/P laparoscopic  sleeve gastrectomy Continue follow up  Abnormal urine -     Urine Culture  Screening, anemia, deficiency, iron -     Iron,Total/Total Iron Binding Cap -     Vitamin B12 -     Ferritin  B12 deficiency -     Vitamin B12  Other orders -     triamcinolone cream (KENALOG) 0.5 %; Apply 1 application topically 2 (two) times daily. -     MICROSCOPIC MESSAGE    Continue diet and meds as discussed. Further disposition pending results of labs. Future Appointments  Date Time Provider [place] 08/31/2020  3:00 PM MGarnet Sierras NP [name] None  09/04/2021  3:00 PM MGarnet Sierras NP [name] None    HPI 60y.o. female  presents for 3 month follow up with hypertension, hyperlipidemia, prediabetes and vitamin D and CPE  Has had 2 exposures at work, last exposure was Thursday, with out mask on.   Her blood pressure has been controlled at home, her BP is doing better with ziac 524m Benicar 2011mnd lasix pill BUT she forgot her medications today, today their BP is     Patient has history of ablation for SVT with Dr. NisLajuana Ripple 2016.  She has a history of RA.  Following with Dr. BeeAmil Amen She is following with Derm on 20 mg of doxy.   She had the gastric sleeve Oct 13th, highest weight was 273. She has gone back to smoking, a few everyday. States her knees are better.  Due to complications she had a splenectomy BMI is There is no height or weight on file to calculate BMI., she  is working on diet and exercise. She has not met her fluid intake, she has met her protein intake.  Wt Readings from Last 3 Encounters:  04/06/20 217 lb (98.4 kg)  01/22/20 216 lb (98 kg)  12/10/19 223 lb (101.2 kg)   She is on $Rem'20mg'UAYN$  of lexapro and she is doing better. She does not workout.  She denies chest pain, shortness of breath, dizziness.  She is not on cholesterol medication, family history of MI, GM at 27, mom at 53 and denies myalgias. Had recent Stress test 03/2019, normal  Echo, did show LVH.  Her cholesterol is not at goal. The cholesterol last visit was:   Lab Results  Component Value Date   CHOL 154 04/06/2020   HDL 44 (L) 04/06/2020   LDLCALC 85 04/06/2020   TRIG 155 (H) 04/06/2020   CHOLHDL 3.5 04/06/2020   She has been working on diet and exercise for prediabetes, and denies paresthesia of the feet, polydipsia and polyuria. Last A1C in the office was:  Lab Results  Component Value Date   HGBA1C 5.6 04/06/2020   Patient is on Vitamin D supplement, she is on 50,000, 3 days a week- not taking well.    Lab Results  Component Value Date   VD25OH 52 04/06/2020     She is on thyroid medication. Her medication is on 143mcg daily with water 1 hour before food.- she admits she was off schedule but she is back on it since last visit.  Lab Results  Component Value Date   TSH 0.05 (L) 04/06/2020  .  She has a B12 def, on B12 injections, she has taken in Dec and Jan.   Lab Results  Component Value Date   VITAMINB12 395 04/06/2020    Current Medications:   Current Outpatient Medications (Endocrine & Metabolic):  .  levothyroxine (SYNTHROID) 137 MCG tablet, TAKE 1 TABLET DAILY ON AN EMPTY STOMACH WITH ONLY WATER FOR 30 MINUTES & NO ANTACID MEDS, CALCIUM OR MAGNESIUM FOR 4 HOURS & AVOID BIOTIN  Current Outpatient Medications (Cardiovascular):  .  bisoprolol-hydrochlorothiazide (ZIAC) 5-6.25 MG tablet, TAKE 1 TABLET DAILY FOR BLOOD PRESSURE .  olmesartan (BENICAR) 20 MG tablet, TAKE 2 TABLETS ($RemoveBe'40MG'ULGkuoZZG$ ) BY MOUTH DAILY  Current Outpatient Medications (Respiratory):  .  albuterol (VENTOLIN HFA) 108 (90 Base) MCG/ACT inhaler, Inhale 2 puffs into the lungs every 4 (four) hours as needed for wheezing or shortness of breath. .  Budeson-Glycopyrrol-Formoterol (BREZTRI AEROSPHERE) 160-9-4.8 MCG/ACT AERO, Inhale 1 Pump into the lungs in the morning and at bedtime. .  diphenhydrAMINE (BENADRYL) 25 MG tablet, Take 25 mg by mouth daily.  .  promethazine-dextromethorphan  (PROMETHAZINE-DM) 6.25-15 MG/5ML syrup, Take 5 mLs by mouth 4 (four) times daily as needed for cough.  Current Outpatient Medications (Analgesics):  Marland Kitchen  Abatacept (ORENCIA IV), Inject 7.5 mLs into the skin every 30 (thirty) days. Marland Kitchen  acetaminophen (TYLENOL) 650 MG CR tablet, Take 650 mg by mouth 3 (three) times daily as needed for pain.  Current Outpatient Medications (Hematological):  .  cyanocobalamin (,VITAMIN B-12,) 1000 MCG/ML injection, Inject 1032mcg once weekly for 6 weeks, then once a month .  folic acid (FOLVITE) 1 MG tablet, Take 1 mg by mouth daily.  Current Outpatient Medications (Other):  .  escitalopram (LEXAPRO) 20 MG tablet, TAKE 1 TABLET BY MOUTH EVERY DAY .  meclizine (ANTIVERT) 25 MG tablet, Take 1 tablet (25 mg total) by mouth 3 (three) times daily as needed for dizziness. .  Menthol, Topical Analgesic, (  BIOFREEZE ROLL-ON EX), Apply topically daily as needed. .  methotrexate 50 MG/2ML injection, Inject 2 mLs into the muscle once a week.  .  pantoprazole (PROTONIX) 20 MG tablet, TAKE 1 TABLET DAILY FOR INDIGESTION & HEARTBURN .  triamcinolone cream (KENALOG) 0.5 %, Apply 1 application topically 2 (two) times daily. .  Vitamin D, Ergocalciferol, (DRISDOL) 1.25 MG (50000 UNIT) CAPS capsule, TAKE 1 CAPSULE BY MOUTH 3 DAYS A WEEK FOR VITAMIN DEFICIENCY .  VOLTAREN 1 % GEL, Apply 2 g topically 4 (four) times daily as needed (joint pain).  Medical History:  Past Medical History:  Diagnosis Date  . Allergy   . Anemia   . Arthritis    "knees, hands, ankles, ~ every joint" (04/08/2015)  . Basal cell carcinoma    "face, hands, chest" (04/08/2015)  . Chronic bronchitis ([place])    "get it pretty much q yr" (04/08/2015)  . Dysrhythmia    palpitations occ; SVT 09/2013 s/p adenosine  . GERD (gastroesophageal reflux disease)   . H/O hiatal hernia   . Hx of cardiovascular stress test    Lexiscan Myoview 6/16: Ejection fraction is 60% and wall motion is normal. The study is normal.  There is no scar or ischemia. This is a low risk scan.  . Hyperlipidemia   . Hypertension   . Hypothyroidism   . Palpitations   . Pneumonia 2015 X 1  . PONV (postoperative nausea and vomiting)   . Pre-diabetes   . S/P total knee arthroplasty 02/15/2014  . Sleep apnea   . Thyroid disease   . Vitamin D deficiency     Allergies Allergies  Allergen Reactions  . Darvon [Propoxyphene Hcl] Anaphylaxis and Other (See Comments)    Airway swelling   . Propoxyphene Anaphylaxis    Throat closes up  . Doxycycline Nausea Only  . Tamiflu [Oseltamivir Phosphate] Hives and Other (See Comments)    Increased blood pressure    SURGICAL HISTORY She  has a past surgical history that includes Carpal tunnel release (Right, 1990); Tubal ligation (1987); Knee arthroscopy (Right, 2013); Bladder repair (1993); Endometrial ablation (2009); Excision basal cell carcinoma (2014); Partial knee arthroplasty (Right, 02/15/2014); Cardiac catheterization (N/A, 04/08/2015); Joint replacement; Incontinence surgery (2010); Knee arthroscopy; Cardiac surgery; Colonoscopy; Polypectomy; Colonoscopy with propofol (N/A, 11/25/2017); Cardiac catheterization (2017); Laparoscopic gastric sleeve resection (N/A, 05/26/2019); and Splenectomy, total (05/26/2019). FAMILY HISTORY Her family history includes Atrial fibrillation in her mother; Breast cancer in her paternal aunt; COPD in her father; Diabetes in her father; Emphysema in her father; Heart attack in her maternal grandmother; Heart disease in her maternal grandfather, paternal grandfather, and paternal grandmother; Hyperlipidemia in her maternal grandfather, paternal grandfather, and paternal grandmother; Hypertension in her mother; Stroke in her maternal grandmother. SOCIAL HISTORY She  reports that she quit smoking about 18 months ago. Her smoking use included cigarettes. She has a 60.00 pack-year smoking history. She has never used smokeless tobacco. She reports that she does not  drink alcohol and does not use drugs.   Immunization History  Administered Date(s) Administered  . Influenza,inj,quad, With Preservative 05/14/2019  . Influenza-Unspecified 05/14/2015, 05/27/2017, 05/13/2018  . Meningococcal B, OMV 06/19/2019  . Meningococcal Mcv4o 06/19/2019  . PFIZER(Purple Top)SARS-COV-2 Vaccination 10/23/2019, 11/18/2019, 06/02/2020  . PPD Test 12/02/2013  . Pneumococcal Conjugate-13 09/01/2019  . Pneumococcal Polysaccharide-23 04/08/2012, 04/06/2020  . Td 10/16/2004  . Tdap 07/24/2017   MAINTENANCE: Colonoscopy: >20 year ago wnl/ overdue EGD: 2011 Mammo: 05/07/12 WNL, 2014 PER PT WNL BMD: NO HX Pap/  Pelvic: GYN 2010? KMQ:KMMNOT Dentist:q 6-12 month CXR:09/25/13  IMMUNIZATIONS: TD/Tdap: 2018 Pneumovax:04/08/12 Zostavax:N/A Influenza: 2019  Review of Systems  Constitutional: Negative for chills, diaphoresis, fever, malaise/fatigue and weight loss.  HENT: Negative.   Eyes: Negative.   Respiratory: Negative for cough, shortness of breath and wheezing.   Cardiovascular: Negative for chest pain, palpitations, orthopnea, claudication, leg swelling and PND.  Gastrointestinal: Negative.   Genitourinary: Negative.   Musculoskeletal: Positive for back pain. Negative for falls, joint pain, myalgias and neck pain.  Skin: Negative.   Neurological: Negative for dizziness, tingling, tremors, sensory change, speech change, focal weakness, seizures, loss of consciousness, weakness and headaches.  Psychiatric/Behavioral: Negative for depression, hallucinations, memory loss, substance abuse and suicidal ideas. The patient is not nervous/anxious and does not have insomnia.     Physical Exam: There were no vitals taken for this visit. Wt Readings from Last 3 Encounters:  04/06/20 217 lb (98.4 kg)  01/22/20 216 lb (98 kg)  12/10/19 223 lb (101.2 kg)   General Appearance: Well nourished, in no apparent distress. Eyes: PERRLA, EOMs, conjunctiva no swelling or  erythema Sinuses: No Frontal/maxillary tenderness ENT/Mouth: Ext aud canals clear, TMs without erythema, bulging. No erythema, swelling, or exudate on post pharynx.  Tonsils not swollen or erythematous. Hearing normal.  Neck: Supple, thyroid normal.  Respiratory: Respiratory effort normal, BS equal bilaterally with wheezing left lower lung rhonchi, or stridor.  Cardio: RRR with no MRGs. Brisk peripheral pulses with 2+ edema.  Abdomen: Soft, + BS, obese Non tender, no guarding, rebound, hernias, masses. Lymphatics: Non tender without lymphadenopathy.  Musculoskeletal: Full ROM, 5/5 strength, antalgic gait Skin: Warm, dry without rashes, lesions, ecchymosis.  Neuro: Cranial nerves intact. Normal muscle tone, no cerebellar symptoms. Sensation intact.  Psych: Awake and oriented X 3, normal affect, Insight and Judgment appropriate.    Garnet Sierras, NP 2:57 PM [facility]

## 2020-09-14 ENCOUNTER — Other Ambulatory Visit: Payer: Self-pay | Admitting: Adult Health Nurse Practitioner

## 2020-09-22 ENCOUNTER — Other Ambulatory Visit: Payer: Self-pay | Admitting: Adult Health

## 2020-09-28 ENCOUNTER — Encounter: Payer: Self-pay | Admitting: Adult Health Nurse Practitioner

## 2020-09-28 ENCOUNTER — Other Ambulatory Visit: Payer: Self-pay

## 2020-09-28 ENCOUNTER — Ambulatory Visit: Payer: 59 | Admitting: Adult Health Nurse Practitioner

## 2020-09-28 VITALS — BP 120/88 | HR 76 | Temp 97.5°F | Ht 65.0 in | Wt 225.0 lb

## 2020-09-28 DIAGNOSIS — J439 Emphysema, unspecified: Secondary | ICD-10-CM

## 2020-09-28 DIAGNOSIS — I700 Atherosclerosis of aorta: Secondary | ICD-10-CM

## 2020-09-28 DIAGNOSIS — R0989 Other specified symptoms and signs involving the circulatory and respiratory systems: Secondary | ICD-10-CM | POA: Diagnosis not present

## 2020-09-28 DIAGNOSIS — D3502 Benign neoplasm of left adrenal gland: Secondary | ICD-10-CM

## 2020-09-28 DIAGNOSIS — E559 Vitamin D deficiency, unspecified: Secondary | ICD-10-CM

## 2020-09-28 DIAGNOSIS — Z136 Encounter for screening for cardiovascular disorders: Secondary | ICD-10-CM

## 2020-09-28 DIAGNOSIS — Z130 Encounter for screening for diseases of the blood and blood-forming organs and certain disorders involving the immune mechanism: Secondary | ICD-10-CM

## 2020-09-28 DIAGNOSIS — K635 Polyp of colon: Secondary | ICD-10-CM

## 2020-09-28 DIAGNOSIS — M069 Rheumatoid arthritis, unspecified: Secondary | ICD-10-CM

## 2020-09-28 DIAGNOSIS — Z0000 Encounter for general adult medical examination without abnormal findings: Secondary | ICD-10-CM

## 2020-09-28 DIAGNOSIS — Z87891 Personal history of nicotine dependence: Secondary | ICD-10-CM

## 2020-09-28 DIAGNOSIS — I10 Essential (primary) hypertension: Secondary | ICD-10-CM

## 2020-09-28 DIAGNOSIS — E079 Disorder of thyroid, unspecified: Secondary | ICD-10-CM

## 2020-09-28 DIAGNOSIS — E039 Hypothyroidism, unspecified: Secondary | ICD-10-CM

## 2020-09-28 DIAGNOSIS — G4736 Sleep related hypoventilation in conditions classified elsewhere: Secondary | ICD-10-CM

## 2020-09-28 DIAGNOSIS — Z9989 Dependence on other enabling machines and devices: Secondary | ICD-10-CM

## 2020-09-28 DIAGNOSIS — R7309 Other abnormal glucose: Secondary | ICD-10-CM

## 2020-09-28 DIAGNOSIS — Z9884 Bariatric surgery status: Secondary | ICD-10-CM

## 2020-09-28 DIAGNOSIS — Z1321 Encounter for screening for nutritional disorder: Secondary | ICD-10-CM

## 2020-09-28 DIAGNOSIS — Z1389 Encounter for screening for other disorder: Secondary | ICD-10-CM

## 2020-09-28 DIAGNOSIS — J449 Chronic obstructive pulmonary disease, unspecified: Secondary | ICD-10-CM

## 2020-09-28 DIAGNOSIS — E782 Mixed hyperlipidemia: Secondary | ICD-10-CM

## 2020-09-28 DIAGNOSIS — I471 Supraventricular tachycardia, unspecified: Secondary | ICD-10-CM

## 2020-09-28 DIAGNOSIS — G4733 Obstructive sleep apnea (adult) (pediatric): Secondary | ICD-10-CM

## 2020-09-28 DIAGNOSIS — Z0001 Encounter for general adult medical examination with abnormal findings: Secondary | ICD-10-CM

## 2020-09-28 DIAGNOSIS — F419 Anxiety disorder, unspecified: Secondary | ICD-10-CM

## 2020-09-28 DIAGNOSIS — Z8781 Personal history of (healed) traumatic fracture: Secondary | ICD-10-CM

## 2020-09-28 MED ORDER — BUSPIRONE HCL 10 MG PO TABS: 10.0000 mg | ORAL_TABLET | Freq: Three times a day (TID) | ORAL | 1 refills | Status: DC

## 2020-09-28 NOTE — Progress Notes (Signed)
COMPLETE PHYSICAL  Assessment and Plan:   Encounter for general adult medical examination with abnormal findings Yearly  Aortic atherosclerosis ([place]) Control blood pressure, cholesterol, glucose, increase exercise.  Per CT 08/30/2017  PSVT (paroxysmal supraventricular tachycardia) (HCC) Continue cardio follow up  COPD GOLD 0/ former smoker at risk No triggers, well controlled symptoms, cont to monitor  Nocturnal hypoxemia due to emphysema (HCC) Monitor  Rheumatoid arthritis, involving unspecified site, unspecified whether rheumatoid factor present ([place]) Continue follow up   Morbid obesity ([place]) - follow up 3 months for progress monitoring - increase veggies, decrease carbs - long discussion about weight loss, diet, and exercise - reach water goal, add in exercise.   Mixed hyperlipidemia check lipids decrease fatty foods increase activity.   Hypothyroidism Taking levothyroxine 15mg daily half tablet Sat & Sun. Reminder to take on an empty stomach 30-62ms before first meal of the day. No antacid medications for 4 hours. Check TSH level, continue medications the same, reminded to take on an empty stomach 30-6052m before food.  -     TSH  Vitamin D deficiency Continue supplement  Adenoma of left adrenal gland Monitor  OSA on CPAP Continue CPAP nightly Continue CPAP/BiPAP, using nightly for at least 8 hours  Helping with daytime fatigue Weight loss still advised Discussed mask & tubing hygeine  Essential hypertension -     Urinalysis, Routine w reflex microscopic -     Microalbumin / creatinine urine ratio - continue medications, DASH diet, exercise and monitor at home. Call if greater than 130/80.   Polyp of cecum Monitor Colonoscopy UTD  Former smoker Monitor Started again, discussed with patient.  Anxiety Doing well on current regiment Continue Lexapro 47m35m Buspar 47mg56m Discussed stress management techniques   Discussed good sleep  hygiene Discussed increasing physical activity and exercise Increase water intake  H/O splenectomy -     Pneumococcal conjugate vaccine 13-valent IM -     meningococcal oligosaccharide (MENVEO) injection; Inject 0.5 mLs into the muscle once for 1 dose. -     meningococcal B (BEXSERO) SUSY injection; Inject 0.5 mLs into the muscle once for 1 dose. - will need penumovax 23 2-3 months after prevnar and then she will be complete of splenectomy vaccines until 5 year mark.   S/P laparoscopic sleeve gastrectomy Continue follow up Some tenderness, continue to monitor  Screening, anemia, deficiency, iron -     Iron,Total/Total Iron Binding Cap -     Vitamin B12  B12 deficiency -     Vitamin B12  Medication Management Continued    Further disposition pending results if labs check today. Discussed med's effects and SE's.   Over 30 minutes of face to face interview, exam, counseling, chart review, and critical decision making was performed.   Continue diet and meds as discussed. Further disposition pending results of labs. Future Appointments  Date Time Provider DeparIda6/2022 10:00 AM McClaGarnet SierrasGAAM-[name] None  09/28/2021 10:00 AM McClanahan, Kyra,[place] SewerGAAM-[name] None    HPI 59 y.69 female  presents for 3 month follow up with HTN, HLD, prediabetes and vitamin D and CPE  Today she reports she feels like her mood at work is less controlled and having a harder time deal with stress.  She is able to manage this at home.  She has pain on the right side of her neck and follows with the pain clinic and she has an injection March 1st.  She is taking routine tylenol, using a  heating pad and also Voltaren gel for this.  She is having some abdominal tenderness s/p gastric sleeve.  RLQ RUQ tenderness and reports it feels heavy.  LBM was today, takes miralax daily with breakfast. Denies any cramping, bloating, nausea or vomiting.  Her blood pressure has been controlled at  home, her BP is doing better with ziac 103m, Benicar 2102mand lasix pill BUT she forgot her medications today, today their BP is BP: 120/88   Patient has history of ablation for SVT with Dr. NiLajuana Ripplen 2016.  She has a history of RA.  Following with Dr. BeAmil Amennd last OV was 05/18/20  She had the gastric sleeve Oct 13th, highest weight was 273. She has gone back to smoking, a few everyday.   Due to complications she had a splenectomy BMI is Body mass index is 37.44 kg/m., she is working on diet and exercise. She has not met her fluid intake, she has met her protein intake.  Wt Readings from Last 3 Encounters:  09/28/20 225 lb (102.1 kg)  04/06/20 217 lb (98.4 kg)  01/22/20 216 lb (98 kg)   She is on 2049mf lexapro and she is doing better. She does not workout.  She denies chest pain, shortness of breath, dizziness.  She is not on cholesterol medication, family history of MI, GM at 74,35om at 79 86d denies myalgias. Had recent Stress test 03/2019, normal Echo, did show LVH.  Her cholesterol is not at goal. The cholesterol last visit was:   Lab Results  Component Value Date   CHOL 154 04/06/2020   HDL 44 (L) 04/06/2020   LDLCALC 85 04/06/2020   TRIG 155 (H) 04/06/2020   CHOLHDL 3.5 04/06/2020   She has been working on diet and exercise for prediabetes, and denies paresthesia of the feet, polydipsia and polyuria. Last A1C in the office was:  Lab Results  Component Value Date   HGBA1C 5.6 04/06/2020   Patient is on Vitamin D supplement, she is on 50,000, 3 days a week- not taking well.    Lab Results  Component Value Date   VD25OH 86 31/25/2021     She is on thyroid medication. Her medication is on 137m68maily with water 1 hour before food.- she admits she was off schedule but she is back on it since last visit.  Lab Results  Component Value Date   TSH 0.05 (L) 04/06/2020  .  She has a B12 def, on B12 injections, she has taken in Dec and Jan.   Lab Results  Component  Value Date   VITAMINB12 395 04/06/2020    Current Medications:   Current Outpatient Medications (Endocrine & Metabolic):  .  levothyroxine (SYNTHROID) 137 MCG tablet, TAKE 1 TABLET DAILY ON AN EMPTY STOMACH WITH ONLY WATER FOR 30 MINUTES & NO ANTACID MEDS, CALCIUM OR MAGNESIUM FOR 4 HOURS & AVOID BIOTIN  Current Outpatient Medications (Cardiovascular):  .  bisoprolol-hydrochlorothiazide (ZIAC) 5-6.25 MG tablet, TAKE 1 TABLET BY MOUTH EVERY DAY FOR BLOOD PRESSURE .  olmesartan (BENICAR) 20 MG tablet, TAKE 2 TABLETS (40MG) BY MOUTH DAILY  Current Outpatient Medications (Respiratory):  .  albuterol (VENTOLIN HFA) 108 (90 Base) MCG/ACT inhaler, Inhale 2 puffs into the lungs every 4 (four) hours as needed for wheezing or shortness of breath. .  Budeson-Glycopyrrol-Formoterol (BREZTRI AEROSPHERE) 160-9-4.8 MCG/ACT AERO, Inhale 1 Pump into the lungs in the morning and at bedtime. .  diphenhydrAMINE (BENADRYL) 25 MG tablet, Take 25 mg by mouth daily.  .Marland Kitchen  promethazine-dextromethorphan (PROMETHAZINE-DM) 6.25-15 MG/5ML syrup, Take 5 mLs by mouth 4 (four) times daily as needed for cough.  Current Outpatient Medications (Analgesics):  Marland Kitchen  Abatacept (ORENCIA IV), Inject 7.5 mLs into the skin every 30 (thirty) days. Marland Kitchen  acetaminophen (TYLENOL) 650 MG CR tablet, Take 650 mg by mouth 3 (three) times daily as needed for pain.  Current Outpatient Medications (Hematological):  .  cyanocobalamin (,VITAMIN B-12,) 1000 MCG/ML injection, Inject 106mg once weekly for 6 weeks, then once a month .  folic acid (FOLVITE) 1 MG tablet, Take 1 mg by mouth daily.  Current Outpatient Medications (Other):  .  escitalopram (LEXAPRO) 20 MG tablet, TAKE 1 TABLET BY MOUTH EVERY DAY .  meclizine (ANTIVERT) 25 MG tablet, Take 1 tablet (25 mg total) by mouth 3 (three) times daily as needed for dizziness. .  Menthol, Topical Analgesic, (BIOFREEZE ROLL-ON EX), Apply topically daily as needed. .  methotrexate 50 MG/2ML injection,  Inject 2 mLs into the muscle once a week.  .  pantoprazole (PROTONIX) 20 MG tablet, TAKE 1 TABLET DAILY FOR INDIGESTION & HEARTBURN .  triamcinolone cream (KENALOG) 0.5 %, Apply 1 application topically 2 (two) times daily. .  Vitamin D, Ergocalciferol, (DRISDOL) 1.25 MG (50000 UNIT) CAPS capsule, TAKE 1 CAPSULE BY MOUTH 3 DAYS A WEEK FOR VITAMIN DEFICIENCY .  VOLTAREN 1 % GEL, Apply 2 g topically 4 (four) times daily as needed (joint pain).  Medical History:  Past Medical History:  Diagnosis Date  . Allergy   . Anemia   . Arthritis    "knees, hands, ankles, ~ every joint" (04/08/2015)  . Basal cell carcinoma    "face, hands, chest" (04/08/2015)  . [place] bronchitis [place]    "get it pretty much q yr" (04/08/2015)  . Dysrhythmia    palpitations occ; SVT 09/2013 s/p adenosine  . GERD (gastroesophageal reflux disease)   . H/O hiatal hernia   . Hx of cardiovascular stress test    Lexiscan Myoview 6/16: Ejection fraction is 60% and wall motion is normal. The study is normal. There is no scar or ischemia. This is a low risk scan.  . Hyperlipidemia   . Hypertension   . Hypothyroidism   . Palpitations   . Pneumonia 2015 X 1  . PONV (postoperative nausea and vomiting)   . Pre-diabetes   . S/P total knee arthroplasty 02/15/2014  . Sleep apnea   . Thyroid disease   . Vitamin D deficiency     Allergies Allergies  Allergen Reactions  . Darvon [Propoxyphene Hcl] Anaphylaxis and Other (See Comments)    Airway swelling   . Propoxyphene Anaphylaxis    Throat closes up  . Doxycycline Nausea Only  . Tamiflu [Oseltamivir Phosphate] Hives and Other (See Comments)    Increased blood pressure    SURGICAL HISTORY She  has a past surgical history that includes Carpal tunnel release (Right, 1990); Tubal ligation (1987); Knee arthroscopy (Right, 2013); Bladder repair (1993); Endometrial ablation (2009); Excision basal cell carcinoma (2014); Partial knee arthroplasty (Right, 02/15/2014); Cardiac  catheterization (N/A, 04/08/2015); Joint replacement; Incontinence surgery (2010); Knee arthroscopy; Cardiac surgery; Colonoscopy; Polypectomy; Colonoscopy with propofol (N/A, 11/25/2017); Cardiac catheterization (2017); Laparoscopic gastric sleeve resection (N/A, 05/26/2019); and Splenectomy, total (05/26/2019). FAMILY HISTORY Her family history includes Atrial fibrillation in her mother; Breast cancer in her paternal aunt; COPD in her father; Diabetes in her father; Emphysema in her father; Heart attack in her maternal grandmother; Heart disease in her maternal grandfather, paternal grandfather, and paternal grandmother; Hyperlipidemia  in her maternal grandfather, paternal grandfather, and paternal grandmother; Hypertension in her mother; Stroke in her maternal grandmother. SOCIAL HISTORY She  reports that she quit smoking about 19 months ago. Her smoking use included cigarettes. She has a 60.00 pack-year smoking history. She has never used smokeless tobacco. She reports that she does not drink alcohol and does not use drugs.   Immunization History  Administered Date(s) Administered  . Influenza,inj,quad, With Preservative 05/14/2019  . Influenza-Unspecified 05/14/2015, 05/27/2017, 05/13/2018  . Meningococcal B, OMV 06/19/2019  . Meningococcal Mcv4o 06/19/2019  . PFIZER(Purple Top)SARS-COV-2 Vaccination 10/23/2019, 11/18/2019, 06/02/2020  . PPD Test 12/02/2013  . Pneumococcal Conjugate-13 09/01/2019  . Pneumococcal Polysaccharide-23 04/08/2012, 04/06/2020  . Td 10/16/2004  . Tdap 07/24/2017   MAINTENANCE: Colonoscopy: 11/2017 EGD: 2011 Mammogram: 02/2017, OVERDUE BMD: N/A Pap/ Pelvic: GYN 2010, OVERDUE JOI:NOMVEH Dentist:Q 6-12 month, DUE Derm: Last OV 2020, DUE  CXR:09/25/13  IMMUNIZATIONS: TD/Tdap: 2018 Pneumovax:04/08/12 Zostavax:N/A Influenza: 2019  Review of Systems  Constitutional: Negative for chills, diaphoresis, fever, malaise/fatigue and weight loss.  HENT: Negative.    Eyes: Negative.   Respiratory: Negative for cough, shortness of breath and wheezing.   Cardiovascular: Negative for chest pain, palpitations, orthopnea, claudication, leg swelling and PND.  Gastrointestinal: Negative.   Genitourinary: Negative.   Musculoskeletal: Positive for back pain. Negative for falls, joint pain, myalgias and neck pain.  Skin: Negative.   Neurological: Negative for dizziness, tingling, tremors, sensory change, speech change, focal weakness, seizures, loss of consciousness, weakness and headaches.  Psychiatric/Behavioral: Negative for depression, hallucinations, memory loss, substance abuse and suicidal ideas. The patient is not nervous/anxious and does not have insomnia.     Physical Exam: BP 120/88   Pulse 76   Temp (!) 97.5 F (36.4 C)   Ht 5' 5" (1.651 m)   Wt 225 lb (102.1 kg)   SpO2 96%   BMI 37.44 kg/m  Wt Readings from Last 3 Encounters:  09/28/20 225 lb (102.1 kg)  04/06/20 217 lb (98.4 kg)  01/22/20 216 lb (98 kg)   General Appearance: Well nourished, in no apparent distress. Eyes: PERRLA, EOMs, conjunctiva no swelling or erythema Sinuses: No Frontal/maxillary tenderness ENT/Mouth: Ext aud canals clear, TMs without erythema, bulging. No erythema, swelling, or exudate on post pharynx.  Tonsils not swollen or erythematous. Hearing normal.  Neck: Supple, thyroid normal.  Respiratory: Respiratory effort normal, BS equal bilaterally with wheezing left lower lung rhonchi, or stridor.  Cardio: RRR with no MRGs. Brisk peripheral pulses with 2+ edema.  Abdomen: Soft, + BS, obese Non tender, no guarding, rebound, hernias, masses. Lymphatics: Non tender without lymphadenopathy.  Musculoskeletal: Full ROM, 5/5 strength, antalgic gait Skin: Warm, dry without rashes, lesions, ecchymosis.  Neuro: Cranial nerves intact. Normal muscle tone, no cerebellar symptoms. Sensation intact.  Psych: Awake and oriented X 3, normal affect, Insight and Judgment appropriate.    MCN:OBSJ Loletha Grayer, No ST changes   Bayard Males, DNP [facility] 09/28/2020  9:55 AM

## 2020-09-28 NOTE — Patient Instructions (Addendum)
   We are going to start Buspirone (Buspar) 10mg  to helpw ith your anxiety.  Start taking one tablet daily for a couple days.  If tolerating well then increase to twice a day with a goal of three times a day.  HOW TO SCHEDULE your Mammogram and Bone Density, DEXA  [facility]  7 a.m.-6:30 p.m., Monday 7 a.m.-5 p.m., Tuesday-Friday Schedule an appointment by calling [phone].   GENERAL HEALTH GOALS  Know what a healthy weight is for you (roughly BMI <25) and aim to maintain this  Aim for 7+ servings of fruits and vegetables daily  70-80+ fluid ounces of water or unsweet tea for healthy kidneys  Limit to max 1 drink of alcohol per day; avoid smoking/tobacco  Limit animal fats in diet for cholesterol and heart health - choose grass fed whenever available  Avoid highly processed foods, and foods high in saturated/trans fats  Aim for low stress - take time to unwind and care for your mental health  Aim for 150 min of moderate intensity exercise weekly for heart health, and weights twice weekly for bone health  Aim for 7-9 hours of sleep daily

## 2020-10-01 LAB — URINALYSIS W MICROSCOPIC + REFLEX CULTURE
Bacteria, UA: NONE SEEN /HPF
Bilirubin Urine: NEGATIVE
Glucose, UA: NEGATIVE
Hyaline Cast: NONE SEEN /LPF
Ketones, ur: NEGATIVE
Nitrites, Initial: NEGATIVE
Protein, ur: NEGATIVE
Specific Gravity, Urine: 1.02 (ref 1.001–1.03)
pH: <=5 (ref 5.0–8.0)

## 2020-10-01 LAB — CBC WITH DIFFERENTIAL/PLATELET
Absolute Monocytes: 782 cells/uL (ref 200–950)
Basophils Absolute: 47 cells/uL (ref 0–200)
Basophils Relative: 0.6 %
Eosinophils Absolute: 63 cells/uL (ref 15–500)
Eosinophils Relative: 0.8 %
HCT: 37.9 % (ref 35.0–45.0)
Hemoglobin: 12.7 g/dL (ref 11.7–15.5)
Lymphs Abs: 2054 cells/uL (ref 850–3900)
MCH: 33.9 pg — ABNORMAL HIGH (ref 27.0–33.0)
MCHC: 33.5 g/dL (ref 32.0–36.0)
MCV: 101.1 fL — ABNORMAL HIGH (ref 80.0–100.0)
MPV: 11.1 fL (ref 7.5–12.5)
Monocytes Relative: 9.9 %
Neutro Abs: 4953 cells/uL (ref 1500–7800)
Neutrophils Relative %: 62.7 %
Platelets: 330 10*3/uL (ref 140–400)
RBC: 3.75 10*6/uL — ABNORMAL LOW (ref 3.80–5.10)
RDW: 13.3 % (ref 11.0–15.0)
Total Lymphocyte: 26 %
WBC: 7.9 10*3/uL (ref 3.8–10.8)

## 2020-10-01 LAB — IRON, TOTAL/TOTAL IRON BINDING CAP
%SAT: 42 % (calc) (ref 16–45)
Iron: 165 ug/dL — ABNORMAL HIGH (ref 45–160)
TIBC: 389 mcg/dL (calc) (ref 250–450)

## 2020-10-01 LAB — TSH: TSH: 1.98 mIU/L (ref 0.40–4.50)

## 2020-10-01 LAB — COMPLETE METABOLIC PANEL WITH GFR
AG Ratio: 1.7 (calc) (ref 1.0–2.5)
ALT: 8 U/L (ref 6–29)
AST: 10 U/L (ref 10–35)
Albumin: 4.2 g/dL (ref 3.6–5.1)
Alkaline phosphatase (APISO): 106 U/L (ref 37–153)
BUN: 20 mg/dL (ref 7–25)
CO2: 26 mmol/L (ref 20–32)
Calcium: 9.1 mg/dL (ref 8.6–10.4)
Chloride: 107 mmol/L (ref 98–110)
Creat: 0.82 mg/dL (ref 0.50–1.05)
GFR, Est African American: 92 mL/min/{1.73_m2} (ref >=60)
GFR, Est Non African American: 79 mL/min/{1.73_m2} (ref >=60)
Globulin: 2.5 g/dL (calc) (ref 1.9–3.7)
Glucose, Bld: 93 mg/dL (ref 65–99)
Potassium: 4.2 mmol/L (ref 3.5–5.3)
Sodium: 141 mmol/L (ref 135–146)
Total Bilirubin: 0.3 mg/dL (ref 0.2–1.2)
Total Protein: 6.7 g/dL (ref 6.1–8.1)

## 2020-10-01 LAB — LIPID PANEL
Cholesterol: 176 mg/dL (ref <200)
HDL: 50 mg/dL (ref >=50)
LDL Cholesterol (Calc): 96 mg/dL (calc)
Non-HDL Cholesterol (Calc): 126 mg/dL (calc) (ref <130)
Total CHOL/HDL Ratio: 3.5 (calc) (ref <5.0)
Triglycerides: 201 mg/dL — ABNORMAL HIGH (ref <150)

## 2020-10-01 LAB — URINE CULTURE
MICRO NUMBER:: 11543437
SPECIMEN QUALITY:: ADEQUATE

## 2020-10-01 LAB — HEMOGLOBIN A1C
Hgb A1c MFr Bld: 5.8 % of total Hgb — ABNORMAL HIGH (ref <5.7)
Mean Plasma Glucose: 120 mg/dL
eAG (mmol/L): 6.6 mmol/L

## 2020-10-01 LAB — CULTURE INDICATED

## 2020-10-01 LAB — VITAMIN B12: Vitamin B-12: 606 pg/mL (ref 200–1100)

## 2020-10-01 LAB — VITAMIN D 25 HYDROXY (VIT D DEFICIENCY, FRACTURES): Vit D, 25-Hydroxy: 31 ng/mL (ref 30–100)

## 2020-10-02 ENCOUNTER — Other Ambulatory Visit: Payer: Self-pay | Admitting: Adult Health Nurse Practitioner

## 2020-10-02 DIAGNOSIS — N3000 Acute cystitis without hematuria: Secondary | ICD-10-CM

## 2020-10-02 MED ORDER — CEPHALEXIN 500 MG PO CAPS: ORAL_CAPSULE | ORAL | 0 refills | Status: DC

## 2020-10-05 ENCOUNTER — Other Ambulatory Visit: Payer: Self-pay | Admitting: Adult Health

## 2020-10-05 DIAGNOSIS — E559 Vitamin D deficiency, unspecified: Secondary | ICD-10-CM

## 2020-10-08 ENCOUNTER — Other Ambulatory Visit: Payer: Self-pay

## 2020-10-08 ENCOUNTER — Ambulatory Visit
Admission: RE | Admit: 2020-10-08 | Discharge: 2020-10-08 | Disposition: A | Discharge status: Home / Self Care | Payer: 59 | Source: Ambulatory Visit | Attending: Adult Health Nurse Practitioner | Admitting: Adult Health Nurse Practitioner

## 2020-10-08 DIAGNOSIS — Z8781 Personal history of (healed) traumatic fracture: Secondary | ICD-10-CM

## 2020-10-14 ENCOUNTER — Other Ambulatory Visit: Payer: Self-pay | Admitting: Adult Health Nurse Practitioner

## 2020-10-19 DIAGNOSIS — N3000 Acute cystitis without hematuria: Secondary | ICD-10-CM

## 2020-10-19 MED ORDER — CEPHALEXIN 500 MG PO CAPS: ORAL_CAPSULE | ORAL | 0 refills | Status: DC

## 2020-10-20 ENCOUNTER — Other Ambulatory Visit: Payer: Self-pay | Admitting: Adult Health Nurse Practitioner

## 2020-10-20 ENCOUNTER — Encounter: Payer: Self-pay | Admitting: Adult Health

## 2020-10-20 DIAGNOSIS — F419 Anxiety disorder, unspecified: Secondary | ICD-10-CM | POA: Insufficient documentation

## 2020-10-24 ENCOUNTER — Other Ambulatory Visit: Payer: Self-pay | Admitting: Internal Medicine

## 2020-10-24 DIAGNOSIS — E039 Hypothyroidism, unspecified: Secondary | ICD-10-CM

## 2020-10-31 IMAGING — CR DG CHEST 2V
2 series · 2 of 2 positions shown · non-contrast
Comparison: Radiograph 01/09/2019, CT 08/14/2018

CLINICAL DATA: Cough, COPD, failed antibiotic therapy

EXAM:
CHEST - 2 VIEW

[w chest pa]
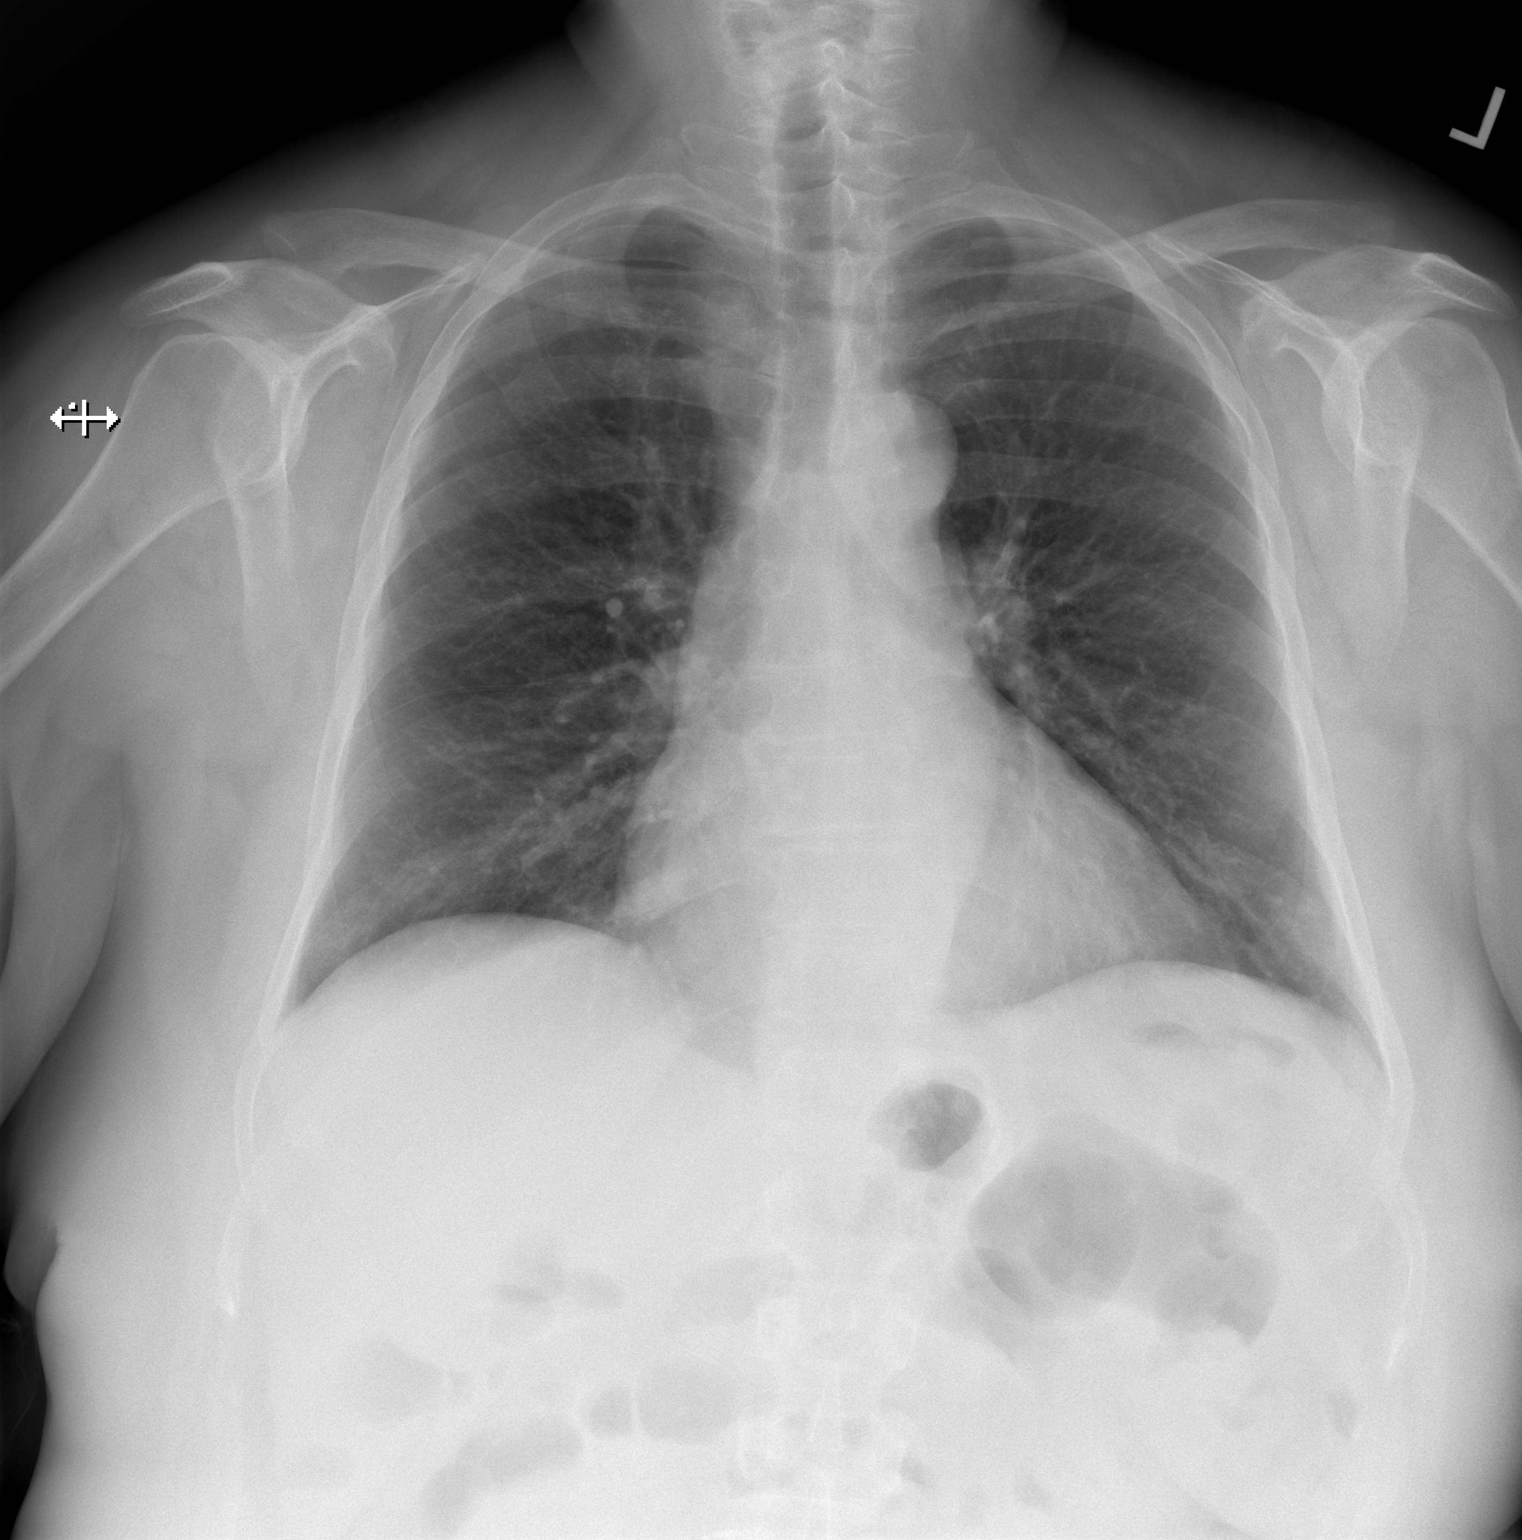

[w chest lat]
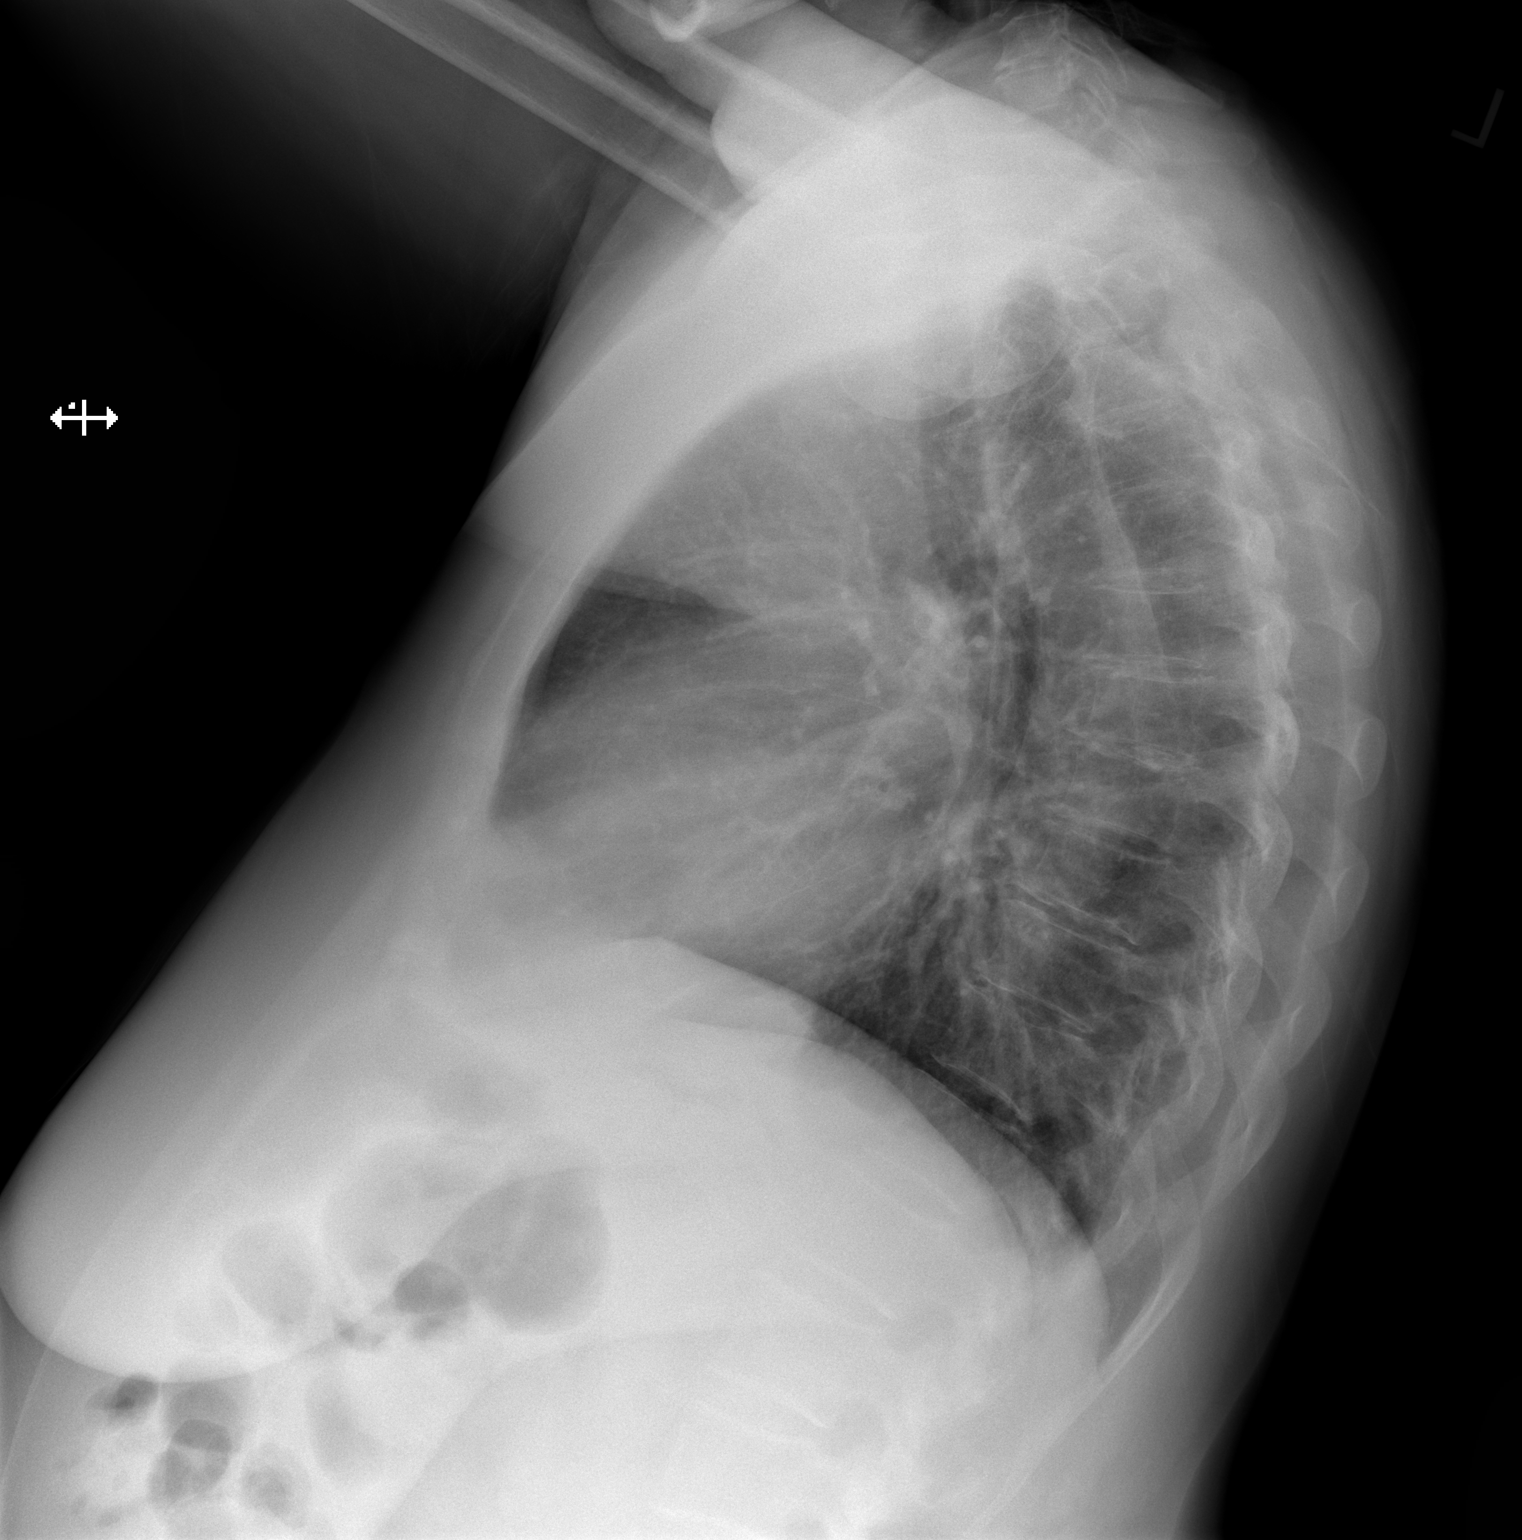

[2 of 2 positions shown; findings below may reference images not displayed]

FINDINGS: Mild flattening of the diaphragms could suggest some hyperinflation
or emphysematous changes. No consolidation, features of edema,
pneumothorax, or effusion. The cardiomediastinal contours are
unremarkable. No acute osseous or soft tissue abnormality.
Degenerative changes are present in the imaged spine and shoulders.
IMPRESSION: 1. No acute cardiopulmonary abnormality.
2. Features suggesting some hyperinflation and/or emphysematous
changes.

## 2020-12-14 ENCOUNTER — Other Ambulatory Visit: Payer: Self-pay | Admitting: Adult Health

## 2020-12-14 ENCOUNTER — Inpatient Hospital Stay: Admission: RE | Admit: 2020-12-14 | Payer: 59 | Source: Ambulatory Visit

## 2020-12-14 ENCOUNTER — Other Ambulatory Visit: Payer: Self-pay | Admitting: Internal Medicine

## 2020-12-14 DIAGNOSIS — Z1231 Encounter for screening mammogram for malignant neoplasm of breast: Secondary | ICD-10-CM

## 2020-12-15 ENCOUNTER — Encounter (HOSPITAL_COMMUNITY): Payer: Self-pay | Admitting: *Deleted

## 2021-01-05 ENCOUNTER — Ambulatory Visit: Payer: 59 | Admitting: Internal Medicine

## 2021-01-12 ENCOUNTER — Encounter: Payer: Self-pay | Admitting: Internal Medicine

## 2021-01-12 ENCOUNTER — Ambulatory Visit: Payer: 59 | Admitting: Internal Medicine

## 2021-01-12 NOTE — Progress Notes (Addendum)
NO SHOW

## 2021-01-14 ENCOUNTER — Other Ambulatory Visit: Payer: Self-pay | Admitting: Adult Health

## 2021-01-16 ENCOUNTER — Ambulatory Visit (INDEPENDENT_AMBULATORY_CARE_PROVIDER_SITE_OTHER): Payer: 59 | Admitting: Otolaryngology

## 2021-01-16 ENCOUNTER — Other Ambulatory Visit: Payer: Self-pay

## 2021-01-16 DIAGNOSIS — J310 Chronic rhinitis: Secondary | ICD-10-CM

## 2021-01-16 DIAGNOSIS — H903 Sensorineural hearing loss, bilateral: Secondary | ICD-10-CM

## 2021-01-16 NOTE — Progress Notes (Signed)
HPI: Sherry Dennis is a 60 y.o. female who presents for evaluation of complaints of chronic postnasal drainage.  Since starting Xyzal recently this is doing much better.  She also uses Flonase on a regular basis.  And she is just recently started using saline irrigation. She also complains of some mild hearing problems mostly at the complaints of her kids.  Past Medical History:  Diagnosis Date  . Allergy   . Anemia   . Arthritis    "knees, hands, ankles, ~ every joint" (04/08/2015)  . Basal cell carcinoma    "face, hands, chest" (04/08/2015)  . Chronic bronchitis ([place])    "get it pretty much q yr" (04/08/2015)  . Dysrhythmia    palpitations occ; SVT 09/2013 s/p adenosine  . GERD (gastroesophageal reflux disease)   . H/O hiatal hernia   . Hx of cardiovascular stress test    Lexiscan Myoview 6/16: Ejection fraction is 60% and wall motion is normal. The study is normal. There is no scar or ischemia. This is a low risk scan.  . Hyperlipidemia   . Hypertension   . Hypothyroidism   . Palpitations   . Pneumonia 2015 X 1  . PONV (postoperative nausea and vomiting)   . Pre-diabetes   . S/P total knee arthroplasty 02/15/2014  . Sleep apnea   . Thyroid disease   . Vitamin D deficiency    Past Surgical History:  Procedure Laterality Date  . BASAL CELL CARCINOMA EXCISION  2014   "off my chest"  . BLADDER REPAIR  1993   "lasered the holes shut"  . CARDIAC CATHETERIZATION  2017  . CARDIAC SURGERY    . CARPAL TUNNEL RELEASE Right 1990  . COLONOSCOPY    . COLONOSCOPY WITH PROPOFOL N/A 11/25/2017   Procedure: COLONOSCOPY WITH PROPOFOL;  Surgeon: Mauri Pole, MD;  [facility];  Service: Endoscopy;  Laterality: N/A;  . ELECTROPHYSIOLOGIC STUDY N/A 04/08/2015   Procedure: SVT Ablation;  Surgeon: Evans Lance, MD;  Location: [facility];  Service: Cardiovascular;  Laterality: N/A;  . ENDOMETRIAL ABLATION  2009  . INCONTINENCE SURGERY  2010  . JOINT REPLACEMENT    . KNEE  ARTHROSCOPY Right 2013  . KNEE ARTHROSCOPY    . LAPAROSCOPIC GASTRIC SLEEVE RESECTION N/A 05/26/2019   Procedure: LAPAROSCOPIC GASTRIC SLEEVE RESECTION, Upper Endo, ERAS Pathway;  Surgeon: Greer Pickerel, MD;  Location[facility];  Service: General;  Laterality: N/A;  . PARTIAL KNEE ARTHROPLASTY Right 02/15/2014   Procedure: RIGHT UNICOMPARTMENTAL KNEE (MEDIAL COMPARTMENT);  Surgeon: Vickey Huger, MD;  Location: [facility];  Service: Orthopedics;  Laterality: Right;  . POLYPECTOMY    . SPLENECTOMY, TOTAL  05/26/2019   Procedure: SPLENECTOMY;  Surgeon: Greer Pickerel, MD;  [facility];  Service: General;;  . TUBAL LIGATION  1987   Social History   Socioeconomic History  . Marital status: Divorced    Spouse name: Not on file  . Number of children: 2  . Years of education: Not on file  . Highest education level: Not on file  Occupational History  . Not on file  Tobacco Use  . Smoking status: Former Smoker    Packs/day: 1.50    Years: 40.00    Pack years: 60.00    Types: Cigarettes    Quit date: 02/2019    Years since quitting: 1.9  . Smokeless tobacco: Never Used  . Tobacco comment: 04/08/2015 "went from ~ 2 ppd to 3 cigarettes in 1 week"  Vaping  Use  . Vaping Use: Never used  Substance and Sexual Activity  . Alcohol use: No    Alcohol/week: 0.0 standard drinks    Comment: 04/08/2015 "couple drinks/year"  . Drug use: No  . Sexual activity: Not Currently  Other Topics Concern  . Not on file  Social History Narrative  . Not on file   Social Determinants of Health   Financial Resource Strain: Not on file  Food Insecurity: Not on file  Transportation Needs: Not on file  Physical Activity: Not on file  Stress: Not on file  Social Connections: Not on file   Family History  Problem Relation Age of Onset  . Hypertension Mother   . Atrial fibrillation Mother   . Diabetes Father   . Emphysema Father   . COPD Father   . Breast cancer Paternal Aunt        x 4 aunts,   . Stroke  Maternal Grandmother   . Heart attack Maternal Grandmother   . Hyperlipidemia Maternal Grandfather   . Heart disease Maternal Grandfather   . Hyperlipidemia Paternal Grandmother   . Heart disease Paternal Grandmother   . Hyperlipidemia Paternal Grandfather   . Heart disease Paternal Grandfather   . Colon cancer Neg Hx   . Colon polyps Neg Hx   . Esophageal cancer Neg Hx   . Rectal cancer Neg Hx   . Ulcerative colitis Neg Hx   . Stomach cancer Neg Hx    Allergies  Allergen Reactions  . Darvon [Propoxyphene Hcl] Anaphylaxis and Other (See Comments)    Airway swelling   . Propoxyphene Anaphylaxis    Throat closes up  . Doxycycline Nausea Only  . Tamiflu [Oseltamivir Phosphate] Hives and Other (See Comments)    Increased blood pressure   Prior to Admission medications   Medication Sig Start Date End Date Taking? Authorizing Provider  Abatacept (ORENCIA IV) Inject 7.5 mLs into the skin every 30 (thirty) days.    [provider]  acetaminophen (TYLENOL) 650 MG CR tablet Take 650 mg by mouth 3 (three) times daily as needed for pain.    [provider]  albuterol (VENTOLIN HFA) 108 (90 Base) MCG/ACT inhaler Inhale 2 puffs into the lungs every 4 (four) hours as needed for wheezing or shortness of breath. 01/22/20   Vladimir Crofts, PA-C  bisoprolol-hydrochlorothiazide (ZIAC) 5-6.25 MG tablet TAKE 1 TABLET BY MOUTH EVERY DAY FOR BLOOD PRESSURE 12/14/20   Liane Comber, NP  Budeson-Glycopyrrol-Formoterol (BREZTRI AEROSPHERE) 160-9-4.8 MCG/ACT AERO Inhale 1 Pump into the lungs in the morning and at bedtime. 01/22/20   Vladimir Crofts, PA-C  busPIRone (BUSPAR) 10 MG tablet TAKE 1 TABLET BY MOUTH THREE TIMES A DAY 10/20/20   Liane Comber, NP  cyanocobalamin (,VITAMIN B-12,) 1000 MCG/ML injection Inject 1058mcg once weekly for 6 weeks, then once a month 12/10/19   Vladimir Crofts, PA-C  diphenhydrAMINE (BENADRYL) 25 MG tablet Take 25 mg by mouth daily.    [provider]  escitalopram (LEXAPRO) 20 MG tablet TAKE 1 TABLET BY MOUTH EVERY DAY 06/21/20   Garnet Sierras, NP  folic acid (FOLVITE) 1 MG tablet Take 1 mg by mouth daily. 09/15/19   [provider]  levothyroxine (SYNTHROID) 137 MCG tablet TAKE 1 TABLET DAILY ON AN EMPTY STOMACH WITH ONLY WATER FOR 30 MINUTES & NO ANTACID MEDS, CALCIUM OR MAGNESIUM FOR 4 HOURS & AVOID BIOTIN 10/24/20   Liane Comber, NP  meclizine (ANTIVERT) 25 MG tablet Take 1 tablet (  25 mg total) by mouth 3 (three) times daily as needed for dizziness. 10/06/19   Long, Wonda Olds, MD  Menthol, Topical Analgesic, (BIOFREEZE ROLL-ON EX) Apply topically daily as needed.    [provider]  methotrexate 50 MG/2ML injection Inject 2 mLs into the muscle once a week.  10/05/19   [provider]  olmesartan (BENICAR) 40 MG tablet Take  1 tablet  Daily for BP 01/14/21   Unk Pinto, MD  pantoprazole (PROTONIX) 20 MG tablet TAKE 1 TABLET DAILY FOR INDIGESTION & HEARTBURN 06/18/20   Liane Comber, NP  triamcinolone cream (KENALOG) 0.5 % Apply 1 application topically 2 (two) times daily. 09/01/19   Vladimir Crofts, PA-C  Vitamin D, Ergocalciferol, (DRISDOL) 1.25 MG (50000 UNIT) CAPS capsule TAKE 1 CAPSULE BY MOUTH 3 DAYS A WEEK FOR VITAMIN DEFICIENCY 10/05/20   Liane Comber, NP  VOLTAREN 1 % GEL Apply 2 g topically 4 (four) times daily as needed (joint pain). 05/13/18   Vladimir Crofts, PA-C     Positive ROS: Otherwise negative  All other systems have been reviewed and were otherwise negative with the exception of those mentioned in the HPI and as above.  Physical Exam: Constitutional: Alert, well-appearing, no acute distress Ears: External ears without lesions or tenderness. Ear canals are clear bilaterally.  Both TMs are clear bilaterally.  On hearing screening with the 1024 tuning fork she has a mild upper frequency sensorineural hearing loss in both ears. Nasal: External nose without lesions. Septum  relatively midline.  Mild rhinitis.  After decongesting the nose both middle meatus regions were clear with no evidence of mucopurulent discharge.  No polyps minimal edema.. Clear nasal passages bilaterally. Oral: Lips and gums without lesions. Tongue and palate mucosa without lesions. Posterior oropharynx clear.  No significant postnasal drainage noted on oropharyngeal exam. Neck: No palpable adenopathy or masses Respiratory: Breathing comfortably  Skin: No facial/neck lesions or rash noted.  Procedures  Assessment: Chronic allergic rhinitis. Mild sensorineural hearing loss in both ears.  Plan: Reviewed with her concerning regular use of Flonase 2 sprays each nostril at night as well as use of saline irrigation to help with postnasal drainage. Would continue use of Xyzal as this is a good antihistamine to use. No clinical evidence of active infection. Discussed with her concerning obtaining audiologic testing if she is having much trouble with her hearing but the only treatment option for her hearing would be hearing aids.  Radene Journey, MD

## 2021-03-23 ENCOUNTER — Other Ambulatory Visit: Payer: Self-pay

## 2021-03-23 ENCOUNTER — Ambulatory Visit: Payer: 59 | Admitting: Internal Medicine

## 2021-03-23 VITALS — BP 138/88 | HR 66 | Temp 97.6°F | Resp 17 | Ht 65.0 in | Wt 237.8 lb

## 2021-03-23 DIAGNOSIS — R7309 Other abnormal glucose: Secondary | ICD-10-CM | POA: Diagnosis not present

## 2021-03-23 DIAGNOSIS — Z6839 Body mass index (BMI) 39.0-39.9, adult: Secondary | ICD-10-CM

## 2021-03-23 DIAGNOSIS — Z79899 Other long term (current) drug therapy: Secondary | ICD-10-CM

## 2021-03-23 DIAGNOSIS — E782 Mixed hyperlipidemia: Secondary | ICD-10-CM

## 2021-03-23 DIAGNOSIS — I10 Essential (primary) hypertension: Secondary | ICD-10-CM | POA: Diagnosis not present

## 2021-03-23 DIAGNOSIS — E559 Vitamin D deficiency, unspecified: Secondary | ICD-10-CM

## 2021-03-23 DIAGNOSIS — E039 Hypothyroidism, unspecified: Secondary | ICD-10-CM

## 2021-03-23 MED ORDER — TOPIRAMATE 50 MG PO TABS: ORAL_TABLET | ORAL | 1 refills | Status: DC

## 2021-03-23 MED ORDER — PHENTERMINE HCL 37.5 MG PO TABS: ORAL_TABLET | ORAL | 1 refills | Status: DC

## 2021-03-23 NOTE — Progress Notes (Signed)
Future Appointments  Date Time Provider [place]  03/23/2021   -  3 mo   4:00 PM Unk Pinto, MD [name] None  04/18/2021  9:00 AM [facility] PAT 1 [facility] None  09/28/2021  -  CPE  10:00 AM Liane Comber, NP [name] None    History of Present Illness:                This very nice 60 y.o. DWF presents for 3 month follow up with labile  HTN, HLD, Pre-Diabetes and Vitamin D Deficiency.   In 2018,  patient was dx'd with Rheumatoid arthritis by Dr Amil Amen.        Patient is treated for HTN  (2010) & BP has been controlled at home. Today's BP is at goal  -138/88.   In 2016,  patient had an ablation for pSVT by Dr Johnsie Cancel & Lovena Le.  Patient has had no complaints of any cardiac type chest pain, palpitations, dyspnea / orthopnea / PND, dizziness, claudication, or dependent edema.       Hyperlipidemia is controlled with diet & meds. Patient denies myalgias or other med SE's. Last Lipids were at goal except elevated Trig's:  Lab Results  Component Value Date   CHOL 176 09/28/2020   HDL 50 09/28/2020   LDLCALC 96 09/28/2020   TRIG 201 (H) 09/28/2020   CHOLHDL 3.5 09/28/2020     Also, the patient has Morbid Obesity  (BMI 39.57) and history of PreDiabetes  and has had no symptoms of reactive hypoglycemia, diabetic polys, paresthesias or visual blurring.  Last A1c was not at goal:  Lab Results  Component Value Date   HGBA1C 5.8 (H) 09/28/2020   Wt Readings from Last 3 Encounters:  03/23/21 237 lb 12.8 oz (107.9 kg)  09/28/20 225 lb (102.1 kg)  04/06/20 217 lb (98.4 kg)                                                          In 2003, Patient was initiated  on Thyroid Replacement. Further, the patient also has history of Vitamin D Deficiency  ("31" /Feb 2022) and supplements vitamin D without any suspected side-effects. Current vitamin D is still very low :  Last vitamin D Lab Results  Component Value Date   VD25OH 48 03/23/2021     Current Outpatient  Medications on File Prior to Visit  Medication Sig   Abatacept (ORENCIA IV) Inject 7.5 mLs into the skin every 30 days.   acetaminophen  650 MG CR tablet Take  3 times daily as needed for pain.   Albuterol HFA  inhaler Inhale 2 puffs  every 4 hours as needed    bisoprolol-hctz 5-6.25 MG tablet TAKE 1 TABLET  EVERY DAY    BREZTRI 160-9-4.8  Inhale 1 Pump in the morning and at bedtime.   busPIRone 10 MG tablet TAKE 1 TABLET THREE TIMES A DAY   VITAMIN B-12  1000 MCG/ML injec Inject 1078mg once a month   escitalopram  20 MG tablet TAKE 1 TABLET EVERY DAY   folic acid 1 MG tablet Take  daily.   levothyroxine (137 MCG tablet TAKE 1 TABLET DAILY    meclizine  25 MG tablet Take 1 tablet  3 (times daily as needed  methotrexate 50 MG/2ML injection Inject 2 mLs into the muscle once a week.    olmesartan 40 MG tablet Take  1 tablet  Daily for BP   pantoprazole 20 MG tablet TAKE 1 TABLET DAILY    triamcinolone cream 0.5 % Apply 2  times daily.   Vitamin D 50,000 u  TAKE 1 CAPSULE 3 DAYS A WEEK    VOLTAREN 1 % GEL Apply 2 g   4 times daily as needed       Allergies  Allergen Reactions   Darvon [Propoxyphene Hcl] Anaphylaxis and Other (See Comments)    Airway swelling    Propoxyphene Anaphylaxis    Throat closes up   Doxycycline Nausea Only   Tamiflu [Oseltamivir Phosphate] Hives and Other (See Comments)    Increased blood pressure     PMHx:   Past Medical History:  Diagnosis Date   Allergy    Anemia    Arthritis    "knees, hands, ankles, ~ every joint" (04/08/2015)   Basal cell carcinoma    "face, hands, chest" (04/08/2015)   Chronic bronchitis ([place])    "get it pretty much q yr" (04/08/2015)   Dysrhythmia    palpitations occ; SVT 09/2013 s/p adenosine   GERD (gastroesophageal reflux disease)    H/O hiatal hernia    Hx of cardiovascular stress test    Lexiscan Myoview 6/16: EF is 60% & wall motion is normal. The study is normal.    Hyperlipidemia    Hypertension     Hypothyroidism    Palpitations    Pneumonia 2015 X 1   PONV (postoperative nausea and vomiting)    Pre-diabetes    S/P total knee arthroplasty 02/15/2014   Sleep apnea    Thyroid disease    Vitamin D deficiency      Immunization History  Administered Date(s) Administered   Influenza,inj,quad 05/14/2019   Influenza-Unspecified 05/14/2015, 05/27/2017, 05/13/2018   Meningococcal B, OMV 06/19/2019   Meningococcal Mcv4o 06/19/2019   PFIZER SARS-COV-2 Vacc 10/23/2019, 11/18/2019, 06/02/2020   PPD Test 12/02/2013   Pneumococcal -13 09/01/2019   Pneumococcal 23 04/08/2012, 04/06/2020   Td 10/16/2004   Tdap 07/24/2017     Past Surgical History:  Procedure Laterality Date   BASAL CELL CARCINOMA EXCISION  2014   "off my chest"   [place]   "lasered the holes shut"   CARDIAC CATHETERIZATION  2017   CARDIAC SURGERY     CARPAL TUNNEL RELEASE Right 1990   COLONOSCOPY     COLONOSCOPY WITH PROPOFOL N/A 11/25/2017   Procedure: COLONOSCOPY WITH PROPOFOL;  Surgeon: Mauri Pole, MD;  [facility];  Service: Endoscopy;  Laterality: N/A;   ELECTROPHYSIOLOGIC STUDY N/A 04/08/2015   Procedure: SVT Ablation;  Surgeon: Evans Lance, MD;  Location: [facility];  Service: Cardiovascular;  Laterality: N/A;   ENDOMETRIAL ABLATION  2009   INCONTINENCE SURGERY  2010   JOINT REPLACEMENT     KNEE ARTHROSCOPY Right 2013   KNEE ARTHROSCOPY     LAPAROSCOPIC GASTRIC SLEEVE RESECTION N/A 05/26/2019   Procedure: LAPAROSCOPIC GASTRIC SLEEVE RESECTION, Upper Endo, ERAS Pathway;  Surgeon: Greer Pickerel, MD;  [facility];  Service: General;  Laterality: N/A;   PARTIAL KNEE ARTHROPLASTY Right 02/15/2014   Procedure: RIGHT UNICOMPARTMENTAL KNEE (MEDIAL COMPARTMENT);  Surgeon: Vickey Huger, MD;  Location: [facility];  Service: Orthopedics;  Laterality: Right;   POLYPECTOMY     SPLENECTOMY, TOTAL  05/26/2019   Procedure: SPLENECTOMY;  Surgeon:  Greer Pickerel, MD;  [facility];   Service: General;;   TUBAL LIGATION  1987    FHx:    Reviewed / unchanged  SHx:    Reviewed / unchanged   Systems Review:  Constitutional: Denies fever, chills, wt changes, headaches, insomnia, fatigue, night sweats, change in appetite. Eyes: Denies redness, blurred vision, diplopia, discharge, itchy, watery eyes.  ENT: Denies discharge, congestion, post nasal drip, epistaxis, sore throat, earache, hearing loss, dental pain, tinnitus, vertigo, sinus pain, snoring.  CV: Denies chest pain, palpitations, irregular heartbeat, syncope, dyspnea, diaphoresis, orthopnea, PND, claudication or edema. Respiratory: denies cough, dyspnea, DOE, pleurisy, hoarseness, laryngitis, wheezing.  Gastrointestinal: Denies dysphagia, odynophagia, heartburn, reflux, water brash, abdominal pain or cramps, nausea, vomiting, bloating, diarrhea, constipation, hematemesis, melena, hematochezia  or hemorrhoids. Genitourinary: Denies dysuria, frequency, urgency, nocturia, hesitancy, discharge, hematuria or flank pain. Musculoskeletal: Denies arthralgias, myalgias, stiffness, jt. swelling, pain, limping or strain/sprain.  Skin: Denies pruritus, rash, hives, warts, acne, eczema or change in skin lesion(s). Neuro: No weakness, tremor, incoordination, spasms, paresthesia or pain. Psychiatric: Denies confusion, memory loss or sensory loss. Endo: Denies change in weight, skin or hair change.  Heme/Lymph: No excessive bleeding, bruising or enlarged lymph nodes.  Physical Exam  BP 138/88   Pulse 66   Temp 97.6 F (36.4 C)   Resp 17   Ht '5\' 5"'$  (1.651 m)   Wt 237 lb 12.8 oz (107.9 kg)   SpO2 95%   BMI 39.57 kg/m   Appears  well nourished, well groomed  and in no distress.  Eyes: PERRLA, EOMs, conjunctiva no swelling or erythema. Sinuses: No frontal/maxillary tenderness ENT/Mouth: EAC's clear, TM's nl w/o erythema, bulging. Nares clear w/o erythema, swelling, exudates. Oropharynx clear without erythema or exudates.  Oral hygiene is good. Tongue normal, non obstructing. Hearing intact.  Neck: Supple. Thyroid not palpable. Car 2+/2+ without bruits, nodes or JVD. Chest: Respirations nl with BS clear & equal w/o rales, rhonchi, wheezing or stridor.  Cor: Heart sounds normal w/ regular rate and rhythm without sig. murmurs, gallops, clicks or rubs. Peripheral pulses normal and equal  without edema.  Abdomen: Soft & bowel sounds normal. Non-tender w/o guarding, rebound, hernias, masses or organomegaly.  Lymphatics: Unremarkable.  Musculoskeletal: Full ROM all peripheral extremities, joint stability, 5/5 strength and normal gait.  Skin: Warm, dry without exposed rashes, lesions or ecchymosis apparent.  Neuro: Cranial nerves intact, reflexes equal bilaterally. Sensory-motor testing grossly intact. Tendon reflexes grossly intact.  Pysch: Alert & oriented x 3.  Insight and judgement nl & appropriate. No ideations.  Assessment and Plan:  1. Class 2 severe obesity with serious comorbidity and body mass  index (BMI) of 39.0 to 39.9 in adult, un-specified obesity type ([place])  2. Essential hypertension  - Continue medication, monitor blood pressure at home.  - Continue DASH diet.  Reminder to go to the ER if any CP,  SOB, nausea, dizziness, severe HA, changes vision/speech.    - CBC with Differential/Platelet - COMPLETE METABOLIC PANEL WITH GFR - Magnesium - TSH  3. Hyperlipidemia, mixed  - Continue diet/meds, exercise,& lifestyle modifications.  - Continue monitor periodic cholesterol/liver & renal functions    - Continue diet, exercise  - Lifestyle modifications.  - Monitor appropriate labs   - Lipid panel - TSH  4. Abnormal glucose  - Continue supplementation.    - Hemoglobin A1c - Insulin, random  5. Vitamin D deficiency  - VITAMIN D 25 Hydroxy   6. Hypothyroidism  - TSH  7. Medication  management  - CBC with Differential/Platelet - COMPLETE METABOLIC PANEL WITH GFR - Magnesium -  Lipid panel - TSH - Hemoglobin A1c - Insulin, random - VITAMIN D 25 Hydroxy          Discussed  regular exercise, BP monitoring, weight control to achieve/maintain BMI less than 25 and discussed med and SE's. Recommended labs to assess and monitor clinical status with further disposition pending results of labs.  I discussed the assessment and treatment plan with the patient. The patient was provided an opportunity to ask questions and all were answered. The patient agreed with the plan and demonstrated an understanding of the instructions.  I provided over 30 minutes of exam, counseling, chart review and  complex critical decision making.        The patient was advised to call back or seek an in-person evaluation if the symptoms worsen or if the condition fails to improve as anticipated.   Kirtland Bouchard, MD

## 2021-03-23 NOTE — Patient Instructions (Signed)
Due to recent changes in healthcare laws, you may see the results of your imaging and laboratory studies on MyChart before your provider has had a chance to review them.  We understand that in some cases there may be results that are confusing or concerning to you. Not all laboratory results come back in the same time frame and the provider may be waiting for multiple results in order to interpret others.  Please give us 48 hours in order for your provider to thoroughly review all the results before contacting the office for clarification of your results.  °++++++++++++++++++++++++++++++++++ ° Vit D  & °Vit C 1,000 mg   °are recommended to help protect  °against the Covid-19 and other Corona viruses.  ° ° Also it's recommended  °to take  °Zinc 50 mg  °to help  °protect against the Covid-19   °and best place to get ° is also on [employer]  °and don't pay more than 6-8 cents /pill !  ° °===================================== °Coronavirus (COVID-19) Are you at risk? ° °Are you at risk for the Coronavirus (COVID-19)? ° °To be considered HIGH RISK for Coronavirus (COVID-19), you have to meet the following criteria: ° °Traveled to [place], [place], [place], [place] or [place]; or in the [place] to [place], [place], [place]  °or [place]; and have fever, cough, and shortness of breath within the last 2 weeks of travel OR °Been in close contact with a person diagnosed with COVID-19 within the last 2 weeks and have  °fever, cough,and shortness of breath ° °IF YOU DO NOT MEET THESE CRITERIA, YOU ARE CONSIDERED LOW RISK FOR COVID-19. ° °What to do if you are HIGH RISK for COVID-19? ° °If you are having a medical emergency, call 911. °Seek medical care right away. Before you go to a doctor’s office, urgent care or emergency department, ° call ahead and tell them about your recent travel, contact with someone diagnosed with COVID-19  ° and your symptoms.  °You should receive instructions from your physician’s office  regarding next steps of care.  °When you arrive at healthcare provider, tell the healthcare staff immediately you have returned from  °visiting [place], [place], [place], [place] or [place]; or traveled in the [place] to [place], [place],  °[place] or [place] in the last two weeks or you have been in close contact with a person diagnosed with  °COVID-19 in the last 2 weeks.   °Tell the health care staff about your symptoms: fever, cough and shortness of breath. °After you have been seen by a medical provider, you will be either: °Tested for (COVID-19) and discharged home on quarantine except to seek medical care if  °symptoms worsen, and asked to  °Stay home and avoid contact with others until you get your results (4-5 days)  °Avoid travel on public transportation if possible (such as bus, train, or airplane) or °Sent to the Emergency Department by EMS for evaluation, COVID-19 testing  and  °possible admission depending on your condition and test results. ° °What to do if you are LOW RISK for COVID-19? ° °Reduce your risk of any infection by using the same precautions used for avoiding the common cold or flu:  °Wash your hands often with soap and warm water for at least 20 seconds.  If soap and water are not readily available,  °use an alcohol-based hand sanitizer with at least 60% alcohol.  °If coughing or sneezing, cover your mouth and nose by coughing   or sneezing into the elbow areas of your shirt or coat, ° into a tissue or into your sleeve (not your hands). °Avoid shaking hands with others and consider head nods or verbal greetings only. °Avoid touching your eyes, nose, or mouth with unwashed hands.  °Avoid close contact with people who are sick. °Avoid places or events with large numbers of people in one location, like concerts or sporting events. °Carefully consider travel plans you have or are making. °If you are planning any travel outside or inside the [place], visit the CDC’s Travelers’ Health  webpage for the latest health notices. °If you have some symptoms but not all symptoms, continue to monitor at home and seek medical attention  °if your symptoms worsen. °If you are having a medical emergency, call 911. ° ° °++++++++++++++++++++++++++++++++ °Recommend Adult Low Dose Aspirin or  °coated  Aspirin 81 mg daily  °To reduce risk of Colon Cancer 40 %, ° Skin Cancer 26 % ,  °Melanoma 46% ° and  °Pancreatic cancer 60% °++++++++++++++++++++++++++++++++ °Vitamin D goal ° is between 70-100.  °Please make sure that you are taking your Vitamin D as directed.  °It is very important as a natural anti-inflammatory  °helping hair, skin, and nails, as well as reducing stroke and heart attack risk.  °It helps your bones and helps with mood. °It also decreases numerous cancer risks so please take it as directed.  °Low Vit D is associated with a 200-300% higher risk for CANCER  °and 200-300% higher risk for HEART   ATTACK  &  STROKE.   °...................................... °It is also associated with higher death rate at younger ages,  °autoimmune diseases like Rheumatoid arthritis, Lupus, Multiple Sclerosis.    °Also many other serious conditions, like depression, Alzheimer's °Dementia, infertility, muscle aches, fatigue, fibromyalgia - just to name a few. °++++++++++++++++++++ °Recommend the book "The END of DIETING" by Dr Joel Fuhrman  °& the book "The END of DIABETES " by Dr Joel Fuhrman °At [employer].com - get book & Audio CD's  °  Being diabetic has a  300% increased risk for heart attack, stroke, cancer, and alzheimer- type vascular dementia. It is very important that you work harder with diet by avoiding all foods that are white. Avoid white rice (brown & wild rice is OK), white potatoes (sweetpotatoes in moderation is OK), White bread or wheat bread or anything made out of white flour like bagels, donuts, rolls, buns, biscuits, cakes, pastries, cookies, pizza crust, and pasta (made from white flour & egg whites)  - vegetarian pasta or spinach or wheat pasta is OK. Multigrain breads like Arnold's or Pepper[employer], or multigrain sandwich thins or flatbreads.  Diet, exercise and weight loss can reverse and cure diabetes in the early stages.  Diet, exercise and weight loss is very important in the control and prevention of complications of diabetes which affects every system in your body, ie. Brain - dementia/stroke, eyes - glaucoma/blindness, heart - heart attack/heart failure, kidneys - dialysis, stomach - gastric paralysis, intestines - malabsorption, nerves - severe painful neuritis, circulation - gangrene & loss of a leg(s), and finally cancer and Alzheimers. ° °  I recommend avoid fried & greasy foods,  sweets/candy, white rice (brown or wild rice or Quinoa is OK), white potatoes (sweet potatoes are OK) - anything made from white flour - bagels, doughnuts, rolls, buns, biscuits,white and wheat breads, pizza crust and traditional pasta made of white flour & egg white(vegetarian pasta or spinach or wheat pasta is OK).    Multi-grain bread is OK - like multi-grain flat bread or sandwich thins. Avoid alcohol in excess. Exercise is also important. ° °  Eat all the vegetables you want - avoid meat, especially red meat and dairy - especially cheese.  Cheese is the most concentrated form of trans-fats which is the worst thing to clog up our arteries. Veggie cheese is OK which can be found in the fresh produce section at [employer] or [employer] or [employer] ° °+++++++++++++++++++++ °DASH Eating Plan ° °DASH stands for "Dietary Approaches to Stop Hypertension."  ° °The DASH eating plan is a healthy eating plan that has been shown to reduce high blood pressure (hypertension). Additional health benefits may include reducing the risk of type 2 diabetes mellitus, heart disease, and stroke. The DASH eating plan may also help with weight loss. °WHAT DO I NEED TO KNOW ABOUT THE DASH EATING PLAN? °For the DASH eating plan, you will  follow these general guidelines: °Choose foods with a percent daily value for sodium of less than 5% (as listed on the food label). °Use salt-free seasonings or herbs instead of table salt or sea salt. °Check with your health care provider or pharmacist before using salt substitutes. °Eat lower-sodium products, often labeled as "lower sodium" or "no salt added." °Eat fresh foods. °Eat more vegetables, fruits, and low-fat dairy products. °Choose whole grains. Look for the word "whole" as the first word in the ingredient list. °Choose fish  °Limit sweets, desserts, sugars, and sugary drinks. °Choose heart-healthy fats. °Eat veggie cheese  °Eat more home-cooked food and less restaurant, buffet, and fast food. °Limit fried foods. °Verde foods using methods other than frying. °Limit canned vegetables. If you do use them, rinse them well to decrease the sodium. °When eating at a restaurant, ask that your food be prepared with less salt, or no salt if possible. °                  °   WHAT FOODS CAN I EAT? °Read Dr Joel Fuhrman's books on The End of Dieting & The End of Diabetes ° °Grains °Whole grain or whole wheat bread. Brown rice. Whole grain or whole wheat pasta. Quinoa, bulgur, and whole grain cereals. Low-sodium cereals. Corn or whole wheat flour tortillas. Whole grain cornbread. Whole grain crackers. Low-sodium crackers. ° °Vegetables °Fresh or frozen vegetables (raw, steamed, roasted, or grilled). Low-sodium or reduced-sodium tomato and vegetable juices. Low-sodium or reduced-sodium tomato sauce and paste. Low-sodium or reduced-sodium canned vegetables.  ° °Fruits °All fresh, canned (in natural juice), or frozen fruits. ° °Protein Products ° All fish and seafood.  Dried beans, peas, or lentils. Unsalted nuts and seeds. Unsalted canned beans. ° °Dairy °Low-fat dairy products, such as skim or 1% milk, 2% or reduced-fat cheeses, low-fat ricotta or cottage cheese, or plain low-fat yogurt. Low-sodium or reduced-sodium  cheeses. ° °Fats and Oils °Tub margarines without trans fats. Light or reduced-fat mayonnaise and salad dressings (reduced sodium). Avocado. Safflower, olive, or canola oils. Natural peanut or almond butter. ° °Other °Unsalted popcorn and pretzels. °The items listed above may not be a complete list of recommended foods or beverages. Contact your dietitian for more options. ° °+++++++++++++++ ° °WHAT FOODS ARE NOT RECOMMENDED? °Grains/ White flour or wheat flour °White bread. White pasta. White rice. Refined cornbread. Bagels and croissants. Crackers that contain trans fat. ° °Vegetables ° °Creamed or fried vegetables. Vegetables in a . Regular canned vegetables. Regular canned tomato sauce and paste. Regular tomato and vegetable juices. ° °  Fruits °Dried fruits. Canned fruit in light or heavy syrup. Fruit juice. ° °Meat and Other Protein Products °Meat in general - RED meat & White meat.  Fatty cuts of meat. Ribs, chicken wings, all processed meats as bacon, sausage, bologna, salami, fatback, hot dogs, bratwurst and packaged luncheon meats. ° °Dairy °Whole or 2% milk, cream, half-and-half, and cream cheese. Whole-fat or sweetened yogurt. Full-fat cheeses or blue cheese. Non-dairy creamers and whipped toppings. Processed cheese, cheese spreads, or cheese curds. ° °Condiments °Onion and garlic salt, seasoned salt, table salt, and sea salt. Canned and packaged gravies. Worcestershire sauce. Tartar sauce. Barbecue sauce. Teriyaki sauce. Soy sauce, including reduced sodium. Steak sauce. Fish sauce. Oyster sauce. Cocktail sauce. Horseradish. Ketchup and mustard. Meat flavorings and tenderizers. Bouillon cubes. Hot sauce. Tabasco sauce. Marinades. Taco seasonings. Relishes. ° °Fats and Oils °Butter, stick margarine, lard, shortening and bacon fat. Coconut, palm kernel, or palm oils. Regular salad dressings. ° °Pickles and olives. Salted popcorn and pretzels. ° °The items listed above may not be a complete list of foods and  beverages to avoid. ° °

## 2021-03-24 LAB — CBC WITH DIFFERENTIAL/PLATELET
Absolute Monocytes: 905 cells/uL (ref 200–950)
Basophils Absolute: 42 cells/uL (ref 0–200)
Basophils Relative: 0.5 %
Eosinophils Absolute: 133 cells/uL (ref 15–500)
Eosinophils Relative: 1.6 %
HCT: 35.1 % (ref 35.0–45.0)
Hemoglobin: 11.9 g/dL (ref 11.7–15.5)
Lymphs Abs: 2714 cells/uL (ref 850–3900)
MCH: 33.3 pg — ABNORMAL HIGH (ref 27.0–33.0)
MCHC: 33.9 g/dL (ref 32.0–36.0)
MCV: 98.3 fL (ref 80.0–100.0)
MPV: 11.5 fL (ref 7.5–12.5)
Monocytes Relative: 10.9 %
Neutro Abs: 4507 cells/uL (ref 1500–7800)
Neutrophils Relative %: 54.3 %
Platelets: 297 10*3/uL (ref 140–400)
RBC: 3.57 10*6/uL — ABNORMAL LOW (ref 3.80–5.10)
RDW: 12.2 % (ref 11.0–15.0)
Total Lymphocyte: 32.7 %
WBC: 8.3 10*3/uL (ref 3.8–10.8)

## 2021-03-24 LAB — MAGNESIUM: Magnesium: 2.3 mg/dL (ref 1.5–2.5)

## 2021-03-24 LAB — COMPLETE METABOLIC PANEL WITH GFR
AG Ratio: 1.6 (calc) (ref 1.0–2.5)
ALT: 10 U/L (ref 6–29)
AST: 14 U/L (ref 10–35)
Albumin: 3.9 g/dL (ref 3.6–5.1)
Alkaline phosphatase (APISO): 96 U/L (ref 37–153)
BUN: 18 mg/dL (ref 7–25)
CO2: 30 mmol/L (ref 20–32)
Calcium: 9.3 mg/dL (ref 8.6–10.4)
Chloride: 104 mmol/L (ref 98–110)
Creat: 0.83 mg/dL (ref 0.50–1.03)
Globulin: 2.5 g/dL (calc) (ref 1.9–3.7)
Glucose, Bld: 86 mg/dL (ref 65–99)
Potassium: 4 mmol/L (ref 3.5–5.3)
Sodium: 139 mmol/L (ref 135–146)
Total Bilirubin: 0.2 mg/dL (ref 0.2–1.2)
Total Protein: 6.4 g/dL (ref 6.1–8.1)
eGFR: 82 mL/min/{1.73_m2} (ref >=60)

## 2021-03-24 LAB — VITAMIN D 25 HYDROXY (VIT D DEFICIENCY, FRACTURES): Vit D, 25-Hydroxy: 48 ng/mL (ref 30–100)

## 2021-03-24 LAB — HEMOGLOBIN A1C
Hgb A1c MFr Bld: 5.9 % of total Hgb — ABNORMAL HIGH (ref <5.7)
Mean Plasma Glucose: 123 mg/dL
eAG (mmol/L): 6.8 mmol/L

## 2021-03-24 LAB — LIPID PANEL
Cholesterol: 179 mg/dL (ref <200)
HDL: 45 mg/dL — ABNORMAL LOW (ref >=50)
LDL Cholesterol (Calc): 105 mg/dL (calc) — ABNORMAL HIGH
Non-HDL Cholesterol (Calc): 134 mg/dL (calc) — ABNORMAL HIGH (ref <130)
Total CHOL/HDL Ratio: 4 (calc) (ref <5.0)
Triglycerides: 175 mg/dL — ABNORMAL HIGH (ref <150)

## 2021-03-24 LAB — TSH: TSH: 1.09 mIU/L (ref 0.40–4.50)

## 2021-03-24 LAB — INSULIN, RANDOM: Insulin: 8.5 u[IU]/mL

## 2021-03-25 NOTE — Progress Notes (Signed)
============================================================ - Test results slightly outside the reference range are not unusual. If there is anything important, I will review this with you,  otherwise it is considered normal test values.  If you have further questions,  please do not hesitate to contact me at the office or via My Chart.  ============================================================ ============================================================  -  Total Chol = 179  - Great,   -  but the Bad / Dangerous LDL Chol = 105  - is elevated, So           (  Ideal or Goal is less than 70  !  )   - Recommend a stricter low cholesterol diet to avoid having to start more meds   - Cholesterol only comes from animal sources  - ie. meat, dairy, egg yolks  - Eat all the vegetables you want.  - Avoid meat, especially red meat - Beef AND Pork .  - Avoid cheese & dairy - milk & ice cream.     - Cheese is the most concentrated form of trans-fats which  is the worst thing to clog up our arteries.   - Veggie cheese is OK which can be found in the fresh  produce section at [employer] or [employer] or [employer] ============================================================ ============================================================  -   A1c = 5.9% - Blood sugar and A1c are  STILL elevated in the borderline and  early or pre-diabetes range which has the same   300% increased risk for heart attack, stroke, cancer and   alzheimer- type vascular dementia as full blown diabetes.   But the good news is that diet, exercise with  weight loss can cure the early diabetes at this point. ============================================================ ============================================================  - Vitamin D = 48  - low   - Vitamin D goal is between  70-100 ~~~~~~~~~~~~~~~~~~~~~~~~~~~~~~~~~~~~~~~~~~~~~~~~~~~~~~~~~~~~ ooooooooooooooooooooooooooooooooooooooooooooooooooooooooooooooo ~~~~~~~~~~~~~~~~~~~~~~~~~~~~~~~~~~~~~~~~~~~~~~~~~~~~~~~~~~~~  - Recommend that you Stop the Vitamin D 50,000 unit capsules 3 x /week  - And Switch to OTC Vitamin D 5,000 unit capsules &                                                             take 2 capsules (=10,000 units) every day ~~~~~~~~~~~~~~~~~~~~~~~~~~~~~~~~~~~~~~~~~~~~~~~~~~~~~~~~~~~~ ooooooooooooooooooooooooooooooooooooooooooooooooooooooooooooooo ~~~~~~~~~~~~~~~~~~~~~~~~~~~~~~~~~~~~~~~~~~~~~~~~~~~~~~~~~~~~  - It is very important as a natural anti-inflammatory and helping the  immune system protect against viral infections, like the Covid-19   helping hair, skin, and nails, as well as reducing stroke and  heart attack risk.   - It helps your bones and helps with mood.  - It also decreases numerous cancer risks so please  take it as directed.   - Low Vit D is associated with a 200-300% higher risk for  CANCER   and 200-300% higher risk for HEART   ATTACK  &  STROKE.    - It is also associated with higher death rate at younger ages,   autoimmune diseases like Rheumatoid arthritis, Lupus,  Multiple Sclerosis.     - Also many other serious conditions, like depression, Alzheimer's  Dementia, infertility, muscle aches, fatigue, fibromyalgia   - just to name a few. ==========================================================  - All Else - CBC - Kidneys - Electrolytes - Liver - Magnesium & Thyroid    - all  Normal / OK ============================================================ ============================================================  -

## 2021-03-26 ENCOUNTER — Encounter: Payer: Self-pay | Admitting: Internal Medicine

## 2021-04-07 ENCOUNTER — Other Ambulatory Visit: Payer: Self-pay | Admitting: Adult Health Nurse Practitioner

## 2021-04-12 NOTE — Progress Notes (Signed)
DUE TO COVID-19 ONLY ONE VISITOR IS ALLOWED TO COME WITH YOU AND STAY IN THE WAITING ROOM ONLY DURING PRE OP AND PROCEDURE DAY OF SURGERY.  2 VISITOR  MAY VISIT WITH YOU AFTER SURGERY IN YOUR PRIVATE ROOM DURING VISITING HOURS ONLY!  YOU NEED TO HAVE A COVID 19 TEST ON______AME'@_'$  '@_from'$  8am-3pm _____, THIS TEST MUST BE DONE BEFORE SURGERY,  Covid test is done at [place], [place] [address].  This is a drive thru.  No appt required. Please see map.                 Your procedure is scheduled on:  04/27/2021   Report to [facility] Main  Entrance   Report to admitting at    [phone]     Call this number if you have problems the morning of surgery [phone]    REMEMBER: NO  SOLID FOOD CANDY OR GUM AFTER MIDNIGHT. CLEAR LIQUIDS UNTIL   0430am       . NOTHING BY MOUTH EXCEPT CLEAR LIQUIDS UNTIL     0430 am   . PLEASE FINISH ENSURE DRINK PER SURGEON ORDER  WHICH NEEDS TO BE COMPLETED AT    0430am   .      CLEAR LIQUID DIET   Foods Allowed                                                                    Coffee and tea, regular and decaf                            Fruit ices (not with fruit pulp)                                      Iced Popsicles                                    Carbonated beverages, regular and diet                                    Cranberry, grape and apple juices Sports drinks like Gatorade Lightly seasoned clear broth or consume(fat free) Sugar, honey syrup ___________________________________________________________________      BRUSH YOUR TEETH MORNING OF SURGERY AND RINSE YOUR MOUTH OUT, NO CHEWING GUM CANDY OR MINTS.     Take these medicines the morning of surgery with A SIP OF WATER: nhalers as usual and bring, lexapro, synthroid, topamax   DO NOT TAKE ANY DIABETIC MEDICATIONS DAY OF YOUR SURGERY                               You may not have any metal on your body including hair pins and              piercings  Do not  wear jewelry, make-up, lotions, powders or perfumes, deodorant  Do not wear nail polish on your fingernails.  Do not shave  48 hours prior to surgery.              Men may shave face and neck.   Do not bring valuables to the hospital. [place].  Contacts, dentures or bridgework may not be worn into surgery.  Leave suitcase in the car. After surgery it may be brought to your room.     Patients discharged the day of surgery will not be allowed to drive home. IF YOU ARE HAVING SURGERY AND GOING HOME THE SAME DAY, YOU MUST HAVE AN ADULT TO DRIVE YOU HOME AND BE WITH YOU FOR 24 HOURS. YOU MAY GO HOME BY TAXI OR UBER OR ORTHERWISE, BUT AN ADULT MUST ACCOMPANY YOU HOME AND STAY WITH YOU FOR 24 HOURS.  Name and phone number of your driver:  Special Instructions: N/A              Please read over the following fact sheets you were given: _____________________________________________________________________  [facility] - Preparing for Surgery Before surgery, you can play an important role.  Because skin is not sterile, your skin needs to be as free of germs as possible.  You can reduce the number of germs on your skin by washing with CHG (chlorahexidine gluconate) soap before surgery.  CHG is an antiseptic cleaner which kills germs and bonds with the skin to continue killing germs even after washing. Please DO NOT use if you have an allergy to CHG or antibacterial soaps.  If your skin becomes reddened/irritated stop using the CHG and inform your nurse when you arrive at Short Stay. Do not shave (including legs and underarms) for at least 48 hours prior to the first CHG shower.  You may shave your face/neck. Please follow these instructions carefully:  1.  Shower with CHG Soap the night before surgery and the  morning of Surgery.  2.  If you choose to wash your hair, wash your hair first as usual with your  normal  shampoo.  3.  After you  shampoo, rinse your hair and body thoroughly to remove the  shampoo.                           4.  Use CHG as you would any other liquid soap.  You can apply chg directly  to the skin and wash                       Gently with a scrungie or clean washcloth.  5.  Apply the CHG Soap to your body ONLY FROM THE NECK DOWN.   Do not use on face/ open                           Wound or open sores. Avoid contact with eyes, ears mouth and genitals (private parts).                       Wash face,  Genitals (private parts) with your normal soap.             6.  Wash thoroughly, paying special attention to the area where your surgery  will be performed.  7.  Thoroughly rinse your body with  warm water from the neck down.  8.  DO NOT shower/wash with your normal soap after using and rinsing off  the CHG Soap.                9.  Pat yourself dry with a clean towel.            10.  Wear clean pajamas.            11.  Place clean sheets on your bed the night of your first shower and do not  sleep with pets. Day of Surgery : Do not apply any lotions/deodorants the morning of surgery.  Please wear clean clothes to the hospital/surgery center.  FAILURE TO FOLLOW THESE INSTRUCTIONS MAY RESULT IN THE CANCELLATION OF YOUR SURGERY PATIENT SIGNATURE_________________________________  NURSE SIGNATURE__________________________________  ________________________________________________________________________              [facility]- Preparing for Total Shoulder Arthroplasty    Before surgery, you can play an important role. Because skin is not sterile, your skin needs to be as free of germs as possible. You can reduce the number of germs on your skin by using the following products. Benzoyl Peroxide Gel Reduces the number of germs present on the skin Applied twice a day to shoulder area starting two days before surgery    ==================================================================  Please follow these  instructions carefully:  BENZOYL PEROXIDE 5% GEL  Please do not use if you have an allergy to benzoyl peroxide.   If your skin becomes reddened/irritated stop using the benzoyl peroxide.  Starting two days before surgery, apply as follows: Apply benzoyl peroxide in the morning and at night. Apply after taking a shower. If you are not taking a shower clean entire shoulder front, back, and side along with the armpit with a clean wet washcloth.  Place a quarter-sized dollop on your shoulder and rub in thoroughly, making sure to cover the front, back, and side of your shoulder, along with the armpit.   2 days before ____ AM   ____ PM              1 day before ____ AM   ____ PM                         Do this twice a day for two days.  (Last application is the night before surgery, AFTER using the CHG soap as described below).  Do NOT apply benzoyl peroxide gel on the day of surgery.

## 2021-04-16 ENCOUNTER — Other Ambulatory Visit: Payer: Self-pay | Admitting: Adult Health

## 2021-04-16 ENCOUNTER — Other Ambulatory Visit: Payer: Self-pay | Admitting: Internal Medicine

## 2021-04-16 DIAGNOSIS — F419 Anxiety disorder, unspecified: Secondary | ICD-10-CM

## 2021-04-18 ENCOUNTER — Other Ambulatory Visit: Payer: Self-pay

## 2021-04-18 ENCOUNTER — Encounter (HOSPITAL_COMMUNITY): Payer: Self-pay

## 2021-04-18 ENCOUNTER — Encounter (HOSPITAL_COMMUNITY)
Admission: RE | Admit: 2021-04-18 | Discharge: 2021-04-18 | Disposition: A | Discharge status: Home / Self Care | Payer: 59 | Source: Ambulatory Visit | Attending: Orthopedic Surgery | Admitting: Orthopedic Surgery

## 2021-04-18 DIAGNOSIS — Z01812 Encounter for preprocedural laboratory examination: Secondary | ICD-10-CM | POA: Insufficient documentation

## 2021-04-18 HISTORY — DX: Chronic obstructive pulmonary disease, unspecified: J44.9

## 2021-04-18 HISTORY — DX: Anxiety disorder, unspecified: F41.9

## 2021-04-18 LAB — BASIC METABOLIC PANEL
Anion gap: 7 (ref 5–15)
BUN: 15 mg/dL (ref 6–20)
CO2: 25 mmol/L (ref 22–32)
Calcium: 9.7 mg/dL (ref 8.9–10.3)
Chloride: 106 mmol/L (ref 98–111)
Creatinine, Ser: 0.83 mg/dL (ref 0.44–1.00)
GFR, Estimated: >60 mL/min (ref >60)
Glucose, Bld: 102 mg/dL — ABNORMAL HIGH (ref 70–99)
Potassium: 3.9 mmol/L (ref 3.5–5.1)
Sodium: 138 mmol/L (ref 135–145)

## 2021-04-18 LAB — CBC
HCT: 38.4 % (ref 36.0–46.0)
Hemoglobin: 12.5 g/dL (ref 12.0–15.0)
MCH: 33.4 pg (ref 26.0–34.0)
MCHC: 32.6 g/dL (ref 30.0–36.0)
MCV: 102.7 fL — ABNORMAL HIGH (ref 80.0–100.0)
Platelets: 224 10*3/uL (ref 150–400)
RBC: 3.74 MIL/uL — ABNORMAL LOW (ref 3.87–5.11)
RDW: 12.7 % (ref 11.5–15.5)
WBC: 8.9 10*3/uL (ref 4.0–10.5)
nRBC: 0 % (ref 0.0–0.2)

## 2021-04-18 LAB — SURGICAL PCR SCREEN
MRSA, PCR: NEGATIVE
Staphylococcus aureus: NEGATIVE

## 2021-04-18 NOTE — Progress Notes (Addendum)
Anesthesia Review:  PCP: Dr Unk Pinto- [facility] 03/23/21  Cardiologist : none  Hx of SVT ablation- 2016- was seen by Dr Cristopher Peru no longer followed by him  Chest x-ray : EKG : 09/28/20  Echo : 2020  Stress test: 2020  Cardiac Cath :  Activity level: can do a flight of stairs without difficulty  Sleep Study/ CPAP :has sleep apnea but does not use cpap  Fasting Blood Sugar :      / Checks Blood Sugar -- times a day:   Blood Thinner/ Instructions /Last Dose: ASA / Instructions/ Last Dose :   Phentermine- PT was not aware she needed to stop Phentermine .  Had dose on 04/18/2021.  Instructed pt she needed to stop Phentermine prior to surgery.  PT voiced understanding.  Last dose on 04/18/21.   8/11/222- [id number]  No covid test required- ambulatory surgery  Smoker

## 2021-04-26 ENCOUNTER — Encounter (HOSPITAL_COMMUNITY): Payer: Self-pay | Admitting: Orthopedic Surgery

## 2021-04-26 NOTE — Anesthesia Preprocedure Evaluation (Addendum)
Anesthesia Evaluation  Patient identified by MRN, date of birth, ID band Patient awake    Reviewed: Allergy & Precautions, NPO status , Patient's Chart, lab work & pertinent test results  History of Anesthesia Complications (+) PONV and history of anesthetic complications  Airway Mallampati: III  TM Distance: >3 FB Neck ROM: Full    Dental  (+) Teeth Intact, Dental Advisory Given   Pulmonary sleep apnea , COPD, Current Smoker,    breath sounds clear to auscultation       Cardiovascular hypertension, Pt. on medications + dysrhythmias  Rhythm:Regular Rate:Normal     Neuro/Psych Anxiety negative neurological ROS     GI/Hepatic Neg liver ROS, hiatal hernia, GERD  Medicated,  Endo/Other  Hypothyroidism   Renal/GU negative Renal ROS     Musculoskeletal  (+) Arthritis ,   Abdominal (+) + obese,   Peds  Hematology   Anesthesia Other Findings   Reproductive/Obstetrics                            Anesthesia Physical Anesthesia Plan  ASA: 3  Anesthesia Plan: General   Post-op Pain Management:    Induction: Intravenous  PONV Risk Score and Plan: 4 or greater and Ondansetron, Dexamethasone, Midazolam and Scopolamine patch - Pre-op  Airway Management Planned: Oral ETT  Additional Equipment: None  Intra-op Plan:   Post-operative Plan: Extubation in OR  Informed Consent: I have reviewed the patients History and Physical, chart, labs and discussed the procedure including the risks, benefits and alternatives for the proposed anesthesia with the patient or authorized representative who has indicated his/her understanding and acceptance.     Dental advisory given  Plan Discussed with: CRNA  Anesthesia Plan Comments:        Anesthesia Quick Evaluation

## 2021-04-27 ENCOUNTER — Ambulatory Visit (HOSPITAL_COMMUNITY)
Admission: RE | Admit: 2021-04-27 | Discharge: 2021-04-27 | Disposition: A | Discharge status: Home / Self Care | Payer: 59 | Attending: Orthopedic Surgery | Admitting: Orthopedic Surgery

## 2021-04-27 ENCOUNTER — Ambulatory Visit (HOSPITAL_COMMUNITY): Payer: 59 | Admitting: Physician Assistant

## 2021-04-27 ENCOUNTER — Encounter (HOSPITAL_COMMUNITY): Payer: Self-pay | Admitting: Orthopedic Surgery

## 2021-04-27 ENCOUNTER — Ambulatory Visit (HOSPITAL_COMMUNITY): Payer: 59 | Admitting: Certified Registered Nurse Anesthetist

## 2021-04-27 ENCOUNTER — Encounter (HOSPITAL_COMMUNITY)
Admission: RE | Disposition: A | Discharge status: Home / Self Care | Payer: Self-pay | Source: Home / Self Care | Attending: Orthopedic Surgery

## 2021-04-27 DIAGNOSIS — M19011 Primary osteoarthritis, right shoulder: Secondary | ICD-10-CM | POA: Insufficient documentation

## 2021-04-27 DIAGNOSIS — Z79899 Other long term (current) drug therapy: Secondary | ICD-10-CM | POA: Diagnosis not present

## 2021-04-27 DIAGNOSIS — F17210 Nicotine dependence, cigarettes, uncomplicated: Secondary | ICD-10-CM | POA: Insufficient documentation

## 2021-04-27 DIAGNOSIS — Z85828 Personal history of other malignant neoplasm of skin: Secondary | ICD-10-CM | POA: Diagnosis not present

## 2021-04-27 DIAGNOSIS — M069 Rheumatoid arthritis, unspecified: Secondary | ICD-10-CM | POA: Diagnosis present

## 2021-04-27 HISTORY — PX: REVERSE SHOULDER ARTHROPLASTY: SHX5054

## 2021-04-27 SURGERY — ARTHROPLASTY, SHOULDER, TOTAL, REVERSE
Anesthesia: General | Site: Shoulder | Laterality: Right

## 2021-04-27 MED ORDER — HYDRALAZINE HCL 20 MG/ML IJ SOLN
INTRAMUSCULAR | Status: AC
  Filled 2021-04-27: qty 1

## 2021-04-27 MED ORDER — MEPERIDINE HCL 50 MG/ML IJ SOLN: 6.2500 mg | INTRAMUSCULAR | Status: DC | PRN

## 2021-04-27 MED ORDER — TRANEXAMIC ACID-NACL 1000-0.7 MG/100ML-% IV SOLN
1000.0000 mg | INTRAVENOUS | Status: AC
  Administered 2021-04-27: 1000 mg via INTRAVENOUS
  Filled 2021-04-27: qty 100

## 2021-04-27 MED ORDER — PROPOFOL 10 MG/ML IV BOLUS
INTRAVENOUS | Status: AC
  Filled 2021-04-27: qty 20

## 2021-04-27 MED ORDER — DEXAMETHASONE SODIUM PHOSPHATE 10 MG/ML IJ SOLN
INTRAMUSCULAR | Status: DC | PRN
  Administered 2021-04-27: 10 mg via INTRAVENOUS

## 2021-04-27 MED ORDER — TRANEXAMIC ACID 1000 MG/10ML IV SOLN: 1000.0000 mg | INTRAVENOUS | Status: DC

## 2021-04-27 MED ORDER — STERILE WATER FOR IRRIGATION IR SOLN
Status: DC | PRN
  Administered 2021-04-27: 1000 mL

## 2021-04-27 MED ORDER — FENTANYL CITRATE (PF) 100 MCG/2ML IJ SOLN
INTRAMUSCULAR | Status: DC | PRN
  Administered 2021-04-27: 25 ug via INTRAVENOUS
  Administered 2021-04-27: 75 ug via INTRAVENOUS

## 2021-04-27 MED ORDER — CYCLOBENZAPRINE HCL 10 MG PO TABS: 10.0000 mg | ORAL_TABLET | Freq: Three times a day (TID) | ORAL | 1 refills | Status: AC | PRN

## 2021-04-27 MED ORDER — BUPIVACAINE HCL (PF) 0.5 % IJ SOLN
INTRAMUSCULAR | Status: DC | PRN
  Administered 2021-04-27: 15 mL via PERINEURAL

## 2021-04-27 MED ORDER — FENTANYL CITRATE (PF) 100 MCG/2ML IJ SOLN
INTRAMUSCULAR | Status: AC
  Filled 2021-04-27: qty 2

## 2021-04-27 MED ORDER — ACETAMINOPHEN 325 MG PO TABS: 325.0000 mg | ORAL_TABLET | Freq: Once | ORAL | Status: DC | PRN

## 2021-04-27 MED ORDER — EPHEDRINE SULFATE-NACL 50-0.9 MG/10ML-% IV SOSY
PREFILLED_SYRINGE | INTRAVENOUS | Status: DC | PRN
  Administered 2021-04-27 (×2): 5 mg via INTRAVENOUS

## 2021-04-27 MED ORDER — LIDOCAINE HCL (PF) 2 % IJ SOLN
INTRAMUSCULAR | Status: AC
  Filled 2021-04-27: qty 5

## 2021-04-27 MED ORDER — CEFAZOLIN SODIUM-DEXTROSE 2-4 GM/100ML-% IV SOLN
2.0000 g | INTRAVENOUS | Status: AC
  Administered 2021-04-27: 2 g via INTRAVENOUS
  Filled 2021-04-27: qty 100

## 2021-04-27 MED ORDER — ACETAMINOPHEN 10 MG/ML IV SOLN: 1000.0000 mg | Freq: Once | INTRAVENOUS | Status: DC | PRN

## 2021-04-27 MED ORDER — ONDANSETRON HCL 4 MG/2ML IJ SOLN
INTRAMUSCULAR | Status: DC | PRN
  Administered 2021-04-27: 4 mg via INTRAVENOUS

## 2021-04-27 MED ORDER — CHLORHEXIDINE GLUCONATE 0.12 % MT SOLN
15.0000 mL | Freq: Once | OROMUCOSAL | Status: AC
  Administered 2021-04-27: 15 mL via OROMUCOSAL

## 2021-04-27 MED ORDER — PHENYLEPHRINE HCL-NACL 20-0.9 MG/250ML-% IV SOLN
INTRAVENOUS | Status: DC | PRN
  Administered 2021-04-27: 25 ug/min via INTRAVENOUS

## 2021-04-27 MED ORDER — GLYCOPYRROLATE PF 0.2 MG/ML IJ SOSY
PREFILLED_SYRINGE | INTRAMUSCULAR | Status: DC | PRN
  Administered 2021-04-27: .2 mg via INTRAVENOUS

## 2021-04-27 MED ORDER — PHENYLEPHRINE HCL-NACL 20-0.9 MG/250ML-% IV SOLN
INTRAVENOUS | Status: AC
  Filled 2021-04-27: qty 750

## 2021-04-27 MED ORDER — VANCOMYCIN HCL 1000 MG IV SOLR
INTRAVENOUS | Status: DC | PRN
  Administered 2021-04-27: 1000 mg via TOPICAL

## 2021-04-27 MED ORDER — LACTATED RINGERS IV SOLN: INTRAVENOUS | Status: DC

## 2021-04-27 MED ORDER — 0.9 % SODIUM CHLORIDE (POUR BTL) OPTIME
TOPICAL | Status: DC | PRN
  Administered 2021-04-27: 1000 mL

## 2021-04-27 MED ORDER — AMISULPRIDE (ANTIEMETIC) 5 MG/2ML IV SOLN: 10.0000 mg | Freq: Once | INTRAVENOUS | Status: DC | PRN

## 2021-04-27 MED ORDER — NAPROXEN 500 MG PO TABS: 500.0000 mg | ORAL_TABLET | Freq: Two times a day (BID) | ORAL | 1 refills | Status: AC

## 2021-04-27 MED ORDER — LIDOCAINE 2% (20 MG/ML) 5 ML SYRINGE
INTRAMUSCULAR | Status: DC | PRN
  Administered 2021-04-27: 20 mg via INTRAVENOUS

## 2021-04-27 MED ORDER — PROPOFOL 10 MG/ML IV BOLUS
INTRAVENOUS | Status: DC | PRN
  Administered 2021-04-27: 140 mg via INTRAVENOUS

## 2021-04-27 MED ORDER — HYDRALAZINE HCL 20 MG/ML IJ SOLN
10.0000 mg | INTRAMUSCULAR | Status: DC | PRN
  Administered 2021-04-27: 10 mg via INTRAVENOUS

## 2021-04-27 MED ORDER — BUPIVACAINE LIPOSOME 1.3 % IJ SUSP
INTRAMUSCULAR | Status: DC | PRN
  Administered 2021-04-27: 10 mL via PERINEURAL

## 2021-04-27 MED ORDER — LACTATED RINGERS IV BOLUS
500.0000 mL | Freq: Once | INTRAVENOUS | Status: AC
  Administered 2021-04-27: 500 mL via INTRAVENOUS

## 2021-04-27 MED ORDER — MIDAZOLAM HCL 5 MG/5ML IJ SOLN
INTRAMUSCULAR | Status: DC | PRN
  Administered 2021-04-27 (×2): 1 mg via INTRAVENOUS

## 2021-04-27 MED ORDER — ORAL CARE MOUTH RINSE: 15.0000 mL | Freq: Once | OROMUCOSAL | Status: AC

## 2021-04-27 MED ORDER — MIDAZOLAM HCL 2 MG/2ML IJ SOLN
INTRAMUSCULAR | Status: AC
  Filled 2021-04-27: qty 2

## 2021-04-27 MED ORDER — ACETAMINOPHEN 160 MG/5ML PO SOLN: 325.0000 mg | Freq: Once | ORAL | Status: DC | PRN

## 2021-04-27 MED ORDER — ONDANSETRON HCL 4 MG PO TABS: 4.0000 mg | ORAL_TABLET | Freq: Three times a day (TID) | ORAL | 0 refills | Status: DC | PRN

## 2021-04-27 MED ORDER — PROMETHAZINE HCL 25 MG/ML IJ SOLN: 6.2500 mg | INTRAMUSCULAR | Status: DC | PRN

## 2021-04-27 MED ORDER — SUGAMMADEX SODIUM 200 MG/2ML IV SOLN
INTRAVENOUS | Status: DC | PRN
  Administered 2021-04-27: 250 mg via INTRAVENOUS

## 2021-04-27 MED ORDER — HYDROMORPHONE HCL 1 MG/ML IJ SOLN: 0.2500 mg | INTRAMUSCULAR | Status: DC | PRN

## 2021-04-27 MED ORDER — SCOPOLAMINE 1 MG/3DAYS TD PT72
1.0000 | MEDICATED_PATCH | TRANSDERMAL | Status: DC
  Administered 2021-04-27: 1.5 mg via TRANSDERMAL
  Filled 2021-04-27: qty 1

## 2021-04-27 MED ORDER — OXYCODONE-ACETAMINOPHEN 5-325 MG PO TABS: 1.0000 | ORAL_TABLET | ORAL | 0 refills | Status: DC | PRN

## 2021-04-27 MED ORDER — ROCURONIUM BROMIDE 10 MG/ML (PF) SYRINGE
PREFILLED_SYRINGE | INTRAVENOUS | Status: DC | PRN
  Administered 2021-04-27: 50 mg via INTRAVENOUS

## 2021-04-27 MED ORDER — VANCOMYCIN HCL 1000 MG IV SOLR
INTRAVENOUS | Status: AC
  Filled 2021-04-27: qty 20

## 2021-04-27 MED ORDER — LACTATED RINGERS IV BOLUS
250.0000 mL | Freq: Once | INTRAVENOUS | Status: AC
  Administered 2021-04-27: 250 mL via INTRAVENOUS

## 2021-04-27 SURGICAL SUPPLY — 73 items
ADH SKN CLS APL DERMABOND .7 (GAUZE/BANDAGES/DRESSINGS) ×1
AID PSTN UNV HD RSTRNT DISP (MISCELLANEOUS) ×1
BAG COUNTER SPONGE SURGICOUNT (BAG) IMPLANT
BAG SPEC THK2 15X12 ZIP CLS (MISCELLANEOUS) ×1
BAG SPNG CNTER NS LX DISP (BAG)
BAG ZIPLOCK 12X15 (MISCELLANEOUS) ×2 IMPLANT
BLADE SAW SGTL 83.5X18.5 (BLADE) ×2 IMPLANT
BSPLAT GLND +2X24 MDLR (Joint) ×1 IMPLANT
COOLER ICEMAN CLASSIC (MISCELLANEOUS) ×2 IMPLANT
COVER BACK TABLE 60X90IN (DRAPES) ×2 IMPLANT
COVER SURGICAL LIGHT HANDLE (MISCELLANEOUS) ×2 IMPLANT
CUP SUT UNIV REVERS 36 NEUTRAL (Cup) ×1 IMPLANT
DERMABOND ADVANCED (GAUZE/BANDAGES/DRESSINGS) ×1
DERMABOND ADVANCED .7 DNX12 (GAUZE/BANDAGES/DRESSINGS) ×1 IMPLANT
DRAPE INCISE IOBAN 66X45 STRL (DRAPES) IMPLANT
DRAPE ORTHO SPLIT 77X108 STRL (DRAPES) ×4
DRAPE SHEET LG 3/4 BI-LAMINATE (DRAPES) ×2 IMPLANT
DRAPE SURG 17X11 SM STRL (DRAPES) ×2 IMPLANT
DRAPE SURG ORHT 6 SPLT 77X108 (DRAPES) ×2 IMPLANT
DRAPE TOP 10253 STERILE (DRAPES) ×2 IMPLANT
DRAPE U-SHAPE 47X51 STRL (DRAPES) ×2 IMPLANT
DRESSING AQUACEL AG SP 3.5X6 (GAUZE/BANDAGES/DRESSINGS) ×1 IMPLANT
DRSG AQUACEL AG ADV 3.5X 6 (GAUZE/BANDAGES/DRESSINGS) ×1 IMPLANT
DRSG AQUACEL AG ADV 3.5X10 (GAUZE/BANDAGES/DRESSINGS) IMPLANT
DRSG AQUACEL AG SP 3.5X6 (GAUZE/BANDAGES/DRESSINGS) ×2
DURAPREP 26ML APPLICATOR (WOUND CARE) ×2 IMPLANT
ELECT BLADE TIP CTD 4 INCH (ELECTRODE) ×2 IMPLANT
ELECT REM PT RETURN 15FT ADLT (MISCELLANEOUS) ×2 IMPLANT
FACESHIELD WRAPAROUND (MASK) ×8 IMPLANT
FACESHIELD WRAPAROUND OR TEAM (MASK) ×4 IMPLANT
GLENOID UNI REV MOD 24 +2 LAT (Joint) ×1 IMPLANT
GLENOSPHERE 36 +4 LAT/24 (Joint) ×1 IMPLANT
GLOVE SRG 8 PF TXTR STRL LF DI (GLOVE) ×1 IMPLANT
GLOVE SURG ENC MOIS LTX SZ7 (GLOVE) ×2 IMPLANT
GLOVE SURG ENC MOIS LTX SZ7.5 (GLOVE) ×2 IMPLANT
GLOVE SURG UNDER POLY LF SZ7 (GLOVE) ×2 IMPLANT
GLOVE SURG UNDER POLY LF SZ8 (GLOVE) ×2
GOWN STRL REUS W/TWL LRG LVL3 (GOWN DISPOSABLE) ×4 IMPLANT
KIT BASIN OR (CUSTOM PROCEDURE TRAY) ×2 IMPLANT
KIT TURNOVER KIT A (KITS) ×2 IMPLANT
LINER HUMERAL 36 +3MM SM (Shoulder) ×1 IMPLANT
MANIFOLD NEPTUNE II (INSTRUMENTS) ×2 IMPLANT
NDL TAPERED W/ NITINOL LOOP (MISCELLANEOUS) ×1 IMPLANT
NEEDLE TAPERED W/ NITINOL LOOP (MISCELLANEOUS) ×2 IMPLANT
NS IRRIG 1000ML POUR BTL (IV SOLUTION) ×2 IMPLANT
PACK SHOULDER (CUSTOM PROCEDURE TRAY) ×2 IMPLANT
PAD ARMBOARD 7.5X6 YLW CONV (MISCELLANEOUS) ×2 IMPLANT
PAD COLD SHLDR WRAP-ON (PAD) ×2 IMPLANT
PIN NITINOL TARGETER 2.8 (PIN) IMPLANT
PIN SET MODULAR GLENOID SYSTEM (PIN) IMPLANT
RESTRAINT HEAD UNIVERSAL NS (MISCELLANEOUS) ×2 IMPLANT
SCREW CENTRAL MODULAR 25 (Screw) ×1 IMPLANT
SCREW PERI LOCK 5.5X16 (Screw) ×2 IMPLANT
SCREW PERI LOCK 5.5X24 (Screw) ×1 IMPLANT
SCREW PERI LOCK 5.5X36 (Screw) ×1 IMPLANT
SLING ARM FOAM STRAP LRG (SOFTGOODS) ×1 IMPLANT
SLING ARM FOAM STRAP MED (SOFTGOODS) IMPLANT
SPONGE T-LAP 18X18 ~~LOC~~+RFID (SPONGE) ×2 IMPLANT
SPONGE T-LAP 4X18 ~~LOC~~+RFID (SPONGE) ×2 IMPLANT
STEM HUMERAL UNI REVERS SZ9 (Stem) ×1 IMPLANT
SUCTION FRAZIER HANDLE 12FR (TUBING) ×2
SUCTION TUBE FRAZIER 12FR DISP (TUBING) ×1 IMPLANT
SUT FIBERWIRE #2 38 T-5 BLUE (SUTURE)
SUT MNCRL AB 3-0 PS2 18 (SUTURE) ×2 IMPLANT
SUT MON AB 2-0 CT1 36 (SUTURE) ×2 IMPLANT
SUT VIC AB 1 CT1 36 (SUTURE) ×2 IMPLANT
SUTURE FIBERWR #2 38 T-5 BLUE (SUTURE) IMPLANT
SUTURE TAPE 1.3 40 TPR END (SUTURE) ×2 IMPLANT
SUTURETAPE 1.3 40 TPR END (SUTURE) ×4
TOWEL OR 17X26 10 PK STRL BLUE (TOWEL DISPOSABLE) ×2 IMPLANT
TOWEL OR NON WOVEN STRL DISP B (DISPOSABLE) ×2 IMPLANT
WATER STERILE IRR 1000ML POUR (IV SOLUTION) ×4 IMPLANT
YANKAUER SUCT BULB TIP 10FT TU (MISCELLANEOUS) ×2 IMPLANT

## 2021-04-27 NOTE — Anesthesia Postprocedure Evaluation (Signed)
Anesthesia Post Note  Patient: Sherry Dennis  Procedure(s) Performed: REVERSE SHOULDER ARTHROPLASTY (Right: Shoulder)     Patient location during evaluation: PACU Anesthesia Type: General Level of consciousness: awake and alert Pain management: pain level controlled Vital Signs Assessment: post-procedure vital signs reviewed and stable Respiratory status: spontaneous breathing, nonlabored ventilation, respiratory function stable and patient connected to nasal cannula oxygen Cardiovascular status: blood pressure returned to baseline and stable Postop Assessment: no apparent nausea or vomiting Anesthetic complications: no   No notable events documented.  Last Vitals:  Vitals:   04/27/21 1100 04/27/21 1130  BP: 135/71 (!) 149/81  Pulse: 65 61  Resp: 18 18  Temp:    SpO2: 92% 93%    Last Pain:  Vitals:   04/27/21 1130  TempSrc:   PainSc: 0-No pain                 Effie Berkshire

## 2021-04-27 NOTE — Anesthesia Procedure Notes (Addendum)
Procedure Name: Intubation Date/Time: 04/27/2021 7:46 AM Performed by: West Pugh, CRNA Pre-anesthesia Checklist: Patient identified, Emergency Drugs available, Suction available and Patient being monitored Patient Re-evaluated:Patient Re-evaluated prior to induction Oxygen Delivery Method: Circle system utilized Preoxygenation: Pre-oxygenation with 100% oxygen Induction Type: IV induction Ventilation: Oral airway inserted - appropriate to patient size and Mask ventilation without difficulty Laryngoscope Size: 2 and Miller Grade View: Grade II Tube type: Oral Tube size: 7.0 mm Number of attempts: 1 Airway Equipment and Method: Stylet and Oral airway Placement Confirmation: ETT inserted through vocal cords under direct vision, positive ETCO2, CO2 detector and breath sounds checked- equal and bilateral Secured at: 23 cm Tube secured with: Tape Dental Injury: Teeth and Oropharynx as per pre-operative assessment  Comments: Greer Ee RN,SRNA provided the intubation. AOI, CRNA and Anesthesiologist present throughout procedure.

## 2021-04-27 NOTE — Transfer of Care (Signed)
Immediate Anesthesia Transfer of Care Note  Patient: Sherry Dennis  Procedure(s) Performed: REVERSE SHOULDER ARTHROPLASTY (Right: Shoulder)  Patient Location: PACU  Anesthesia Type:GA combined with regional for post-op pain  Level of Consciousness: awake  Airway & Oxygen Therapy: Patient Spontanous Breathing and Patient connected to face mask  Post-op Assessment: Report given to RN and Post -op Vital signs reviewed and stable  Post vital signs: Reviewed and stable  Last Vitals:  Vitals Value Taken Time  BP 181/100 04/27/21 0932  Temp    Pulse 69 04/27/21 0936  Resp 17 04/27/21 0936  SpO2 100 % 04/27/21 0936  Vitals shown include unvalidated device data.  Last Pain:  Vitals:   04/27/21 0549  TempSrc: Oral  PainSc:       Patients Stated Pain Goal: 4 ([id number])  Complications: No notable events documented.

## 2021-04-27 NOTE — Op Note (Signed)
04/27/2021  9:13 AM  PATIENT:   Sherry Dennis  60 y.o. female  PRE-OPERATIVE DIAGNOSIS:  Right shoulder rheumatoid arthritis/osteoarthritis  POST-OPERATIVE DIAGNOSIS: Same  PROCEDURE: Right shoulder reverse arthroplasty utilizing a press-fit size 9 Arthrex stem with a neutral metaphysis, +3 polyethylene insert, 36/+4 glenosphere on a small/+2 baseplate  SURGEON:  Kaelie Henigan, Metta Clines M.D.  ASSISTANTS: Jenetta Loges, PA-C  ANESTHESIA:   General endotracheal and interscalene block with Exparel  EBL: 150 cc  SPECIMEN: None  Drains: None   PATIENT DISPOSITION:  PACU - hemodynamically stable.    PLAN OF CARE: Discharge to home after PACU  Brief history:  [name] is a 60 year old female who has had chronic progressive increasing right shoulder pain related to combined osteo and rheumatoid arthritis of the right shoulder as well as associated significant rotator cuff dysfunction.  Due to her increasing pain and functional mentation she is brought to the operating this time for planned right shoulder reverse arthroplasty.  Preoperatively, I counseled the patient regarding treatment options and risks versus benefits thereof.  Possible surgical complications were all reviewed including potential for bleeding, infection, neurovascular injury, persistent pain, loss of motion, anesthetic complication, failure of the implant, and possible need for additional surgery. They understand and accept and agrees with our planned procedure.   Procedure in detail:  After undergoing routine preop evaluation the patient received prophylactic antibiotics and interscalene block with Exparel was established in the holding area by the anesthesia department.  Patient was subsequently placed supine on the operating table and underwent the smooth induction of a general endotracheal anesthesia.  Placed into the beachchair position and appropriately padded and protected.  The right shoulder girdle region was  sterilely prepped and draped in standard fashion.  Timeout was called.  A deltopectoral approach the right shoulder was made through an 8 cm incision.  Skin flaps elevated dissection carried deeply and the deltopectoral interval was developed from proximal to distal with the vein taken laterally.  Upper centimeter pectoralis major was tenotomized for exposure.  Conjoined tendon mobilized and retracted medially.  The long head biceps tendon was then tenodesed at the upper border the pectoralis major tendon with the proximal segment then unroofed and excised.  Rotator cuff was then split from the apex of the bicipital groove to the base of the coracoid and the subscapularis was then separated from the lesser tuberosity in a subperiosteal fashion using electrocautery and the margin was then tagged with a pair of suture tape sutures.  Capsular attachments were then divided from the anterior and infra margins of the humeral neck and humeral head was then delivered through the wound.  An extra medullary guide was then used to outline a proposed humeral head resection which was performed with an oscillating saw at approximate 20 degrees retroversion.  A metal cap was then placed over the cut proximal humeral surface and we then exposed the glenoid with appropriate retractors.  A circumferential labral resection was then performed.  A guidepin was then directed into the center of the glenoid with an approximately 10 degree inferior tilt and the glenoid was then reamed with the central followed by peripheral reamer down to stable subchondral bony bed.  All debris was then carefully removed.  Glenoid preparation completed with the central drill and tapped for a 25 mm lag screw.  Our baseplate was then assembled and vancomycin powder applied to the threads of the screw and baseplate was inserted with excellent fit and fixation.  The peripheral locking  screws were all then placed using standard technique with excellent  fixation.  A 36/+4 glenosphere was then impacted onto the baseplate and the central locking screw was then placed.  We then returned our attention to the proximal humerus where the canal was opened by hand reaming and broached up to a size 9 at approximately 20 degrees retroversion.  A neutral metaphyseal reaming guide was placed in the metaphysis was prepared.  A trial implant was then placed showing good motion good stability good soft tissue balance.  The trial was then removed the final implant was assembled.  Vancomycin powder was then spread into the humeral canal after it had been cleaned and dried and the final implant was then impacted with excellent fit and fixation.  Series of trial reductions then completed demonstrating that the +3 probably give [place] the best motion good stability good soft tissue balance.  The trial polyp was then removed the implant was cleaned and dried the final polyp was then impacted and final reduction was then performed.  This again showed excellent motion good stability good soft tissue balance.  The joint was then copiously irrigated.  Hemostasis was obtained.  We confirmed good elasticity of the subscapularis which was then repaired back to the eyelets on the collar the implant using the previously placed suture tape sutures.  The arm easily achieved 45 degrees of external rotation without excessive tension on the subscap repair.  The joint again irrigated.  Final hemostasis was obtained.  The balance of the vancomycin powder was then spread liberally throughout the deep soft tissue layers.  The deltopectoral interval was reapproximated with a series of figure-of-eight #1 Vicryl sutures.  2-0 Monocryl used for the subcu layer and intracuticular 3-0 Monocryl for the skin followed by Dermabond Aquacel dressing.  Right arm was placed in a sling.  The patient was awakened, extubated, and taken to the recovery room in stable condition.  Jenetta Loges, PA-C was utilized as an  Environmental consultant throughout this case, essential for help with positioning the patient, positioning extremity, tissue manipulation, implantation of the prosthesis, suture management, wound closure, and intraoperative decision-making.  Marin Shutter MD   Contact # [phone]

## 2021-04-27 NOTE — Evaluation (Signed)
Occupational Therapy Evaluation Patient Details Name: Sherry Dennis MRN: [id number] DOB: [birth year] Today's Date: 04/27/2021   History of Present Illness Patient is a 60 year old female s/p L reverse total shoulder arthroplasty. PMH includes knee replacement, cardiac cath, HTn   Clinical Impression   Patient is a 60 year old female s/p shoulder replacement without functional use of right dominant upper extremity secondary to effects of surgery and interscalene block and shoulder precautions. Therapist provided education and instruction to patient and son in regards to exercises, precautions, positioning, donning upper extremity clothing and bathing while maintaining shoulder precautions, ice and edema management and donning/doffing sling. Patient and son verbalized understanding and demonstrated as needed. Patient needed assistance to donn shirt, underwear, pants, socks and shoes and provided with instruction on compensatory strategies to perform ADLs. Patient to follow up with MD for further therapy needs.        Recommendations for follow up therapy are one component of a multi-disciplinary discharge planning process, led by the attending physician.  Recommendations may be updated based on patient status, additional functional criteria and insurance authorization.   Follow Up Recommendations  Follow surgeon's recommendation for DC plan and follow-up therapies    Equipment Recommendations  None recommended by OT       Precautions / Restrictions Precautions Precautions: Shoulder Type of Shoulder Precautions: If sitting in controlled environment, ok to come out of sling to give neck a break. Please sleep in it to protect until follow up in office.OK to use operative arm for feeding, hygiene and ADLs. Ok to instruct Pendulums and lap slides as exercises. Ok to use operative arm within the following parameters for ADL purposes Ok for PROM, AAROM, AROM within pain tolerance and within the  following ROM   ER 20   ABD 45   FE 60. AROM elbow, wrist, hand ok Shoulder Interventions: Shoulder sling/immobilizer;Off for dressing/bathing/exercises Precaution Booklet Issued: Yes (comment) Required Braces or Orthoses: Sling Restrictions Weight Bearing Restrictions: Yes RUE Weight Bearing: Non weight bearing      Mobility Bed Mobility                    Transfers Overall transfer level: Independent Equipment used: None                  Balance Overall balance assessment: No apparent balance deficits (not formally assessed)                                         ADL either performed or assessed with clinical judgement   ADL Overall ADL's : Needs assistance/impaired     Grooming: Wash/dry hands;Independent;Standing   Upper Body Bathing: Minimal assistance;Standing   Lower Body Bathing: Moderate assistance;Sitting/lateral leans;Sit to/from stand   Upper Body Dressing : Moderate assistance;Cueing for sequencing;Standing Upper Body Dressing Details (indicate cue type and reason): assist to thread R UE and button shirt Lower Body Dressing: Moderate assistance;Sitting/lateral leans;Sit to/from stand Lower Body Dressing Details (indicate cue type and reason): to initiate threading pants/pulling up over R hip Toilet Transfer: Independent;Ambulation   Toileting- Clothing Manipulation and Hygiene: Minimal assistance;Sit to/from stand       Functional mobility during ADLs: Independent General ADL Comments: patient and son educated in compensatory strategies for ADL tasks in order to maintain shoulder precautions      Pertinent Vitals/Pain Pain Assessment: Faces Faces Pain Scale: Hurts a  little bit Pain Location: R UE Pain Descriptors / Indicators: Numbness;Heaviness Pain Intervention(s): Monitored during session     Hand Dominance Right   Extremity/Trunk Assessment Upper Extremity Assessment Upper Extremity Assessment: RUE  deficits/detail RUE Deficits / Details: + nerve block   Lower Extremity Assessment Lower Extremity Assessment: Overall WFL for tasks assessed   Cervical / Trunk Assessment Cervical / Trunk Assessment: Normal   Communication Communication Communication: No difficulties   Cognition Arousal/Alertness: Awake/alert Behavior During Therapy: WFL for tasks assessed/performed Overall Cognitive Status: Within Functional Limits for tasks assessed                                        Exercises Exercises: Shoulder   Shoulder Instructions Shoulder Instructions Donning/doffing shirt without moving shoulder: Moderate assistance;Caregiver independent with task;Patient able to independently direct caregiver Method for sponge bathing under operated UE: Minimal assistance;Caregiver independent with task;Patient able to independently direct caregiver Donning/doffing sling/immobilizer: Moderate assistance;Caregiver independent with task;Patient able to independently direct caregiver Correct positioning of sling/immobilizer: Caregiver independent with task;Patient able to independently direct caregiver Pendulum exercises (written home exercise program): Caregiver independent with task;Patient able to independently direct caregiver ROM for elbow, wrist and digits of operated UE: Caregiver independent with task;Patient able to independently direct caregiver Sling wearing schedule (on at all times/off for ADL's): Caregiver independent with task;Patient able to independently direct caregiver Proper positioning of operated UE when showering: Caregiver independent with task;Patient able to independently direct caregiver Dressing change:  (N/A) Positioning of UE while sleeping: Caregiver independent with task;Patient able to independently direct caregiver    Home Living Family/patient expects to be discharged to:: Private residence Living Arrangements: Alone Available Help at Discharge:  Family;Available 24 hours/day Type of Home: House Home Access: Stairs to enter [employer] of Steps: 3   Home Layout: Two level;Able to live on main level with bedroom/bathroom     Bathroom Shower/Tub: Teacher, early years/pre: Standard     Home Equipment: None   Additional Comments: patient is going to her DTRs at D/C, info above for DTR house      Prior Functioning/Environment Level of Independence: Independent                 OT Problem List: Pain;Impaired UE functional use;Decreased knowledge of precautions         OT Goals(Current goals can be found in the care plan section) Acute Rehab OT Goals Patient Stated Goal: home with daughter OT Goal Formulation: All assessment and education complete, DC therapy   AM-PAC OT "6 Clicks" Daily Activity     Outcome Measure Help from another person eating meals?: None Help from another person taking care of personal grooming?: None Help from another person toileting, which includes using toliet, bedpan, or urinal?: A Little Help from another person bathing (including washing, rinsing, drying)?: A Lot Help from another person to put on and taking off regular upper body clothing?: A Lot Help from another person to put on and taking off regular lower body clothing?: A Lot 6 Click Score: 17   End of Session Equipment Utilized During Treatment: Other (comment) (sling) Nurse Communication: Other (comment) (OT complete)  Activity Tolerance: Patient tolerated treatment well Patient left: in chair;with call bell/phone within reach;with family/visitor present  OT Visit Diagnosis: Pain Pain - Right/Left: Right Pain - part of body: Shoulder  Time: 1120-1150 OT Time Calculation (min): 30 min Charges:  OT General Charges $OT Visit: 1 Visit OT Evaluation $OT Eval Low Complexity: 1 Low OT Treatments $Self Care/Home Management : 8-22 mins  Delbert Phenix OT OT pager: [phone]  Rosemary Holms 04/27/2021, 12:03 PM

## 2021-04-27 NOTE — Discharge Instructions (Signed)
 Kevin M. Supple, M.D., F.A.A.O.S. Orthopaedic Surgery Specializing in Arthroscopic and Reconstructive Surgery of the Shoulder [phone] [address] - [place], [place] [address] - Fax [phone]   POST-OP TOTAL SHOULDER REPLACEMENT INSTRUCTIONS  1. Follow up in the office for your first post-op appointment 10-14 days from the date of your surgery. If you do not already have a scheduled appointment, our office will contact you to schedule.  2. The bandage over your incision is waterproof. You may begin showering with this dressing on. You may leave this dressing on until first follow up appointment within 2 weeks. We prefer you leave this dressing in place until follow up however after 5-7 days if you are having itching or skin irritation and would like to remove it you may do so. Go slow and tug at the borders gently to break the bond the dressing has with the skin. At this point if there is no drainage it is okay to go without a bandage or you may cover it with a light guaze and tape. You can also expect significant bruising around your shoulder that will drift down your arm and into your chest wall. This is very normal and should resolve over several days.   3. Wear your sling/immobilizer at all times except to perform the exercises below or to occasionally let your arm dangle by your side to stretch your elbow. You also need to sleep in your sling immobilizer until instructed otherwise. It is ok to remove your sling if you are sitting in a controlled environment and allow your arm to rest in a position of comfort by your side or on your lap with pillows to give your neck and skin a break from the sling. You may remove it to allow arm to dangle by side to shower. If you are up walking around and when you go to sleep at night you need to wear it.  4. Range of motion to your elbow, wrist, and hand are encouraged 3-5 times daily. Exercise to your hand and fingers helps to reduce  swelling you may experience.   5. Prescriptions for a pain medication and a muscle relaxant are provided for you. It is recommended that if you are experiencing pain that you pain medication alone is not controlling, add the muscle relaxant along with the pain medication which can give additional pain relief. The first 1-2 days is generally the most severe of your pain and then should gradually decrease. As your pain lessens it is recommended that you decrease your use of the pain medications to an "as needed basis'" only and to always comply with the recommended dosages of the pain medications.  6. Pain medications can produce constipation along with their use. If you experience this, the use of an over the counter stool softener or laxative daily is recommended.   7. For additional questions or concerns, please do not hesitate to call the office. If after hours there is an answering service to forward your concerns to the physician on call.  8.Pain control following an exparel block  To help control your post-operative pain you received a nerve block  performed with Exparel which is a long acting anesthetic (numbing agent) which can provide pain relief and sensations of numbness (and relief of pain) in the operative shoulder and arm for up to 3 days. Sometimes it provides mixed relief, meaning you may still have numbness in certain areas of the arm but can still be able to   move  parts of that arm, hand, and fingers. We recommend that your prescribed pain medications  be used as needed. We do not feel it is necessary to "pre medicate" and "stay ahead" of pain.  Taking narcotic pain medications when you are not having any pain can lead to unnecessary and potentially dangerous side effects.    9. Use the ice machine as much as possible in the first 5-7 days from surgery, then you can wean its use to as needed. The ice typically needs to be replaced every 6 hours, instead of ice you can actually freeze  water bottles to put in the cooler and then fill water around them to avoid having to purchase ice. You can have spare water bottles freezing to allow you to rotate them once they have melted. Try to have a thin shirt or light cloth or towel under the ice wrap to protect your skin.   FOR ADDITIONAL INFO ON ICE MACHINE AND INSTRUCTIONS GO TO THE WEBSITE AT  https://www.[employer].com/products/donjoy/[email or web address]-iceman-[email or web address]  10.  We recommend that you avoid any dental work or cleaning in the first 3 months following your joint replacement. This is to help minimize the possibility of infection from the bacteria in your mouth that enters your bloodstream during dental work. We also recommend that you take an antibiotic prior to your dental work for the first year after your shoulder replacement to further help reduce that risk. Please simply contact our office for antibiotics to be sent to your pharmacy prior to dental work.  11. Dental Antibiotics:  In most cases prophylactic antibiotics for Dental procdeures after total joint surgery are not necessary.  Exceptions are as follows:  1. History of prior total joint infection  2. Severely immunocompromised (Organ Transplant, cancer chemotherapy, Rheumatoid biologic meds such as Humera)  3. Poorly controlled diabetes (A1C &gt; 8.0, blood glucose over 200)  If you have one of these conditions, contact your surgeon for an antibiotic prescription, prior to your dental procedure.   POST-OP EXERCISES  Pendulum Exercises  Perform pendulum exercises while standing and bending at the waist. Support your uninvolved arm on a table or chair and allow your operated arm to hang freely. Make sure to do these exercises passively - not using you shoulder muscles. These exercises can be performed once your nerve block effects have worn off.  Repeat 20 times. Do 3 sessions per day.     

## 2021-04-27 NOTE — Anesthesia Procedure Notes (Signed)
Anesthesia Regional Block: Interscalene brachial plexus block   Pre-Anesthetic Checklist: , timeout performed,  Correct Patient, Correct Site, Correct Laterality,  Correct Procedure, Correct Position, site marked,  Risks and benefits discussed,  Surgical consent,  Pre-op evaluation,  At surgeon's request and post-op pain management  Laterality: Right  Prep: chloraprep       Needles:  Injection technique: Single-shot  Needle Type: Echogenic Stimulator Needle     Needle Length: 9cm  Needle Gauge: 21     Additional Needles:   Procedures:,,,, ultrasound used (permanent image in chart),,    Narrative:  Start time: 04/27/2021 7:05 AM End time: 04/27/2021 7:10 AM Injection made incrementally with aspirations every 5 mL.  Performed by: Personally  Anesthesiologist: Effie Berkshire, MD  Additional Notes: Patient tolerated the procedure well. Local anesthetic introduced in an incremental fashion under minimal resistance after negative aspirations. No paresthesias were elicited. After completion of the procedure, no acute issues were identified and patient continued to be monitored by RN.

## 2021-04-27 NOTE — H&P (Signed)
Sherry Dennis    Chief Complaint: Right shoulder rheumatoid arthritis/osteoarthritis HPI: The patient is a 60 y.o. female with chronic and progressively increasing right shoulder pain related to severe arthritis, combination of both rheumatoid and osteoarthritis.  Due to her increasing functional mutations and failure to respond to prolonged attempts at conservative management, she is brought to the operating room at this time for planned right shoulder reverse arthroplasty  Past Medical History:  Diagnosis Date   Allergy    Anemia    Anxiety    Arthritis    "knees, hands, ankles, ~ every joint" (04/08/2015)   Basal cell carcinoma    "face, hands, chest" (04/08/2015)   Chronic bronchitis ([place])    "get it pretty much q yr" (04/08/2015)   COPD (chronic obstructive pulmonary disease) ([place])    Dysrhythmia    palpitations occ; SVT 09/2013 s/p adenosine   GERD (gastroesophageal reflux disease)    H/O hiatal hernia    Hx of cardiovascular stress test    Lexiscan Myoview 6/16: Ejection fraction is 60% and wall motion is normal. The study is normal. There is no scar or ischemia. This is a low risk scan.   Hyperlipidemia    Hypertension    Hypothyroidism    Palpitations    PONV (postoperative nausea and vomiting)    S/P total knee arthroplasty 02/15/2014   Sleep apnea    Thyroid disease    Vitamin D deficiency     Past Surgical History:  Procedure Laterality Date   BASAL CELL CARCINOMA EXCISION  2014   "off my chest"   [place]   "lasered the holes shut"   CARDIAC CATHETERIZATION  2017   CARDIAC SURGERY     CARPAL TUNNEL RELEASE Right 1990   COLONOSCOPY     COLONOSCOPY WITH PROPOFOL N/A 11/25/2017   Procedure: COLONOSCOPY WITH PROPOFOL;  Surgeon: Mauri Pole, MD;  Location: [facility];  Service: Endoscopy;  Laterality: N/A;   ELECTROPHYSIOLOGIC STUDY N/A 04/08/2015   Procedure: SVT Ablation;  Surgeon: Evans Lance, MD;  Location: [facility];  Service:  Cardiovascular;  Laterality: N/A;   ENDOMETRIAL ABLATION  2009   INCONTINENCE SURGERY  2010   JOINT REPLACEMENT     KNEE ARTHROSCOPY Right 2013   KNEE ARTHROSCOPY     LAPAROSCOPIC GASTRIC SLEEVE RESECTION N/A 05/26/2019   Procedure: LAPAROSCOPIC GASTRIC SLEEVE RESECTION, Upper Endo, ERAS Pathway;  Surgeon: Greer Pickerel, MD;  Location: [facility];  Service: General;  Laterality: N/A;   PARTIAL KNEE ARTHROPLASTY Right 02/15/2014   Procedure: RIGHT UNICOMPARTMENTAL KNEE (MEDIAL COMPARTMENT);  Surgeon: Vickey Huger, MD;  Location: [facility];  Service: Orthopedics;  Laterality: Right;   POLYPECTOMY     SPLENECTOMY, TOTAL  05/26/2019   Procedure: SPLENECTOMY;  Surgeon: Greer Pickerel, MD;  Location: [facility];  Service: General;;   TUBAL LIGATION  1987    Family History  Problem Relation Age of Onset   Hypertension Mother    Atrial fibrillation Mother    Diabetes Father    Emphysema Father    COPD Father    Breast cancer Paternal Aunt        x 4 aunts,    Stroke Maternal Grandmother    Heart attack Maternal Grandmother    Hyperlipidemia Maternal Grandfather    Heart disease Maternal Grandfather    Hyperlipidemia Paternal Grandmother    Heart disease Paternal Grandmother    Hyperlipidemia Paternal Grandfather    Heart disease Paternal Grandfather  Colon cancer Neg Hx    Colon polyps Neg Hx    Esophageal cancer Neg Hx    Rectal cancer Neg Hx    Ulcerative colitis Neg Hx    Stomach cancer Neg Hx     Social History:  reports that she has been smoking cigarettes. She has a 45.00 pack-year smoking history. She has never used smokeless tobacco. She reports that she does not drink alcohol and does not use drugs.   Medications Prior to Admission  Medication Sig Dispense Refill   acetaminophen (TYLENOL) 650 MG CR tablet Take 650 mg by mouth 3 (three) times daily as needed for pain.     albuterol (VENTOLIN HFA) 108 (90 Base) MCG/ACT inhaler Inhale 2 puffs into the lungs every 4 (four) hours as needed  for wheezing or shortness of breath. 18 g 2   bisoprolol-hydrochlorothiazide (ZIAC) 5-6.25 MG tablet TAKE 1 TABLET BY MOUTH EVERY DAY FOR BLOOD PRESSURE (Patient taking differently: Take 1 tablet by mouth daily. TAKE 1 TABLET BY MOUTH EVERY DAY FOR BLOOD PRESSURE) 90 tablet 1   Budeson-Glycopyrrol-Formoterol (BREZTRI AEROSPHERE) 160-9-4.8 MCG/ACT AERO Inhale 1 Pump into the lungs in the morning and at bedtime. (Patient taking differently: Inhale 1 puff into the lungs 2 (two) times daily as needed (COPD).) 10.7 g 1   busPIRone (BUSPAR) 10 MG tablet Take  1 tablet  3 x /day for Anxiety   /  TAKE 1 TABLET BY MOUTH THREE TIMES A DAY **INS 30 DAY** 270 tablet 3   cyanocobalamin (,VITAMIN B-12,) 1000 MCG/ML injection Inject 1066mg once weekly for 6 weeks, then once a month (Patient taking differently: Inject 1,000 mcg into the muscle every 30 (thirty) days.) 30 mL 0   escitalopram (LEXAPRO) 20 MG tablet TAKE 1 TABLET BY MOUTH EVERY DAY (Patient taking differently: Take 20 mg by mouth daily.) 30 tablet 8   folic acid (FOLVITE) 1 MG tablet Take 2 mg by mouth at bedtime.     levocetirizine (XYZAL) 5 MG tablet Take 5 mg by mouth every evening.     levothyroxine (SYNTHROID) 137 MCG tablet TAKE 1 TABLET DAILY ON AN EMPTY STOMACH WITH ONLY WATER FOR 30 MINUTES & NO ANTACID MEDS, CALCIUM OR MAGNESIUM FOR 4 HOURS & AVOID BIOTIN (Patient taking differently: Take 137 mcg by mouth daily before breakfast. Take 1 tablet daily on an empty stomach with only water for 30 minutes & no Antacid meds, Calcium or Magnesium for 4 hours & avoid Biotin) 90 tablet 1   meclizine (ANTIVERT) 25 MG tablet Take 1 tablet (25 mg total) by mouth 3 (three) times daily as needed for dizziness. 30 tablet 0   Menthol, Topical Analgesic, (BIOFREEZE ROLL-ON EX) Apply 1 application topically daily as needed (pain).     methotrexate 50 MG/2ML injection Inject 150 mg into the muscle once a week.     olmesartan (BENICAR) 40 MG tablet Take  1 tablet   Daily  for BP   /  TAKE 1 TABLET DAILY FOR BLOOD PRESSURE 90 tablet 3   OVER THE COUNTER MEDICATION Apply 1 application topically 5 (five) times daily as needed (pain). CBD Cream     pantoprazole (PROTONIX) 20 MG tablet TAKE 1 TABLET DAILY FOR INDIGESTION & HEARTBURN (Patient taking differently: Take 20 mg by mouth daily as needed for heartburn or indigestion.) 90 tablet 3   phentermine (ADIPEX-P) 37.5 MG tablet Take 1/2 to 1 tablet every Morning for Dieting & Weight Loss (Patient taking differently: Take 37.5 mg by  mouth daily before breakfast.) 90 tablet 1   topiramate (TOPAMAX) 50 MG tablet Take 1/2 to 1 tablet 2 x /day at Suppertime & Bedtime for Dieting & Weight Loss (Patient taking differently: Take 50 mg by mouth 2 (two) times daily. Take1 tablet 2 x /day at Suppertime & Bedtime for Dieting & Weight Loss) 180 tablet 1   triamcinolone cream (KENALOG) 0.5 % Apply 1 application topically 2 (two) times daily. (Patient taking differently: Apply 1 application topically 2 (two) times daily as needed (irritation).) 80 g 2   Vitamin D, Ergocalciferol, (DRISDOL) 1.25 MG (50000 UNIT) CAPS capsule TAKE 1 CAPSULE BY MOUTH 3 DAYS A WEEK FOR VITAMIN DEFICIENCY (Patient taking differently: Take 50,000 Units by mouth daily.) 12 capsule 5   Abatacept (ORENCIA IV) Inject 7.5 mLs into the skin every 30 (thirty) days.       Physical Exam: Right shoulder demonstrates painful and severely guarded motion is noted at her recent office visits.  Plain radiographs confirm changes consistent with chronic rheumatoid and osteoarthritic arthropathy.  Vitals  Temp:  [98.1 F (36.7 C)] 98.1 F (36.7 C) (09/15 0549) Pulse Rate:  [55] 55 (09/15 0549) Resp:  [18] 18 (09/15 0549) BP: (158)/(82) 158/82 (09/15 0549) SpO2:  [96 %] 96 % (09/15 0549) Weight:  [116.6 kg] 116.6 kg (09/15 0537)  Assessment/Plan  Impression: Right shoulder rheumatoid arthritis/osteoarthritis  Plan of Action: Procedure(s): REVERSE SHOULDER  ARTHROPLASTY  Hridaan Bouse M Shaquandra Galano 04/27/2021, 6:41 AM Contact # [phone]

## 2021-04-29 ENCOUNTER — Encounter (HOSPITAL_COMMUNITY): Payer: Self-pay | Admitting: Orthopedic Surgery

## 2021-05-03 ENCOUNTER — Other Ambulatory Visit: Payer: Self-pay | Admitting: Adult Health

## 2021-05-03 DIAGNOSIS — E559 Vitamin D deficiency, unspecified: Secondary | ICD-10-CM

## 2021-05-07 ENCOUNTER — Other Ambulatory Visit: Payer: Self-pay | Admitting: Adult Health

## 2021-05-07 DIAGNOSIS — E039 Hypothyroidism, unspecified: Secondary | ICD-10-CM

## 2021-05-29 LAB — HM MAMMOGRAPHY

## 2021-06-05 ENCOUNTER — Encounter: Payer: Self-pay | Admitting: Internal Medicine

## 2021-06-14 ENCOUNTER — Encounter: Payer: Self-pay | Admitting: Internal Medicine

## 2021-06-19 ENCOUNTER — Other Ambulatory Visit: Payer: Self-pay | Admitting: Adult Health

## 2021-06-22 ENCOUNTER — Other Ambulatory Visit: Payer: Self-pay | Admitting: Adult Health

## 2021-07-27 ENCOUNTER — Encounter: Payer: Self-pay | Admitting: Internal Medicine

## 2021-09-04 ENCOUNTER — Encounter: Payer: 59 | Admitting: Adult Health Nurse Practitioner

## 2021-09-28 ENCOUNTER — Encounter: Payer: 59 | Admitting: Adult Health Nurse Practitioner

## 2021-09-28 ENCOUNTER — Encounter: Payer: 59 | Admitting: Adult Health

## 2021-10-01 ENCOUNTER — Other Ambulatory Visit: Payer: Self-pay | Admitting: Internal Medicine

## 2021-10-12 ENCOUNTER — Encounter: Payer: 59 | Admitting: Adult Health

## 2021-10-12 NOTE — Progress Notes (Unsigned)
COMPLETE PHYSICAL  Assessment and Plan:   Encounter for general adult medical examination with abnormal findings Yearly  Aortic atherosclerosis ([place]) Control blood pressure, cholesterol, glucose, increase exercise.  Per CT 08/30/2017  PSVT (paroxysmal supraventricular tachycardia) (HCC) Continue cardio follow up  COPD GOLD 0/ former smoker at risk No triggers, well controlled symptoms, cont to monitor  Nocturnal hypoxemia due to emphysema (HCC) Monitor  OSA on CPAP Continue CPAP nightly Continue CPAP/BiPAP, using nightly for at least 8 hours  Helping with daytime fatigue Weight loss still advised Discussed mask & tubing hygeine  Rheumatoid arthritis, involving unspecified site, unspecified whether rheumatoid factor present ([place]) Continue follow up   Mixed hyperlipidemia check lipids decrease fatty foods increase activity.   Hypothyroidism Taking levothyroxine 132mg daily half tablet Sat & Sun. Reminder to take on an empty stomach 30-628ms before first meal of the day. No antacid medications for 4 hours. Check TSH level, continue medications the same, reminded to take on an empty stomach 30-6037m before food.  -     TSH  Vitamin D deficiency Continue supplement  Adenoma of left adrenal gland Monitor  Essential hypertension -     Urinalysis, Routine w reflex microscopic -     Microalbumin / creatinine urine ratio - continue medications, DASH diet, exercise and monitor at home. Call if greater than 130/80.   Polyp of cecum Monitor Colonoscopy UTD  Former smoker Monitor Started again, discussed with patient. ***  Anxiety Well managed by current regimen; continue medications Stress management techniques discussed, increase water, good sleep hygiene discussed, increase exercise, and increase veggies.   H/O splenectomy ***  Morbid obesity ([place] follow up 3 months for progress monitoring - increase veggies, decrease carbs - long discussion about  weight loss, diet, and exercise - reach water goal, add in exercise.   S/P laparoscopic sleeve gastrectomy Continue follow up Some tenderness, continue to monitor  Screening, anemia, deficiency, iron -     Iron, Total/Total Iron Binding Cap -     Vitamin B12  B12 deficiency -     Vitamin B12  Medication Management Continued    Further disposition pending results if labs check today. Discussed med's effects and SE's.   Over 30 minutes of face to face interview, exam, counseling, chart review, and critical decision making was performed.   Continue diet and meds as discussed. Further disposition pending results of labs. Future Appointments  Date Time Provider DepHumboldt/09/2021  2:00 PM CorLiane ComberP [name] None  10/17/2022  2:00 PM CorLiane ComberP [name] None    HPI [age]o. female  presents for 3 month follow up with HTN, HLD, prediabetes and vitamin D and CPE. has Hypertension; Hyperlipidemia; Thyroid disease; Vitamin D deficiency; PSVT (paroxysmal supraventricular tachycardia) (HCCVenangoRheumatoid arthritis (HCCChiloCOPD GOLD 0/ former smoker at risk; External hemorrhoid, bleeding; Rectal pain; [place]ortic atherosclerosis [place] 08/31/2017; Adrenal adenoma; History of colonic polyps; Polyp of cecum; OSA on CPAP; Nocturnal hypoxemia due to emphysema (HCCViccoFormer smoker; Morbid obesity (HCCGales FerryS/P laparoscopic sleeve gastrectomy; H/O splenectomy; and Anxiety on their problem list.    GYN *** overdue ***   She has a history of RA, on methotrexate and folic acid.  Following with Dr. BeeAmil Amend last OV was 05/18/20 She follows with pain clinic for chronic neck pain, gets injections. Routinely using tylenol, heating pad, voltaren gel  She had the gastric sleeve 05/26/2019, highest weight was 273 lb.  Due to complications she had a splenectomy  She is having some abdominal tenderness s/p gastric sleeve.  RLQ RUQ tenderness and reports it feels heavy.  LBM was  today, takes miralax daily with breakfast. Denies any cramping, bloating, nausea or vomiting.    She has gone back to smoking, a few everyday. She does have hx of COPD, gold 0 *** OSA on CPAP ***, also nocturnal hypoxia ***  She has hx of anxiety improved on lexapro 20 mg, buspirone ***  BMI is There is no height or weight on file to calculate BMI., she is working on diet and exercise. She has not met her fluid intake, she has met her protein intake.  Wt Readings from Last 3 Encounters:  04/27/21 257 lb (116.6 kg)  04/18/21 257 lb (116.6 kg)  03/23/21 237 lb 12.8 oz (107.9 kg)    Her blood pressure {HAS HAS NOT:18834} been controlled at home, today their BP is    She {DOES_DOES XBD:53299} workout. She denies chest pain, shortness of breath, dizziness.  She is not on cholesterol medication, family history of MI, GM at 41, mom at 54 and denies myalgias. Had recent Stress test 03/2019, normal Echo, did show LVH.  Her cholesterol is not at goal. The cholesterol last visit was:   Lab Results  Component Value Date   CHOL 179 03/23/2021   HDL 45 (L) 03/23/2021   LDLCALC 105 (H) 03/23/2021   TRIG 175 (H) 03/23/2021   CHOLHDL 4.0 03/23/2021   She has been working on diet and exercise for prediabetes, and denies paresthesia of the feet, polydipsia and polyuria. Last A1C in the office was:  Lab Results  Component Value Date   HGBA1C 5.9 (H) 03/23/2021   Patient is on Vitamin D supplement, she is on 50,000, 3 days a week- not taking well.    Lab Results  Component Value Date   VD25OH 48 03/23/2021     She is on thyroid medication. Her medication is on 137mg daily with water 1 hour before food.- she admits she was off schedule but she is back on it since last visit.  Lab Results  Component Value Date   TSH 1.09 03/23/2021  .  She has a B12 def, on B12 injections, she has taken in Dec and Jan.   Lab Results  Component Value Date   VMEQASTMH96 22202/16/2022   Lab Results  Component  Value Date   IRON 165 (H) 09/28/2020   TIBC 389 09/28/2020   FERRITIN 23 09/01/2019      Current Medications:   Current Outpatient Medications (Endocrine & Metabolic):    levothyroxine (SYNTHROID) 137 MCG tablet, Take  1 tablet  Daily  on an empty stomach with only water for 30 minutes & no Antacid meds, Calcium or Magnesium for 4 hours & avoid Biotin  Current Outpatient Medications (Cardiovascular):    bisoprolol-hydrochlorothiazide (ZIAC) 5-6.25 MG tablet, TAKE 1 TABLET BY MOUTH EVERY DAY FOR BLOOD PRESSURE   olmesartan (BENICAR) 40 MG tablet, Take  1 tablet  Daily  for BP   /  TAKE 1 TABLET DAILY FOR BLOOD PRESSURE  Current Outpatient Medications (Respiratory):    albuterol (VENTOLIN HFA) 108 (90 Base) MCG/ACT inhaler, Inhale 2 puffs into the lungs every 4 (four) hours as needed for wheezing or shortness of breath.   Budeson-Glycopyrrol-Formoterol (BREZTRI AEROSPHERE) 160-9-4.8 MCG/ACT AERO, Inhale 1 Pump into the lungs in the morning and at bedtime. (Patient taking differently: Inhale 1 puff into the lungs 2 (two) times daily as needed (COPD).)  levocetirizine (XYZAL) 5 MG tablet, Take 5 mg by mouth every evening.  Current Outpatient Medications (Analgesics):    acetaminophen (TYLENOL) 650 MG CR tablet, Take 650 mg by mouth 3 (three) times daily as needed for pain.   naproxen (NAPROSYN) 500 MG tablet, Take 1 tablet (500 mg total) by mouth 2 (two) times daily with a meal.   oxyCODONE-acetaminophen (PERCOCET) 5-325 MG tablet, Take 1 tablet by mouth every 4 (four) hours as needed (max 6 q).  Current Outpatient Medications (Hematological):    cyanocobalamin (,VITAMIN B-12,) 1000 MCG/ML injection, Inject 1039mg once weekly for 6 weeks, then once a month (Patient taking differently: Inject 1,000 mcg into the muscle every 30 (thirty) days.)   folic acid (FOLVITE) 1 MG tablet, Take 2 mg by mouth at bedtime.  Current Outpatient Medications (Other):    busPIRone (BUSPAR) 10 MG tablet,  Take  1 tablet  3 x /day for Anxiety   /  TAKE 1 TABLET BY MOUTH THREE TIMES A DAY **INS 30 DAY**   cyclobenzaprine (FLEXERIL) 10 MG tablet, Take 1 tablet (10 mg total) by mouth 3 (three) times daily as needed for muscle spasms.   escitalopram (LEXAPRO) 20 MG tablet, TAKE 1 TABLET BY MOUTH EVERY DAY   meclizine (ANTIVERT) 25 MG tablet, Take 1 tablet (25 mg total) by mouth 3 (three) times daily as needed for dizziness.   Menthol, Topical Analgesic, (BIOFREEZE ROLL-ON EX), Apply 1 application topically daily as needed (pain).   methotrexate 50 MG/2ML injection, Inject 150 mg into the muscle once a week.   ondansetron (ZOFRAN) 4 MG tablet, Take 1 tablet (4 mg total) by mouth every 8 (eight) hours as needed for nausea or vomiting.   OVER THE COUNTER MEDICATION, Apply 1 application topically 5 (five) times daily as needed (pain). CBD Cream   pantoprazole (PROTONIX) 20 MG tablet, TAKE 1 TABLET DAILY FOR INDIGESTION & HEARTBURN   phentermine (ADIPEX-P) 37.5 MG tablet, Take 1/2 to 1 tablet every Morning for Dieting & Weight Loss (Patient taking differently: Take 37.5 mg by mouth daily before breakfast.)   topiramate (TOPAMAX) 50 MG tablet, TAKE 1/2 TO 1 TABLET 2 TIMES A DAY AT SUPPERTIME & BEDTIME FOR DIETING & WEIGHT LOSS   triamcinolone cream (KENALOG) 0.5 %, Apply 1 application topically 2 (two) times daily. (Patient taking differently: Apply 1 application topically 2 (two) times daily as needed (irritation).)   Vitamin D, Ergocalciferol, (DRISDOL) 1.25 MG (50000 UNIT) CAPS capsule, TAKE 1 CAPSULE BY MOUTH 3 DAYS A WEEK FOR VITAMIN DEFICIENCY  Medical History:  Past Medical History:  Diagnosis Date   Allergy    Anemia    Anxiety    Arthritis    "knees, hands, ankles, ~ every joint" (04/08/2015)   Basal cell carcinoma    "face, hands, chest" (04/08/2015)   Chronic bronchitis ([place]    "get it pretty much q yr" (04/08/2015)   COPD (chronic obstructive pulmonary disease) (HCC)    Dysrhythmia     palpitations occ; SVT 09/2013 s/p adenosine   GERD (gastroesophageal reflux disease)    H/O hiatal hernia    Hx of cardiovascular stress test    Lexiscan Myoview 6/16: Ejection fraction is 60% and wall motion is normal. The study is normal. There is no scar or ischemia. This is a low risk scan.   Hyperlipidemia    Hypertension    Hypothyroidism    Palpitations    PONV (postoperative nausea and vomiting)    S/P total knee arthroplasty 02/15/2014  Sleep apnea    Thyroid disease    Vitamin D deficiency     Allergies Allergies  Allergen Reactions   Darvon [Propoxyphene Hcl] Anaphylaxis and Other (See Comments)    Airway swelling    Propoxyphene Anaphylaxis    Throat closes up   Doxycycline Nausea Only   Other     Any narcotic med but morphine makes pt nauseated    Tamiflu [Oseltamivir Phosphate] Hives and Other (See Comments)    Increased blood pressure    SURGICAL HISTORY She  has a past surgical history that includes Carpal tunnel release (Right, 1990); Tubal ligation (1987); Knee arthroscopy (Right, 2013); Bladder repair (1993); Endometrial ablation (2009); Excision basal cell carcinoma (2014); Partial knee arthroplasty (Right, 02/15/2014); Cardiac catheterization (N/A, 04/08/2015); Joint replacement; Incontinence surgery (2010); Knee arthroscopy; Cardiac surgery; Colonoscopy; Polypectomy; Colonoscopy with propofol (N/A, 11/25/2017); Cardiac catheterization (2017); Laparoscopic gastric sleeve resection (N/A, 05/26/2019); Splenectomy, total (05/26/2019); and Reverse shoulder arthroplasty (Right, 04/27/2021). FAMILY HISTORY Her family history includes Atrial fibrillation in her mother; Breast cancer in her paternal aunt; COPD in her father; Diabetes in her father; Emphysema in her father; Heart attack in her maternal grandmother; Heart disease in her maternal grandfather, paternal grandfather, and paternal grandmother; Hyperlipidemia in her maternal grandfather, paternal grandfather, and  paternal grandmother; Hypertension in her mother; Stroke in her maternal grandmother. SOCIAL HISTORY She  reports that she has been smoking cigarettes. She has a 45.00 pack-year smoking history. She has never used smokeless tobacco. She reports that she does not drink alcohol and does not use drugs.    Immunization History  Administered Date(s) Administered   Influenza,inj,quad, With Preservative 05/14/2019   Influenza-Unspecified 05/14/2015, 05/27/2017, 05/13/2018   Meningococcal B, OMV 06/19/2019   Meningococcal Mcv4o 06/19/2019   PFIZER(Purple Top)SARS-COV-2 Vaccination 10/23/2019, 11/18/2019, 06/02/2020   PPD Test 12/02/2013   Pneumococcal Conjugate-13 09/01/2019   Pneumococcal Polysaccharide-23 04/08/2012, 04/06/2020   Td 10/16/2004   Tdap 07/24/2017   Health Maintenance  Topic Date Due   Zoster Vaccines- Shingrix (1 of 2) Never done   PAP SMEAR-Modifier  10/24/2007   COVID-19 Vaccine (4 - Booster for Pfizer series) 07/28/2020   MAMMOGRAM  05/30/2023   TETANUS/TDAP  07/25/2027   COLONOSCOPY (Pts 45-63yr Insurance coverage will need to be confirmed)  11/26/2027   INFLUENZA VACCINE  Completed   Hepatitis C Screening  Completed   HIV Screening  Completed   HPV VACCINES  Aged Out   Colonoscopy: 11/2017, precancerous polyp, per Dr. NSilverio Decamp1 year recall ***  Mammogram: annually at ** Pap/ Pelvic: GYN 2010, OVERDUE  EOMB:TDHRCBDentist:Q 6-12 month, DUE Derm: Last OV 2020, DUE    *** Review of Systems  Constitutional:  Negative for malaise/fatigue and weight loss.  HENT:  Negative for hearing loss and tinnitus.   Eyes:  Negative for blurred vision and double vision.  Respiratory:  Negative for cough, sputum production, shortness of breath and wheezing.   Cardiovascular:  Negative for chest pain, palpitations, orthopnea, claudication, leg swelling and PND.  Gastrointestinal:  Negative for abdominal pain, blood in stool, constipation, diarrhea, heartburn, melena, nausea  and vomiting.  Genitourinary: Negative.   Musculoskeletal:  Negative for falls, joint pain and myalgias.  Skin:  Negative for rash.  Neurological:  Negative for dizziness, tingling, sensory change, weakness and headaches.  Endo/Heme/Allergies:  Negative for polydipsia.  Psychiatric/Behavioral: Negative.  Negative for depression, memory loss, substance abuse and suicidal ideas. The patient is not nervous/anxious and does not have insomnia.   All other systems  reviewed and are negative.   Physical Exam: There were no vitals taken for this visit. Wt Readings from Last 3 Encounters:  04/27/21 257 lb (116.6 kg)  04/18/21 257 lb (116.6 kg)  03/23/21 237 lb 12.8 oz (107.9 kg)   General Appearance: Well nourished, in no apparent distress. Eyes: PERRLA, EOMs, conjunctiva no swelling or erythema Sinuses: No Frontal/maxillary tenderness ENT/Mouth: Ext aud canals clear, TMs without erythema, bulging. No erythema, swelling, or exudate on post pharynx.  Tonsils not swollen or erythematous. Hearing normal.  Neck: Supple, thyroid normal.  Respiratory: Respiratory effort normal, BS equal bilaterally with wheezing left lower lung rhonchi, or stridor.  Cardio: RRR with no MRGs. Brisk peripheral pulses with 2+ edema.  Abdomen: Soft, + BS, obese Non tender, no guarding, rebound, hernias, masses. Lymphatics: Non tender without lymphadenopathy.  Musculoskeletal: Full ROM, 5/5 strength, antalgic gait Skin: Warm, dry without rashes, lesions, ecchymosis.  Neuro: Cranial nerves intact. Normal muscle tone, no cerebellar symptoms. Sensation intact.  Psych: Awake and oriented X 3, normal affect, Insight and Judgment appropriate.   MHW:KGSU Loletha Grayer, No ST changes   Izora Ribas, NP 1:50 PM [facility]

## 2021-12-18 ENCOUNTER — Ambulatory Visit (HOSPITAL_COMMUNITY)
Admission: RE | Admit: 2021-12-18 | Discharge: 2021-12-18 | Disposition: A | Discharge status: Home / Self Care | Payer: 59 | Source: Ambulatory Visit | Attending: Family Medicine | Admitting: Family Medicine

## 2021-12-18 ENCOUNTER — Encounter (HOSPITAL_COMMUNITY): Payer: Self-pay

## 2021-12-18 VITALS — BP 170/91 | HR 66 | Temp 99.4°F

## 2021-12-18 DIAGNOSIS — J0100 Acute maxillary sinusitis, unspecified: Secondary | ICD-10-CM | POA: Diagnosis not present

## 2021-12-18 DIAGNOSIS — R051 Acute cough: Secondary | ICD-10-CM

## 2021-12-18 DIAGNOSIS — I10 Essential (primary) hypertension: Secondary | ICD-10-CM | POA: Diagnosis not present

## 2021-12-18 MED ORDER — CEFDINIR 300 MG PO CAPS: 300.0000 mg | ORAL_CAPSULE | Freq: Two times a day (BID) | ORAL | 0 refills | Status: DC

## 2021-12-18 NOTE — Discharge Instructions (Signed)
Your blood pressure was noted to be elevated during your visit today. If you are currently taking medication for high blood pressure, please ensure you are taking this as directed. If you do not have a history of high blood pressure and your blood pressure remains persistently elevated, you may need to begin taking a medication at some point. You may return here within the next few days to recheck if unable to see your primary care provider or if you do not have a one. ? ?BP (!) 170/91 (BP Location: Right Arm)   Pulse 66   Temp 99.4 ?F (37.4 ?C) (Oral)   SpO2 94%  ? ?BP Readings from Last 3 Encounters:  ?12/18/21 (!) 170/91  ?04/27/21 (!) 149/81  ?04/18/21 (!) 154/96  ? ? ? ?

## 2021-12-18 NOTE — ED Triage Notes (Signed)
Pt presents with sinus pressure and headache for over a week.  ?

## 2021-12-20 NOTE — ED Provider Notes (Signed)
?[place] ? ? ?[id number] ?12/18/21 Arrival Time: 5284 ? ?ASSESSMENT & PLAN: ? ?1. Acute non-recurrent maxillary sinusitis   ?2. Acute cough   ?3. Elevated blood pressure reading in office with diagnosis of hypertension   ? ?Begin: ?Meds ordered this encounter  ?Medications  ? cefdinir (OMNICEF) 300 MG capsule  ?  Sig: Take 1 capsule (300 mg total) by mouth 2 (two) times daily.  ?  Dispense:  20 capsule  ?  Refill:  0  ? ?Discussed typical duration of symptoms. ?OTC symptom care as needed. ?Ensure adequate fluid intake and rest. ?Declines Rx cough med. ? ? ? ?Discharge Instructions   ? ?  ?Your blood pressure was noted to be elevated during your visit today. If you are currently taking medication for high blood pressure, please ensure you are taking this as directed. If you do not have a history of high blood pressure and your blood pressure remains persistently elevated, you may need to begin taking a medication at some point. You may return here within the next few days to recheck if unable to see your primary care provider or if you do not have a one. ? ?BP (!) 170/91 (BP Location: Right Arm)   Pulse 66   Temp 99.4 ?F (37.4 ?C) (Oral)   SpO2 94%  ? ?BP Readings from Last 3 Encounters:  ?12/18/21 (!) 170/91  ?04/27/21 (!) 149/81  ?04/18/21 (!) 154/96  ? ? ? ? ? ? ? ? Follow-up Information   ? ? Unk Pinto, MD .   ?Specialty: Internal Medicine ?Why: If worsening or failing to improve as anticipated. ?Contact information: ?[address] ?[address] ?[place] [address] ?[phone] ? ? ?  ?  ? ?  ?  ? ?  ? ? ?Reviewed expectations re: course of current medical issues. Questions answered. ?Outlined signs and symptoms indicating need for more acute intervention. ?Patient verbalized understanding. ?After Visit Summary given. ? ? ?SUBJECTIVE: ?History from: patient. ? ?Sherry Dennis is a 61 y.o. female who presents with complaint of nasal congestion, post-nasal drainage, and sinus pain. Onset  gradual,  > 1 week . Respiratory symptoms: none. Fever: mil d cough. Overall normal PO intake without n/v. OTC treatment: none reported. Seasonal allergies: unsure. ?History of frequent sinus infections: "usu once a year". No specific aggravating or alleviating factors reported. ? ?Social History  ? ?Tobacco Use  ?Smoking Status Every Day  ? Packs/day: 1.50  ? Years: 30.00  ? Pack years: 45.00  ? Types: Cigarettes  ?Smokeless Tobacco Never  ? ?Increased blood pressure noted today. Reports that she is treated for HTN. ? ?She reports taking medications as instructed, no chest pain on exertion, no dyspnea on exertion, no swelling of ankles, no orthostatic dizziness or lightheadedness, no orthopnea or paroxysmal nocturnal dyspnea, and no palpitations. ? ? ?OBJECTIVE: ? ?Vitals:  ? 12/18/21 1201  ?BP: (!) 170/91  ?Pulse: 66  ?Temp: 99.4 ?F (37.4 ?C)  ?TempSrc: Oral  ?SpO2: 94%  ?  ? ?General appearance: alert; no distress ?HEENT: nasal congestion; clear runny nose; throat irritation secondary to post-nasal drainage; bilateral maxillary tenderness to palpation; turbinates boggy ?Neck: supple without LAD; trachea midline ?Lungs: unlabored respirations, symmetrical air entry; cough: absent; no respiratory distress ?Skin: warm and dry ?Psychological: alert and cooperative; normal mood and affect ? ?Allergies  ?Allergen Reactions  ? Darvon [Propoxyphene Hcl] Anaphylaxis and Other (See Comments)  ?  Airway swelling ?  ? Propoxyphene Anaphylaxis  ?  Throat closes  up  ? Doxycycline Nausea Only  ? Other   ?  Any narcotic med but morphine makes pt nauseated   ? Tamiflu [Oseltamivir Phosphate] Hives and Other (See Comments)  ?  Increased blood pressure  ? ? ?Past Medical History:  ?Diagnosis Date  ? Allergy   ? Anemia   ? Anxiety   ? Arthritis   ? "knees, hands, ankles, ~ every joint" (04/08/2015)  ? Basal cell carcinoma   ? "face, hands, chest" (04/08/2015)  ? Chronic bronchitis ([place])   ? "get it pretty much q yr" (04/08/2015)  ?  COPD (chronic obstructive pulmonary disease) ([place])   ? Dysrhythmia   ? palpitations occ; SVT 09/2013 s/p adenosine  ? GERD (gastroesophageal reflux disease)   ? H/O hiatal hernia   ? Hx of cardiovascular stress test   ? Lexiscan Myoview 6/16: Ejection fraction is 60% and wall motion is normal. The study is normal. There is no scar or ischemia. This is a low risk scan.  ? Hyperlipidemia   ? Hypertension   ? Hypothyroidism   ? Palpitations   ? PONV (postoperative nausea and vomiting)   ? S/P total knee arthroplasty 02/15/2014  ? Sleep apnea   ? Thyroid disease   ? Vitamin D deficiency   ? ?Family History  ?Problem Relation Age of Onset  ? Hypertension Mother   ? Atrial fibrillation Mother   ? Diabetes Father   ? Emphysema Father   ? COPD Father   ? Breast cancer Paternal Aunt   ?     x 4 aunts,   ? Stroke Maternal Grandmother   ? Heart attack Maternal Grandmother   ? Hyperlipidemia Maternal Grandfather   ? Heart disease Maternal Grandfather   ? Hyperlipidemia Paternal Grandmother   ? Heart disease Paternal Grandmother   ? Hyperlipidemia Paternal Grandfather   ? Heart disease Paternal Grandfather   ? Colon cancer Neg Hx   ? Colon polyps Neg Hx   ? Esophageal cancer Neg Hx   ? Rectal cancer Neg Hx   ? Ulcerative colitis Neg Hx   ? Stomach cancer Neg Hx   ? ?Social History  ? ?Socioeconomic History  ? Marital status: Divorced  ?  Spouse name: Not on file  ? Number of children: 2  ? Years of education: Not on file  ? Highest education level: Not on file  ?Occupational History  ? Not on file  ?Tobacco Use  ? Smoking status: Every Day  ?  Packs/day: 1.50  ?  Years: 30.00  ?  Pack years: 45.00  ?  Types: Cigarettes  ? Smokeless tobacco: Never  ?Vaping Use  ? Vaping Use: Never used  ?Substance and Sexual Activity  ? Alcohol use: No  ? Drug use: No  ? Sexual activity: Not Currently  ?Other Topics Concern  ? Not on file  ?Social History Narrative  ? Not on file  ? ?Social Determinants of Health  ? ?Financial Resource Strain: Not  on file  ?Food Insecurity: Not on file  ?Transportation Needs: Not on file  ?Physical Activity: Not on file  ?Stress: Not on file  ?Social Connections: Not on file  ?Intimate Partner Violence: Not on file  ? ? ? ? ? ? ? ? ? ?  ?Vanessa Kick, MD ?12/20/21 [phone] ? ?

## 2021-12-22 ENCOUNTER — Encounter (HOSPITAL_COMMUNITY): Payer: Self-pay | Admitting: *Deleted

## 2021-12-27 ENCOUNTER — Other Ambulatory Visit: Payer: Self-pay | Admitting: Nurse Practitioner

## 2022-01-02 ENCOUNTER — Encounter: Payer: Self-pay | Admitting: Nurse Practitioner

## 2022-01-02 ENCOUNTER — Ambulatory Visit (INDEPENDENT_AMBULATORY_CARE_PROVIDER_SITE_OTHER): Payer: 59 | Admitting: Nurse Practitioner

## 2022-01-02 VITALS — BP 166/92 | HR 67 | Temp 97.7°F | Ht 64.5 in | Wt 242.0 lb

## 2022-01-02 DIAGNOSIS — E559 Vitamin D deficiency, unspecified: Secondary | ICD-10-CM

## 2022-01-02 DIAGNOSIS — I471 Supraventricular tachycardia, unspecified: Secondary | ICD-10-CM

## 2022-01-02 DIAGNOSIS — I700 Atherosclerosis of aorta: Secondary | ICD-10-CM

## 2022-01-02 DIAGNOSIS — J439 Emphysema, unspecified: Secondary | ICD-10-CM

## 2022-01-02 DIAGNOSIS — Z136 Encounter for screening for cardiovascular disorders: Secondary | ICD-10-CM

## 2022-01-02 DIAGNOSIS — Z79899 Other long term (current) drug therapy: Secondary | ICD-10-CM

## 2022-01-02 DIAGNOSIS — I10 Essential (primary) hypertension: Secondary | ICD-10-CM | POA: Diagnosis not present

## 2022-01-02 DIAGNOSIS — J449 Chronic obstructive pulmonary disease, unspecified: Secondary | ICD-10-CM | POA: Diagnosis not present

## 2022-01-02 DIAGNOSIS — Z1389 Encounter for screening for other disorder: Secondary | ICD-10-CM

## 2022-01-02 DIAGNOSIS — Z9081 Acquired absence of spleen: Secondary | ICD-10-CM

## 2022-01-02 DIAGNOSIS — Z9884 Bariatric surgery status: Secondary | ICD-10-CM

## 2022-01-02 DIAGNOSIS — E538 Deficiency of other specified B group vitamins: Secondary | ICD-10-CM

## 2022-01-02 DIAGNOSIS — Z0001 Encounter for general adult medical examination with abnormal findings: Secondary | ICD-10-CM

## 2022-01-02 DIAGNOSIS — Z87891 Personal history of nicotine dependence: Secondary | ICD-10-CM

## 2022-01-02 DIAGNOSIS — M069 Rheumatoid arthritis, unspecified: Secondary | ICD-10-CM

## 2022-01-02 DIAGNOSIS — G4733 Obstructive sleep apnea (adult) (pediatric): Secondary | ICD-10-CM

## 2022-01-02 DIAGNOSIS — Z0000 Encounter for general adult medical examination without abnormal findings: Secondary | ICD-10-CM | POA: Diagnosis not present

## 2022-01-02 DIAGNOSIS — F419 Anxiety disorder, unspecified: Secondary | ICD-10-CM

## 2022-01-02 DIAGNOSIS — E782 Mixed hyperlipidemia: Secondary | ICD-10-CM

## 2022-01-02 DIAGNOSIS — K635 Polyp of colon: Secondary | ICD-10-CM

## 2022-01-02 DIAGNOSIS — E079 Disorder of thyroid, unspecified: Secondary | ICD-10-CM

## 2022-01-02 NOTE — Progress Notes (Signed)
COMPLETE PHYSICAL  Assessment and Plan:   1. Encounter for general adult medical examination with abnormal findings Due Annually  - CBC with Differential/Platelet - Hemoglobin A1c  2. Aortic atherosclerosis ([place]) - per CT 08/31/2017 Control blood pressure, cholesterol, glucose, increase exercise.  Per CT 08/30/2017  - Lipid panel  3. PSVT (paroxysmal supraventricular tachycardia) (HCC) Controlled. No longer needs to follow with Cardiology. Will continue to monitor.   4. COPD GOLD 0/ former smoker at risk Has started back smoking. Continue Albuterol. Smoking cessation and counseling provided. Not ready to quit. Will continue to monitor.   5. Nocturnal hypoxemia due to emphysema (HCC) No recent events. Smoking cessation and counseling provided. Continue to monitor.  6. Rheumatoid arthritis, involving unspecified site, unspecified whether rheumatoid factor present ([place]) Stable. Continue medications; Methotrexate Continue to follow with Rheumatology. Continue to monitor.  7. Morbid obesity ([place]) Has lost 15 lb. Continue lifestyle modifications. Increase veggies, decrease carbs, sugars, starches. Stay active.  - COMPLETE METABOLIC PANEL WITH GFR  8. Mixed hyperlipidemia Controlled. Continue lifestyle modifications. Continue to monitor.  - COMPLETE METABOLIC PANEL WITH GFR  9. Thyroid disease Continue medications; levothyroxine 188mg daily half tablet Sat & Sun. Reminder to take on an empty stomach 30-660ms before first meal of the day. No antacid medications for 4 hours. Continue to monitor.  - TSH  10. Vitamin D deficiency Stable and at goal. Continue Vitamin D  11. OSA on CPAP Controlled. Continue CPAP Reminder to have tubes cleaned, exchanged.  12. Primary hypertension Controlled. Continue medications; Olmesartan, Bisoprolol-HCTZ,  - CBC with Differential/Platelet  13. Polyp of cecum Continue Colonoscopy Continue to monitor.  14.  Anxiety Controlled. Continue medications; Buspar, Lexapro  15. H/O splenectomy Continue to monitor.  16. S/P laparoscopic sleeve gastrectomy Continue weight loss. Down 15 lb. Lifestyle modifications, diet, exercise.  Continue to monitor  17. B12 deficiency  - Vitamin B12  18. Medication management All medications reviewed and discussed in detail. All questions and concerns addressed  - CBC with Differential/Platelet - COMPLETE METABOLIC PANEL WITH GFR - Lipid panel - TSH - Hemoglobin A1c - VITAMIN D 25 Hydroxy (Vit-D Deficiency, Fractures) - EKG 12-Lead - Vitamin B12 - Urinalysis, Routine w reflex microscopic  19. Screening, ischemic heart disease  - EKG 12-Lead  20. Screening for blood or protein in urine  - Urinalysis, Routine w reflex microscopic   Further disposition pending results if labs check today. Discussed med's effects and SE's.   Over 30 minutes of face to face interview, exam, counseling, chart review, and critical decision making was performed.   Continue diet and meds as discussed. Further disposition pending results of labs. Future Appointments  Date Time Provider [place]5/23/2024  3:00 PM CrDarrol JumpNP [name] None    HPI [age].o. female  presents for 3 month follow up with HTN, HLD, prediabetes and vitamin D and CPE  She is managing well with stress and anxiety. She has lost 15 lb and is continuing to feel well overall.   She has pain on the right side of her neck and follows with the pain clinic and she has an injection March 1st.  She is taking routine tylenol, using a heating pad and also Voltaren gel for this.  Has Oxycodone as well.  She continues to have intermittent abdominal tenderness s/p gastric sleeve.  RLQ RUQ tenderness and reports it feels heavy.  LBM was today, takes miralax daily with breakfast. Denies any cramping, bloating, nausea or vomiting.  She  had the gastric sleeve Oct 13th, highest weight was 273.  She has gone back to smoking, a few everyday.   Her blood pressure has been controlled at home, her BP is doing better with ziac 17m, Benicar 291mand lasix pill BUT she forgot her medications today, today their BP is BP: (!) 166/92   Patient has history of ablation for SVT with Dr. NiLajuana Ripplen 2016.  She has a history of RA.  Following with Dr. BeAmil Amennd last OV was 1009/6/04Due to complications of gastric sleeve she had a splenectomy BMI is Body mass index is 40.9 kg/m., she is working on diet and exercise. She has not met her fluid intake, she has met her protein intake.  Wt Readings from Last 3 Encounters:  01/02/22 242 lb (109.8 kg)  04/27/21 257 lb (116.6 kg)  04/18/21 257 lb (116.6 kg)   She does not workout.  She denies chest pain, shortness of breath, dizziness.  She is not on cholesterol medication, family history of MI, GM at 7227mom at 7762nd denies myalgias. Had recent Stress test 03/2019, normal Echo, did show LVH.  Her cholesterol is at goal. The cholesterol last visit was:   Lab Results  Component Value Date   CHOL 150 01/02/2022   HDL 50 01/02/2022   LDLCALC 85 01/02/2022   TRIG 66 01/02/2022   CHOLHDL 3.0 01/02/2022   She has been working on diet and exercise for prediabetes, and denies paresthesia of the feet, polydipsia and polyuria. Last A1C in the office was:  Lab Results  Component Value Date   HGBA1C 6.2 (H) 01/02/2022   Patient is on Vitamin D supplement, she is on 50,000, 3 days a week- not taking well.    Lab Results  Component Value Date   VD25OH 97 01/02/2022     She is on thyroid medication. Her medication is on 13739mdaily with water 1 hour before food. Lab Results  Component Value Date   TSH 0.99 01/02/2022  .  She has a B12 def, on B12 injections, she has taken in Dec and Jan.   Lab Results  Component Value Date   VITAMINB12 336 01/02/2022    Current Medications:   Current Outpatient Medications (Endocrine & Metabolic):     levothyroxine (SYNTHROID) 137 MCG tablet, Take  1 tablet  Daily  on an empty stomach with only water for 30 minutes & no Antacid meds, Calcium or Magnesium for 4 hours & avoid Biotin  Current Outpatient Medications (Cardiovascular):    bisoprolol-hydrochlorothiazide (ZIAC) 5-6.25 MG tablet, TAKE 1 TABLET BY MOUTH EVERY DAY FOR BLOOD PRESSURE   olmesartan (BENICAR) 40 MG tablet, Take  1 tablet  Daily  for BP   /  TAKE 1 TABLET DAILY FOR BLOOD PRESSURE  Current Outpatient Medications (Respiratory):    albuterol (VENTOLIN HFA) 108 (90 Base) MCG/ACT inhaler, Inhale 2 puffs into the lungs every 4 (four) hours as needed for wheezing or shortness of breath.   Dextromethorphan Polistirex (ROBITUSSIN 12 HOUR COUGH PO), Take by mouth.   levocetirizine (XYZAL) 5 MG tablet, Take 5 mg by mouth every evening.   Budeson-Glycopyrrol-Formoterol (BREZTRI AEROSPHERE) 160-9-4.8 MCG/ACT AERO, Inhale 1 Pump into the lungs in the morning and at bedtime. (Patient not taking: Reported on 01/02/2022)  Current Outpatient Medications (Analgesics):    acetaminophen (TYLENOL) 650 MG CR tablet, Take 650 mg by mouth 3 (three) times daily as needed for pain.   naproxen (NAPROSYN) 500 MG tablet, Take 1  tablet (500 mg total) by mouth 2 (two) times daily with a meal.   oxyCODONE-acetaminophen (PERCOCET) 5-325 MG tablet, Take 1 tablet by mouth every 4 (four) hours as needed (max 6 q). (Patient not taking: Reported on 01/02/2022)  Current Outpatient Medications (Hematological):    folic acid (FOLVITE) 1 MG tablet, Take 2 mg by mouth at bedtime.   cyanocobalamin (,VITAMIN B-12,) 1000 MCG/ML injection, Inject 1067mg once weekly for 6 weeks, then once a month (Patient not taking: Reported on 01/02/2022)  Current Outpatient Medications (Other):    busPIRone (BUSPAR) 10 MG tablet, Take  1 tablet  3 x /day for Anxiety   /  TAKE 1 TABLET BY MOUTH THREE TIMES A DAY **INS 30 DAY**   cefdinir (OMNICEF) 300 MG capsule, Take 1 capsule (300 mg  total) by mouth 2 (two) times daily.   cyclobenzaprine (FLEXERIL) 10 MG tablet, Take 1 tablet (10 mg total) by mouth 3 (three) times daily as needed for muscle spasms.   escitalopram (LEXAPRO) 20 MG tablet, TAKE 1 TABLET BY MOUTH EVERY DAY   meclizine (ANTIVERT) 25 MG tablet, Take 1 tablet (25 mg total) by mouth 3 (three) times daily as needed for dizziness.   Menthol, Topical Analgesic, (BIOFREEZE ROLL-ON EX), Apply 1 application topically daily as needed (pain).   methotrexate 50 MG/2ML injection, Inject 150 mg into the muscle once a week.   ondansetron (ZOFRAN) 4 MG tablet, Take 1 tablet (4 mg total) by mouth every 8 (eight) hours as needed for nausea or vomiting.   OVER THE COUNTER MEDICATION, Apply 1 application topically 5 (five) times daily as needed (pain). CBD Cream   pantoprazole (PROTONIX) 20 MG tablet, TAKE 1 TABLET DAILY FOR INDIGESTION & HEARTBURN   triamcinolone cream (KENALOG) 0.5 %, Apply 1 application topically 2 (two) times daily. (Patient taking differently: Apply 1 application. topically 2 (two) times daily as needed (irritation).)   Vitamin D, Ergocalciferol, (DRISDOL) 1.25 MG (50000 UNIT) CAPS capsule, TAKE 1 CAPSULE BY MOUTH 3 DAYS A WEEK FOR VITAMIN DEFICIENCY   phentermine (ADIPEX-P) 37.5 MG tablet, Take 1/2 to 1 tablet every Morning for Dieting & Weight Loss (Patient not taking: Reported on 01/02/2022)   topiramate (TOPAMAX) 50 MG tablet, TAKE 1/2 TO 1 TABLET 2 TIMES A DAY AT SUPPERTIME & BEDTIME FOR DIETING & WEIGHT LOSS (Patient not taking: Reported on 01/02/2022)  Medical History:  Past Medical History:  Diagnosis Date   Allergy    Anemia    Anxiety    Arthritis    "knees, hands, ankles, ~ every joint" (04/08/2015)   Basal cell carcinoma    "face, hands, chest" (04/08/2015)   [place]    "get it pretty much q yr" (04/08/2015)   COPD (chronic obstructive pulmonary disease) (HCC)    Dysrhythmia    palpitations occ; SVT 09/2013 s/p adenosine   GERD  (gastroesophageal reflux disease)    H/O hiatal hernia    Hx of cardiovascular stress test    Lexiscan Myoview 6/16: Ejection fraction is 60% and wall motion is normal. The study is normal. There is no scar or ischemia. This is a low risk scan.   Hyperlipidemia    Hypertension    Hypothyroidism    Palpitations    PONV (postoperative nausea and vomiting)    S/P total knee arthroplasty 02/15/2014   Sleep apnea    Thyroid disease    Vitamin D deficiency     Allergies Allergies  Allergen Reactions   Darvon [Propoxyphene Hcl]  Anaphylaxis and Other (See Comments)    Airway swelling    Propoxyphene Anaphylaxis    Throat closes up   Doxycycline Nausea Only   Other     Any narcotic med but morphine makes pt nauseated    Tamiflu [Oseltamivir Phosphate] Hives and Other (See Comments)    Increased blood pressure    SURGICAL HISTORY She  has a past surgical history that includes Carpal tunnel release (Right, 1990); Tubal ligation (1987); Knee arthroscopy (Right, 2013); Bladder repair (1993); Endometrial ablation (2009); Excision basal cell carcinoma (2014); Partial knee arthroplasty (Right, 02/15/2014); Cardiac catheterization (N/A, 04/08/2015); Joint replacement; Incontinence surgery (2010); Knee arthroscopy; Cardiac surgery; Colonoscopy; Polypectomy; Colonoscopy with propofol (N/A, 11/25/2017); Cardiac catheterization (2017); Laparoscopic gastric sleeve resection (N/A, 05/26/2019); Splenectomy, total (05/26/2019); and Reverse shoulder arthroplasty (Right, 04/27/2021). FAMILY HISTORY Her family history includes Atrial fibrillation in her mother; Breast cancer in her paternal aunt; COPD in her father; Diabetes in her father; Emphysema in her father; Heart attack in her maternal grandmother; Heart disease in her maternal grandfather, paternal grandfather, and paternal grandmother; Hyperlipidemia in her maternal grandfather, paternal grandfather, and paternal grandmother; Hypertension in her mother;  Stroke in her maternal grandmother. SOCIAL HISTORY She  reports that she has been smoking cigarettes. She has a 45.00 pack-year smoking history. She has never used smokeless tobacco. She reports that she does not drink alcohol and does not use drugs.   Immunization History  Administered Date(s) Administered   Influenza,inj,quad, With Preservative 05/14/2019   Influenza-Unspecified 05/14/2015, 05/27/2017, 05/13/2018   Meningococcal B, OMV 06/19/2019   Meningococcal Mcv4o 06/19/2019   PFIZER(Purple Top)SARS-COV-2 Vaccination 10/23/2019, 11/18/2019, 06/02/2020   PPD Test 12/02/2013   Pneumococcal Conjugate-13 09/01/2019   Pneumococcal Polysaccharide-23 04/08/2012, 04/06/2020   Td 10/16/2004   Tdap 07/24/2017   MAINTENANCE: Colonoscopy: 11/2017 DUE EGD: 2011 Mammogram: Due 05/2021 BMD: N/A Pap/ Pelvic: GYN 2010, OVERDUE QIO:NGEXBM Dentist:Q 6-12 month, DUE Derm: Last OV 2020, DUE  CXR:09/25/13   IMMUNIZATIONS: TD/Tdap: 2018 Pneumovax:04/08/12 Zostavax:N/A Influenza: 2022  Dermatology for skin check:  Due. Had two skin cancers removed 2 no ago.  Has had BCC since 61 yo  Review of Systems  Constitutional: Negative for chills, diaphoresis, fever, malaise/fatigue and weight loss.  HENT: Negative.   Eyes: Negative.   Respiratory: Negative for cough, shortness of breath and wheezing.   Cardiovascular: Negative for chest pain, palpitations, orthopnea, claudication, leg swelling and PND.  Gastrointestinal: Negative.   Genitourinary: Negative.   Musculoskeletal: Positive for back pain. Negative for falls, joint pain, myalgias and neck pain.  Skin: Negative.   Neurological: Negative for dizziness, tingling, tremors, sensory change, speech change, focal weakness, seizures, loss of consciousness, weakness and headaches.  Psychiatric/Behavioral: Negative for depression, hallucinations, memory loss, substance abuse and suicidal ideas. The patient is not nervous/anxious and does not have  insomnia.     Physical Exam: BP (!) 166/92   Pulse 67   Temp 97.7 F (36.5 C)   Ht 5' 4.5" (1.638 m)   Wt 242 lb (109.8 kg)   SpO2 93%   BMI 40.90 kg/m  Wt Readings from Last 3 Encounters:  01/02/22 242 lb (109.8 kg)  04/27/21 257 lb (116.6 kg)  04/18/21 257 lb (116.6 kg)   General Appearance: Well nourished, in no apparent distress. Eyes: PERRLA, EOMs, conjunctiva no swelling or erythema Sinuses: No Frontal/maxillary tenderness ENT/Mouth: Ext aud canals clear, TMs without erythema, bulging. No erythema, swelling, or exudate on post pharynx.  Tonsils not swollen or erythematous. Hearing normal.  Neck:  Supple, thyroid normal.  Respiratory: Respiratory effort normal, BS equal bilaterally with wheezing left lower lung rhonchi, or stridor.  Cardio: RRR with no MRGs. Brisk peripheral pulses with 2+ edema.  Abdomen: Soft, + BS, obese Non tender, no guarding, rebound, hernias, masses. Lymphatics: Non tender without lymphadenopathy.  Musculoskeletal: Full ROM, 5/5 strength, antalgic gait Skin: Warm, dry without rashes, lesions, ecchymosis.  Neuro: Cranial nerves intact. Normal muscle tone, no cerebellar symptoms. Sensation intact.  Psych: Awake and oriented X 3, normal affect, Insight and Judgment appropriate.   EKG: NSR No ST changes   Darrol Jump, NP [facility] 01/08/2022  10:38 PM

## 2022-01-02 NOTE — Patient Instructions (Signed)
COPD and Physical Activity ?Chronic obstructive pulmonary disease (COPD) is a long-term, or chronic, condition that affects the lungs. COPD is a general term that can be used to describe many problems that cause inflammation of the lungs and limit airflow. These conditions include chronic bronchitis and emphysema. ?The main symptom of COPD is shortness of breath, which makes it harder to do even simple tasks. This can also make it harder to exercise and stay active. Talk with your health care provider about treatments to help you breathe better and actions you can take to prevent breathing problems during physical activity. ?What are the benefits of exercising when you have COPD? ?Exercising regularly is an important part of a healthy lifestyle. You can still exercise and do physical activities even though you have COPD. Exercise and physical activity improve your shortness of breath by increasing blood flow (circulation). This causes your heart to pump more oxygen through your body. Moderate exercise can: ?Improve oxygen use. ?Increase your energy level. ?Help with shortness of breath. ?Strengthen your breathing muscles. ?Improve heart health. ?Help with sleep. ?Improve your self-esteem and feelings of self-worth. ?Lower depression, stress, and anxiety. ?Exercise can benefit everyone with COPD. The severity of your disease may affect how hard you can exercise, especially at first, but everyone can benefit. Talk with your health care provider about how much exercise is safe for you, and which activities and exercises are safe for you. ?What actions can I take to prevent breathing problems during physical activity? ?Sign up for a pulmonary rehabilitation program. This type of program may include: ?Education about lung diseases. ?Exercise classes that teach you how to exercise and be more active while improving your breathing. This usually involves: ?Exercise using your lower extremities, such as a stationary  bicycle. ?About 30 minutes of exercise, 2 to 5 times per week, for 6 to 12 weeks. ?Strength training, such as push-ups or leg lifts. ?Nutrition education. ?Group classes in which you can talk with others who also have COPD and learn ways to manage stress. ?If you use an oxygen tank, you should use it while you exercise. Work with your health care provider to adjust your oxygen for your physical activity. Your resting flow rate is different from your flow rate during physical activity. ?How to manage your breathing while exercising ?While you are exercising: ?Take slow breaths. ?Pace yourself, and do nottry to go too fast. ?Purse your lips while breathing out. Pursing your lips is similar to a kissing or whistling position. ?If doing exercise that uses a quick burst of effort, such as weight lifting: ?Breathe in before starting the exercise. ?Breathe out during the hardest part of the exercise, such as raising the weights. ?Where to find support ?You can find support for exercising with COPD from: ?Your health care provider. ?A pulmonary rehabilitation program. ?Your local health department or community health programs. ?Support groups, either online or in-person. Your health care provider may be able to recommend support groups. ?Where to find more information ?You can find more information about exercising with COPD from: ?American Lung Association: lung.org ?COPD Foundation: copdfoundation.org ?Contact a health care provider if: ?Your symptoms get worse. ?You have nausea. ?You have a fever. ?You want to start a new exercise program or a new activity. ?Get help right away if: ?You have chest pain. ?You cannot breathe. ?These symptoms may represent a serious problem that is an emergency. Do not wait to see if the symptoms will go away. Get medical help right away. Call   your local emergency services (911 in the [place] Do not drive yourself to the hospital. ?Summary ?COPD is a general term that can be used to describe  many different lung problems that cause lung inflammation and limit airflow. This includes chronic bronchitis and emphysema. ?Exercise and physical activity improve your shortness of breath by increasing blood flow (circulation). This causes your heart to provide more oxygen to your body. ?Contact your health care provider before starting any exercise program or new activity. Ask your health care provider what exercises and activities are safe for you. ?This information is not intended to replace advice given to you by your health care provider. Make sure you discuss any questions you have with your health care provider. ?Document Revised: 06/07/2020 Document Reviewed: 06/07/2020 ?Elsevier Patient Education ? 2023 [employer] ? ?

## 2022-01-03 ENCOUNTER — Other Ambulatory Visit: Payer: Self-pay | Admitting: Nurse Practitioner

## 2022-01-03 DIAGNOSIS — D509 Iron deficiency anemia, unspecified: Secondary | ICD-10-CM

## 2022-01-04 ENCOUNTER — Encounter: Payer: Self-pay | Admitting: Nurse Practitioner

## 2022-01-04 LAB — TEST AUTHORIZATION

## 2022-01-04 LAB — COMPLETE METABOLIC PANEL WITH GFR
AG Ratio: 1.5 (calc) (ref 1.0–2.5)
ALT: 10 U/L (ref 6–29)
AST: 12 U/L (ref 10–35)
Albumin: 4 g/dL (ref 3.6–5.1)
Alkaline phosphatase (APISO): 108 U/L (ref 37–153)
BUN: 15 mg/dL (ref 7–25)
CO2: 29 mmol/L (ref 20–32)
Calcium: 9.4 mg/dL (ref 8.6–10.4)
Chloride: 103 mmol/L (ref 98–110)
Creat: 0.77 mg/dL (ref 0.50–1.03)
Globulin: 2.6 g/dL (calc) (ref 1.9–3.7)
Glucose, Bld: 94 mg/dL (ref 65–99)
Potassium: 4.3 mmol/L (ref 3.5–5.3)
Sodium: 139 mmol/L (ref 135–146)
Total Bilirubin: 0.2 mg/dL (ref 0.2–1.2)
Total Protein: 6.6 g/dL (ref 6.1–8.1)
eGFR: 89 mL/min/{1.73_m2} (ref >=60)

## 2022-01-04 LAB — IRON,TIBC AND FERRITIN PANEL
%SAT: 6 % (calc) — ABNORMAL LOW (ref 16–45)
Ferritin: 14 ng/mL — ABNORMAL LOW (ref 16–232)
Iron: 25 ug/dL — ABNORMAL LOW (ref 45–160)
TIBC: 436 mcg/dL (calc) (ref 250–450)

## 2022-01-04 LAB — URINALYSIS, ROUTINE W REFLEX MICROSCOPIC
Bacteria, UA: NONE SEEN /HPF
Bilirubin Urine: NEGATIVE
Glucose, UA: NEGATIVE
Hgb urine dipstick: NEGATIVE
Hyaline Cast: NONE SEEN /LPF
Ketones, ur: NEGATIVE
Nitrite: NEGATIVE
Protein, ur: NEGATIVE
RBC / HPF: NONE SEEN /HPF (ref 0–2)
Specific Gravity, Urine: 1.005 (ref 1.001–1.035)
pH: 6.5 (ref 5.0–8.0)

## 2022-01-04 LAB — LIPID PANEL
Cholesterol: 150 mg/dL (ref <200)
HDL: 50 mg/dL (ref >=50)
LDL Cholesterol (Calc): 85 mg/dL (calc)
Non-HDL Cholesterol (Calc): 100 mg/dL (calc) (ref <130)
Total CHOL/HDL Ratio: 3 (calc) (ref <5.0)
Triglycerides: 66 mg/dL (ref <150)

## 2022-01-04 LAB — CBC WITH DIFFERENTIAL/PLATELET
Absolute Monocytes: 915 cells/uL (ref 200–950)
Basophils Absolute: 41 cells/uL (ref 0–200)
Basophils Relative: 0.5 %
Eosinophils Absolute: 138 cells/uL (ref 15–500)
Eosinophils Relative: 1.7 %
HCT: 33.6 % — ABNORMAL LOW (ref 35.0–45.0)
Hemoglobin: 11.1 g/dL — ABNORMAL LOW (ref 11.7–15.5)
Lymphs Abs: 2624 cells/uL (ref 850–3900)
MCH: 31 pg (ref 27.0–33.0)
MCHC: 33 g/dL (ref 32.0–36.0)
MCV: 93.9 fL (ref 80.0–100.0)
MPV: 10.5 fL (ref 7.5–12.5)
Monocytes Relative: 11.3 %
Neutro Abs: 4382 cells/uL (ref 1500–7800)
Neutrophils Relative %: 54.1 %
Platelets: 373 10*3/uL (ref 140–400)
RBC: 3.58 10*6/uL — ABNORMAL LOW (ref 3.80–5.10)
RDW: 13.1 % (ref 11.0–15.0)
Total Lymphocyte: 32.4 %
WBC: 8.1 10*3/uL (ref 3.8–10.8)

## 2022-01-04 LAB — VITAMIN B12: Vitamin B-12: 336 pg/mL (ref 200–1100)

## 2022-01-04 LAB — HEMOGLOBIN A1C
Hgb A1c MFr Bld: 6.2 % of total Hgb — ABNORMAL HIGH (ref <5.7)
Mean Plasma Glucose: 131 mg/dL
eAG (mmol/L): 7.3 mmol/L

## 2022-01-04 LAB — VITAMIN D 25 HYDROXY (VIT D DEFICIENCY, FRACTURES): Vit D, 25-Hydroxy: 97 ng/mL (ref 30–100)

## 2022-01-04 LAB — TSH: TSH: 0.99 mIU/L (ref 0.40–4.50)

## 2022-01-10 ENCOUNTER — Other Ambulatory Visit: Payer: Self-pay | Admitting: Nurse Practitioner

## 2022-01-10 ENCOUNTER — Encounter: Payer: Self-pay | Admitting: Nurse Practitioner

## 2022-01-10 MED ORDER — BISOPROLOL-HYDROCHLOROTHIAZIDE 10-6.25 MG PO TABS: ORAL_TABLET | ORAL | 1 refills | Status: DC

## 2022-01-23 ENCOUNTER — Other Ambulatory Visit: Payer: Self-pay | Admitting: Internal Medicine

## 2022-01-26 ENCOUNTER — Ambulatory Visit (INDEPENDENT_AMBULATORY_CARE_PROVIDER_SITE_OTHER): Payer: 59

## 2022-01-26 ENCOUNTER — Ambulatory Visit: Payer: 59 | Admitting: Podiatry

## 2022-01-26 DIAGNOSIS — M7751 Other enthesopathy of right foot: Secondary | ICD-10-CM | POA: Diagnosis not present

## 2022-01-26 DIAGNOSIS — M7750 Other enthesopathy of unspecified foot: Secondary | ICD-10-CM | POA: Diagnosis not present

## 2022-01-26 DIAGNOSIS — M25571 Pain in right ankle and joints of right foot: Secondary | ICD-10-CM

## 2022-01-26 DIAGNOSIS — M25572 Pain in left ankle and joints of left foot: Secondary | ICD-10-CM

## 2022-01-26 DIAGNOSIS — M7752 Other enthesopathy of left foot: Secondary | ICD-10-CM

## 2022-01-26 NOTE — Progress Notes (Unsigned)
ankl

## 2022-01-26 NOTE — Patient Instructions (Signed)
.tfc  Achilles Tendinitis  with Rehab Achilles tendinitis is a disorder of the Achilles tendon. The Achilles tendon connects the large calf muscles (Gastrocnemius and Soleus) to the heel bone (calcaneus). This tendon is sometimes called the heel cord. It is important for pushing-off and standing on your toes and is important for walking, running, or jumping. Tendinitis is often caused by overuse and repetitive microtrauma. SYMPTOMS Pain, tenderness, swelling, warmth, and redness may occur over the Achilles tendon even at rest. Pain with pushing off, or flexing or extending the ankle. Pain that is worsened after or during activity. CAUSES  Overuse sometimes seen with rapid increase in exercise programs or in sports requiring running and jumping. Poor physical conditioning (strength and flexibility or endurance). Running sports, especially training running down hills. Inadequate warm-up before practice or play or failure to stretch before participation. Injury to the tendon. PREVENTION  Warm up and stretch before practice or competition. Allow time for adequate rest and recovery between practices and competition. Keep up conditioning. Keep up ankle and leg flexibility. Improve or keep muscle strength and endurance. Improve cardiovascular fitness. Use proper technique. Use proper equipment (shoes, skates). To help prevent recurrence, taping, protective strapping, or an adhesive bandage may be recommended for several weeks after healing is complete. PROGNOSIS  Recovery may take weeks to several months to heal. Longer recovery is expected if symptoms have been prolonged. Recovery is usually quicker if the inflammation is due to a direct blow as compared with overuse or sudden strain. RELATED COMPLICATIONS  Healing time will be prolonged if the condition is not correctly treated. The injury must be given plenty of time to heal. Symptoms can reoccur if activity is resumed too  soon. Untreated, tendinitis may increase the risk of tendon rupture requiring additional time for recovery and possibly surgery. TREATMENT  The first treatment consists of rest anti-inflammatory medication, and ice to relieve the pain. Stretching and strengthening exercises after resolution of pain will likely help reduce the risk of recurrence. Referral to a physical therapist or athletic trainer for further evaluation and treatment may be helpful. A walking boot or cast may be recommended to rest the Achilles tendon. This can help break the cycle of inflammation and microtrauma. Arch supports (orthotics) may be prescribed or recommended by your caregiver as an adjunct to therapy and rest. Surgery to remove the inflamed tendon lining or degenerated tendon tissue is rarely necessary and has shown less than predictable results. MEDICATION  Nonsteroidal anti-inflammatory medications, such as aspirin and ibuprofen, may be used for pain and inflammation relief. Do not take within 7 days before surgery. Take these as directed by your caregiver. Contact your caregiver immediately if any bleeding, stomach upset, or signs of allergic reaction occur. Other minor pain relievers, such as acetaminophen, may also be used. Pain relievers may be prescribed as necessary by your caregiver. Do not take prescription pain medication for longer than 4 to 7 days. Use only as directed and only as much as you need. Cortisone injections are rarely indicated. Cortisone injections may weaken tendons and predispose to rupture. It is better to give the condition more time to heal than to use them. HEAT AND COLD Cold is used to relieve pain and reduce inflammation for acute and chronic Achilles tendinitis. Cold should be applied for 10 to 15 minutes every 2 to 3 hours for inflammation and pain and immediately after any activity that aggravates your symptoms. Use ice packs or an ice massage. Heat may be used before  performing  stretching and strengthening activities prescribed by your caregiver. Use a heat pack or a warm soak. SEEK MEDICAL CARE IF: Symptoms get worse or do not improve in 2 weeks despite treatment. New, unexplained symptoms develop. Drugs used in treatment may produce side effects.  EXERCISES:  RANGE OF MOTION (ROM) AND STRETCHING EXERCISES - Achilles Tendinitis  These exercises may help you when beginning to rehabilitate your injury. Your symptoms may resolve with or without further involvement from your physician, physical therapist or athletic trainer. While completing these exercises, remember:  Restoring tissue flexibility helps normal motion to return to the joints. This allows healthier, less painful movement and activity. An effective stretch should be held for at least 30 seconds. A stretch should never be painful. You should only feel a gentle lengthening or release in the stretched tissue.  STRETCH  Gastroc, Standing  Place hands on wall. Extend right / left leg, keeping the front knee somewhat bent. Slightly point your toes inward on your back foot. Keeping your right / left heel on the floor and your knee straight, shift your weight toward the wall, not allowing your back to arch. You should feel a gentle stretch in the right / left calf. Hold this position for 10 seconds. Repeat 3 times. Complete this stretch 2 times per day.  STRETCH  Soleus, Standing  Place hands on wall. Extend right / left leg, keeping the other knee somewhat bent. Slightly point your toes inward on your back foot. Keep your right / left heel on the floor, bend your back knee, and slightly shift your weight over the back leg so that you feel a gentle stretch deep in your back calf. Hold this position for 10 seconds. Repeat 3 times. Complete this stretch 2 times per day.  STRETCH  Gastrocsoleus, Standing  Note: This exercise can place a lot of stress on your foot and ankle. Please complete this exercise only  if specifically instructed by your caregiver.  Place the ball of your right / left foot on a step, keeping your other foot firmly on the same step. Hold on to the wall or a rail for balance. Slowly lift your other foot, allowing your body weight to press your heel down over the edge of the step. You should feel a stretch in your right / left calf. Hold this position for 10 seconds. Repeat this exercise with a slight bend in your knee. Repeat 3 times. Complete this stretch 2 times per day.   STRENGTHENING EXERCISES - Achilles Tendinitis These exercises may help you when beginning to rehabilitate your injury. They may resolve your symptoms with or without further involvement from your physician, physical therapist or athletic trainer. While completing these exercises, remember:  Muscles can gain both the endurance and the strength needed for everyday activities through controlled exercises. Complete these exercises as instructed by your physician, physical therapist or athletic trainer. Progress the resistance and repetitions only as guided. You may experience muscle soreness or fatigue, but the pain or discomfort you are trying to eliminate should never worsen during these exercises. If this pain does worsen, stop and make certain you are following the directions exactly. If the pain is still present after adjustments, discontinue the exercise until you can discuss the trouble with your clinician.  STRENGTH - Plantar-flexors  Sit with your right / left leg extended. Holding onto both ends of a rubber exercise band/tubing, loop it around the ball of your foot. Keep a slight tension in the  band. Slowly push your toes away from you, pointing them downward. Hold this position for 10 seconds. Return slowly, controlling the tension in the band/tubing. Repeat 3 times. Complete this exercise 2 times per day.   STRENGTH - Plantar-flexors  Stand with your feet shoulder width apart. Steady yourself with a  wall or table using as little support as needed. Keeping your weight evenly spread over the width of your feet, rise up on your toes.* Hold this position for 10 seconds. Repeat 3 times. Complete this exercise 2 times per day.  *If this is too easy, shift your weight toward your right / left leg until you feel challenged. Ultimately, you may be asked to do this exercise with your right / left foot only.  STRENGTH  Plantar-flexors, Eccentric  Note: This exercise can place a lot of stress on your foot and ankle. Please complete this exercise only if specifically instructed by your caregiver.  Place the balls of your feet on a step. With your hands, use only enough support from a wall or rail to keep your balance. Keep your knees straight and rise up on your toes. Slowly shift your weight entirely to your right / left toes and pick up your opposite foot. Gently and with controlled movement, lower your weight through your right / left foot so that your heel drops below the level of the step. You will feel a slight stretch in the back of your calf at the end position. Use the healthy leg to help rise up onto the balls of both feet, then lower weight only on the right / left leg again. Build up to 15 repetitions. Then progress to 3 consecutive sets of 15 repetitions.* After completing the above exercise, complete the same exercise with a slight knee bend (about 30 degrees). Again, build up to 15 repetitions. Then progress to 3 consecutive sets of 15 repetitions.* Perform this exercise 2 times per day.  *When you easily complete 3 sets of 15, your physician, physical therapist or athletic trainer may advise you to add resistance by wearing a backpack filled with additional weight.  STRENGTH - Plantar Flexors, Seated  Sit on a chair that allows your feet to rest flat on the ground. If necessary, sit at the edge of the chair. Keeping your toes firmly on the ground, lift your right / left heel as far as you  can without increasing any discomfort in your ankle. Repeat 3 times. Complete this exercise 2 times a day.

## 2022-01-29 NOTE — Progress Notes (Signed)
Subjective:   Patient ID: Sherry Dennis, female   DOB: 61 y.o.   MRN: [id number]   HPI 61 year old female presents the office today for concerns of bilateral ankle discomfort.  She has burning, throbbing and achiness to the ankles.  The pain is worse at nighttime [place] for several years.  On the left side she has a remote history of torn ligaments in her ankle from when she twisted her foot.  No injury in the right foot.  She does have rheumatoid and she is on medication for this.  No recent treatment for her ankles otherwise.  No other concerns.   Review of Systems  All other systems reviewed and are negative.  Past Medical History:  Diagnosis Date   Allergy    Anemia    Anxiety    Arthritis    "knees, hands, ankles, ~ every joint" (04/08/2015)   Basal cell carcinoma    "face, hands, chest" (04/08/2015)   Chronic bronchitis ([place])    "get it pretty much q yr" (04/08/2015)   COPD (chronic obstructive pulmonary disease) ([facility])    Dysrhythmia    palpitations occ; SVT 09/2013 s/p adenosine   GERD (gastroesophageal reflux disease)    H/O hiatal hernia    Hx of cardiovascular stress test    Lexiscan Myoview 6/16: Ejection fraction is 60% and wall motion is normal. The study is normal. There is no scar or ischemia. This is a low risk scan.   Hyperlipidemia    Hypertension    Hypothyroidism    Palpitations    PONV (postoperative nausea and vomiting)    S/P total knee arthroplasty 02/15/2014   Sleep apnea    Thyroid disease    Vitamin D deficiency     Past Surgical History:  Procedure Laterality Date   BASAL CELL CARCINOMA EXCISION  2014   "off my chest"   [place]   "lasered the holes shut"   CARDIAC CATHETERIZATION  2017   CARDIAC SURGERY     CARPAL TUNNEL RELEASE Right 1990   COLONOSCOPY     COLONOSCOPY WITH PROPOFOL N/A 11/25/2017   Procedure: COLONOSCOPY WITH PROPOFOL;  Surgeon: Mauri Pole, MD;  Location: [facility];  Service: Endoscopy;  Laterality:  N/A;   ELECTROPHYSIOLOGIC STUDY N/A 04/08/2015   Procedure: SVT Ablation;  Surgeon: Evans Lance, MD;  Location: [facility];  Service: Cardiovascular;  Laterality: N/A;   ENDOMETRIAL ABLATION  2009   INCONTINENCE SURGERY  2010   JOINT REPLACEMENT     KNEE ARTHROSCOPY Right 2013   KNEE ARTHROSCOPY     LAPAROSCOPIC GASTRIC SLEEVE RESECTION N/A 05/26/2019   Procedure: LAPAROSCOPIC GASTRIC SLEEVE RESECTION, Upper Endo, ERAS Pathway;  Surgeon: Greer Pickerel, MD;  Location: [facility];  Service: General;  Laterality: N/A;   PARTIAL KNEE ARTHROPLASTY Right 02/15/2014   Procedure: RIGHT UNICOMPARTMENTAL KNEE (MEDIAL COMPARTMENT);  Surgeon: Vickey Huger, MD;  Location: [facility];  Service: Orthopedics;  Laterality: Right;   POLYPECTOMY     REVERSE SHOULDER ARTHROPLASTY Right 04/27/2021   Procedure: REVERSE SHOULDER ARTHROPLASTY;  Surgeon: Justice Britain, MD;  Location: [facility];  Service: Orthopedics;  Laterality: Right;  124mn   SPLENECTOMY, TOTAL  05/26/2019   Procedure: SPLENECTOMY;  Surgeon: WGreer Pickerel MD;  Location: [facility];  Service: General;;   TUBAL LIGATION  1987     Current Outpatient Medications:    acetaminophen (TYLENOL) 650 MG CR tablet, Take 650 mg by mouth 3 (three) times daily as needed  for pain., Disp: , Rfl:    albuterol (VENTOLIN HFA) 108 (90 Base) MCG/ACT inhaler, Inhale 2 puffs into the lungs every 4 (four) hours as needed for wheezing or shortness of breath., Disp: 18 g, Rfl: 2   bisoprolol-hydrochlorothiazide (ZIAC) 10-6.25 MG tablet, Take 1 tablet every Morning for BP, Disp: 90 tablet, Rfl: 1   Budeson-Glycopyrrol-Formoterol (BREZTRI AEROSPHERE) 160-9-4.8 MCG/ACT AERO, Inhale 1 Pump into the lungs in the morning and at bedtime. (Patient not taking: Reported on 01/02/2022), Disp: 10.7 g, Rfl: 1   busPIRone (BUSPAR) 10 MG tablet, Take  1 tablet  3 x /day for Anxiety   /  TAKE 1 TABLET BY MOUTH THREE TIMES A DAY **INS 30 DAY**, Disp: 270 tablet, Rfl: 3   cefdinir (OMNICEF) 300 MG  capsule, Take 1 capsule (300 mg total) by mouth 2 (two) times daily., Disp: 20 capsule, Rfl: 0   cyanocobalamin (,VITAMIN B-12,) 1000 MCG/ML injection, Inject 1068mg once weekly for 6 weeks, then once a month (Patient not taking: Reported on 01/02/2022), Disp: 30 mL, Rfl: 0   cyclobenzaprine (FLEXERIL) 10 MG tablet, Take 1 tablet (10 mg total) by mouth 3 (three) times daily as needed for muscle spasms., Disp: 30 tablet, Rfl: 1   Dextromethorphan Polistirex (ROBITUSSIN 12 HOUR COUGH PO), Take by mouth., Disp: , Rfl:    escitalopram (LEXAPRO) 20 MG tablet, TAKE 1 TABLET BY MOUTH EVERY DAY, Disp: 90 tablet, Rfl: 3   folic acid (FOLVITE) 1 MG tablet, Take 2 mg by mouth at bedtime., Disp: , Rfl:    levocetirizine (XYZAL) 5 MG tablet, Take 5 mg by mouth every evening., Disp: , Rfl:    levothyroxine (SYNTHROID) 137 MCG tablet, Take  1 tablet  Daily  on an empty stomach with only water for 30 minutes & no Antacid meds, Calcium or Magnesium for 4 hours & avoid Biotin, Disp: 90 tablet, Rfl: 3   meclizine (ANTIVERT) 25 MG tablet, Take 1 tablet (25 mg total) by mouth 3 (three) times daily as needed for dizziness., Disp: 30 tablet, Rfl: 0   Menthol, Topical Analgesic, (BIOFREEZE ROLL-ON EX), Apply 1 application topically daily as needed (pain)., Disp: , Rfl:    methotrexate 50 MG/2ML injection, Inject 150 mg into the muscle once a week., Disp: , Rfl:    naproxen (NAPROSYN) 500 MG tablet, Take 1 tablet (500 mg total) by mouth 2 (two) times daily with a meal., Disp: 60 tablet, Rfl: 1   olmesartan (BENICAR) 40 MG tablet, Take  1 tablet  Daily  for BP   /  TAKE 1 TABLET DAILY FOR BLOOD PRESSURE, Disp: 90 tablet, Rfl: 3   ondansetron (ZOFRAN) 4 MG tablet, Take 1 tablet (4 mg total) by mouth every 8 (eight) hours as needed for nausea or vomiting., Disp: 10 tablet, Rfl: 0   OVER THE COUNTER MEDICATION, Apply 1 application topically 5 (five) times daily as needed (pain). CBD Cream, Disp: , Rfl:    oxyCODONE-acetaminophen  (PERCOCET) 5-325 MG tablet, Take 1 tablet by mouth every 4 (four) hours as needed (max 6 q). (Patient not taking: Reported on 01/02/2022), Disp: 20 tablet, Rfl: 0   pantoprazole (PROTONIX) 20 MG tablet, TAKE 1 TABLET DAILY FOR INDIGESTION & HEARTBURN, Disp: 30 tablet, Rfl: 11   phentermine (ADIPEX-P) 37.5 MG tablet, TAKE 1/2 TO 1 TABLET EVERY MORNING FOR DIETING & WEIGHT LOSS, Disp: 90 tablet, Rfl: 0   topiramate (TOPAMAX) 50 MG tablet, TAKE 1/2 TO 1 TABLET 2 TIMES A DAY AT SUPPERTIME &  BEDTIME FOR DIETING & WEIGHT LOSS (Patient not taking: Reported on 01/02/2022), Disp: 180 tablet, Rfl: 3   triamcinolone cream (KENALOG) 0.5 %, Apply 1 application topically 2 (two) times daily. (Patient taking differently: Apply 1 application. topically 2 (two) times daily as needed (irritation).), Disp: 80 g, Rfl: 2   Vitamin D, Ergocalciferol, (DRISDOL) 1.25 MG (50000 UNIT) CAPS capsule, TAKE 1 CAPSULE BY MOUTH 3 DAYS A WEEK FOR VITAMIN DEFICIENCY, Disp: 12 capsule, Rfl: 5  Allergies  Allergen Reactions   Darvon [Propoxyphene Hcl] Anaphylaxis and Other (See Comments)    Airway swelling    Propoxyphene Anaphylaxis    Throat closes up   Doxycycline Nausea Only   Other     Any narcotic med but morphine makes pt nauseated    Tamiflu [Oseltamivir Phosphate] Hives and Other (See Comments)    Increased blood pressure           Objective:  Physical Exam  General: AAO x3, NAD  Dermatological: Skin is warm, dry and supple bilateral. There are no open sores, no preulcerative lesions, no rash or signs of infection present.  Vascular: Dorsalis Pedis artery and Posterior Tibial artery pedal pulses are 2/4 bilateral with immedate capillary fill time. There is no pain with calf compression, swelling, warmth, erythema.   Neruologic: Sensation intact with Thornell Mule monofilament.  Negative Tinel sign.  Musculoskeletal: On the right side there is tenderness palpation along the lateral aspect of the course of  peroneal tendons as well as along the medial aspect bilaterally along the posterior tibial tendon.  Right side seems worse than the left.  Mild discomfort anterior ankle joint.  There is no pain or crepitation with ankle joint range of motion.  No pain with subtalar joint range of motion.  There is chronic appearing edema to the ankle there is no erythema or warmth.  MMT 5/5.  Equinus present  Gait: Unassisted, Nonantalgic.       Assessment:   Tendinitis bilateral ankles     Plan:  -Treatment options discussed including all alternatives, risks, and complications -Etiology of symptoms were discussed -X-rays were obtained and reviewed with the patient.  3 views of bilateral ankles were obtained.  No evidence of acute fracture noted.  Ankle joint is maintained. -Most of her discomfort seems to be along more of the tendons as opposed to the actual ankle joint itself.  We discussed traction, icing on a regular basis as well as wearing shoes and good arch support.  I did dispense a night splint to help stretch the tendons.  Trula Slade DPM

## 2022-03-09 ENCOUNTER — Ambulatory Visit (INDEPENDENT_AMBULATORY_CARE_PROVIDER_SITE_OTHER): Payer: 59 | Admitting: Nurse Practitioner

## 2022-03-09 VITALS — BP 122/80 | HR 56 | Temp 95.7°F | Ht 65.0 in | Wt 242.0 lb

## 2022-03-09 DIAGNOSIS — R300 Dysuria: Secondary | ICD-10-CM

## 2022-03-09 DIAGNOSIS — R39198 Other difficulties with micturition: Secondary | ICD-10-CM

## 2022-03-09 DIAGNOSIS — R102 Pelvic and perineal pain: Secondary | ICD-10-CM

## 2022-03-09 MED ORDER — FOLIC ACID 1 MG PO TABS: 2.0000 mg | ORAL_TABLET | Freq: Every day | ORAL | 2 refills | Status: DC

## 2022-03-09 MED ORDER — CYANOCOBALAMIN 1000 MCG/ML IJ SOLN: INTRAMUSCULAR | 0 refills | Status: DC

## 2022-03-09 MED ORDER — NITROFURANTOIN MONOHYD MACRO 100 MG PO CAPS: 100.0000 mg | ORAL_CAPSULE | Freq: Two times a day (BID) | ORAL | 0 refills | Status: AC

## 2022-03-09 NOTE — Progress Notes (Signed)
Assessment and Plan:  JAALIYAH LUCATERO was seen today for an episodic visit.  Diagnoses and all order for this visit:  1. Difficulty voiding/Dysuria Monitor intake and output. Urine hat provided and education discussed on use. Possible referral back to [place] for further review and evaluation. Imperical treatment provided for UTI. Continue to monitor Report to ER if unable to void.  - Urinalysis w microscopic + reflex cultur - nitrofurantoin, macrocrystal-monohydrate, (MACROBID) 100 MG capsule; Take 1 capsule (100 mg total) by mouth 2 (two) times daily for 5 days.  Dispense: 10 capsule; Refill: 0  2. Suprapubic pain  - Urinalysis w microscopic + reflex cultur - nitrofurantoin, macrocrystal-monohydrate, (MACROBID) 100 MG capsule; Take 1 capsule (100 mg total) by mouth 2 (two) times daily for 5 days.  Dispense: 10 capsule; Refill: 0   Continue to monitor for any increase in fever, chills, N/V, blood in urine, unable to void. Increase in pain.  Notify office for further evaluation and treatment, questions or concerns if s/s fail to improve. The risks and benefits of my recommendations, as well as other treatment options were discussed with the patient today. Questions were answered.  Further disposition pending results of labs. Discussed med's effects and SE's.    Over 20 minutes of exam, counseling, chart review, and critical decision making was performed.   Future Appointments  Date Time Provider [place]  01/03/2023  3:00 PM Chistine Dematteo, Kenney Houseman, NP [name] None    ------------------------------------------------------------------------------------------------------------------   HPI BP 122/80   Pulse (!) 56   Temp (!) 95.7 F (35.4 C)   Ht '5\' 5"'$  (1.651 m)   Wt 242 lb (109.8 kg)   SpO2 97%   BMI 40.27 kg/m   61 y.o.female presents for evaluation of bladder fullness, associated suprapubic pain, dysuria.  States that she will drink approximately x3 32 oz water with  occassional 1/2 glass tea.   For the last three weeks she has noticed only voiding once a day. She is also feeling as though she has to squeeze out the urine.  She does not currently follow with Uro/Gyn. She had a bladder sling put in 8-10 years ago, Dr. [facility].  Denies fever, chills, N/V.    Past Medical History:  Diagnosis Date   Allergy    Anemia    Anxiety    Arthritis    "knees, hands, ankles, ~ every joint" (04/08/2015)   Basal cell carcinoma    "face, hands, chest" (04/08/2015)   Chronic bronchitis ([place])    "get it pretty much q yr" (04/08/2015)   COPD (chronic obstructive pulmonary disease) (HCC)    Dysrhythmia    palpitations occ; SVT 09/2013 s/p adenosine   GERD (gastroesophageal reflux disease)    H/O hiatal hernia    Hx of cardiovascular stress test    Lexiscan Myoview 6/16: Ejection fraction is 60% and wall motion is normal. The study is normal. There is no scar or ischemia. This is a low risk scan.   Hyperlipidemia    Hypertension    Hypothyroidism    Palpitations    PONV (postoperative nausea and vomiting)    S/P total knee arthroplasty 02/15/2014   Sleep apnea    Thyroid disease    Vitamin D deficiency      Allergies  Allergen Reactions   Darvon [Propoxyphene Hcl] Anaphylaxis and Other (See Comments)    Airway swelling    Propoxyphene Anaphylaxis    Throat closes up   Doxycycline Nausea Only   Other  Any narcotic med but morphine makes pt nauseated    Tamiflu [Oseltamivir Phosphate] Hives and Other (See Comments)    Increased blood pressure    Current Outpatient Medications on File Prior to Visit  Medication Sig   acetaminophen (TYLENOL) 650 MG CR tablet Take 650 mg by mouth 3 (three) times daily as needed for pain.   bisoprolol-hydrochlorothiazide (ZIAC) 10-6.25 MG tablet Take 1 tablet every Morning for BP   Budeson-Glycopyrrol-Formoterol (BREZTRI AEROSPHERE) 160-9-4.8 MCG/ACT AERO Inhale 1 Pump into the lungs in the morning and at bedtime.    busPIRone (BUSPAR) 10 MG tablet Take  1 tablet  3 x /day for Anxiety   /  TAKE 1 TABLET BY MOUTH THREE TIMES A DAY **INS 30 DAY**   cyanocobalamin (,VITAMIN B-12,) 1000 MCG/ML injection Inject 1066mg once weekly for 6 weeks, then once a month   cyclobenzaprine (FLEXERIL) 10 MG tablet Take 1 tablet (10 mg total) by mouth 3 (three) times daily as needed for muscle spasms.   escitalopram (LEXAPRO) 20 MG tablet TAKE 1 TABLET BY MOUTH EVERY DAY   folic acid (FOLVITE) 1 MG tablet Take 2 mg by mouth at bedtime.   levocetirizine (XYZAL) 5 MG tablet Take 5 mg by mouth every evening.   levothyroxine (SYNTHROID) 137 MCG tablet Take  1 tablet  Daily  on an empty stomach with only water for 30 minutes & no Antacid meds, Calcium or Magnesium for 4 hours & avoid Biotin   meclizine (ANTIVERT) 25 MG tablet Take 1 tablet (25 mg total) by mouth 3 (three) times daily as needed for dizziness.   Menthol, Topical Analgesic, (BIOFREEZE ROLL-ON EX) Apply 1 application topically daily as needed (pain).   methotrexate 50 MG/2ML injection Inject 150 mg into the muscle once a week.   naproxen (NAPROSYN) 500 MG tablet Take 1 tablet (500 mg total) by mouth 2 (two) times daily with a meal.   olmesartan (BENICAR) 40 MG tablet Take  1 tablet  Daily  for BP   /  TAKE 1 TABLET DAILY FOR BLOOD PRESSURE   ondansetron (ZOFRAN) 4 MG tablet Take 1 tablet (4 mg total) by mouth every 8 (eight) hours as needed for nausea or vomiting.   OVER THE COUNTER MEDICATION Apply 1 application topically 5 (five) times daily as needed (pain). CBD Cream   pantoprazole (PROTONIX) 20 MG tablet TAKE 1 TABLET DAILY FOR INDIGESTION & HEARTBURN   topiramate (TOPAMAX) 50 MG tablet TAKE 1/2 TO 1 TABLET 2 TIMES A DAY AT SUPPERTIME & BEDTIME FOR DIETING & WEIGHT LOSS   triamcinolone cream (KENALOG) 0.5 % Apply 1 application topically 2 (two) times daily. (Patient taking differently: Apply 1 application  topically 2 (two) times daily as needed (irritation).)    Vitamin D, Ergocalciferol, (DRISDOL) 1.25 MG (50000 UNIT) CAPS capsule TAKE 1 CAPSULE BY MOUTH 3 DAYS A WEEK FOR VITAMIN DEFICIENCY   albuterol (VENTOLIN HFA) 108 (90 Base) MCG/ACT inhaler Inhale 2 puffs into the lungs every 4 (four) hours as needed for wheezing or shortness of breath.   cefdinir (OMNICEF) 300 MG capsule Take 1 capsule (300 mg total) by mouth 2 (two) times daily.   Dextromethorphan Polistirex (ROBITUSSIN 12 HOUR COUGH PO) Take by mouth.   oxyCODONE-acetaminophen (PERCOCET) 5-325 MG tablet Take 1 tablet by mouth every 4 (four) hours as needed (max 6 q). (Patient not taking: Reported on 01/02/2022)   phentermine (ADIPEX-P) 37.5 MG tablet TAKE 1/2 TO 1 TABLET EVERY MORNING FOR DIETING & WEIGHT LOSS (Patient not  taking: Reported on 03/09/2022)   No current facility-administered medications on file prior to visit.    ROS: all negative except what is noted in the HPI.    Physical Exam:  BP 122/80   Pulse (!) 56   Temp (!) 95.7 F (35.4 C)   Ht '5\' 5"'$  (1.651 m)   Wt 242 lb (109.8 kg)   SpO2 97%   BMI 40.27 kg/m   General Appearance: NAD.  Awake, conversant and cooperative. Eyes: PERRLA, EOMs intact.  Sclera white.  Conjunctiva without erythema. Sinuses: No frontal/maxillary tenderness.  No nasal discharge. Nares patent.  ENT/Mouth: Ext aud canals clear.  Bilateral TMs w/DOL and without erythema or bulging. Hearing intact.  Posterior pharynx without swelling or exudate.  Tonsils without swelling or erythema.  Neck: Supple.  No masses, nodules or thyromegaly. Respiratory: Effort is regular with non-labored breathing. Breath sounds are equal bilaterally without rales, rhonchi, wheezing or stridor.  Cardio: RRR with no MRGs. Brisk peripheral pulses without edema.  Abdomen: Tender to palpation in suprapubic area. Active BS in all four quadrants.  Soft and non-tender without guarding, rebound tenderness, hernias or masses. Lymphatics: Non tender without lymphadenopathy.   Musculoskeletal: Full ROM, 5/5 strength, normal ambulation.  No clubbing or cyanosis. Skin: Appropriate color for ethnicity. Warm without rashes, lesions, ecchymosis, ulcers.  Neuro: CN II-XII grossly normal. Normal muscle tone without cerebellar symptoms and intact sensation.   Psych: AO X 3,  appropriate mood and affect, insight and judgment.     Darrol Jump, NP 9:49 AM [facility]

## 2022-03-09 NOTE — Patient Instructions (Signed)
Pyelonephritis, Adult  Pyelonephritis is an infection that occurs in the kidney. The kidneys are organs that help clean the blood by moving waste out of the blood and into the pee (urine). This infection can happen quickly, or it can last for a long time. In most cases, it clears up with treatment and does not cause other problems. What are the causes? This condition may be caused by: Germs (bacteria) going from the bladder up to the kidney. This may happen after having a bladder infection. Germs going from the blood to the kidney. What increases the risk? This condition is more likely to develop in: Pregnant women. Older people. People who have any of these conditions: Diabetes. Inflammation of the prostate gland (prostatitis), in males. Kidney stones or bladder stones. Other problems with the kidney or the parts of your body that carry pee from the kidneys to the bladder (ureters). Cancer. People who have a small, thin tube (catheter) placed in the bladder. People who are sexually active. Women who use a medicine that kills sperm (spermicide) to prevent pregnancy. People who have had a prior urinary tract infection (UTI). What are the signs or symptoms? Symptoms of this condition include: Peeing often. A strong urge to pee right away. Burning or stinging when peeing. Belly pain. Back pain. Pain in the side (flank area). Fever or chills. Blood in the pee, or dark pee. Feeling sick to your stomach (nauseous) or throwing up (vomiting). How is this treated? This condition may be treated by: Taking antibiotic medicines by mouth (orally). Drinking enough fluids. If the infection is bad, you may need to stay in the hospital. You may be given antibiotics and fluids that are put directly into a vein through an IV tube. In some cases, other treatments may be needed. Follow these instructions at home: Medicines Take your antibiotic medicine as told by your doctor. Do not stop taking  the antibiotic even if you start to feel better. Take over-the-counter and prescription medicines only as told by your doctor. General instructions  Drink enough fluid to keep your pee pale yellow. Avoid caffeine, tea, and carbonated drinks. Pee (urinate) often. Avoid holding in pee for long periods of time. Pee before and after sex. After pooping (having a bowel movement), women should wipe from front to back. Use each tissue only once. Keep all follow-up visits as told by your doctor. This is important. Contact a doctor if: You do not feel better after 2 days. Your symptoms get worse. You have a fever. Get help right away if: You cannot take your medicine or drink fluids as told. You have chills and shaking. You throw up. You have very bad pain in your side or back. You feel very weak or you pass out (faint). Summary Pyelonephritis is an infection that occurs in the kidney. In most cases, this infection clears up with treatment and does not cause other problems. Take your antibiotic medicine as told by your doctor. Do not stop taking the antibiotic even if you start to feel better. Drink enough fluid to keep your pee pale yellow. This information is not intended to replace advice given to you by your health care provider. Make sure you discuss any questions you have with your health care provider. Document Revised: 03/09/2021 Document Reviewed: 03/09/2021 Elsevier Patient Education  [place].

## 2022-03-11 LAB — URINALYSIS W MICROSCOPIC + REFLEX CULTURE
Bacteria, UA: NONE SEEN /HPF
Bilirubin Urine: NEGATIVE
Glucose, UA: NEGATIVE
Hgb urine dipstick: NEGATIVE
Hyaline Cast: NONE SEEN /LPF
Ketones, ur: NEGATIVE
Nitrites, Initial: NEGATIVE
Protein, ur: NEGATIVE
RBC / HPF: NONE SEEN /HPF (ref 0–2)
Specific Gravity, Urine: 1.014 (ref 1.001–1.035)
WBC, UA: NONE SEEN /HPF (ref 0–5)
pH: 6.5 (ref 5.0–8.0)

## 2022-03-11 LAB — URINE CULTURE
MICRO NUMBER:: 13711225
SPECIMEN QUALITY:: ADEQUATE

## 2022-03-11 LAB — CULTURE INDICATED

## 2022-03-13 ENCOUNTER — Telehealth: Payer: Self-pay | Admitting: Nurse Practitioner

## 2022-03-13 NOTE — Telephone Encounter (Signed)
Pt had a surgery to aid in weight loss and has followed dr's recommendations and still struggling to lose weight. Wanting to know if she might be insulin resistant and is wanting a test done to see of there is an issue or not

## 2022-03-15 ENCOUNTER — Encounter: Payer: Self-pay | Admitting: Nurse Practitioner

## 2022-03-15 ENCOUNTER — Other Ambulatory Visit: Payer: Self-pay | Admitting: Nurse Practitioner

## 2022-03-15 DIAGNOSIS — N3000 Acute cystitis without hematuria: Secondary | ICD-10-CM

## 2022-03-15 MED ORDER — CEFDINIR 300 MG PO CAPS
300.0000 mg | ORAL_CAPSULE | Freq: Two times a day (BID) | ORAL | 0 refills | Status: DC
Start: 2022-03-15 — End: 2023-01-14

## 2022-03-16 NOTE — Telephone Encounter (Signed)
-----   Message from Darrol Jump, NP sent at 03/15/2022  8:42 PM EDT ----- Regarding: UTI I informed pt via mychart that I was changing her abx to [place] from Nitrofurantoin based on what her urine cx resulted.  Because it has taken a few days to result, I woud like to make sure she knows this, as sometimes they do not get the [employer].   I will keep a lookout for her response, nonetheless.

## 2022-03-16 NOTE — Telephone Encounter (Signed)
Left message on voicemail to take a look at [place] and to call back with any questions.

## 2022-03-19 ENCOUNTER — Ambulatory Visit: Payer: 59 | Admitting: Nurse Practitioner

## 2022-03-19 ENCOUNTER — Encounter: Payer: Self-pay | Admitting: Nurse Practitioner

## 2022-03-19 VITALS — BP 138/88 | HR 55 | Temp 97.2°F | Ht 65.0 in | Wt 241.0 lb

## 2022-03-19 DIAGNOSIS — E782 Mixed hyperlipidemia: Secondary | ICD-10-CM | POA: Diagnosis not present

## 2022-03-19 DIAGNOSIS — E079 Disorder of thyroid, unspecified: Secondary | ICD-10-CM

## 2022-03-19 DIAGNOSIS — M069 Rheumatoid arthritis, unspecified: Secondary | ICD-10-CM

## 2022-03-19 DIAGNOSIS — I10 Essential (primary) hypertension: Secondary | ICD-10-CM

## 2022-03-19 DIAGNOSIS — Z9884 Bariatric surgery status: Secondary | ICD-10-CM

## 2022-03-19 DIAGNOSIS — R7303 Prediabetes: Secondary | ICD-10-CM

## 2022-03-19 DIAGNOSIS — Z79899 Other long term (current) drug therapy: Secondary | ICD-10-CM

## 2022-03-19 NOTE — Progress Notes (Signed)
Assessment and Plan:  Sherry Dennis was seen today for a follow up.  Diagnoses and all order for this visit:  1. Primary hypertension Controlled  Continue medications;  Discussed DASH (Dietary Approaches to Stop Hypertension) DASH diet is lower in sodium than a typical American diet. Cut back on foods that are high in saturated fat, cholesterol, and trans fats. Eat more whole-grain foods, fish, poultry, and nuts Remain active and exercise as tolerated daily.  Monitor BP at home-Call if greater than 130/80.   - CBC with Differential/Platelet - COMPLETE METABOLIC PANEL WITH GFR  2. Thyroid disease  - TSH  3. Rheumatoid arthritis, involving unspecified site, unspecified whether rheumatoid factor present ([place]) Continue injections.  - CBC with Differential/Platelet  4. Mixed hyperlipidemia Controlled with elevated Triglycerides. Discussed lifestyle modifications. Recommended diet heavy in fruits and veggies, omega 3's. Decrease consumption of animal meats, cheeses, and dairy products. Remain active and exercise as tolerated. Continue to monitor.  - Lipid panel  5. Prediabetes Education: Reviewed 'ABCs' of diabetes management  Discussed goals to achieve/maintain include: A1C (<7) Blood pressure (<130/80) Cholesterol (LDL <70) Continue Eye Exam yearly  Continue Dental Exam Q6 mo Discussed dietary recommendations Discussed Physical Activity recommendations Foot exam UTD  - Hemoglobin A1c  6. S/P laparoscopic sleeve gastrectomy Instructed patient to reach out to weight loss center, Dr. Redmond Pulling with [facility] for further information regarding continued weight gain.    7. Medication management All medications discussed and reviewed in full. All questions and concerns regarding medications addressed.     - CBC with Differential/Platelet - COMPLETE METABOLIC PANEL WITH GFR - TSH - Hemoglobin A1c - Lipid panel  8. Morbid obesity ([place]) Discussed appropriate  BMI Goal of losing 1 lb per month. Diet modification. Physical activity. Encouraged/praised to build confidence.  Further disposition pending results of labs. Discussed med's effects and SE's.    Over 20 minutes of exam, counseling, chart review, and critical decision making was performed.   Future Appointments  Date Time Provider [place]  01/03/2023  3:00 PM Judia Arnott, Kenney Houseman, NP [name] None    ------------------------------------------------------------------------------------------------------------------   HPI BP 138/88   Pulse (!) 55   Temp (!) 97.2 F (36.2 C)   Ht '5\' 5"'$  (1.651 m)   Wt 241 lb (109.3 kg)   SpO2 94%   BMI 40.10 kg/m   61 y.o.female presents for follow up on weight loss management along with other healthcare maintenance.    She is concerned for continued weight gain after having gastric sleeve performed by Dr. Sabra Heck in 2021.  She is interested in weight loss medication  moving forward.  She has not recently followed up with Dr. Sabra Heck.  She does have a hx of thyroid disorder along with autoimmune disease.  She feels as though these are chronic contributors to her weight gain.    Past Medical History:  Diagnosis Date   Allergy    Anemia    Anxiety    Arthritis    "knees, hands, ankles, ~ every joint" (04/08/2015)   Basal cell carcinoma    "face, hands, chest" (04/08/2015)   Chronic bronchitis ([place])    "get it pretty much q yr" (04/08/2015)   COPD (chronic obstructive pulmonary disease) (HCC)    Dysrhythmia    palpitations occ; SVT 09/2013 s/p adenosine   GERD (gastroesophageal reflux disease)    H/O hiatal hernia    Hx of cardiovascular stress test    Lexiscan Myoview 6/16: Ejection fraction is 60% and wall  motion is normal. The study is normal. There is no scar or ischemia. This is a low risk scan.   Hyperlipidemia    Hypertension    Hypothyroidism    Palpitations    PONV (postoperative nausea and vomiting)    S/P total knee  arthroplasty 02/15/2014   Sleep apnea    Thyroid disease    Vitamin D deficiency      Allergies  Allergen Reactions   Darvon [Propoxyphene Hcl] Anaphylaxis and Other (See Comments)    Airway swelling    Propoxyphene Anaphylaxis    Throat closes up   Doxycycline Nausea Only   Other     Any narcotic med but morphine makes pt nauseated    Tamiflu [Oseltamivir Phosphate] Hives and Other (See Comments)    Increased blood pressure    Current Outpatient Medications on File Prior to Visit  Medication Sig   acetaminophen (TYLENOL) 650 MG CR tablet Take 650 mg by mouth 3 (three) times daily as needed for pain.   bisoprolol-hydrochlorothiazide (ZIAC) 10-6.25 MG tablet Take 1 tablet every Morning for BP   Budeson-Glycopyrrol-Formoterol (BREZTRI AEROSPHERE) 160-9-4.8 MCG/ACT AERO Inhale 1 Pump into the lungs in the morning and at bedtime.   busPIRone (BUSPAR) 10 MG tablet Take  1 tablet  3 x /day for Anxiety   /  TAKE 1 TABLET BY MOUTH THREE TIMES A DAY **INS 30 DAY**   cefdinir (OMNICEF) 300 MG capsule Take 1 capsule (300 mg total) by mouth 2 (two) times daily.   cyanocobalamin (VITAMIN B12) 1000 MCG/ML injection Inject 107mg once weekly for 6 weeks, then once a month   cyclobenzaprine (FLEXERIL) 10 MG tablet Take 1 tablet (10 mg total) by mouth 3 (three) times daily as needed for muscle spasms.   escitalopram (LEXAPRO) 20 MG tablet TAKE 1 TABLET BY MOUTH EVERY DAY   folic acid (FOLVITE) 1 MG tablet Take 2 tablets (2 mg total) by mouth at bedtime.   levocetirizine (XYZAL) 5 MG tablet Take 5 mg by mouth every evening.   levothyroxine (SYNTHROID) 137 MCG tablet Take  1 tablet  Daily  on an empty stomach with only water for 30 minutes & no Antacid meds, Calcium or Magnesium for 4 hours & avoid Biotin   meclizine (ANTIVERT) 25 MG tablet Take 1 tablet (25 mg total) by mouth 3 (three) times daily as needed for dizziness.   Menthol, Topical Analgesic, (BIOFREEZE ROLL-ON EX) Apply 1 application  topically daily as needed (pain).   methotrexate 50 MG/2ML injection Inject 150 mg into the muscle once a week.   naproxen (NAPROSYN) 500 MG tablet Take 1 tablet (500 mg total) by mouth 2 (two) times daily with a meal.   olmesartan (BENICAR) 40 MG tablet Take  1 tablet  Daily  for BP   /  TAKE 1 TABLET DAILY FOR BLOOD PRESSURE   ondansetron (ZOFRAN) 4 MG tablet Take 1 tablet (4 mg total) by mouth every 8 (eight) hours as needed for nausea or vomiting.   OVER THE COUNTER MEDICATION Apply 1 application topically 5 (five) times daily as needed (pain). CBD Cream   pantoprazole (PROTONIX) 20 MG tablet TAKE 1 TABLET DAILY FOR INDIGESTION & HEARTBURN   phentermine (ADIPEX-P) 37.5 MG tablet TAKE 1/2 TO 1 TABLET EVERY MORNING FOR DIETING & WEIGHT LOSS   topiramate (TOPAMAX) 50 MG tablet TAKE 1/2 TO 1 TABLET 2 TIMES A DAY AT SUPPERTIME & BEDTIME FOR DIETING & WEIGHT LOSS   triamcinolone cream (KENALOG) 0.5 % Apply  1 application topically 2 (two) times daily. (Patient taking differently: Apply 1 application  topically 2 (two) times daily as needed (irritation).)   Vitamin D, Ergocalciferol, (DRISDOL) 1.25 MG (50000 UNIT) CAPS capsule TAKE 1 CAPSULE BY MOUTH 3 DAYS A WEEK FOR VITAMIN DEFICIENCY   albuterol (VENTOLIN HFA) 108 (90 Base) MCG/ACT inhaler Inhale 2 puffs into the lungs every 4 (four) hours as needed for wheezing or shortness of breath.   Dextromethorphan Polistirex (ROBITUSSIN 12 HOUR COUGH PO) Take by mouth.   oxyCODONE-acetaminophen (PERCOCET) 5-325 MG tablet Take 1 tablet by mouth every 4 (four) hours as needed (max 6 q). (Patient not taking: Reported on 01/02/2022)   No current facility-administered medications on file prior to visit.    ROS: all negative except what is noted in the HPI.    Physical Exam:  BP 138/88   Pulse (!) 55   Temp (!) 97.2 F (36.2 C)   Ht '5\' 5"'$  (1.651 m)   Wt 241 lb (109.3 kg)   SpO2 94%   BMI 40.10 kg/m   General Appearance: NAD.  Awake, conversant and  cooperative. Eyes: PERRLA, EOMs intact.  Sclera white.  Conjunctiva without erythema. Sinuses: No frontal/maxillary tenderness.  No nasal discharge. Nares patent.  ENT/Mouth: Ext aud canals clear.  Bilateral TMs w/DOL and without erythema or bulging. Hearing intact.  Posterior pharynx without swelling or exudate.  Tonsils without swelling or erythema.  Neck: Supple.  No masses, nodules or thyromegaly. Respiratory: Effort is regular with non-labored breathing. Breath sounds are equal bilaterally without rales, rhonchi, wheezing or stridor.  Cardio: RRR with no MRGs. Brisk peripheral pulses without edema.  Abdomen: Active BS in all four quadrants.  Soft and non-tender without guarding, rebound tenderness, hernias or masses. Lymphatics: Non tender without lymphadenopathy.  Musculoskeletal: Full ROM, 5/5 strength, normal ambulation.  No clubbing or cyanosis. Skin: Appropriate color for ethnicity. Warm without rashes, lesions, ecchymosis, ulcers.  Neuro: CN II-XII grossly normal. Normal muscle tone without cerebellar symptoms and intact sensation.   Psych: AO X 3,  appropriate mood and affect, insight and judgment.     Darrol Jump, NP 11:56 AM [facility]

## 2022-03-20 ENCOUNTER — Telehealth: Payer: Self-pay | Admitting: Nurse Practitioner

## 2022-03-20 LAB — COMPLETE METABOLIC PANEL WITH GFR
AG Ratio: 1.5 (calc) (ref 1.0–2.5)
ALT: 12 U/L (ref 6–29)
AST: 13 U/L (ref 10–35)
Albumin: 3.9 g/dL (ref 3.6–5.1)
Alkaline phosphatase (APISO): 112 U/L (ref 37–153)
BUN: 18 mg/dL (ref 7–25)
CO2: 28 mmol/L (ref 20–32)
Calcium: 9.4 mg/dL (ref 8.6–10.4)
Chloride: 104 mmol/L (ref 98–110)
Creat: 0.87 mg/dL (ref 0.50–1.03)
Globulin: 2.6 g/dL (calc) (ref 1.9–3.7)
Glucose, Bld: 91 mg/dL (ref 65–99)
Potassium: 4.4 mmol/L (ref 3.5–5.3)
Sodium: 140 mmol/L (ref 135–146)
Total Bilirubin: 0.2 mg/dL (ref 0.2–1.2)
Total Protein: 6.5 g/dL (ref 6.1–8.1)
eGFR: 77 mL/min/{1.73_m2} (ref >=60)

## 2022-03-20 LAB — CBC WITH DIFFERENTIAL/PLATELET
Absolute Monocytes: 823 cells/uL (ref 200–950)
Basophils Absolute: 50 cells/uL (ref 0–200)
Basophils Relative: 0.6 %
Eosinophils Absolute: 202 cells/uL (ref 15–500)
Eosinophils Relative: 2.4 %
HCT: 35.3 % (ref 35.0–45.0)
Hemoglobin: 11.9 g/dL (ref 11.7–15.5)
Lymphs Abs: 2772 cells/uL (ref 850–3900)
MCH: 31.2 pg (ref 27.0–33.0)
MCHC: 33.7 g/dL (ref 32.0–36.0)
MCV: 92.4 fL (ref 80.0–100.0)
MPV: 11.3 fL (ref 7.5–12.5)
Monocytes Relative: 9.8 %
Neutro Abs: 4553 cells/uL (ref 1500–7800)
Neutrophils Relative %: 54.2 %
Platelets: 315 10*3/uL (ref 140–400)
RBC: 3.82 10*6/uL (ref 3.80–5.10)
RDW: 14.3 % (ref 11.0–15.0)
Total Lymphocyte: 33 %
WBC: 8.4 10*3/uL (ref 3.8–10.8)

## 2022-03-20 LAB — HEMOGLOBIN A1C
Hgb A1c MFr Bld: 6 % of total Hgb — ABNORMAL HIGH (ref <5.7)
Mean Plasma Glucose: 126 mg/dL
eAG (mmol/L): 7 mmol/L

## 2022-03-20 LAB — LIPID PANEL
Cholesterol: 169 mg/dL (ref <200)
HDL: 43 mg/dL — ABNORMAL LOW (ref >=50)
LDL Cholesterol (Calc): 99 mg/dL (calc)
Non-HDL Cholesterol (Calc): 126 mg/dL (calc) (ref <130)
Total CHOL/HDL Ratio: 3.9 (calc) (ref <5.0)
Triglycerides: 167 mg/dL — ABNORMAL HIGH (ref <150)

## 2022-03-20 LAB — TSH: TSH: 0.1 mIU/L — ABNORMAL LOW (ref 0.40–4.50)

## 2022-03-20 NOTE — Telephone Encounter (Signed)
Pt called that to confirm that she is taking Levothyroxine 137 mcg

## 2022-03-21 ENCOUNTER — Encounter: Payer: Self-pay | Admitting: Nurse Practitioner

## 2022-03-21 ENCOUNTER — Other Ambulatory Visit: Payer: Self-pay | Admitting: Nurse Practitioner

## 2022-03-21 DIAGNOSIS — R7989 Other specified abnormal findings of blood chemistry: Secondary | ICD-10-CM

## 2022-03-21 NOTE — Progress Notes (Unsigned)
Lab order placed for updated TSH levels

## 2022-04-30 ENCOUNTER — Other Ambulatory Visit: Payer: Self-pay | Admitting: Internal Medicine

## 2022-04-30 DIAGNOSIS — F419 Anxiety disorder, unspecified: Secondary | ICD-10-CM

## 2022-05-18 ENCOUNTER — Ambulatory Visit (INDEPENDENT_AMBULATORY_CARE_PROVIDER_SITE_OTHER): Payer: 59 | Admitting: Nurse Practitioner

## 2022-05-18 ENCOUNTER — Other Ambulatory Visit: Payer: Self-pay

## 2022-05-18 ENCOUNTER — Encounter: Payer: Self-pay | Admitting: Nurse Practitioner

## 2022-05-18 VITALS — BP 138/90 | HR 70 | Temp 97.5°F | Ht 65.0 in | Wt 243.8 lb

## 2022-05-18 DIAGNOSIS — K068 Other specified disorders of gingiva and edentulous alveolar ridge: Secondary | ICD-10-CM | POA: Diagnosis not present

## 2022-05-18 DIAGNOSIS — J0110 Acute frontal sinusitis, unspecified: Secondary | ICD-10-CM

## 2022-05-18 DIAGNOSIS — G4483 Primary cough headache: Secondary | ICD-10-CM | POA: Diagnosis not present

## 2022-05-18 DIAGNOSIS — R051 Acute cough: Secondary | ICD-10-CM

## 2022-05-18 DIAGNOSIS — Z1152 Encounter for screening for COVID-19: Secondary | ICD-10-CM | POA: Diagnosis not present

## 2022-05-18 LAB — POC COVID19 BINAXNOW: SARS Coronavirus 2 Ag: NEGATIVE

## 2022-05-18 MED ORDER — AZITHROMYCIN 500 MG PO TABS: 500.0000 mg | ORAL_TABLET | Freq: Every day | ORAL | 0 refills | Status: AC

## 2022-05-18 NOTE — Patient Instructions (Signed)

## 2022-05-18 NOTE — Progress Notes (Signed)
Assessment and Plan:  Sherry Dennis was seen today for an episodic visit.  Diagnoses and all order for this visit:  1. Encounter for screening for COVID-19 Negative  - POC COVID-19  2. Acute non-recurrent frontal sinusitis Stay well hydrated to keep mucus thin and productive. Mucinex or antihistamine as needed.  - azithromycin (ZITHROMAX) 500 MG tablet; Take 1 tablet (500 mg total) by mouth daily for 10 days. Take 1 tablet daily for 3 days.  Dispense: 10 tablet; Refill: 0  3. Pain in gums/Cough/HA Tylenol for pain Tylenol for pain. Rest Push fluids  Report to ER for any difficulty breathing.   Notify office for further evaluation and treatment, questions or concerns if s/s fail to improve. The risks and benefits of my recommendations, as well as other treatment options were discussed with the patient today. Questions were answered.  Further disposition pending results of labs. Discussed med's effects and SE's.    Over 15 minutes of exam, counseling, chart review, and critical decision making was performed.   Future Appointments  Date Time Provider [place]  01/03/2023  3:00 PM Kylia Grajales, Kenney Houseman, NP [name] None    ------------------------------------------------------------------------------------------------------------------   HPI BP (!) 138/90   Pulse 70   Temp (!) 97.5 F (36.4 C)   Ht '5\' 5"'$  (1.651 m)   Wt 243 lb 12.8 oz (110.6 kg)   SpO2 96%   BMI 40.57 kg/m    Patient complains of symptoms of a URI, possible sinusitis. Symptoms include achiness, congestion, facial pain, headache described as throbbing, nasal congestion, sinus pressure, and body aches . Onset of symptoms was 3 days ago, and has been unchanged since that time. Treatment to date: cough suppressants and decongestants.  She has been taking OTC Tylenol cold and flu. She reports getting the flu shot 2 weeks ago.  Denies fever, chills, N/V, SOB.     Past Medical History:  Diagnosis Date    Allergy    Anemia    Anxiety    Arthritis    "knees, hands, ankles, ~ every joint" (04/08/2015)   Basal cell carcinoma    "face, hands, chest" (04/08/2015)   Chronic bronchitis ([place])    "get it pretty much q yr" (04/08/2015)   COPD (chronic obstructive pulmonary disease) ([facility])    Dysrhythmia    palpitations occ; SVT 09/2013 s/p adenosine   GERD (gastroesophageal reflux disease)    H/O hiatal hernia    Hx of cardiovascular stress test    Lexiscan Myoview 6/16: Ejection fraction is 60% and wall motion is normal. The study is normal. There is no scar or ischemia. This is a low risk scan.   Hyperlipidemia    Hypertension    Hypothyroidism    Palpitations    PONV (postoperative nausea and vomiting)    S/P total knee arthroplasty 02/15/2014   Sleep apnea    Thyroid disease    Vitamin D deficiency      Allergies  Allergen Reactions   Darvon [Propoxyphene Hcl] Anaphylaxis and Other (See Comments)    Airway swelling    Propoxyphene Anaphylaxis    Throat closes up   Doxycycline Nausea Only   Other     Any narcotic med but morphine makes pt nauseated    Tamiflu [Oseltamivir Phosphate] Hives and Other (See Comments)    Increased blood pressure    Current Outpatient Medications on File Prior to Visit  Medication Sig   acetaminophen (TYLENOL) 650 MG CR tablet Take 650 mg by mouth 3 (  three) times daily as needed for pain.   albuterol (VENTOLIN HFA) 108 (90 Base) MCG/ACT inhaler Inhale 2 puffs into the lungs every 4 (four) hours as needed for wheezing or shortness of breath.   bisoprolol-hydrochlorothiazide (ZIAC) 10-6.25 MG tablet Take 1 tablet every Morning for BP   Budeson-Glycopyrrol-Formoterol (BREZTRI AEROSPHERE) 160-9-4.8 MCG/ACT AERO Inhale 1 Pump into the lungs in the morning and at bedtime.   busPIRone (BUSPAR) 10 MG tablet TAKE 1 TABLET 3 TIMES A DAY FOR ANXIETY **INS 30 DAY**   cefdinir (OMNICEF) 300 MG capsule Take 1 capsule (300 mg total) by mouth 2 (two) times daily.    cyanocobalamin (VITAMIN B12) 1000 MCG/ML injection Inject 1025mg once weekly for 6 weeks, then once a month   cyclobenzaprine (FLEXERIL) 10 MG tablet Take 1 tablet (10 mg total) by mouth 3 (three) times daily as needed for muscle spasms.   escitalopram (LEXAPRO) 20 MG tablet TAKE 1 TABLET BY MOUTH EVERY DAY   folic acid (FOLVITE) 1 MG tablet Take 2 tablets (2 mg total) by mouth at bedtime.   levocetirizine (XYZAL) 5 MG tablet Take 5 mg by mouth every evening.   levothyroxine (SYNTHROID) 137 MCG tablet Take  1 tablet  Daily  on an empty stomach with only water for 30 minutes & no Antacid meds, Calcium or Magnesium for 4 hours & avoid Biotin   meclizine (ANTIVERT) 25 MG tablet Take 1 tablet (25 mg total) by mouth 3 (three) times daily as needed for dizziness.   Menthol, Topical Analgesic, (BIOFREEZE ROLL-ON EX) Apply 1 application topically daily as needed (pain).   methotrexate 50 MG/2ML injection Inject 150 mg into the muscle once a week.   naproxen (NAPROSYN) 500 MG tablet Take 1 tablet (500 mg total) by mouth 2 (two) times daily with a meal.   olmesartan (BENICAR) 40 MG tablet TAKE 1 TABLET DAILY FOR BLOOD PRESSURE   ondansetron (ZOFRAN) 4 MG tablet Take 1 tablet (4 mg total) by mouth every 8 (eight) hours as needed for nausea or vomiting.   OVER THE COUNTER MEDICATION Apply 1 application topically 5 (five) times daily as needed (pain). CBD Cream   pantoprazole (PROTONIX) 20 MG tablet TAKE 1 TABLET DAILY FOR INDIGESTION & HEARTBURN   phentermine (ADIPEX-P) 37.5 MG tablet TAKE 1/2 TO 1 TABLET EVERY MORNING FOR DIETING & WEIGHT LOSS   topiramate (TOPAMAX) 50 MG tablet TAKE 1/2 TO 1 TABLET 2 TIMES A DAY AT SUPPERTIME & BEDTIME FOR DIETING & WEIGHT LOSS   triamcinolone cream (KENALOG) 0.5 % Apply 1 application topically 2 (two) times daily. (Patient taking differently: Apply 1 application  topically 2 (two) times daily as needed (irritation).)   Vitamin D, Ergocalciferol, (DRISDOL) 1.25 MG (50000  UNIT) CAPS capsule TAKE 1 CAPSULE BY MOUTH 3 DAYS A WEEK FOR VITAMIN DEFICIENCY   Dextromethorphan Polistirex (ROBITUSSIN 12 HOUR COUGH PO) Take by mouth.   oxyCODONE-acetaminophen (PERCOCET) 5-325 MG tablet Take 1 tablet by mouth every 4 (four) hours as needed (max 6 q). (Patient not taking: Reported on 01/02/2022)   No current facility-administered medications on file prior to visit.    ROS: all negative except what is noted in the HPI.   Physical Exam:  BP (!) 138/90   Pulse 70   Temp (!) 97.5 F (36.4 C)   Ht '5\' 5"'$  (1.651 m)   Wt 243 lb 12.8 oz (110.6 kg)   SpO2 96%   BMI 40.57 kg/m   General Appearance: NAD.  Awake, conversant and cooperative.  Eyes: PERRLA, EOMs intact.  Sclera white.  Conjunctiva without erythema. Sinuses: Frontal/maxillary tenderness.  No nasal discharge. Nares not patent ENT/Mouth: Ext aud canals clear.  Bilateral TMs w/DOL and without erythema or bulging. Hearing intact.  Posterior pharynx without swelling or exudate.  Tonsils without swelling or erythema.  Neck: Supple.  No masses, nodules or thyromegaly. Respiratory: Effort is regular with non-labored breathing. Breath sounds are equal bilaterally without rales, rhonchi, wheezing or stridor.  Cardio: RRR with no MRGs. Brisk peripheral pulses without edema.  Abdomen: Active BS in all four quadrants.  Soft and non-tender without guarding, rebound tenderness, hernias or masses. Lymphatics: Non tender without lymphadenopathy.  Musculoskeletal: Full ROM, 5/5 strength, normal ambulation.  No clubbing or cyanosis. Skin: Appropriate color for ethnicity. Warm without rashes, lesions, ecchymosis, ulcers.  Neuro: CN II-XII grossly normal. Normal muscle tone without cerebellar symptoms and intact sensation.   Psych: AO X 3,  appropriate mood and affect, insight and judgment.     Darrol Jump, NP 11:34 AM [facility]

## 2022-05-25 ENCOUNTER — Encounter: Payer: Self-pay | Admitting: Internal Medicine

## 2022-05-25 ENCOUNTER — Other Ambulatory Visit: Payer: Self-pay | Admitting: Nurse Practitioner

## 2022-05-25 DIAGNOSIS — F419 Anxiety disorder, unspecified: Secondary | ICD-10-CM

## 2022-05-25 LAB — HM MAMMOGRAPHY

## 2022-05-28 ENCOUNTER — Encounter: Payer: Self-pay | Admitting: Nurse Practitioner

## 2022-05-29 ENCOUNTER — Other Ambulatory Visit: Payer: Self-pay | Admitting: Internal Medicine

## 2022-05-29 ENCOUNTER — Other Ambulatory Visit: Payer: Self-pay | Admitting: Nurse Practitioner

## 2022-05-29 DIAGNOSIS — E039 Hypothyroidism, unspecified: Secondary | ICD-10-CM

## 2022-06-26 ENCOUNTER — Other Ambulatory Visit: Payer: Self-pay | Admitting: Nurse Practitioner

## 2022-06-30 ENCOUNTER — Other Ambulatory Visit: Payer: Self-pay | Admitting: Nurse Practitioner

## 2022-07-16 ENCOUNTER — Other Ambulatory Visit: Payer: Self-pay | Admitting: Nurse Practitioner

## 2022-10-10 ENCOUNTER — Other Ambulatory Visit: Payer: Self-pay | Admitting: Orthopedic Surgery

## 2022-10-10 DIAGNOSIS — M542 Cervicalgia: Secondary | ICD-10-CM

## 2022-10-10 DIAGNOSIS — M545 Low back pain, unspecified: Secondary | ICD-10-CM

## 2022-10-16 ENCOUNTER — Inpatient Hospital Stay: Payer: 59

## 2022-10-16 ENCOUNTER — Inpatient Hospital Stay: Payer: 59 | Attending: Genetic Counselor | Admitting: Genetic Counselor

## 2022-10-17 ENCOUNTER — Other Ambulatory Visit: Payer: Self-pay | Admitting: Nurse Practitioner

## 2022-10-17 ENCOUNTER — Encounter: Payer: 59 | Admitting: Adult Health

## 2022-10-28 ENCOUNTER — Ambulatory Visit
Admission: RE | Admit: 2022-10-28 | Discharge: 2022-10-28 | Disposition: A | Discharge status: Home / Self Care | Payer: 59 | Source: Ambulatory Visit | Attending: Orthopedic Surgery | Admitting: Orthopedic Surgery

## 2022-10-28 DIAGNOSIS — M542 Cervicalgia: Secondary | ICD-10-CM

## 2022-10-28 DIAGNOSIS — M545 Low back pain, unspecified: Secondary | ICD-10-CM

## 2022-12-20 ENCOUNTER — Encounter (HOSPITAL_COMMUNITY): Payer: Self-pay | Admitting: *Deleted

## 2022-12-29 ENCOUNTER — Other Ambulatory Visit: Payer: Self-pay | Admitting: Nurse Practitioner

## 2023-01-03 ENCOUNTER — Encounter: Payer: 59 | Admitting: Nurse Practitioner

## 2023-01-14 ENCOUNTER — Encounter: Payer: Self-pay | Admitting: Nurse Practitioner

## 2023-01-14 ENCOUNTER — Ambulatory Visit (INDEPENDENT_AMBULATORY_CARE_PROVIDER_SITE_OTHER): Payer: 59 | Admitting: Nurse Practitioner

## 2023-01-14 VITALS — BP 118/70 | HR 61 | Temp 97.5°F | Ht 64.5 in | Wt 241.4 lb

## 2023-01-14 DIAGNOSIS — Z136 Encounter for screening for cardiovascular disorders: Secondary | ICD-10-CM

## 2023-01-14 DIAGNOSIS — M069 Rheumatoid arthritis, unspecified: Secondary | ICD-10-CM

## 2023-01-14 DIAGNOSIS — K635 Polyp of colon: Secondary | ICD-10-CM

## 2023-01-14 DIAGNOSIS — J439 Emphysema, unspecified: Secondary | ICD-10-CM

## 2023-01-14 DIAGNOSIS — E538 Deficiency of other specified B group vitamins: Secondary | ICD-10-CM

## 2023-01-14 DIAGNOSIS — J302 Other seasonal allergic rhinitis: Secondary | ICD-10-CM

## 2023-01-14 DIAGNOSIS — F419 Anxiety disorder, unspecified: Secondary | ICD-10-CM

## 2023-01-14 DIAGNOSIS — I700 Atherosclerosis of aorta: Secondary | ICD-10-CM

## 2023-01-14 DIAGNOSIS — J449 Chronic obstructive pulmonary disease, unspecified: Secondary | ICD-10-CM

## 2023-01-14 DIAGNOSIS — Z720 Tobacco use: Secondary | ICD-10-CM

## 2023-01-14 DIAGNOSIS — Z0000 Encounter for general adult medical examination without abnormal findings: Secondary | ICD-10-CM

## 2023-01-14 DIAGNOSIS — E079 Disorder of thyroid, unspecified: Secondary | ICD-10-CM

## 2023-01-14 DIAGNOSIS — I10 Essential (primary) hypertension: Secondary | ICD-10-CM

## 2023-01-14 DIAGNOSIS — E559 Vitamin D deficiency, unspecified: Secondary | ICD-10-CM

## 2023-01-14 DIAGNOSIS — G4733 Obstructive sleep apnea (adult) (pediatric): Secondary | ICD-10-CM

## 2023-01-14 DIAGNOSIS — E782 Mixed hyperlipidemia: Secondary | ICD-10-CM

## 2023-01-14 DIAGNOSIS — Z79899 Other long term (current) drug therapy: Secondary | ICD-10-CM

## 2023-01-14 DIAGNOSIS — R0981 Nasal congestion: Secondary | ICD-10-CM

## 2023-01-14 DIAGNOSIS — Z9081 Acquired absence of spleen: Secondary | ICD-10-CM

## 2023-01-14 DIAGNOSIS — Z1389 Encounter for screening for other disorder: Secondary | ICD-10-CM

## 2023-01-14 DIAGNOSIS — Z9884 Bariatric surgery status: Secondary | ICD-10-CM

## 2023-01-14 DIAGNOSIS — Z0001 Encounter for general adult medical examination with abnormal findings: Secondary | ICD-10-CM

## 2023-01-14 DIAGNOSIS — R7303 Prediabetes: Secondary | ICD-10-CM

## 2023-01-14 DIAGNOSIS — I471 Supraventricular tachycardia, unspecified: Secondary | ICD-10-CM

## 2023-01-14 MED ORDER — TRIAMCINOLONE ACETONIDE 0.5 % EX CREA: 1.0000 | TOPICAL_CREAM | Freq: Two times a day (BID) | CUTANEOUS | 2 refills | Status: DC

## 2023-01-14 NOTE — Progress Notes (Signed)
COMPLETE PHYSICAL  Assessment and Plan:   Encounter for general adult medical examination with abnormal findings Due Annually Health maintenance reviewed   Aortic atherosclerosis (HCC) - per CT 08/31/2017 Control blood pressure, cholesterol, glucose, increase exercise.  Per CT 08/30/2017  PSVT (paroxysmal supraventricular tachycardia) (HCC) Controlled. No longer needs to follow with Cardiology. Will continue to monitor. EKG  COPD/Smoker Lung Cancer Screening with LDCT - ordered. Continue Albuterol. Smoking cessation instruction/counseling given:  counseled patient on the dangers of tobacco use, advised patient to stop smoking, and reviewed strategies to maximize success Not ready to quit. Will continue to monitor.  Nocturnal hypoxemia due to emphysema (HCC) No recent events. Smoking cessation and counseling provided. Continue to monitor.  Rheumatoid arthritis, involving unspecified site, unspecified whether rheumatoid factor present (HCC) Stable. Continue medications; Methotrexate Continue to follow with Rheumatology. Continue to monitor.  Morbid obesity (HCC) Discussed appropriate BMI Diet modification. Physical activity. Encouraged/praised to build confidence.  Mixed hyperlipidemia Discussed lifestyle modifications. Recommended diet heavy in fruits and veggies, omega 3's. Decrease consumption of animal meats, cheeses, and dairy products. Remain active and exercise as tolerated. Continue to monitor. Check lipids/TSH  Thyroid disease Controlled. Continue Levothyroxine. Reminded to take on an empty stomach 30-100mins before food.  Stop any Biotin Supplement 48-72 hours before next TSH level to reduce the risk of falsely low TSH levels. Continue to monitor.     Vitamin D deficiency Continue supplement for goal of 60-100 Monitor Vitamin D levels  OSA on CPAP Controlled. Continue CPAP nightly.  Reminder to have tubes cleaned, exchanged.  Primary  hypertension Discussed DASH (Dietary Approaches to Stop Hypertension) DASH diet is lower in sodium than a typical American diet. Cut back on foods that are high in saturated fat, cholesterol, and trans fats. Eat more whole-grain foods, fish, poultry, and nuts Remain active and exercise as tolerated daily.  Monitor BP at home-Call if greater than 130/80.  Check CMP/CBC  Polyp of cecum/Hx of colon polyps Continue Colonoscopy UTD as of 2019 with 10 year recall - Due 2029 Continue to monitor.  Anxiety Continue medications; Buspar, Lexapro Reviewed relaxation techniques.  Sleep hygiene. Recommended Cognitive Behavioral Therapy (CBT). Recommended mindfulness meditation and exercise.   Insight-oriented psychotherapy given for 16 minutes exclusively. Psychoeducation:  encouraged personality growth wand development through coping techniques and problem-solving skills. Limit/Decrease/Monitor drug/alcohol intake.    H/O splenectomy Continue to monitor.  S/P laparoscopic sleeve gastrectomy Continue weight loss. Lifestyle modifications, diet, exercise.  Continue to monitor  B12 deficiency Monitor levels  Medication management All medications reviewed and discussed in detail. All questions and concerns addressed  Screening, ischemic heart disease  - EKG 12-Lead  Screening for blood or protein in urine  - Urinalysis, Routine w reflex microscopic  Seasonal allergies/sinus congestion Norel AD sample provided Stay well hydrated to keep mucus thin and productive Avoid triggers  Orders Placed This Encounter  Procedures   CT CHEST LUNG CA SCREEN LOW DOSE W/O CM    Standing Status:   Future    Standing Expiration Date:   01/14/2024    Order Specific Question:   Preferred Imaging Location?    Answer:   [id number]    Order Specific Question:   Is patient pregnant?    Answer:   No   CBC with Differential/Platelet   COMPLETE METABOLIC PANEL WITH GFR   Magnesium   Lipid  panel   TSH   Hemoglobin A1c   Insulin, random   VITAMIN D 25 Hydroxy (Vit-D Deficiency, Fractures)  Urinalysis, Routine w reflex microscopic   Microalbumin / creatinine urine ratio   Vitamin B12   EKG 12-Lead    Notify office for further evaluation and treatment, questions or concerns if any reported s/s fail to improve.   The patient was advised to call back or seek an in-person evaluation if any symptoms worsen or if the condition fails to improve as anticipated.   Further disposition pending results of labs. Discussed med's effects and SE's.    I discussed the assessment and treatment plan with the patient. The patient was provided an opportunity to ask questions and all were answered. The patient agreed with the plan and demonstrated an understanding of the instructions.  Discussed med's effects and SE's. Screening labs and tests as requested with regular follow-up as recommended.  I provided 35 minutes of face-to-face time during this encounter including counseling, chart review, and critical decision making was preformed.  Today's Plan of Care is based on a patient-centered health care approach known as shared decision making - the decisions, tests and treatments allow for patient preferences and values to be balanced with clinical evidence.     Future Appointments  Date Time Provider Department Center  01/14/2024 10:00 AM Adela Glimpse, NP [name] None    HPI 62 y.o. female  presents for an annual CPE.  She has Hypertension; Hyperlipidemia; Thyroid disease; Vitamin D deficiency; PSVT (paroxysmal supraventricular tachycardia); Rheumatoid arthritis (HCC); COPD GOLD 0/ former smoker at risk; External hemorrhoid, bleeding; Rectal pain; Aortic atherosclerosis (HCC) - per CT 08/31/2017; Adrenal adenoma; History of colonic polyps; Polyp of cecum; OSA on CPAP; Nocturnal hypoxemia due to emphysema (HCC); Former smoker; Morbid obesity (HCC); S/P laparoscopic sleeve gastrectomy; H/O  splenectomy; and Anxiety on their problem list.  Overall she reports feeling well today.  She has had an increase in nasal congestions secondary to allergies.  She uses Ventolin and Breztri to help control.  At times takes Xyzal, this has not helped recently.    She is managing well with stress and anxiety. Continues Buspar and Lexapo.  She is looking forward to retirement this year and spending time with her 7 grandchildren.   She has pain on the right side of her neck and follows with the pain clinic.  Recently being treated by [employer].  She has received steroid injections in her lower back and epidural in her neck.  She is scheduled to received another injection 01/2023.  If failed, she is considering ablation.  She is taking routine tylenol, using a heating pad and also Voltaren gel for this.  Has Oxycodone as well.  Of note, She followed with [facility] 08/16/22 for PT evaluation by Dr. Linna Caprice for bilateral knee pain.  She had right partial knee replacement 2013.  She continues to have intermittent abdominal tenderness s/p gastric sleeve.  RLQ RUQ tenderness and reports it feels heavy.  LBM was today, takes miralax daily with breakfast. Denies any cramping, bloating, nausea or vomiting.  She had the gastric sleeve Oct 13th, highest weight was 273.   Her blood pressure has been controlled at home, her BP is doing better with ziac 5mg , Benicar 20mg  and lasix pill BUT she forgot her medications today, today their BP is BP: 118/70   Completed 3D mammogram on October 11th of 2023. No significant abnormality or change. recommendation. Screening mammogram in one year.  Patient has history of ablation for SVT with Dr. Dalbert Mayotte in 2016.  She has a history of RA.  Following with Dr. Dierdre Forth  and last OV was 05/18/20  Due to complications of gastric sleeve she had a splenectomy BMI is Body mass index is 40.8 kg/m., she is working on diet and exercise.  Wt Readings from Last 3  Encounters:  01/14/23 241 lb 6.4 oz (109.5 kg)  05/18/22 243 lb 12.8 oz (110.6 kg)  03/19/22 241 lb (109.3 kg)   She does not workout.  She denies chest pain, shortness of breath, dizziness.  She is not on cholesterol medication, family history of MI, GM at 41, mom at 12 and denies myalgias. Had recent Stress test 03/2019, normal Echo, did show LVH.  Her cholesterol is at goal. The cholesterol last visit was:   Lab Results  Component Value Date   CHOL 169 03/19/2022   HDL 43 (L) 03/19/2022   LDLCALC 99 03/19/2022   TRIG 167 (H) 03/19/2022   CHOLHDL 3.9 03/19/2022   She has been working on diet and exercise for prediabetes, and denies paresthesia of the feet, polydipsia and polyuria. Last A1C in the office was:  Lab Results  Component Value Date   HGBA1C 6.0 (H) 03/19/2022   Patient is on Vitamin D supplement, she is on 50,000, 3 days a week- not taking well.    Lab Results  Component Value Date   VD25OH 97 01/02/2022     She is on thyroid medication. Her levels were low last blood work, she was asked to RTC for updated levels - this was not completed.  Plan to obtain updated levels today. Lab Results  Component Value Date   TSH 0.10 (L) 03/19/2022  .  She has a B12 def, on B12 injections, hx of gastric surgery - she has taken in Dec and Jan.   Lab Results  Component Value Date   VITAMINB12 336 01/02/2022    Current Medications:   Current Outpatient Medications (Endocrine & Metabolic):    levothyroxine (SYNTHROID) 137 MCG tablet, TAKE 1 TABLET DAILY ON AN EMPTY STOMACH WITH ONLY WATER FOR 30 MINUTES & NO ANTACID MEDS, CALCIUM OR MAGNESIUM FOR 4 HOURS & AVOID BIOTIN  Current Outpatient Medications (Cardiovascular):    bisoprolol-hydrochlorothiazide (ZIAC) 10-6.25 MG tablet, TAKE 1 TABLET BY MOUTH EVERY MORNING FOR BLOOD PRESSURE   olmesartan (BENICAR) 40 MG tablet, TAKE 1 TABLET DAILY FOR BLOOD PRESSURE  Current Outpatient Medications (Respiratory):     Budeson-Glycopyrrol-Formoterol (BREZTRI AEROSPHERE) 160-9-4.8 MCG/ACT AERO, Inhale 1 Pump into the lungs in the morning and at bedtime.   levocetirizine (XYZAL) 5 MG tablet, Take 5 mg by mouth every evening.   albuterol (VENTOLIN HFA) 108 (90 Base) MCG/ACT inhaler, Inhale 2 puffs into the lungs every 4 (four) hours as needed for wheezing or shortness of breath.   Dextromethorphan Polistirex (ROBITUSSIN 12 HOUR COUGH PO), Take by mouth.  Current Outpatient Medications (Analgesics):    abatacept (ORENCIA) 250 MG injection, as directed Intravenous   acetaminophen (TYLENOL) 650 MG CR tablet, Take 650 mg by mouth 3 (three) times daily as needed for pain.   naproxen (NAPROSYN) 500 MG tablet, Take 1 tablet (500 mg total) by mouth 2 (two) times daily with a meal.   oxyCODONE-acetaminophen (PERCOCET) 5-325 MG tablet, Take 1 tablet by mouth every 4 (four) hours as needed (max 6 q). (Patient not taking: Reported on 01/02/2022)  Current Outpatient Medications (Hematological):    cyanocobalamin (VITAMIN B12) 1000 MCG/ML injection, INJECT ONCE WEEKLY FOR 6 WEEKS, THEN ONCE A MONTH   folic acid (FOLVITE) 1 MG tablet, TAKE 2 TABLETS (2 MG TOTAL)  BY MOUTH AT BEDTIME.  Current Outpatient Medications (Other):    busPIRone (BUSPAR) 10 MG tablet, TAKE 1 TABLET 3 TIMES A DAY FOR ANXIETY **INS 30 DAY**   cyclobenzaprine (FLEXERIL) 10 MG tablet, Take 1 tablet (10 mg total) by mouth 3 (three) times daily as needed for muscle spasms.   escitalopram (LEXAPRO) 20 MG tablet, TAKE 1 TABLET BY MOUTH EVERY DAY   meclizine (ANTIVERT) 25 MG tablet, Take 1 tablet (25 mg total) by mouth 3 (three) times daily as needed for dizziness.   Menthol, Topical Analgesic, (BIOFREEZE ROLL-ON EX), Apply 1 application topically daily as needed (pain).   methocarbamol (ROBAXIN) 500 MG tablet, Take 500-1,000 mg by mouth every 6 (six) hours as needed.   methotrexate 50 MG/2ML injection, Inject 150 mg into the muscle once a week.    ondansetron (ZOFRAN) 4 MG tablet, Take 1 tablet (4 mg total) by mouth every 8 (eight) hours as needed for nausea or vomiting.   OVER THE COUNTER MEDICATION, Apply 1 application topically 5 (five) times daily as needed (pain). CBD Cream   pantoprazole (PROTONIX) 20 MG tablet, TAKE 1 TABLET DAILY FOR INDIGESTION & HEARTBURN   Vitamin D, Ergocalciferol, (DRISDOL) 1.25 MG (50000 UNIT) CAPS capsule, TAKE 1 CAPSULE BY MOUTH 3 DAYS A WEEK FOR VITAMIN DEFICIENCY   cefdinir (OMNICEF) 300 MG capsule, Take 1 capsule (300 mg total) by mouth 2 (two) times daily.   phentermine (ADIPEX-P) 37.5 MG tablet, TAKE 1/2 TO 1 TABLET EVERY MORNING FOR DIETING & WEIGHT LOSS   topiramate (TOPAMAX) 50 MG tablet, TAKE 1/2 TO 1 TABLET 2 TIMES A DAY AT SUPPERTIME & BEDTIME FOR DIETING & WEIGHT LOSS   triamcinolone cream (KENALOG) 0.5 %, Apply 1 Application topically 2 (two) times daily.  Medical History:  Past Medical History:  Diagnosis Date   Allergy    Anemia    Anxiety    Arthritis    "knees, hands, ankles, ~ every joint" (04/08/2015)   Basal cell carcinoma    "face, hands, chest" (04/08/2015)   Chronic bronchitis (HCC)    "get it pretty much q yr" (04/08/2015)   COPD (chronic obstructive pulmonary disease) (HCC)    Dysrhythmia    palpitations occ; SVT 09/2013 s/p adenosine   GERD (gastroesophageal reflux disease)    H/O hiatal hernia    Hx of cardiovascular stress test    Lexiscan Myoview 6/16: Ejection fraction is 60% and wall motion is normal. The study is normal. There is no scar or ischemia. This is a low risk scan.   Hyperlipidemia    Hypertension    Hypothyroidism    Palpitations    PONV (postoperative nausea and vomiting)    S/P total knee arthroplasty 02/15/2014   Sleep apnea    Thyroid disease    Vitamin D deficiency     Allergies Allergies  Allergen Reactions   Darvon [Propoxyphene Hcl] Anaphylaxis and Other (See Comments)    Airway swelling    Propoxyphene Anaphylaxis    Throat closes up    Doxycycline Nausea Only   Other     Any narcotic med but morphine makes pt nauseated    Tamiflu [Oseltamivir Phosphate] Hives and Other (See Comments)    Increased blood pressure    SURGICAL HISTORY She  has a past surgical history that includes Carpal tunnel release (Right, 1990); Tubal ligation (1987); Knee arthroscopy (Right, 2013); Bladder repair (1993); Endometrial ablation (2009); Excision basal cell carcinoma (2014); Partial knee arthroplasty (Right, 02/15/2014); Cardiac catheterization (N/A, 04/08/2015);  Joint replacement; Incontinence surgery (2010); Knee arthroscopy; Cardiac surgery; Colonoscopy; Polypectomy; Colonoscopy with propofol (N/A, 11/25/2017); Cardiac catheterization (2017); Laparoscopic gastric sleeve resection (N/A, 05/26/2019); Splenectomy, total (05/26/2019); and Reverse shoulder arthroplasty (Right, 04/27/2021). FAMILY HISTORY Her family history includes Atrial fibrillation in her mother; Breast cancer in her paternal aunt; COPD in her father; Diabetes in her father; Emphysema in her father; Heart attack in her maternal grandmother; Heart disease in her maternal grandfather, paternal grandfather, and paternal grandmother; Hyperlipidemia in her maternal grandfather, paternal grandfather, and paternal grandmother; Hypertension in her mother; Stroke in her maternal grandmother. SOCIAL HISTORY She  reports that she has been smoking cigarettes. She has a 45.00 pack-year smoking history. She has never used smokeless tobacco. She reports that she does not drink alcohol and does not use drugs.   Immunization History  Administered Date(s) Administered   Influenza,inj,quad, With Preservative 05/14/2019   Influenza-Unspecified 05/14/2015, 05/27/2017, 05/13/2018   Meningococcal B, OMV 06/19/2019   Meningococcal Mcv4o 06/19/2019   PFIZER(Purple Top)SARS-COV-2 Vaccination 10/23/2019, 11/18/2019, 06/02/2020   PPD Test 12/02/2013   Pneumococcal Conjugate-13 09/01/2019   Pneumococcal  Polysaccharide-23 04/08/2012, 04/06/2020   Td 10/16/2004   Tdap 07/24/2017   MAINTENANCE: Colonoscopy: 11/2017  EGD: 2011 Mammogram: Due 05/2022 BMD: N/A Pap/ Pelvic: GYN 2010, OVERDUE - plans to reach out. IEP:[employer] Dentist:Q 6-12 month, DUE - plans to schedule  Derm: Last OV 2020, DUE - plans to schedule - no concerns today Dermatology for skin check:  Due. Had two skin cancers removed.  Has had BCC since 62 yo  CXR:09/25/13   IMMUNIZATIONS: TD/Tdap: 2018 Pneumovax:04/08/12 Zostavax:N/A Influenza: 2023  Review of Systems  Constitutional: Negative for chills, diaphoresis, fever, malaise/fatigue and weight loss.  HENT: Negative.   Eyes: Negative.   Respiratory: Negative for cough, shortness of breath and wheezing.   Cardiovascular: Negative for chest pain, palpitations, orthopnea, claudication, leg swelling and PND.  Gastrointestinal: Negative.   Genitourinary: Negative.   Musculoskeletal: Positive for back pain. Negative for falls, joint pain, myalgias and neck pain.  Skin: Negative.   Neurological: Negative for dizziness, tingling, tremors, sensory change, speech change, focal weakness, seizures, loss of consciousness, weakness and headaches.  Psychiatric/Behavioral: Negative for depression, hallucinations, memory loss, substance abuse and suicidal ideas. The patient is not nervous/anxious and does not have insomnia.     Physical Exam: BP 118/70   Pulse 61   Temp (!) 97.5 F (36.4 C)   Ht 5' 4.5" (1.638 m)   Wt 241 lb 6.4 oz (109.5 kg)   SpO2 94%   BMI 40.80 kg/m  Wt Readings from Last 3 Encounters:  01/14/23 241 lb 6.4 oz (109.5 kg)  05/18/22 243 lb 12.8 oz (110.6 kg)  03/19/22 241 lb (109.3 kg)   General Appearance: Well nourished, in no apparent distress. Eyes: PERRLA, EOMs, conjunctiva no swelling or erythema Sinuses: No Frontal/maxillary tenderness ENT/Mouth: Ext aud canals clear, TMs without erythema, bulging. No erythema, swelling, or exudate on post  pharynx.  Tonsils not swollen or erythematous. Hearing normal.  Neck: Supple, thyroid normal.  Respiratory: Respiratory effort normal, BS equal bilaterally with wheezing left lower lung rhonchi, or stridor.  Cardio: RRR with no MRGs. Brisk peripheral pulses with 2+ edema.  Abdomen: Soft, + BS, obese Non tender, no guarding, rebound, hernias, masses. Lymphatics: Non tender without lymphadenopathy.  Musculoskeletal: Full ROM, 5/5 strength, antalgic gait Skin: Warm, dry without rashes, lesions, ecchymosis.  Neuro: Cranial nerves intact. Normal muscle tone, no cerebellar symptoms. Sensation intact.  Psych: Awake and oriented X 3,  normal affect, Insight and Judgment appropriate.   EKG: SB No ST changes  Adela Glimpse, NP [facility] 01/14/2023  11:33 AM

## 2023-01-14 NOTE — Patient Instructions (Signed)

## 2023-01-15 LAB — COMPLETE METABOLIC PANEL WITH GFR
AG Ratio: 1.6 (calc) (ref 1.0–2.5)
ALT: 10 U/L (ref 6–29)
AST: 12 U/L (ref 10–35)
Albumin: 4 g/dL (ref 3.6–5.1)
Alkaline phosphatase (APISO): 104 U/L (ref 37–153)
BUN: 13 mg/dL (ref 7–25)
CO2: 27 mmol/L (ref 20–32)
Calcium: 9 mg/dL (ref 8.6–10.4)
Chloride: 103 mmol/L (ref 98–110)
Creat: 0.85 mg/dL (ref 0.50–1.05)
Globulin: 2.5 g/dL (calc) (ref 1.9–3.7)
Glucose, Bld: 104 mg/dL — ABNORMAL HIGH (ref 65–99)
Potassium: 4.2 mmol/L (ref 3.5–5.3)
Sodium: 139 mmol/L (ref 135–146)
Total Bilirubin: 0.3 mg/dL (ref 0.2–1.2)
Total Protein: 6.5 g/dL (ref 6.1–8.1)
eGFR: 78 mL/min/{1.73_m2} (ref >=60)

## 2023-01-15 LAB — CBC WITH DIFFERENTIAL/PLATELET
Absolute Monocytes: 689 cells/uL (ref 200–950)
Basophils Absolute: 33 cells/uL (ref 0–200)
Basophils Relative: 0.4 %
Eosinophils Absolute: 74 cells/uL (ref 15–500)
Eosinophils Relative: 0.9 %
HCT: 34.1 % — ABNORMAL LOW (ref 35.0–45.0)
Hemoglobin: 11.1 g/dL — ABNORMAL LOW (ref 11.7–15.5)
Lymphs Abs: 2140 cells/uL (ref 850–3900)
MCH: 31.4 pg (ref 27.0–33.0)
MCHC: 32.6 g/dL (ref 32.0–36.0)
MCV: 96.3 fL (ref 80.0–100.0)
MPV: 11.2 fL (ref 7.5–12.5)
Monocytes Relative: 8.4 %
Neutro Abs: 5264 cells/uL (ref 1500–7800)
Neutrophils Relative %: 64.2 %
Platelets: 292 10*3/uL (ref 140–400)
RBC: 3.54 10*6/uL — ABNORMAL LOW (ref 3.80–5.10)
RDW: 13.1 % (ref 11.0–15.0)
Total Lymphocyte: 26.1 %
WBC: 8.2 10*3/uL (ref 3.8–10.8)

## 2023-01-15 LAB — URINALYSIS, ROUTINE W REFLEX MICROSCOPIC
Bacteria, UA: NONE SEEN /HPF
Bilirubin Urine: NEGATIVE
Glucose, UA: NEGATIVE
Hgb urine dipstick: NEGATIVE
Ketones, ur: NEGATIVE
Nitrite: NEGATIVE
Protein, ur: NEGATIVE
RBC / HPF: NONE SEEN /HPF (ref 0–2)
Specific Gravity, Urine: 1.007 (ref 1.001–1.035)
pH: 6.5 (ref 5.0–8.0)

## 2023-01-15 LAB — LIPID PANEL
Cholesterol: 180 mg/dL (ref <200)
HDL: 52 mg/dL (ref >=50)
LDL Cholesterol (Calc): 100 mg/dL (calc) — ABNORMAL HIGH
Non-HDL Cholesterol (Calc): 128 mg/dL (calc) (ref <130)
Total CHOL/HDL Ratio: 3.5 (calc) (ref <5.0)
Triglycerides: 184 mg/dL — ABNORMAL HIGH (ref <150)

## 2023-01-15 LAB — TSH: TSH: 0.67 mIU/L (ref 0.40–4.50)

## 2023-01-15 LAB — INSULIN, RANDOM: Insulin: 18.9 u[IU]/mL — ABNORMAL HIGH

## 2023-01-15 LAB — MAGNESIUM: Magnesium: 2.1 mg/dL (ref 1.5–2.5)

## 2023-01-15 LAB — VITAMIN D 25 HYDROXY (VIT D DEFICIENCY, FRACTURES): Vit D, 25-Hydroxy: 72 ng/mL (ref 30–100)

## 2023-01-15 LAB — HEMOGLOBIN A1C
Hgb A1c MFr Bld: 6.5 % of total Hgb — ABNORMAL HIGH (ref <5.7)
Mean Plasma Glucose: 140 mg/dL
eAG (mmol/L): 7.7 mmol/L

## 2023-01-15 LAB — VITAMIN B12: Vitamin B-12: 243 pg/mL (ref 200–1100)

## 2023-01-15 LAB — MICROALBUMIN / CREATININE URINE RATIO
Creatinine, Urine: 43 mg/dL (ref 20–275)
Microalb Creat Ratio: 14 mg/g creat (ref <30)
Microalb, Ur: 0.6 mg/dL

## 2023-01-15 LAB — MICROSCOPIC MESSAGE

## 2023-02-12 ENCOUNTER — Ambulatory Visit
Admission: RE | Admit: 2023-02-12 | Discharge: 2023-02-12 | Disposition: A | Discharge status: Home / Self Care | Payer: 59 | Source: Ambulatory Visit | Attending: Nurse Practitioner | Admitting: Nurse Practitioner

## 2023-02-12 DIAGNOSIS — J449 Chronic obstructive pulmonary disease, unspecified: Secondary | ICD-10-CM

## 2023-02-12 DIAGNOSIS — Z720 Tobacco use: Secondary | ICD-10-CM

## 2023-02-20 ENCOUNTER — Encounter: Payer: Self-pay | Admitting: Nurse Practitioner

## 2023-03-04 ENCOUNTER — Encounter: Payer: Self-pay | Admitting: Nurse Practitioner

## 2023-03-04 ENCOUNTER — Ambulatory Visit: Payer: 59 | Admitting: Nurse Practitioner

## 2023-03-04 VITALS — BP 110/84 | HR 52 | Temp 97.7°F | Ht 65.0 in | Wt 246.2 lb

## 2023-03-04 DIAGNOSIS — I700 Atherosclerosis of aorta: Secondary | ICD-10-CM | POA: Diagnosis not present

## 2023-03-04 DIAGNOSIS — E119 Type 2 diabetes mellitus without complications: Secondary | ICD-10-CM

## 2023-03-04 DIAGNOSIS — Z79899 Other long term (current) drug therapy: Secondary | ICD-10-CM | POA: Diagnosis not present

## 2023-03-04 DIAGNOSIS — E782 Mixed hyperlipidemia: Secondary | ICD-10-CM | POA: Diagnosis not present

## 2023-03-04 MED ORDER — ATORVASTATIN CALCIUM 10 MG PO TABS: 10.0000 mg | ORAL_TABLET | Freq: Every day | ORAL | 2 refills | Status: AC

## 2023-03-04 MED ORDER — SEMAGLUTIDE(0.25 OR 0.5MG/DOS) 2 MG/3ML ~~LOC~~ SOPN: 0.2500 mg | PEN_INJECTOR | SUBCUTANEOUS | 2 refills | Status: DC

## 2023-03-04 NOTE — Progress Notes (Signed)
Assessment and Plan:  Sherry Dennis was seen today for an episodic visit.  Diagnoses and all order for this visit:  1. Type 2 diabetes mellitus without complication, without long-term current use of insulin (HCC) Start Ozempic as directed. Education: Reviewed 'ABCs' of diabetes management  Discussed goals to be met and/or maintained include A1C (<7) Blood pressure (<130/80) Cholesterol (LDL <70) Continue Eye Exam yearly  Continue Dental Exam Q6 mo Discussed dietary recommendations Discussed Physical Activity recommendations  Mixed hyperlipidemia/aortic atherosclerosis Start Atorvastatin Discussed lifestyle modifications. Recommended diet heavy in fruits and veggies, omega 3's. Decrease consumption of animal meats, cheeses, and dairy products. Remain active and exercise as tolerated. Continue to monitor. Check lipids/TSH  Medication management All medications discussed and reviewed in full. All questions and concerns regarding medications addressed.    Morbid Obesity Discussed appropriate BMI Diet modification. Physical activity. Encouraged/praised to build confidence.  Meds ordered this encounter  Medications   Semaglutide,0.25 or 0.5MG /DOS, 2 MG/3ML SOPN    Sig: Inject 0.25 mg into the skin once a week.    Dispense:  3 mL    Refill:  2    Order Specific Question:   Supervising Provider    Answer:   Lucky Cowboy [[id number]]   atorvastatin (LIPITOR) 10 MG tablet    Sig: Take 1 tablet (10 mg total) by mouth daily.    Dispense:  90 tablet    Refill:  2    Order Specific Question:   Supervising Provider    Answer:   Lucky Cowboy [phone]    Notify office for further evaluation and treatment, questions or concerns if any reported s/s fail to improve.   The patient was advised to call back or seek an in-person evaluation if any symptoms worsen or if the condition fails to improve as anticipated.   Further disposition pending results of labs. Discussed med's effects  and SE's.    I discussed the assessment and treatment plan with the patient. The patient was provided an opportunity to ask questions and all were answered. The patient agreed with the plan and demonstrated an understanding of the instructions.  Discussed med's effects and SE's. Screening labs and tests as requested with regular follow-up as recommended.  I provided 25 minutes of face-to-face time during this encounter including counseling, chart review, and critical decision making was preformed.  Today's Plan of Care is based on a patient-centered health care approach known as shared decision making - the decisions, tests and treatments allow for patient preferences and values to be balanced with clinical evidence.    Future Appointments  Date Time Provider Department Center  01/14/2024 10:00 AM Sherry Glimpse, NP [name] None    ------------------------------------------------------------------------------------------------------------------   HPI BP 110/84   Pulse (!) 52   Temp 97.7 F (36.5 C)   Ht 5\' 5"  (1.651 m)   Wt 246 lb 3.2 oz (111.7 kg)   SpO2 98%   BMI 40.97 kg/m   62 y.o.female presents for evaluation of medication management.  During last OV patient blood work revealed elevation in A1c from pre-diabetes to diabetes mellitus.   She is not currently on medications.  Has controlled by lifestyle but unsuccessful.  Lab Results  Component Value Date   HGBA1C 6.5 (H) 01/14/2023   She has elevation in LDL and aortic atherosclerosis.  She is not on cholesterol medication and is currently controlling cholesterol via lifestyle. Her cholesterol is at goal. The cholesterol last visit was:   Lab Results  Component Value Date  CHOL 180 01/14/2023   HDL 52 01/14/2023   LDLCALC 100 (H) 01/14/2023   TRIG 184 (H) 01/14/2023   CHOLHDL 3.5 01/14/2023   BMI is Body mass index is 40.97 kg/m., she has been working on diet and exercise. Wt Readings from Last 3 Encounters:   03/04/23 246 lb 3.2 oz (111.7 kg)  01/14/23 241 lb 6.4 oz (109.5 kg)  05/18/22 243 lb 12.8 oz (110.6 kg)    Past Medical History:  Diagnosis Date   Allergy    Anemia    Anxiety    Arthritis    "knees, hands, ankles, ~ every joint" (04/08/2015)   Basal cell carcinoma    "face, hands, chest" (04/08/2015)   Chronic bronchitis (HCC)    "get it pretty much q yr" (04/08/2015)   COPD (chronic obstructive pulmonary disease) (HCC)    Dysrhythmia    palpitations occ; SVT 09/2013 s/p adenosine   GERD (gastroesophageal reflux disease)    H/O hiatal hernia    Hx of cardiovascular stress test    Lexiscan Myoview 6/16: Ejection fraction is 60% and wall motion is normal. The study is normal. There is no scar or ischemia. This is a low risk scan.   Hyperlipidemia    Hypertension    Hypothyroidism    Palpitations    PONV (postoperative nausea and vomiting)    S/P total knee arthroplasty 02/15/2014   Sleep apnea    Thyroid disease    Vitamin D deficiency      Allergies  Allergen Reactions   Darvon [Propoxyphene Hcl] Anaphylaxis and Other (See Comments)    Airway swelling    Propoxyphene Anaphylaxis    Throat closes up   Doxycycline Nausea Only   Other     Any narcotic med but morphine makes pt nauseated    Tamiflu [Oseltamivir Phosphate] Hives and Other (See Comments)    Increased blood pressure    Current Outpatient Medications on File Prior to Visit  Medication Sig   abatacept (ORENCIA) 250 MG injection as directed Intravenous   acetaminophen (TYLENOL) 650 MG CR tablet Take 650 mg by mouth 3 (three) times daily as needed for pain.   bisoprolol-hydrochlorothiazide (ZIAC) 10-6.25 MG tablet TAKE 1 TABLET BY MOUTH EVERY MORNING FOR BLOOD PRESSURE   Budeson-Glycopyrrol-Formoterol (BREZTRI AEROSPHERE) 160-9-4.8 MCG/ACT AERO Inhale 1 Pump into the lungs in the morning and at bedtime.   busPIRone (BUSPAR) 10 MG tablet TAKE 1 TABLET 3 TIMES A DAY FOR ANXIETY **INS 30 DAY**   cyanocobalamin  (VITAMIN B12) 1000 MCG/ML injection INJECT ONCE WEEKLY FOR 6 WEEKS, THEN ONCE A MONTH   cyclobenzaprine (FLEXERIL) 10 MG tablet Take 1 tablet (10 mg total) by mouth 3 (three) times daily as needed for muscle spasms.   escitalopram (LEXAPRO) 20 MG tablet TAKE 1 TABLET BY MOUTH EVERY DAY   folic acid (FOLVITE) 1 MG tablet TAKE 2 TABLETS (2 MG TOTAL) BY MOUTH AT BEDTIME.   levocetirizine (XYZAL) 5 MG tablet Take 5 mg by mouth every evening.   levothyroxine (SYNTHROID) 137 MCG tablet TAKE 1 TABLET DAILY ON AN EMPTY STOMACH WITH ONLY WATER FOR 30 MINUTES & NO ANTACID MEDS, CALCIUM OR MAGNESIUM FOR 4 HOURS & AVOID BIOTIN   meclizine (ANTIVERT) 25 MG tablet Take 1 tablet (25 mg total) by mouth 3 (three) times daily as needed for dizziness.   methocarbamol (ROBAXIN) 500 MG tablet Take 500-1,000 mg by mouth every 6 (six) hours as needed.   methotrexate 50 MG/2ML injection Inject 150 mg  into the muscle once a week.   naproxen (NAPROSYN) 500 MG tablet Take 1 tablet (500 mg total) by mouth 2 (two) times daily with a meal.   olmesartan (BENICAR) 40 MG tablet TAKE 1 TABLET DAILY FOR BLOOD PRESSURE   ondansetron (ZOFRAN) 4 MG tablet Take 1 tablet (4 mg total) by mouth every 8 (eight) hours as needed for nausea or vomiting.   OVER THE COUNTER MEDICATION Apply 1 application topically 5 (five) times daily as needed (pain). CBD Cream   pantoprazole (PROTONIX) 20 MG tablet TAKE 1 TABLET DAILY FOR INDIGESTION & HEARTBURN   triamcinolone cream (KENALOG) 0.5 % Apply 1 Application topically 2 (two) times daily.   Vitamin D, Ergocalciferol, (DRISDOL) 1.25 MG (50000 UNIT) CAPS capsule TAKE 1 CAPSULE BY MOUTH 3 DAYS A WEEK FOR VITAMIN DEFICIENCY   Menthol, Topical Analgesic, (BIOFREEZE ROLL-ON EX) Apply 1 application topically daily as needed (pain). (Patient not taking: Reported on 03/04/2023)   No current facility-administered medications on file prior to visit.    ROS: all negative except what is noted in the  HPI.   Physical Exam:  BP 110/84   Pulse (!) 52   Temp 97.7 F (36.5 C)   Ht 5\' 5"  (1.651 m)   Wt 246 lb 3.2 oz (111.7 kg)   SpO2 98%   BMI 40.97 kg/m   General Appearance: NAD.  Awake, conversant and cooperative. Eyes: PERRLA, EOMs intact.  Sclera white.  Conjunctiva without erythema. Sinuses: No frontal/maxillary tenderness.  No nasal discharge. Nares patent.  ENT/Mouth: Ext aud canals clear.  Bilateral TMs w/DOL and without erythema or bulging. Hearing intact.  Posterior pharynx without swelling or exudate.  Tonsils without swelling or erythema.  Neck: Supple.  No masses, nodules or thyromegaly. Respiratory: Effort is regular with non-labored breathing. Breath sounds are equal bilaterally without rales, rhonchi, wheezing or stridor.  Cardio: RRR with no MRGs. Brisk peripheral pulses without edema.  Abdomen: Active BS in all four quadrants.  Soft and non-tender without guarding, rebound tenderness, hernias or masses. Lymphatics: Non tender without lymphadenopathy.  Musculoskeletal: Full ROM, 5/5 strength, normal ambulation.  No clubbing or cyanosis. Skin: Appropriate color for ethnicity. Warm without rashes, lesions, ecchymosis, ulcers.  Neuro: CN II-XII grossly normal. Normal muscle tone without cerebellar symptoms and intact sensation.   Psych: AO X 3,  appropriate mood and affect, insight and judgment.     Sherry Glimpse, NP 10:13 AM [facility]

## 2023-03-04 NOTE — Patient Instructions (Signed)
Atorvastatin Tablets What is this medication? ATORVASTATIN (a TORE va sta tin) treats high cholesterol and reduces the risk of heart attack and stroke. It works by decreasing bad cholesterol and fats (such as LDL, triglycerides) and increasing good cholesterol (HDL) in your blood. It belongs to a group of medications called statins. Changes to diet and exercise are often combined with this medication. This medicine may be used for other purposes; ask your health care provider or pharmacist if you have questions. COMMON BRAND NAME(S): Lipitor What should I tell my care team before I take this medication? They need to know if you have any of these conditions: Diabetes Frequently drink alcohol Kidney disease Liver disease Muscle cramps, pain Stroke Thyroid disease An unusual or allergic reaction to atorvastatin, other medications, foods, dyes, or preservatives Pregnant or trying to get pregnant Breastfeeding How should I use this medication? Take this medication by mouth. Take it as directed on the label at the same time every day. You can take it with or without food. If it upsets your stomach, take it with food. Keep taking it unless your care team tells you to stop. Do not take this medication with grapefruit juice. Talk to your care team about the use of this medication in children. While it may be prescribed for children as young as 10 years for selected conditions, precautions do apply. Overdosage: If you think you have taken too much of this medicine contact a poison control center or emergency room at once. NOTE: This medicine is only for you. Do not share this medicine with others. What if I miss a dose? If you miss a dose, take it as soon as you can unless it is more than 12 hours late. If it is more than 12 hours late, skip the missed dose. Take the next dose at the normal time. If it is almost time for your next dose, take only that dose. Do not take double or extra doses. What may  interact with this medication? Do not take this medication with any of the following: Dasabuvir; ombitasvir; paritaprevir; ritonavir Lonafarnib Ombitasvir; paritaprevir; ritonavir Posaconazole Red yeast rice This medication may also interact with the following: Alcohol Certain antibiotics, such as erythromycin, clarithromycin Certain antivirals for HIV or hepatitis Certain medications for cholesterol, such as fenofibrate, gemfibrozil, niacin Certain medications for fungal infections, such as ketoconazole, itraconazole, voriconazole Colchicine Cyclosporine Digoxin Estrogen or progestin hormones Grapefruit juice Rifampin This list may not describe all possible interactions. Give your health care provider a list of all the medicines, herbs, non-prescription drugs, or dietary supplements you use. Also tell them if you smoke, drink alcohol, or use illegal drugs. Some items may interact with your medicine. What should I watch for while using this medication? Visit your care team for regular checks on your progress. Tell your care team if your symptoms do not start to get better or if they get worse. Your care team may tell you to stop taking this medication if you develop muscle problems. If your muscle problems do not go away after stopping this medication, contact your care team. This medication may increase blood sugar. The risk may be higher in patients who already have diabetes. Ask your care team what you can do to lower your risk of diabetes while on this medication. If you are going to need surgery or other procedure, tell your care team that you are using this medication. Taking this medication is only part of a total heart healthy program. Ask your  care team if there are other changes you can make to improve your overall health. Drinking more than 2 alcoholic drinks every day with this medication can increase the risk of side effects. Talk to your care team if you wish to become  pregnant or think you might be pregnant. This medication can cause serious birth defects. Talk to your care team before breastfeeding. Changes to your treatment plan may be needed. What side effects may I notice from receiving this medication? Side effects that you should report to your care team as soon as possible: Allergic reactions--skin rash, itching, hives, swelling of the face, lips, tongue, or throat High blood sugar (hyperglycemia)--increased thirst or amount of urine, unusual weakness or fatigue, blurry vision Liver injury--right upper belly pain, loss of appetite, nausea, light-colored stool, dark yellow or brown urine, yellowing skin or eyes, unusual weakness or fatigue Muscle injury--unusual weakness or fatigue, muscle pain, dark yellow or brown urine, decrease in amount of urine Redness, blistering, peeling, or loosening of the skin, including inside the mouth Side effects that usually do not require medical attention (report to your care team if they continue or are bothersome): Diarrhea Nausea Trouble sleeping Upset stomach This list may not describe all possible side effects. Call your doctor for medical advice about side effects. You may report side effects to FDA at 1-800-FDA-1088. Where should I keep my medication? Keep out of the reach of children and pets. Store at room temperature between 20 and 25 degrees C (68 and 77 degrees F). Get rid of any unused medication after the expiration date. To get rid of medications that are no longer needed or have expired: Take the medication to a medication take-back program. Check with your pharmacy or law enforcement to find a location. If you cannot return the medication, check the label or package insert to see if the medication should be thrown out in the garbage or flushed down the toilet. If you are not sure, ask your care team. If it is safe to put it in the trash, take the medication out of the container. Mix the medication with  cat litter, dirt, coffee grounds, or other unwanted substance. Seal the mixture in a bag or container. Put it in the trash. NOTE: This sheet is a summary. It may not cover all possible information. If you have questions about this medicine, talk to your doctor, pharmacist, or health care provider.  2024 Elsevier/Gold Standard (2022-02-19 00:00:00)    Semaglutide Injection What is this medication? SEMAGLUTIDE (SEM a GLOO tide) treats type 2 diabetes. It works by increasing insulin levels in your body, which decreases your blood sugar (glucose).   It also reduces the amount of sugar released into your blood and slows down your digestion. It may also be used to lower the risk of heart attack and stroke in people with type 2 diabetes. Changes to diet and exercise are often combined with this medication. This medicine may be used for other purposes; ask your health care provider or pharmacist if you have questions. COMMON BRAND NAME(S): OZEMPIC What should I tell my care team before I take this medication? They need to know if you have any of these conditions: Endocrine tumors (MEN 2) or if someone in your family had these tumors Eye disease, vision problems History of pancreatitis Kidney disease Stomach problems Thyroid cancer or if someone in your family had thyroid cancer An unusual or allergic reaction to semaglutide, other medications, foods, dyes, or preservatives Pregnant or trying to  get pregnant Breast-feeding How should I use this medication? This medication is for injection under the skin of your upper leg (thigh), stomach area, or upper arm. It is given once every week (every 7 days). You will be taught how to prepare and give this medication. Use exactly as directed. Take your medication at regular intervals. Do not take it more often than directed. If you use this medication with insulin, you should inject this medication and the insulin separately. Do not mix them together. Do not  give the injections right next to each other. Change (rotate) injection sites with each injection. It is important that you put your used needles and syringes in a special sharps container. Do not put them in a trash can. If you do not have a sharps container, call your pharmacist or care team to get one. A special MedGuide will be given to you by the pharmacist with each prescription and refill. Be sure to read this information carefully each time. This medication comes with INSTRUCTIONS FOR USE. Ask your pharmacist for directions on how to use this medication. Read the information carefully. Talk to your pharmacist or care team if you have questions. Talk to your care team about the use of this medication in children. Special care may be needed. Overdosage: If you think you have taken too much of this medicine contact a poison control center or emergency room at once. NOTE: This medicine is only for you. Do not share this medicine with others. What if I miss a dose? If you miss a dose, take it as soon as you can within 5 days after the missed dose. Then take your next dose at your regular weekly time. If it has been longer than 5 days after the missed dose, do not take the missed dose. Take the next dose at your regular time. Do not take double or extra doses. If you have questions about a missed dose, contact your care team for advice. What may interact with this medication? Other medications for diabetes Many medications may cause changes in blood sugar, these include: Alcohol containing beverages Antiviral medications for HIV or AIDS Aspirin and aspirin-like medications Certain medications for blood pressure, heart disease, irregular heart beat Chromium Diuretics Female hormones, such as estrogens or progestins, birth control pills Fenofibrate Gemfibrozil Isoniazid Lanreotide Female hormones or anabolic steroids MAOIs like Carbex, Eldepryl, Marplan, Nardil, and Parnate Medications for  weight loss Medications for allergies, asthma, cold, or cough Medications for depression, anxiety, or psychotic disturbances Niacin Nicotine NSAIDs, medications for pain and inflammation, like ibuprofen or naproxen Octreotide Pasireotide Pentamidine Phenytoin Probenecid Quinolone antibiotics such as ciprofloxacin, levofloxacin, ofloxacin Some herbal dietary supplements Steroid medications such as prednisone or cortisone Sulfamethoxazole; trimethoprim Thyroid hormones Some medications can hide the warning symptoms of low blood sugar (hypoglycemia). You may need to monitor your blood sugar more closely if you are taking one of these medications. These include: Beta-blockers, often used for high blood pressure or heart problems (examples include atenolol, metoprolol, propranolol) Clonidine Guanethidine Reserpine This list may not describe all possible interactions. Give your health care provider a list of all the medicines, herbs, non-prescription drugs, or dietary supplements you use. Also tell them if you smoke, drink alcohol, or use illegal drugs. Some items may interact with your medicine. What should I watch for while using this medication? Visit your care team for regular checks on your progress. Drink plenty of fluids while taking this medication. Check with your care team if you get  an attack of severe diarrhea, nausea, and vomiting. The loss of too much body fluid can make it dangerous for you to take this medication. A test called the HbA1C (A1C) will be monitored. This is a simple blood test. It measures your blood sugar control over the last 2 to 3 months. You will receive this test every 3 to 6 months. Learn how to check your blood sugar. Learn the symptoms of low and high blood sugar and how to manage them. Always carry a quick-source of sugar with you in case you have symptoms of low blood sugar. Examples include hard sugar candy or glucose tablets. Make sure others know that  you can choke if you eat or drink when you develop serious symptoms of low blood sugar, such as seizures or unconsciousness. They must get medical help at once. Tell your care team if you have high blood sugar. You might need to change the dose of your medication. If you are sick or exercising more than usual, you might need to change the dose of your medication. Do not skip meals. Ask your care team if you should avoid alcohol. Many nonprescription cough and cold products contain sugar or alcohol. These can affect blood sugar. Pens should never be shared. Even if the needle is changed, sharing may result in passing of viruses like hepatitis or HIV. Wear a medical ID bracelet or chain, and carry a card that describes your disease and details of your medication and dosage times. What side effects may I notice from receiving this medication? Side effects that you should report to your care team as soon as possible: Allergic reactions--skin rash, itching, hives, swelling of the face, lips, tongue, or throat Change in vision Dehydration--increased thirst, dry mouth, feeling faint or lightheaded, headache, dark yellow or brown urine Gallbladder problems--severe stomach pain, nausea, vomiting, fever Heart palpitations--rapid, pounding, or irregular heartbeat Kidney injury--decrease in the amount of urine, swelling of the ankles, hands, or feet Pancreatitis--severe stomach pain that spreads to your back or gets worse after eating or when touched, fever, nausea, vomiting Thoughts of suicide or self-harm, worsening mood, feelings of depression Thyroid cancer--new mass or lump in the neck, pain or trouble swallowing, trouble breathing, hoarseness Side effects that usually do not require medical attention (report these to your care team if they continue or are bothersome): Diarrhea Loss of appetite Nausea Upset stomach This list may not describe all possible side effects. Call your doctor for medical  advice about side effects. You may report side effects to FDA at 1-800-FDA-1088. Where should I keep my medication? Keep out of the reach of children. Store unopened pens in a refrigerator between 2 and 8 degrees C (36 and 46 degrees F). Do not freeze. Protect from light and heat. After you first use the pen, it can be stored for 56 days at room temperature between 15 and 30 degrees C (59 and 86 degrees F) or in a refrigerator. Throw away your used pen after 56 days or after the expiration date, whichever comes first. Do not store your pen with the needle attached. If the needle is left on, medication may leak from the pen. NOTE: This sheet is a summary. It may not cover all possible information. If you have questions about this medicine, talk to your doctor, pharmacist, or health care provider.  2024 Elsevier/Gold Standard (2022-11-05 00:00:00)

## 2023-03-21 ENCOUNTER — Encounter (HOSPITAL_BASED_OUTPATIENT_CLINIC_OR_DEPARTMENT_OTHER): Payer: Self-pay | Admitting: Emergency Medicine

## 2023-03-21 ENCOUNTER — Emergency Department (HOSPITAL_BASED_OUTPATIENT_CLINIC_OR_DEPARTMENT_OTHER): Payer: 59

## 2023-03-21 ENCOUNTER — Other Ambulatory Visit: Payer: Self-pay

## 2023-03-21 ENCOUNTER — Emergency Department (HOSPITAL_BASED_OUTPATIENT_CLINIC_OR_DEPARTMENT_OTHER): Payer: 59 | Admitting: Radiology

## 2023-03-21 ENCOUNTER — Emergency Department (HOSPITAL_BASED_OUTPATIENT_CLINIC_OR_DEPARTMENT_OTHER)
Admission: EM | Admit: 2023-03-21 | Discharge: 2023-03-21 | Disposition: A | Discharge status: Home / Self Care | Payer: Worker's Compensation | Attending: Emergency Medicine | Admitting: Emergency Medicine

## 2023-03-21 DIAGNOSIS — S62646A Nondisplaced fracture of proximal phalanx of right little finger, initial encounter for closed fracture: Secondary | ICD-10-CM | POA: Insufficient documentation

## 2023-03-21 DIAGNOSIS — I10 Essential (primary) hypertension: Secondary | ICD-10-CM | POA: Diagnosis not present

## 2023-03-21 DIAGNOSIS — S62366A Nondisplaced fracture of neck of fifth metacarpal bone, right hand, initial encounter for closed fracture: Secondary | ICD-10-CM | POA: Diagnosis not present

## 2023-03-21 DIAGNOSIS — Y92481 Parking lot as the place of occurrence of the external cause: Secondary | ICD-10-CM | POA: Diagnosis not present

## 2023-03-21 DIAGNOSIS — S60511A Abrasion of right hand, initial encounter: Secondary | ICD-10-CM

## 2023-03-21 DIAGNOSIS — Z79899 Other long term (current) drug therapy: Secondary | ICD-10-CM | POA: Insufficient documentation

## 2023-03-21 DIAGNOSIS — S6991XA Unspecified injury of right wrist, hand and finger(s), initial encounter: Secondary | ICD-10-CM | POA: Diagnosis present

## 2023-03-21 DIAGNOSIS — S0990XA Unspecified injury of head, initial encounter: Secondary | ICD-10-CM | POA: Insufficient documentation

## 2023-03-21 DIAGNOSIS — W010XXA Fall on same level from slipping, tripping and stumbling without subsequent striking against object, initial encounter: Secondary | ICD-10-CM | POA: Insufficient documentation

## 2023-03-21 DIAGNOSIS — S62644A Nondisplaced fracture of proximal phalanx of right ring finger, initial encounter for closed fracture: Secondary | ICD-10-CM | POA: Diagnosis not present

## 2023-03-21 DIAGNOSIS — S6291XA Unspecified fracture of right wrist and hand, initial encounter for closed fracture: Secondary | ICD-10-CM

## 2023-03-21 NOTE — ED Notes (Signed)
Pt given discharge instructions. Opportunities given for questions. Pt verbalizes understanding. , R, RN 

## 2023-03-21 NOTE — Discharge Instructions (Signed)
Please read and follow all provided instructions.  Your diagnoses today include:  1. Closed fracture of right hand, initial encounter   2. Abrasion of right hand, initial encounter   3. Minor head injury, initial encounter     Tests performed today include: An x-ray of the affected area -shows 3 broken bones in the hand Head CT: No internal injuries noted Vital signs. See below for your results today.   Medications prescribed:  None  Take any prescribed medications only as directed.  Home care instructions:  Follow any educational materials contained in this packet Follow R.I.C.E. Protocol: R - rest your injury  I  - use ice on injury without applying directly to skin C - compress injury with bandage or splint E - elevate the injury as much as possible  Follow-up instructions: Please follow-up with your orthopedic physician (bone specialist).  Call this afternoon or tomorrow for an appointment.  Return instructions:  Please return if your fingers are numb or tingling, appear gray or blue, or you have severe pain (also elevate the arm and loosen splint or wrap if you were given one) Please return to the Emergency Department if you experience worsening symptoms.  Please return if you have any other emergent concerns.  Additional Information:  Your vital signs today were: BP (!) 137/97 (BP Location: Right Arm)   Pulse 60   Temp 98.3 F (36.8 C) (Oral)   Resp 15   Ht 5\' 5"  (1.651 m)   Wt 108.9 kg   SpO2 100%   BMI 39.94 kg/m  If your blood pressure (BP) was elevated above 135/85 this visit, please have this repeated by your doctor within one month. --------------

## 2023-03-21 NOTE — ED Provider Notes (Signed)
EMERGENCY DEPARTMENT AT [facility] Provider Note   CSN: [id number] Arrival date & time: 03/21/23  1212     History  Chief Complaint  Patient presents with   Head Injury   Hand Injury    ARTHER SARNO is a 62 y.o. female.  Patient with history of rheumatoid arthritis, hypertension, hyperlipidemia --presents to the emergency department today for evaluation of injury sustained during a fall.  Patient tripped over a curb in a parking lot and fell.  She states that she caught herself with her right hand and her fingers bent back.  She also hit her right forehead area.  She felt nauseous and dizzy for a few minutes after falling, however this has resolved without vomiting or confusion.  No anticoagulation.  No neck pain.  No weakness, numbness, or tingling in the extremities.  She has not taken any medications prior to arrival.  She did sustain a cut on her right hand.  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       Home Medications Prior to Admission medications   Medication Sig Start Date End Date Taking? Authorizing Provider  abatacept (ORENCIA) 250 MG injection as directed Intravenous    [provider]  acetaminophen (TYLENOL) 650 MG CR tablet Take 650 mg by mouth 3 (three) times daily as needed for pain.    [provider]  atorvastatin (LIPITOR) 10 MG tablet Take 1 tablet (10 mg total) by mouth daily. 03/04/23   Adela Glimpse, NP  bisoprolol-hydrochlorothiazide (ZIAC) 10-6.25 MG tablet TAKE 1 TABLET BY MOUTH EVERY MORNING FOR BLOOD PRESSURE 12/30/22   Adela Glimpse, NP  Budeson-Glycopyrrol-Formoterol (BREZTRI AEROSPHERE) 160-9-4.8 MCG/ACT AERO Inhale 1 Pump into the lungs in the morning and at bedtime. 01/22/20   Doree Albee, PA-C  busPIRone (BUSPAR) 10 MG tablet TAKE 1 TABLET 3 TIMES A DAY FOR ANXIETY **INS 30 DAY** 05/28/22   Raynelle Dick, NP   cyanocobalamin (VITAMIN B12) 1000 MCG/ML injection INJECT ONCE WEEKLY FOR 6 WEEKS, THEN ONCE A MONTH 10/17/22   Raynelle Dick, NP  cyclobenzaprine (FLEXERIL) 10 MG tablet Take 1 tablet (10 mg total) by mouth 3 (three) times daily as needed for muscle spasms. 04/27/21   Shuford, French Ana, PA-C  escitalopram (LEXAPRO) 20 MG tablet TAKE 1 TABLET BY MOUTH EVERY DAY 06/26/22   Raynelle Dick, NP  folic acid (FOLVITE) 1 MG tablet TAKE 2 TABLETS (2 MG TOTAL) BY MOUTH AT BEDTIME. 07/02/22   Raynelle Dick, NP  levocetirizine (XYZAL) 5 MG tablet Take 5 mg by mouth every evening.    [provider]  levothyroxine (SYNTHROID) 137 MCG tablet TAKE 1 TABLET DAILY ON AN EMPTY STOMACH WITH ONLY WATER FOR 30 MINUTES & NO ANTACID MEDS, CALCIUM OR MAGNESIUM FOR 4 HOURS & AVOID BIOTIN 05/29/22   Raynelle Dick, NP  meclizine (ANTIVERT) 25 MG tablet Take 1 tablet (25 mg total) by mouth 3 (three) times daily as needed for dizziness. 10/06/19   Long, Arlyss Repress, MD  Menthol, Topical Analgesic, (BIOFREEZE ROLL-ON EX) Apply 1 application topically daily as needed (pain). Patient not taking: Reported on 03/04/2023    [provider]  methocarbamol (ROBAXIN) 500 MG tablet Take 500-1,000 mg by mouth every 6 (six) hours as needed.    [provider]  methotrexate 50 MG/2ML injection Inject 150 mg into the muscle once a week. 10/05/19  [provider]  naproxen (NAPROSYN) 500 MG tablet Take 1 tablet (500 mg total) by mouth 2 (two) times daily with a meal. 04/27/21   Shuford, French Ana, PA-C  olmesartan (BENICAR) 40 MG tablet TAKE 1 TABLET DAILY FOR BLOOD PRESSURE 04/30/22   Raynelle Dick, NP  ondansetron (ZOFRAN) 4 MG tablet Take 1 tablet (4 mg total) by mouth every 8 (eight) hours as needed for nausea or vomiting. 04/27/21   Shuford, French Ana, PA-C  OVER THE COUNTER MEDICATION Apply 1 application topically 5 (five) times daily as needed (pain). CBD Cream    [provider]   pantoprazole (PROTONIX) 20 MG tablet TAKE 1 TABLET DAILY FOR INDIGESTION & HEARTBURN 07/02/22   Raynelle Dick, NP  Semaglutide,0.25 or 0.5MG /DOS, 2 MG/3ML SOPN Inject 0.25 mg into the skin once a week. 03/04/23   Adela Glimpse, NP  triamcinolone cream (KENALOG) 0.5 % Apply 1 Application topically 2 (two) times daily. 01/14/23   Adela Glimpse, NP  Vitamin D, Ergocalciferol, (DRISDOL) 1.25 MG (50000 UNIT) CAPS capsule TAKE 1 CAPSULE BY MOUTH 3 DAYS A WEEK FOR VITAMIN DEFICIENCY 05/03/21   Raynelle Dick, NP      Allergies    Darvon [propoxyphene hcl], Propoxyphene, Doxycycline, Hydromorphone, Oseltamivir, Other, Oxycodone-acetaminophen, and Tamiflu [oseltamivir phosphate]    Review of Systems   Review of Systems  Physical Exam Updated Vital Signs BP (!) 144/85 (BP Location: Left Arm)   Pulse (!) 55   Temp 98.3 F (36.8 C) (Oral)   Resp 16   Ht 5\' 5"  (1.651 m)   Wt 108.9 kg   SpO2 98%   BMI 39.94 kg/m   Physical Exam Vitals and nursing note reviewed.  Constitutional:      Appearance: She is well-developed.  HENT:     Head: Normocephalic. No raccoon eyes or Battle's sign.     Comments: Mild abrasion right forehead    Right Ear: Tympanic membrane, ear canal and external ear normal. No hemotympanum.     Left Ear: Tympanic membrane, ear canal and external ear normal. No hemotympanum.     Nose: Nose normal.     Mouth/Throat:     Pharynx: Uvula midline.  Eyes:     General: Lids are normal.     Extraocular Movements:     Right eye: No nystagmus.     Left eye: No nystagmus.     Conjunctiva/sclera: Conjunctivae normal.     Pupils: Pupils are equal, round, and reactive to light.     Comments: No visible hyphema noted  Cardiovascular:     Rate and Rhythm: Normal rate and regular rhythm.  Pulmonary:     Effort: Pulmonary effort is normal.     Breath sounds: Normal breath sounds.  Abdominal:     Palpations: Abdomen is soft.     Tenderness: There is no abdominal  tenderness.  Musculoskeletal:     Right wrist: No tenderness. Normal range of motion.     Left wrist: No tenderness. Normal range of motion.     Cervical back: Normal range of motion and neck supple. No tenderness or bony tenderness.     Thoracic back: No tenderness or bony tenderness.     Lumbar back: No tenderness or bony tenderness.  Skin:    General: Skin is warm and dry.     Comments: Right hand: 2 cm laceration to the palm of the right hand with Prolene sutures in place.  Wound is approximated.  No active bleeding.  There  is swelling and tenderness over the dorsum of the hand with ecchymosis noted at the base of the long finger.  Neurological:     Mental Status: She is alert and oriented to person, place, and time.     GCS: GCS eye subscore is 4. GCS verbal subscore is 5. GCS motor subscore is 6.     Cranial Nerves: No cranial nerve deficit.     Sensory: No sensory deficit.     Coordination: Coordination normal.     ED Results / Procedures / Treatments   Labs (all labs ordered are listed, but only abnormal results are displayed) Labs Reviewed - No data to display  EKG None  Radiology DG Hand Complete Right  Result Date: 03/21/2023 CLINICAL DATA:  Fall, right hand pain EXAM: RIGHT HAND - COMPLETE 3+ VIEW COMPARISON:  None Available. FINDINGS: Acute fractures of the proximal metaphyses of the ring finger and small finger proximal phalanxes. The small finger fracture is mildly impacted with slight dorsal angulation. Ring finger fracture is nondisplaced. Additional nondisplaced fracture of the fifth metacarpal neck. No additional fractures. No dislocation. There is soft tissue swelling of the hand. IMPRESSION: 1. Acute fractures of the ring finger and small finger proximal phalanxes. 2. Nondisplaced fracture of the fifth metacarpal neck. Electronically Signed   By: Duanne Guess D.O.   On: 03/21/2023 13:05   CT Head Wo Contrast  Result Date: 03/21/2023 CLINICAL DATA:  Head  trauma, moderate-severe EXAM: CT HEAD WITHOUT CONTRAST TECHNIQUE: Contiguous axial images were obtained from the base of the skull through the vertex without intravenous contrast. RADIATION DOSE REDUCTION: This exam was performed according to the departmental dose-optimization program which includes automated exposure control, adjustment of the mA and/or kV according to patient size and/or use of iterative reconstruction technique. COMPARISON:  None Available. FINDINGS: Brain: No evidence of acute infarction, hemorrhage, hydrocephalus, extra-axial collection or mass lesion/mass effect. Vascular: No hyperdense vessel or unexpected calcification. Skull: Normal. Negative for fracture or focal lesion. Sinuses/Orbits: No acute finding. Other: Anterior right frontal soft tissue swelling. IMPRESSION: 1. No acute intracranial abnormality. 2. Anterior right frontal soft tissue swelling. No underlying calvarial fracture. Electronically Signed   By: Duanne Guess D.O.   On: 03/21/2023 13:03    Procedures Procedures    Medications Ordered in ED Medications - No data to display  ED Course/ Medical Decision Making/ A&P    Patient seen and examined. History obtained directly from patient.   Labs/EKG: None ordered  Imaging: X-ray of the right hand, CT of the head  Medications/Fluids: None ordered  Most recent vital signs reviewed and are as follows: BP (!) 144/85 (BP Location: Left Arm)   Pulse (!) 55   Temp 98.3 F (36.8 C) (Oral)   Resp 16   Ht 5\' 5"  (1.651 m)   Wt 108.9 kg   SpO2 98%   BMI 39.94 kg/m   Initial impression: Hand contusion and sprain, rule out fracture.  Minor head injury.  Discussed imaging with patient at bedside.  Given subsequent nausea and dizziness, I recommend head CT and patient agrees.  1:39 PM Reassessment performed. Patient appears stable.  Ulnar gutter splint has been placed.  Distal circulation, motor, and sensation intact after placement.  Imaging personally  visualized and interpreted including: CT of the head, agree negative.  X-ray of the hand agree fourth and fifth proximal phalanx fracture, fifth metacarpal fracture, closed.  Reviewed pertinent lab work and imaging with patient at bedside. Questions answered.   Most  current vital signs reviewed and are as follows: BP (!) 137/97 (BP Location: Right Arm)   Pulse 60   Temp 98.3 F (36.8 C) (Oral)   Resp 15   Ht 5\' 5"  (1.651 m)   Wt 108.9 kg   SpO2 100%   BMI 39.94 kg/m   Plan: Discharge to home.   Prescriptions written for: None  Other home care instructions discussed: RICE protocol, OTC meds.  Patient does have morphine IR that she has for more severe chronic pain at home.  ED return instructions discussed: New or worsening symptoms  Follow-up instructions discussed: Patient encouraged to follow-up with their orthopedist in the next 3 to 5 days.                                  Medical Decision Making Amount and/or Complexity of Data Reviewed Radiology: ordered.   Patient with a slip and fall today.  Patient has 3 fractures in her hand.  Also negative head CT.  Patient splinted.  She had a laceration repaired on her home prior to arrival.  This is not overlying the fifth metacarpal fracture and I do not suspect that this is open.        Final Clinical Impression(s) / ED Diagnoses Final diagnoses:  Closed fracture of right hand, initial encounter  Abrasion of right hand, initial encounter  Minor head injury, initial encounter    Rx / DC Orders ED Discharge Orders     None         Renne Crigler, PA-C 03/21/23 1341    Gwyneth Sprout, MD 03/26/23 1452

## 2023-03-21 NOTE — ED Triage Notes (Signed)
Pt arrives to ED with c/o head and right hand injury after falling on a sidewalk today.

## 2023-03-27 ENCOUNTER — Other Ambulatory Visit: Payer: Self-pay | Admitting: Orthopedic Surgery

## 2023-03-27 ENCOUNTER — Encounter (HOSPITAL_BASED_OUTPATIENT_CLINIC_OR_DEPARTMENT_OTHER): Payer: Self-pay | Admitting: Orthopedic Surgery

## 2023-03-27 ENCOUNTER — Other Ambulatory Visit: Payer: Self-pay

## 2023-03-28 ENCOUNTER — Ambulatory Visit (HOSPITAL_BASED_OUTPATIENT_CLINIC_OR_DEPARTMENT_OTHER): Payer: 59

## 2023-03-28 ENCOUNTER — Encounter (HOSPITAL_BASED_OUTPATIENT_CLINIC_OR_DEPARTMENT_OTHER)
Admission: RE | Disposition: A | Discharge status: Home / Self Care | Payer: Self-pay | Source: Home / Self Care | Attending: Orthopedic Surgery

## 2023-03-28 ENCOUNTER — Other Ambulatory Visit: Payer: Self-pay

## 2023-03-28 ENCOUNTER — Ambulatory Visit (HOSPITAL_BASED_OUTPATIENT_CLINIC_OR_DEPARTMENT_OTHER): Payer: 59 | Admitting: Anesthesiology

## 2023-03-28 ENCOUNTER — Ambulatory Visit (HOSPITAL_BASED_OUTPATIENT_CLINIC_OR_DEPARTMENT_OTHER)
Admission: RE | Admit: 2023-03-28 | Discharge: 2023-03-28 | Disposition: A | Discharge status: Home / Self Care | Payer: 59 | Attending: Orthopedic Surgery | Admitting: Orthopedic Surgery

## 2023-03-28 ENCOUNTER — Encounter (HOSPITAL_BASED_OUTPATIENT_CLINIC_OR_DEPARTMENT_OTHER): Payer: Self-pay | Admitting: Orthopedic Surgery

## 2023-03-28 DIAGNOSIS — F17210 Nicotine dependence, cigarettes, uncomplicated: Secondary | ICD-10-CM | POA: Diagnosis not present

## 2023-03-28 DIAGNOSIS — Z8249 Family history of ischemic heart disease and other diseases of the circulatory system: Secondary | ICD-10-CM | POA: Insufficient documentation

## 2023-03-28 DIAGNOSIS — M1990 Unspecified osteoarthritis, unspecified site: Secondary | ICD-10-CM | POA: Diagnosis not present

## 2023-03-28 DIAGNOSIS — K219 Gastro-esophageal reflux disease without esophagitis: Secondary | ICD-10-CM | POA: Diagnosis not present

## 2023-03-28 DIAGNOSIS — Z6841 Body Mass Index (BMI) 40.0 and over, adult: Secondary | ICD-10-CM | POA: Diagnosis not present

## 2023-03-28 DIAGNOSIS — E039 Hypothyroidism, unspecified: Secondary | ICD-10-CM | POA: Insufficient documentation

## 2023-03-28 DIAGNOSIS — S62614A Displaced fracture of proximal phalanx of right ring finger, initial encounter for closed fracture: Secondary | ICD-10-CM | POA: Diagnosis not present

## 2023-03-28 DIAGNOSIS — S62336A Displaced fracture of neck of fifth metacarpal bone, right hand, initial encounter for closed fracture: Secondary | ICD-10-CM | POA: Insufficient documentation

## 2023-03-28 DIAGNOSIS — I10 Essential (primary) hypertension: Secondary | ICD-10-CM

## 2023-03-28 DIAGNOSIS — I471 Supraventricular tachycardia, unspecified: Secondary | ICD-10-CM | POA: Diagnosis not present

## 2023-03-28 DIAGNOSIS — Z79899 Other long term (current) drug therapy: Secondary | ICD-10-CM | POA: Diagnosis not present

## 2023-03-28 DIAGNOSIS — S62616A Displaced fracture of proximal phalanx of right little finger, initial encounter for closed fracture: Secondary | ICD-10-CM | POA: Insufficient documentation

## 2023-03-28 DIAGNOSIS — G4730 Sleep apnea, unspecified: Secondary | ICD-10-CM | POA: Insufficient documentation

## 2023-03-28 DIAGNOSIS — J449 Chronic obstructive pulmonary disease, unspecified: Secondary | ICD-10-CM | POA: Insufficient documentation

## 2023-03-28 DIAGNOSIS — F419 Anxiety disorder, unspecified: Secondary | ICD-10-CM | POA: Diagnosis not present

## 2023-03-28 DIAGNOSIS — W19XXXA Unspecified fall, initial encounter: Secondary | ICD-10-CM | POA: Insufficient documentation

## 2023-03-28 HISTORY — PX: CLOSED REDUCTION FINGER WITH PERCUTANEOUS PINNING: SHX5612

## 2023-03-28 LAB — BASIC METABOLIC PANEL
Anion gap: 11 (ref 5–15)
BUN: 12 mg/dL (ref 6–20)
CO2: 22 mmol/L (ref 22–32)
Calcium: 9.2 mg/dL (ref 8.9–10.3)
Chloride: 106 mmol/L (ref 98–111)
Creatinine, Ser: 1.09 mg/dL — ABNORMAL HIGH (ref 0.44–1.00)
GFR, Estimated: 58 mL/min — ABNORMAL LOW (ref >60)
Glucose, Bld: 103 mg/dL — ABNORMAL HIGH (ref 70–99)
Potassium: 3.7 mmol/L (ref 3.5–5.1)
Sodium: 139 mmol/L (ref 135–145)

## 2023-03-28 SURGERY — CLOSED REDUCTION, FINGER, WITH PERCUTANEOUS PINNING
Anesthesia: General | Site: Finger | Laterality: Right

## 2023-03-28 MED ORDER — LIDOCAINE 2% (20 MG/ML) 5 ML SYRINGE
INTRAMUSCULAR | Status: AC
  Filled 2023-03-28: qty 5

## 2023-03-28 MED ORDER — DEXAMETHASONE SODIUM PHOSPHATE 4 MG/ML IJ SOLN
INTRAMUSCULAR | Status: DC | PRN
  Administered 2023-03-28: 5 mg via INTRAVENOUS

## 2023-03-28 MED ORDER — FENTANYL CITRATE (PF) 100 MCG/2ML IJ SOLN: 25.0000 ug | INTRAMUSCULAR | Status: DC | PRN

## 2023-03-28 MED ORDER — ACETAMINOPHEN 500 MG PO TABS
1000.0000 mg | ORAL_TABLET | Freq: Once | ORAL | Status: AC
  Administered 2023-03-28: 1000 mg via ORAL

## 2023-03-28 MED ORDER — FENTANYL CITRATE (PF) 100 MCG/2ML IJ SOLN
INTRAMUSCULAR | Status: AC
  Filled 2023-03-28: qty 2

## 2023-03-28 MED ORDER — BUPIVACAINE HCL (PF) 0.25 % IJ SOLN
INTRAMUSCULAR | Status: DC | PRN
  Administered 2023-03-28: 9 mL

## 2023-03-28 MED ORDER — TRAMADOL HCL 50 MG PO TABS: ORAL_TABLET | ORAL | 0 refills | Status: DC

## 2023-03-28 MED ORDER — ACETAMINOPHEN 500 MG PO TABS
ORAL_TABLET | ORAL | Status: AC
  Filled 2023-03-28: qty 2

## 2023-03-28 MED ORDER — PROPOFOL 10 MG/ML IV BOLUS
INTRAVENOUS | Status: AC
  Filled 2023-03-28: qty 20

## 2023-03-28 MED ORDER — CEFAZOLIN SODIUM-DEXTROSE 2-4 GM/100ML-% IV SOLN
INTRAVENOUS | Status: AC
  Filled 2023-03-28: qty 100

## 2023-03-28 MED ORDER — FENTANYL CITRATE (PF) 100 MCG/2ML IJ SOLN
INTRAMUSCULAR | Status: DC | PRN
  Administered 2023-03-28 (×2): 50 ug via INTRAVENOUS

## 2023-03-28 MED ORDER — ONDANSETRON HCL 4 MG/2ML IJ SOLN
INTRAMUSCULAR | Status: DC | PRN
  Administered 2023-03-28: 4 mg via INTRAVENOUS

## 2023-03-28 MED ORDER — AMISULPRIDE (ANTIEMETIC) 5 MG/2ML IV SOLN: 10.0000 mg | Freq: Once | INTRAVENOUS | Status: DC | PRN

## 2023-03-28 MED ORDER — MIDAZOLAM HCL 2 MG/2ML IJ SOLN
INTRAMUSCULAR | Status: AC
  Filled 2023-03-28: qty 2

## 2023-03-28 MED ORDER — LIDOCAINE HCL (CARDIAC) PF 100 MG/5ML IV SOSY
PREFILLED_SYRINGE | INTRAVENOUS | Status: DC | PRN
  Administered 2023-03-28: 60 mg via INTRAVENOUS

## 2023-03-28 MED ORDER — GLYCOPYRROLATE 0.2 MG/ML IJ SOLN
INTRAMUSCULAR | Status: DC | PRN
  Administered 2023-03-28: .2 mg via INTRAVENOUS

## 2023-03-28 MED ORDER — MIDAZOLAM HCL 5 MG/5ML IJ SOLN
INTRAMUSCULAR | Status: DC | PRN
  Administered 2023-03-28: 2 mg via INTRAVENOUS

## 2023-03-28 MED ORDER — PROPOFOL 10 MG/ML IV BOLUS
INTRAVENOUS | Status: DC | PRN
Start: 2023-03-28 — End: 2023-03-28
  Administered 2023-03-28: 50 mg via INTRAVENOUS
  Administered 2023-03-28: 150 mg via INTRAVENOUS

## 2023-03-28 MED ORDER — EPHEDRINE SULFATE (PRESSORS) 50 MG/ML IJ SOLN
INTRAMUSCULAR | Status: DC | PRN
  Administered 2023-03-28: 10 mg via INTRAVENOUS

## 2023-03-28 MED ORDER — DEXAMETHASONE SODIUM PHOSPHATE 10 MG/ML IJ SOLN
INTRAMUSCULAR | Status: AC
  Filled 2023-03-28: qty 1

## 2023-03-28 MED ORDER — ONDANSETRON HCL 4 MG/2ML IJ SOLN
INTRAMUSCULAR | Status: AC
  Filled 2023-03-28: qty 2

## 2023-03-28 MED ORDER — LACTATED RINGERS IV SOLN: INTRAVENOUS | Status: DC

## 2023-03-28 MED ORDER — CEFAZOLIN SODIUM-DEXTROSE 2-4 GM/100ML-% IV SOLN
2.0000 g | INTRAVENOUS | Status: AC
  Administered 2023-03-28: 2 g via INTRAVENOUS

## 2023-03-28 SURGICAL SUPPLY — 45 items
APL PRP STRL LF DISP 70% ISPRP (MISCELLANEOUS) ×1
BLADE SURG 15 STRL LF DISP TIS (BLADE) ×4 IMPLANT
BLADE SURG 15 STRL SS (BLADE) ×2
BNDG CMPR 5X2 KNTD ELC UNQ LF (GAUZE/BANDAGES/DRESSINGS)
BNDG CMPR 5X3 KNIT ELC UNQ LF (GAUZE/BANDAGES/DRESSINGS) ×1
BNDG CMPR 9X4 STRL LF SNTH (GAUZE/BANDAGES/DRESSINGS) ×1
BNDG ELASTIC 2INX 5YD STR LF (GAUZE/BANDAGES/DRESSINGS) IMPLANT
BNDG ELASTIC 3INX 5YD STR LF (GAUZE/BANDAGES/DRESSINGS) ×2 IMPLANT
BNDG ESMARK 4X9 LF (GAUZE/BANDAGES/DRESSINGS) ×2 IMPLANT
BNDG GAUZE DERMACEA FLUFF 4 (GAUZE/BANDAGES/DRESSINGS) ×2 IMPLANT
BNDG GZE DERMACEA 4 6PLY (GAUZE/BANDAGES/DRESSINGS) ×1
CHLORAPREP W/TINT 26 (MISCELLANEOUS) ×2 IMPLANT
CORD BIPOLAR FORCEPS 12FT (ELECTRODE) IMPLANT
COVER BACK TABLE 60X90IN (DRAPES) ×2 IMPLANT
COVER MAYO STAND STRL (DRAPES) ×2 IMPLANT
CUFF TOURN SGL QUICK 18X4 (TOURNIQUET CUFF) ×2 IMPLANT
DRAPE EXTREMITY T 121X128X90 (DISPOSABLE) ×2 IMPLANT
DRAPE OEC MINIVIEW 54X84 (DRAPES) ×2 IMPLANT
DRAPE SURG 17X23 STRL (DRAPES) ×2 IMPLANT
GAUZE SPONGE 4X4 12PLY STRL (GAUZE/BANDAGES/DRESSINGS) ×2 IMPLANT
GAUZE XEROFORM 1X8 LF (GAUZE/BANDAGES/DRESSINGS) ×2 IMPLANT
GLOVE BIO SURGEON STRL SZ7.5 (GLOVE) ×2 IMPLANT
GLOVE BIOGEL PI IND STRL 8 (GLOVE) ×2 IMPLANT
GLOVE BIOGEL PI IND STRL 8.5 (GLOVE) IMPLANT
GLOVE SURG ORTHO 8.0 STRL STRW (GLOVE) IMPLANT
GOWN STRL REUS W/ TWL LRG LVL3 (GOWN DISPOSABLE) ×2 IMPLANT
GOWN STRL REUS W/TWL LRG LVL3 (GOWN DISPOSABLE) ×1
K-WIRE DBL .035X4 NSTRL (WIRE) ×4
KWIRE DBL .035X4 NSTRL (WIRE) IMPLANT
NDL HYPO 25X1 1.5 SAFETY (NEEDLE) IMPLANT
NEEDLE HYPO 25X1 1.5 SAFETY (NEEDLE) IMPLANT
NS IRRIG 1000ML POUR BTL (IV SOLUTION) ×2 IMPLANT
PACK BASIN DAY SURGERY FS (CUSTOM PROCEDURE TRAY) ×2 IMPLANT
PAD CAST 3X4 CTTN HI CHSV (CAST SUPPLIES) IMPLANT
PAD CAST 4YDX4 CTTN HI CHSV (CAST SUPPLIES) IMPLANT
PADDING CAST COTTON 3X4 STRL (CAST SUPPLIES)
PADDING CAST COTTON 4X4 STRL (CAST SUPPLIES)
SLEEVE SCD COMPRESS KNEE MED (STOCKING) IMPLANT
STOCKINETTE 4X48 STRL (DRAPES) ×2 IMPLANT
SUT ETHILON 3 0 PS 1 (SUTURE) IMPLANT
SUT ETHILON 4 0 PS 2 18 (SUTURE) IMPLANT
SYR BULB EAR ULCER 3OZ GRN STR (SYRINGE) IMPLANT
SYR CONTROL 10ML LL (SYRINGE) IMPLANT
TOWEL GREEN STERILE FF (TOWEL DISPOSABLE) ×4 IMPLANT
UNDERPAD 30X36 HEAVY ABSORB (UNDERPADS AND DIAPERS) ×2 IMPLANT

## 2023-03-28 NOTE — Op Note (Signed)
NAME: Sherry Dennis MEDICAL RECORD NO: [id number] DATE OF BIRTH: [birth year] FACILITY: [facility] LOCATION: [facility] PHYSICIAN: Tami Ribas, MD   OPERATIVE REPORT   DATE OF PROCEDURE: 03/28/23    PREOPERATIVE DIAGNOSIS: 1.  Right small finger proximal phalanx fracture 2.  Right ring finger proximal phalanx fracture 3.  Right small finger metacarpal neck fracture   POSTOPERATIVE DIAGNOSIS:   1.  Right small finger proximal phalanx fracture 2.  Right ring finger proximal phalanx fracture 3.  Right small finger metacarpal neck fracture   PROCEDURE:   1.  Closed reduction pin fixation right small finger proximal phalanx fracture 2.  Closed reduction pin fixation right ring finger proximal phalanx fracture 3.  Closed reduction pin fixation right small finger metacarpal neck fracture   SURGEON:  Betha Loa, M.D.   ASSISTANT: Cindee Salt, MD   ANESTHESIA:  General   INTRAVENOUS FLUIDS:  Per anesthesia flow sheet.   ESTIMATED BLOOD LOSS:  Minimal.   COMPLICATIONS:  None.   SPECIMENS:  none   TOURNIQUET TIME:    Total Tourniquet Time Documented: Upper Arm (Right) - 22 minutes Total: Upper Arm (Right) - 22 minutes    DISPOSITION:  Stable to PACU.   INDICATIONS: 62 year old female states she fell 1 week ago injuring her right hand.  Radiographs show fractures of the ring and small finger proximal phalanges and small finger metacarpal neck.  There is angulation of the small finger proximal phalanx.  She has difficulty with flexion.  She wishes to proceed with operative reduction and fixation.  Risks, benefits and alternatives of surgery were discussed including the risks of blood loss, infection, damage to nerves, vessels, tendons, ligaments, bone for surgery, need for additional surgery, complications with wound healing, continued pain, stiffness, , nonunion, malunion.  She voiced understanding of these risks and elected to proceed.  OPERATIVE COURSE:  After  being identified preoperatively by myself,  the patient and I agreed on the procedure and site of the procedure.  The surgical site was marked.  Surgical consent had been signed. Preoperative IV antibiotic prophylaxis was given. She was transferred to the operating room and placed on the operating table in supine position with the Right upper extremity on an arm board.  General anesthesia was induced by the anesthesiologist.  Right upper extremity was prepped and draped in normal sterile orthopedic fashion.  A surgical pause was performed between the surgeons, anesthesia, and operating room staff and all were in agreement as to the patient, procedure, and site of procedure.  Tourniquet at the proximal aspect of the extremity was inflated to 250 mmHg after exsanguination of the arm with an Esmarch bandage.  C-arm was used in AP lateral oblique projections throughout the case to aid in reduction and position of hardware.  Close reduction of the ring and small finger proximal phalanx fractures was performed.  0.035 inch K wires were used.  These were advanced from the base of the proximal phalanx in a crossed fashion across the fracture site of the small finger proximal phalanx.  This was adequate to stabilize the fracture and good reduction.  2 additional 0.035 inch K wires were advanced from the base of the proximal phalanx of the ring finger in a crossed fashion across the fracture site.  This is adequate stabilize the ring finger proximal phalanx fracture.  2 additional 0.035 inch K wires were advanced from the ulnar side of the small finger metacarpal head into the adjacent ring finger metacarpal.  This was adequate to stabilize the small finger metacarpal fracture.  C-arm was used in AP lateral oblique projections to ensure appropriate reduction position of hardware which was the case.  The wrist was placed through tenodesis and there was no scissoring.  The pins were all bent and cut short.  The pin sites were  injected with quarter percent plain Marcaine to aid in postoperative analgesia.  The pin sites were dressed with sterile Xeroform and 4 x 4's and wrapped with a Kerlix bandage.  Volar and dorsal slab splint including the long ring and small fingers was placed with the MPs flexed and the MPs extended.  This was wrapped with Kerlix and Ace bandage.  The tourniquet was deflated at 22 minutes.  Fingertips were pink with brisk capillary refill after deflation of tourniquet.  The operative  drapes were broken down.  The patient was awoken from anesthesia safely.  She was transferred back to the stretcher and taken to PACU in stable condition.  I will see her back in the office in 1 week for postoperative followup.  I will give her a prescription for Tramadol 50 mg 1-2 tabs PO q6 hours prn pain, dispense # 20.   Betha Loa, MD Electronically signed, 03/28/23

## 2023-03-28 NOTE — Anesthesia Postprocedure Evaluation (Signed)
Anesthesia Post Note  Patient: EVALINE KOZLOSKI  Procedure(s) Performed: CLOSED REDUCTION PERCUTANEOUS PINNING RIGHT SMALL FINGER PROXIMAL PHALANX FRACTURE AND CLOSED REDUCTION PERCUTANEOUS PINNING RIGHT RING FINGER PROXIMAL PHALANX AND RIGHT SMALL METACARPAL HEAD (Right: Finger)     Patient location during evaluation: PACU Anesthesia Type: General Level of consciousness: awake and alert Pain management: pain level controlled Vital Signs Assessment: post-procedure vital signs reviewed and stable Respiratory status: spontaneous breathing, nonlabored ventilation and respiratory function stable Cardiovascular status: stable and blood pressure returned to baseline Anesthetic complications: no   No notable events documented.  Last Vitals:  Vitals:   03/28/23 1515 03/28/23 1530  BP: 139/79 (!) 175/85  Pulse: 75   Resp: 18 18  Temp:  36.7 C  SpO2: 92%     Last Pain:  Vitals:   03/28/23 1530  TempSrc:   PainSc: 4                  Beryle Lathe

## 2023-03-28 NOTE — H&P (Signed)
Sherry Dennis is an 62 y.o. female.   Chief Complaint: hand fractures HPI: 62 yo female states she fell injuring right hand.  Seen in office yesterday.  XR show ring and small proximal phalanx fractures and small metacarpal fracture.  Difficulty with flexion of small finger.  She wishes to proceed with operative reduction and fixation.  Allergies:  Allergies  Allergen Reactions   Darvon [Propoxyphene Hcl] Anaphylaxis and Other (See Comments)    Airway swelling    Propoxyphene Anaphylaxis    Throat closes up   Doxycycline Nausea Only   Hydromorphone Nausea Only   Oseltamivir Hives and Other (See Comments)    Hives, elevated BP   Other     Any narcotic med but morphine makes pt nauseated    Oxycodone-Acetaminophen Nausea Only   Tamiflu [Oseltamivir Phosphate] Hives and Other (See Comments)    Increased blood pressure    Past Medical History:  Diagnosis Date   Allergy    Anemia    Anxiety    Arthritis    "knees, hands, ankles, ~ every joint" (04/08/2015)   Basal cell carcinoma    "face, hands, chest" (04/08/2015)   Chronic bronchitis (HCC)    "get it pretty much q yr" (04/08/2015)   COPD (chronic obstructive pulmonary disease) (HCC)    Dysrhythmia    palpitations occ; SVT 09/2013 s/p adenosine   GERD (gastroesophageal reflux disease)    H/O hiatal hernia    Hx of cardiovascular stress test    Lexiscan Myoview 6/16: Ejection fraction is 60% and wall motion is normal. The study is normal. There is no scar or ischemia. This is a low risk scan.   Hyperlipidemia    Hypertension    Hypothyroidism    Palpitations    PONV (postoperative nausea and vomiting)    S/P total knee arthroplasty 02/15/2014   Sleep apnea    uses CPAP nightly   Thyroid disease    Vitamin D deficiency     Past Surgical History:  Procedure Laterality Date   BASAL CELL CARCINOMA EXCISION  2014   "off my chest"   BLADDER REPAIR  1993   "lasered the holes shut"   CARDIAC CATHETERIZATION  2017   CARDIAC  SURGERY     CARPAL TUNNEL RELEASE Right 1990   COLONOSCOPY     COLONOSCOPY WITH PROPOFOL N/A 11/25/2017   Procedure: COLONOSCOPY WITH PROPOFOL;  Surgeon: Napoleon Form, MD;  Location: [facility];  Service: Endoscopy;  Laterality: N/A;   ELECTROPHYSIOLOGIC STUDY N/A 04/08/2015   Procedure: SVT Ablation;  Surgeon: Marinus Maw, MD;  Location: [facility] INVASIVE CV LAB;  Service: Cardiovascular;  Laterality: N/A;   ENDOMETRIAL ABLATION  2009   INCONTINENCE SURGERY  2010   JOINT REPLACEMENT     KNEE ARTHROSCOPY Right 2013   KNEE ARTHROSCOPY     LAPAROSCOPIC GASTRIC SLEEVE RESECTION N/A 05/26/2019   Procedure: LAPAROSCOPIC GASTRIC SLEEVE RESECTION, Upper Endo, ERAS Pathway;  Surgeon: Gaynelle Adu, MD;  Location: [facility] OR[facility];  Service: General;  Laterality: N/A;   PARTIAL KNEE ARTHROPLASTY Right 02/15/2014   Procedure: RIGHT UNICOMPARTMENTAL KNEE (MEDIAL COMPARTMENT);  Surgeon: Dannielle Huh, MD;  Location: [facility] OR;  Service: Orthopedics;  Laterality: Right;   POLYPECTOMY     REVERSE SHOULDER ARTHROPLASTY Right 04/27/2021   Procedure: REVERSE SHOULDER ARTHROPLASTY;  Surgeon: Francena Hanly, MD;  Location: [facility];  Service: Orthopedics;  Laterality: Right;    SPLENECTOMY, TOTAL  05/26/2019   Procedure: SPLENECTOMY;  Surgeon: Gaynelle Adu, MD;  Location: [facility];  Service: General;;   TUBAL LIGATION  1987    Family History: Family History  Problem Relation Age of Onset   Hypertension Mother    Atrial fibrillation Mother    Diabetes Father    Emphysema Father    COPD Father    Breast cancer Paternal Aunt        x 4 aunts,    Stroke Maternal Grandmother    Heart attack Maternal Grandmother    Hyperlipidemia Maternal Grandfather    Heart disease Maternal Grandfather    Hyperlipidemia Paternal Grandmother    Heart disease Paternal Grandmother    Hyperlipidemia Paternal Grandfather    Heart disease Paternal Grandfather    Colon cancer Neg Hx    Colon polyps Neg Hx    Esophageal cancer Neg Hx     Rectal cancer Neg Hx    Ulcerative colitis Neg Hx    Stomach cancer Neg Hx     Social History:   reports that she has been smoking cigarettes. She has a 45 pack-year smoking history. She has never used smokeless tobacco. She reports current drug use. Drug: Marijuana. She reports that she does not drink alcohol.  Medications: Medications Prior to Admission  Medication Sig Dispense Refill   acetaminophen (TYLENOL) 650 MG CR tablet Take 650 mg by mouth 3 (three) times daily as needed for pain.     atorvastatin (LIPITOR) 10 MG tablet Take 1 tablet (10 mg total) by mouth daily. 90 tablet 2   bisoprolol-hydrochlorothiazide (ZIAC) 10-6.25 MG tablet TAKE 1 TABLET BY MOUTH EVERY MORNING FOR BLOOD PRESSURE 30 tablet 5   Budeson-Glycopyrrol-Formoterol (BREZTRI AEROSPHERE) 160-9-4.8 MCG/ACT AERO Inhale 1 Pump into the lungs in the morning and at bedtime. 10.7 g 1   cyanocobalamin (VITAMIN B12) 1000 MCG/ML injection INJECT ONCE WEEKLY FOR 6 WEEKS, THEN ONCE A MONTH 4 mL 7   cyclobenzaprine (FLEXERIL) 10 MG tablet Take 1 tablet (10 mg total) by mouth 3 (three) times daily as needed for muscle spasms. 30 tablet 1   escitalopram (LEXAPRO) 20 MG tablet TAKE 1 TABLET BY MOUTH EVERY DAY 90 tablet 4   folic acid (FOLVITE) 1 MG tablet TAKE 2 TABLETS (2 MG TOTAL) BY MOUTH AT BEDTIME. 60 tablet 4   levocetirizine (XYZAL) 5 MG tablet Take 5 mg by mouth every evening.     levothyroxine (SYNTHROID) 137 MCG tablet TAKE 1 TABLET DAILY ON AN EMPTY STOMACH WITH ONLY WATER FOR 30 MINUTES & NO ANTACID MEDS, CALCIUM OR MAGNESIUM FOR 4 HOURS & AVOID BIOTIN 30 tablet 11   methocarbamol (ROBAXIN) 500 MG tablet Take 500-1,000 mg by mouth every 6 (six) hours as needed.     naproxen (NAPROSYN) 500 MG tablet Take 1 tablet (500 mg total) by mouth 2 (two) times daily with a meal. 60 tablet 1   olmesartan (BENICAR) 40 MG tablet TAKE 1 TABLET DAILY FOR BLOOD PRESSURE 30 tablet 11   pantoprazole (PROTONIX) 20 MG tablet TAKE  1 TABLET DAILY FOR INDIGESTION & HEARTBURN 30 tablet 11   Semaglutide,0.25 or 0.5MG /DOS, 2 MG/3ML SOPN Inject 0.25 mg into the skin once a week. 3 mL 2   Vitamin D, Ergocalciferol, (DRISDOL) 1.25 MG (50000 UNIT) CAPS capsule TAKE 1 CAPSULE BY MOUTH 3 DAYS A WEEK FOR VITAMIN DEFICIENCY 12 capsule 5   abatacept (ORENCIA) 250 MG injection as directed Intravenous     busPIRone (BUSPAR) 10 MG tablet TAKE 1 TABLET 3 TIMES A DAY FOR ANXIETY **INS 30 DAY**  270 tablet 4   meclizine (ANTIVERT) 25 MG tablet Take 1 tablet (25 mg total) by mouth 3 (three) times daily as needed for dizziness. 30 tablet 0   Menthol, Topical Analgesic, (BIOFREEZE ROLL-ON EX) Apply 1 application topically daily as needed (pain). (Patient not taking: Reported on 03/04/2023)     methotrexate 50 MG/2ML injection Inject 150 mg into the muscle once a week.     ondansetron (ZOFRAN) 4 MG tablet Take 1 tablet (4 mg total) by mouth every 8 (eight) hours as needed for nausea or vomiting. 10 tablet 0   triamcinolone cream (KENALOG) 0.5 % Apply 1 Application topically 2 (two) times daily. 80 g 2    No results found for this or any previous visit (from the past 48 hour(s)).  No results found.    Blood pressure (!) 144/74, pulse 73, temperature 98.3 F (36.8 C), temperature source Oral, resp. rate 16, height 5\' 5"  (1.651 m), weight 109.3 kg, SpO2 97%.  General appearance: alert, cooperative, and appears stated age Head: Normocephalic, without obvious abnormality, atraumatic Neck: supple, symmetrical, trachea midline Extremities: Intact sensation and capillary refill all digits.  +epl/fpl/io.  Laceration in palm with sutures in place Pulses: 2+ and symmetric Skin: Skin color, texture, turgor normal. No rashes or lesions Neurologic: Grossly normal Incision/Wound: as above  Assessment/Plan Right ring and small proximal phalanx fractures and small metacarpal fracture.  Plan closed reduction and pinning of fractures.  Risks, benefits and  alternatives of surgery were discussed including risks of blood loss, infection, damage to nerves/vessels/tendons/ligament/bone, failure of surgery, need for additional surgery, complication with wound healing, stiffness, nonunion, malunion.  She voiced understanding of these risks and elected to proceed.    Betha Loa 03/28/2023, 12:21 PM

## 2023-03-28 NOTE — Transfer of Care (Signed)
Immediate Anesthesia Transfer of Care Note  Patient: KAILIA DEVILLA  Procedure(s) Performed: CLOSED REDUCTION PERCUTANEOUS PINNING RIGHT SMALL FINGER PROXIMAL PHALANX FRACTURE AND CLOSED REDUCTION PERCUTANEOUS PINNING RIGHT RING FINGER PROXIMAL PHALANX AND RIGHT SMALL METACARPAL HEAD (Right: Finger)  Patient Location: PACU  Anesthesia Type:General  Level of Consciousness: sedated  Airway & Oxygen Therapy: Patient Spontanous Breathing and Patient connected to face mask oxygen  Post-op Assessment: Report given to RN and Post -op Vital signs reviewed and stable  Post vital signs: Reviewed and stable  Last Vitals:  Vitals Value Taken Time  BP 148/81 03/28/23 1459  Temp 36.7 C 03/28/23 1459  Pulse 78 03/28/23 1459  Resp 19 03/28/23 1459  SpO2 100 % 03/28/23 1459    Last Pain:  Vitals:   03/28/23 1150  TempSrc: Oral  PainSc: 0-No pain      Patients Stated Pain Goal: 3 (03/28/23 1150)  Complications: No notable events documented.

## 2023-03-28 NOTE — Discharge Instructions (Signed)

## 2023-03-28 NOTE — Anesthesia Preprocedure Evaluation (Addendum)
Anesthesia Evaluation  Patient identified by MRN, date of birth, ID band Patient awake    Reviewed: Allergy & Precautions, NPO status , Patient's Chart, lab work & pertinent test results  History of Anesthesia Complications (+) PONV and history of anesthetic complications  Airway Mallampati: II  TM Distance: >3 FB Neck ROM: Full    Dental  (+) Dental Advisory Given   Pulmonary sleep apnea and Continuous Positive Airway Pressure Ventilation , COPD,  COPD inhaler, Current SmokerPatient did not abstain from smoking.   Pulmonary exam normal        Cardiovascular hypertension, Pt. on medications and Pt. on home beta blockers Normal cardiovascular exam+ dysrhythmias Supra Ventricular Tachycardia      Neuro/Psych  PSYCHIATRIC DISORDERS Anxiety     negative neurological ROS     GI/Hepatic Neg liver ROS, hiatal hernia,GERD  Medicated and Controlled,,  Endo/Other  Hypothyroidism  Morbid obesity  Renal/GU negative Renal ROS     Musculoskeletal  (+) Arthritis ,    Abdominal   Peds  Hematology negative hematology ROS (+)   Anesthesia Other Findings On GLP-1a   Reproductive/Obstetrics                             Anesthesia Physical Anesthesia Plan  ASA: 3  Anesthesia Plan: General   Post-op Pain Management:    Induction: Intravenous  PONV Risk Score and Plan: 3 and Treatment may vary due to age or medical condition, Ondansetron, Dexamethasone and Midazolam  Airway Management Planned: LMA  Additional Equipment: None  Intra-op Plan:   Post-operative Plan: Extubation in OR  Informed Consent: I have reviewed the patients History and Physical, chart, labs and discussed the procedure including the risks, benefits and alternatives for the proposed anesthesia with the patient or authorized representative who has indicated his/her understanding and acceptance.     Dental advisory given  Plan  Discussed with: CRNA and Anesthesiologist  Anesthesia Plan Comments:        Anesthesia Quick Evaluation

## 2023-03-28 NOTE — Anesthesia Procedure Notes (Signed)
Procedure Name: LMA Insertion Date/Time: 03/28/2023 2:12 PM  Performed by: Burna Cash, CRNAPre-anesthesia Checklist: Patient identified, Emergency Drugs available, Suction available and Patient being monitored Patient Re-evaluated:Patient Re-evaluated prior to induction Oxygen Delivery Method: Circle system utilized Preoxygenation: Pre-oxygenation with 100% oxygen Induction Type: IV induction Ventilation: Mask ventilation without difficulty LMA: LMA inserted LMA Size: 4.0 Number of attempts: 1 Airway Equipment and Method: Bite block Placement Confirmation: positive ETCO2 Tube secured with: Tape Dental Injury: Teeth and Oropharynx as per pre-operative assessment

## 2023-03-28 NOTE — Op Note (Signed)
I assisted Surgeons and Role:    * Betha Loa, MD - Primary on the Procedure(s): CLOSED REDUCTION PERCUTANEOUS PINNING RIGHT SMALL FINGER PROXIMAL PHALANX FRACTURE AND CLOSED REDUCTION PERCUTANEOUS PINNING RIGHT RING FINGER PROXIMAL PHALANX AND RIGHT SMALL METACARPAL HEAD on 03/28/2023.  I provided assistance on this case as follows: Support of the arm for reduction and fixation of the proximal phalanx fractures and metacarpal fracture, application of the dressing and splint.  Electronically signed by: Cindee Salt, MD Date: 03/28/2023 Time: 2:50 PM

## 2023-04-01 ENCOUNTER — Encounter (HOSPITAL_BASED_OUTPATIENT_CLINIC_OR_DEPARTMENT_OTHER): Payer: Self-pay | Admitting: Orthopedic Surgery

## 2023-04-29 ENCOUNTER — Encounter: Payer: Self-pay | Admitting: Nurse Practitioner

## 2023-04-29 ENCOUNTER — Other Ambulatory Visit: Payer: Self-pay | Admitting: Nurse Practitioner

## 2023-04-29 ENCOUNTER — Ambulatory Visit (INDEPENDENT_AMBULATORY_CARE_PROVIDER_SITE_OTHER): Payer: 59 | Admitting: Nurse Practitioner

## 2023-04-29 VITALS — BP 102/62 | HR 59 | Temp 97.5°F | Ht 65.0 in | Wt 234.4 lb

## 2023-04-29 DIAGNOSIS — E782 Mixed hyperlipidemia: Secondary | ICD-10-CM

## 2023-04-29 DIAGNOSIS — E119 Type 2 diabetes mellitus without complications: Secondary | ICD-10-CM | POA: Diagnosis not present

## 2023-04-29 DIAGNOSIS — Z79899 Other long term (current) drug therapy: Secondary | ICD-10-CM | POA: Diagnosis not present

## 2023-04-29 MED ORDER — SEMAGLUTIDE(0.25 OR 0.5MG/DOS) 2 MG/3ML ~~LOC~~ SOPN: 0.5000 mg | PEN_INJECTOR | SUBCUTANEOUS | 2 refills | Status: DC

## 2023-04-29 NOTE — Patient Instructions (Signed)

## 2023-04-29 NOTE — Progress Notes (Signed)
Assessment and Plan:  Sherry Dennis was seen today for an episodic visit.  Diagnoses and all order for this visit:  Type 2 diabetes mellitus without complication, without long-term current use of insulin (HCC) Increase Ozempic to 0.5 mg/week as directed. Education: Reviewed 'ABCs' of diabetes management  Discussed goals to be met and/or maintained include A1C (<7) Blood pressure (<130/80) Cholesterol (LDL <70) Continue Eye Exam yearly  Continue Dental Exam Q6 mo Discussed dietary recommendations Discussed Physical Activity recommendations  Mixed hyperlipidemia/aortic atherosclerosis Start Atorvastatin Discussed lifestyle modifications. Recommended diet heavy in fruits and veggies, omega 3's. Decrease consumption of animal meats, cheeses, and dairy products. Remain active and exercise as tolerated. Continue to monitor. Check lipids/TSH  Medication management All medications discussed and reviewed in full. All questions and concerns regarding medications addressed.    Morbid Obesity Trending down Discussed appropriate BMI Diet modification. Physical activity. Encouraged/praised to build confidence.  Meds ordered this encounter  Medications   Semaglutide,0.25 or 0.5MG /DOS, 2 MG/3ML SOPN    Sig: Inject 0.5 mg into the skin once a week.    Dispense:  3 mL    Refill:  2    Order Specific Question:   Supervising Provider    Answer:   Lucky Cowboy [phone]   Notify office for further evaluation and treatment, questions or concerns if any reported s/s fail to improve.   The patient was advised to call back or seek an in-person evaluation if any symptoms worsen or if the condition fails to improve as anticipated.   Further disposition pending results of labs. Discussed med's effects and SE's.    I discussed the assessment and treatment plan with the patient. The patient was provided an opportunity to ask questions and all were answered. The patient agreed with the plan and  demonstrated an understanding of the instructions.  Discussed med's effects and SE's. Screening labs and tests as requested with regular follow-up as recommended.  I provided 25 minutes of face-to-face time during this encounter including counseling, chart review, and critical decision making was preformed.  Today's Plan of Care is based on a patient-centered health care approach known as shared decision making - the decisions, tests and treatments allow for patient preferences and values to be balanced with clinical evidence.    Future Appointments  Date Time Provider Department Center  01/14/2024 10:00 AM Jalissa Heinzelman, Archie Patten, NP [name] None    ------------------------------------------------------------------------------------------------------------------   HPI BP 102/62   Pulse (!) 59   Temp (!) 97.5 F (36.4 C)   Ht 5\' 5"  (1.651 m)   Wt 234 lb 6.4 oz (106.3 kg)   SpO2 97%   BMI 39.01 kg/m   62 y.o.female presents for evaluation of medication management.  During last OV patient blood work revealed elevation in A1c from pre-diabetes to diabetes mellitus.  She started on Ozempic 0.25 mg/week and has been successful with weight and dietary control.  Lab Results  Component Value Date   HGBA1C 6.5 (H) 01/14/2023   She has elevation in LDL and aortic atherosclerosis.  She also started Atorvastatin medication and is currently controlling cholesterol via lifestyle and weight loss. Her cholesterol is at goal. The cholesterol last visit was:   Lab Results  Component Value Date   CHOL 180 01/14/2023   HDL 52 01/14/2023   LDLCALC 100 (H) 01/14/2023   TRIG 184 (H) 01/14/2023   CHOLHDL 3.5 01/14/2023   BMI is Body mass index is 39.01 kg/m., she has been working on diet and exercise.  Wt Readings from Last 3 Encounters:  04/29/23 234 lb 6.4 oz (106.3 kg)  03/28/23 240 lb 15.4 oz (109.3 kg)  03/21/23 240 lb (108.9 kg)    Past Medical History:  Diagnosis Date   Allergy     Anemia    Anxiety    Arthritis    "knees, hands, ankles, ~ every joint" (04/08/2015)   Basal cell carcinoma    "face, hands, chest" (04/08/2015)   Chronic bronchitis (HCC)    "get it pretty much q yr" (04/08/2015)   COPD (chronic obstructive pulmonary disease) (HCC)    Dysrhythmia    palpitations occ; SVT 09/2013 s/p adenosine   GERD (gastroesophageal reflux disease)    H/O hiatal hernia    Hx of cardiovascular stress test    Lexiscan Myoview 6/16: Ejection fraction is 60% and wall motion is normal. The study is normal. There is no scar or ischemia. This is a low risk scan.   Hyperlipidemia    Hypertension    Hypothyroidism    Palpitations    PONV (postoperative nausea and vomiting)    S/P total knee arthroplasty 02/15/2014   Sleep apnea    uses CPAP nightly   Thyroid disease    Vitamin D deficiency      Allergies  Allergen Reactions   Darvon [Propoxyphene Hcl] Anaphylaxis and Other (See Comments)    Airway swelling    Propoxyphene Anaphylaxis    Throat closes up   Doxycycline Nausea Only   Hydromorphone Nausea Only   Oseltamivir Hives and Other (See Comments)    Hives, elevated BP   Other     Any narcotic med but morphine makes pt nauseated    Oxycodone-Acetaminophen Nausea Only   Tamiflu [Oseltamivir Phosphate] Hives and Other (See Comments)    Increased blood pressure    Current Outpatient Medications on File Prior to Visit  Medication Sig   abatacept (ORENCIA) 250 MG injection as directed Intravenous   acetaminophen (TYLENOL) 650 MG CR tablet Take 650 mg by mouth 3 (three) times daily as needed for pain.   atorvastatin (LIPITOR) 10 MG tablet Take 1 tablet (10 mg total) by mouth daily.   Azelastine HCl 137 MCG/SPRAY SOLN 1 (ONE) SPRAY TO EACH NOSTRRIL TWICE A DAY   bisoprolol-hydrochlorothiazide (ZIAC) 10-6.25 MG tablet TAKE 1 TABLET BY MOUTH EVERY MORNING FOR BLOOD PRESSURE   Budeson-Glycopyrrol-Formoterol (BREZTRI AEROSPHERE) 160-9-4.8 MCG/ACT AERO Inhale 1 Pump  into the lungs in the morning and at bedtime.   busPIRone (BUSPAR) 10 MG tablet TAKE 1 TABLET 3 TIMES A DAY FOR ANXIETY **INS 30 DAY**   cephALEXin (KEFLEX) 500 MG capsule Take by mouth.   cyanocobalamin (VITAMIN B12) 1000 MCG/ML injection INJECT ONCE WEEKLY FOR 6 WEEKS, THEN ONCE A MONTH   cyclobenzaprine (FLEXERIL) 10 MG tablet Take 1 tablet (10 mg total) by mouth 3 (three) times daily as needed for muscle spasms.   escitalopram (LEXAPRO) 20 MG tablet TAKE 1 TABLET BY MOUTH EVERY DAY   folic acid (FOLVITE) 1 MG tablet TAKE 2 TABLETS (2 MG TOTAL) BY MOUTH AT BEDTIME.   levocetirizine (XYZAL) 5 MG tablet Take 5 mg by mouth every evening.   levothyroxine (SYNTHROID) 137 MCG tablet TAKE 1 TABLET DAILY ON AN EMPTY STOMACH WITH ONLY WATER FOR 30 MINUTES & NO ANTACID MEDS, CALCIUM OR MAGNESIUM FOR 4 HOURS & AVOID BIOTIN   meclizine (ANTIVERT) 25 MG tablet Take 1 tablet (25 mg total) by mouth 3 (three) times daily as needed for dizziness.  Menthol, Topical Analgesic, (BIOFREEZE ROLL-ON EX) Apply 1 application  topically daily as needed (pain).   methocarbamol (ROBAXIN) 500 MG tablet Take 500-1,000 mg by mouth every 6 (six) hours as needed.   methotrexate 50 MG/2ML injection Inject 15 mg into the muscle once a week.   naproxen (NAPROSYN) 500 MG tablet Take 1 tablet (500 mg total) by mouth 2 (two) times daily with a meal.   ondansetron (ZOFRAN) 4 MG tablet Take 1 tablet (4 mg total) by mouth every 8 (eight) hours as needed for nausea or vomiting.   pantoprazole (PROTONIX) 20 MG tablet TAKE 1 TABLET DAILY FOR INDIGESTION & HEARTBURN   traMADol (ULTRAM) 50 MG tablet 1-2 tabs PO q6 hours prn pain   triamcinolone cream (KENALOG) 0.5 % Apply 1 Application topically 2 (two) times daily.   Vitamin D, Ergocalciferol, (DRISDOL) 1.25 MG (50000 UNIT) CAPS capsule TAKE 1 CAPSULE BY MOUTH 3 DAYS A WEEK FOR VITAMIN DEFICIENCY   No current facility-administered medications on file prior to visit.    ROS:  all negative except what is noted in the HPI.   Physical Exam:  BP 102/62   Pulse (!) 59   Temp (!) 97.5 F (36.4 C)   Ht 5\' 5"  (1.651 m)   Wt 234 lb 6.4 oz (106.3 kg)   SpO2 97%   BMI 39.01 kg/m   General Appearance: NAD.  Awake, conversant and cooperative. Eyes: PERRLA, EOMs intact.  Sclera white.  Conjunctiva without erythema. Sinuses: No frontal/maxillary tenderness.  No nasal discharge. Nares patent.  ENT/Mouth: Ext aud canals clear.  Bilateral TMs w/DOL and without erythema or bulging. Hearing intact.  Posterior pharynx without swelling or exudate.  Tonsils without swelling or erythema.  Neck: Supple.  No masses, nodules or thyromegaly. Respiratory: Effort is regular with non-labored breathing. Breath sounds are equal bilaterally without rales, rhonchi, wheezing or stridor.  Cardio: RRR with no MRGs. Brisk peripheral pulses without edema.  Abdomen: Active BS in all four quadrants.  Soft and non-tender without guarding, rebound tenderness, hernias or masses. Lymphatics: Non tender without lymphadenopathy.  Musculoskeletal: Full ROM, 5/5 strength, normal ambulation.  No clubbing or cyanosis. Skin: Appropriate color for ethnicity. Warm without rashes, lesions, ecchymosis, ulcers.  Neuro: CN II-XII grossly normal. Normal muscle tone without cerebellar symptoms and intact sensation.   Psych: AO X 3,  appropriate mood and affect, insight and judgment.     Adela Glimpse, NP 2:43 PM [facility]

## 2023-05-02 ENCOUNTER — Other Ambulatory Visit: Payer: Self-pay | Admitting: Nurse Practitioner

## 2023-05-02 ENCOUNTER — Telehealth: Payer: Self-pay | Admitting: Nurse Practitioner

## 2023-05-02 MED ORDER — OZEMPIC (1 MG/DOSE) 2 MG/1.5ML ~~LOC~~ SOPN: 1.0000 mg | PEN_INJECTOR | SUBCUTANEOUS | 2 refills | Status: DC

## 2023-05-02 NOTE — Telephone Encounter (Signed)
Pt said yall dicussed her changing the dosage of ozempic. If so, could you send the new script to [facility] on file.

## 2023-05-13 ENCOUNTER — Encounter (HOSPITAL_BASED_OUTPATIENT_CLINIC_OR_DEPARTMENT_OTHER): Payer: Self-pay | Admitting: Orthopedic Surgery

## 2023-05-13 ENCOUNTER — Other Ambulatory Visit: Payer: Self-pay | Admitting: Orthopedic Surgery

## 2023-05-14 ENCOUNTER — Other Ambulatory Visit: Payer: Self-pay

## 2023-05-14 ENCOUNTER — Ambulatory Visit (HOSPITAL_BASED_OUTPATIENT_CLINIC_OR_DEPARTMENT_OTHER): Payer: 59 | Admitting: Anesthesiology

## 2023-05-14 ENCOUNTER — Encounter (HOSPITAL_BASED_OUTPATIENT_CLINIC_OR_DEPARTMENT_OTHER)
Admission: RE | Disposition: A | Discharge status: Home / Self Care | Payer: Self-pay | Source: Home / Self Care | Attending: Orthopedic Surgery

## 2023-05-14 ENCOUNTER — Ambulatory Visit (HOSPITAL_BASED_OUTPATIENT_CLINIC_OR_DEPARTMENT_OTHER): Payer: 59

## 2023-05-14 ENCOUNTER — Ambulatory Visit (HOSPITAL_BASED_OUTPATIENT_CLINIC_OR_DEPARTMENT_OTHER)
Admission: RE | Admit: 2023-05-14 | Discharge: 2023-05-14 | Disposition: A | Discharge status: Home / Self Care | Payer: 59 | Attending: Orthopedic Surgery | Admitting: Orthopedic Surgery

## 2023-05-14 ENCOUNTER — Encounter (HOSPITAL_BASED_OUTPATIENT_CLINIC_OR_DEPARTMENT_OTHER): Payer: Self-pay | Admitting: Orthopedic Surgery

## 2023-05-14 DIAGNOSIS — G4730 Sleep apnea, unspecified: Secondary | ICD-10-CM | POA: Insufficient documentation

## 2023-05-14 DIAGNOSIS — K449 Diaphragmatic hernia without obstruction or gangrene: Secondary | ICD-10-CM | POA: Diagnosis not present

## 2023-05-14 DIAGNOSIS — F419 Anxiety disorder, unspecified: Secondary | ICD-10-CM | POA: Diagnosis not present

## 2023-05-14 DIAGNOSIS — J449 Chronic obstructive pulmonary disease, unspecified: Secondary | ICD-10-CM | POA: Diagnosis not present

## 2023-05-14 DIAGNOSIS — X58XXXA Exposure to other specified factors, initial encounter: Secondary | ICD-10-CM | POA: Diagnosis not present

## 2023-05-14 DIAGNOSIS — K219 Gastro-esophageal reflux disease without esophagitis: Secondary | ICD-10-CM | POA: Diagnosis not present

## 2023-05-14 DIAGNOSIS — Z833 Family history of diabetes mellitus: Secondary | ICD-10-CM | POA: Diagnosis not present

## 2023-05-14 DIAGNOSIS — I10 Essential (primary) hypertension: Secondary | ICD-10-CM | POA: Insufficient documentation

## 2023-05-14 DIAGNOSIS — Z7985 Long-term (current) use of injectable non-insulin antidiabetic drugs: Secondary | ICD-10-CM | POA: Insufficient documentation

## 2023-05-14 DIAGNOSIS — Z6838 Body mass index (BMI) 38.0-38.9, adult: Secondary | ICD-10-CM | POA: Insufficient documentation

## 2023-05-14 DIAGNOSIS — T8489XA Other specified complication of internal orthopedic prosthetic devices, implants and grafts, initial encounter: Secondary | ICD-10-CM | POA: Diagnosis present

## 2023-05-14 DIAGNOSIS — S62610D Displaced fracture of proximal phalanx of right index finger, subsequent encounter for fracture with routine healing: Secondary | ICD-10-CM | POA: Diagnosis not present

## 2023-05-14 DIAGNOSIS — I472 Ventricular tachycardia, unspecified: Secondary | ICD-10-CM | POA: Diagnosis not present

## 2023-05-14 DIAGNOSIS — E785 Hyperlipidemia, unspecified: Secondary | ICD-10-CM | POA: Diagnosis not present

## 2023-05-14 DIAGNOSIS — E119 Type 2 diabetes mellitus without complications: Secondary | ICD-10-CM

## 2023-05-14 DIAGNOSIS — E039 Hypothyroidism, unspecified: Secondary | ICD-10-CM | POA: Insufficient documentation

## 2023-05-14 HISTORY — DX: Type 2 diabetes mellitus without complications: E11.9

## 2023-05-14 HISTORY — PX: HARDWARE REMOVAL: SHX979

## 2023-05-14 LAB — GLUCOSE, CAPILLARY
Glucose-Capillary: 104 mg/dL — ABNORMAL HIGH (ref 70–99)
Glucose-Capillary: 101 mg/dL — ABNORMAL HIGH (ref 70–99)

## 2023-05-14 SURGERY — REMOVAL, HARDWARE
Anesthesia: General | Site: Hand | Laterality: Right

## 2023-05-14 MED ORDER — CEFAZOLIN SODIUM-DEXTROSE 2-4 GM/100ML-% IV SOLN
INTRAVENOUS | Status: AC
  Filled 2023-05-14: qty 100

## 2023-05-14 MED ORDER — ACETAMINOPHEN 160 MG/5ML PO SOLN: 325.0000 mg | ORAL | Status: DC | PRN

## 2023-05-14 MED ORDER — DOXYCYCLINE HYCLATE 50 MG PO CAPS: 100.0000 mg | ORAL_CAPSULE | Freq: Two times a day (BID) | ORAL | 0 refills | Status: DC

## 2023-05-14 MED ORDER — CEFAZOLIN SODIUM-DEXTROSE 2-4 GM/100ML-% IV SOLN
2.0000 g | INTRAVENOUS | Status: AC
  Administered 2023-05-14: 2 g via INTRAVENOUS

## 2023-05-14 MED ORDER — FENTANYL CITRATE (PF) 100 MCG/2ML IJ SOLN
INTRAMUSCULAR | Status: AC
  Filled 2023-05-14: qty 2

## 2023-05-14 MED ORDER — MIDAZOLAM HCL 2 MG/2ML IJ SOLN
INTRAMUSCULAR | Status: AC
  Filled 2023-05-14: qty 2

## 2023-05-14 MED ORDER — FENTANYL CITRATE (PF) 100 MCG/2ML IJ SOLN
INTRAMUSCULAR | Status: DC | PRN
  Administered 2023-05-14: 50 ug via INTRAVENOUS

## 2023-05-14 MED ORDER — ONDANSETRON HCL 4 MG PO TABS: 4.0000 mg | ORAL_TABLET | Freq: Three times a day (TID) | ORAL | 0 refills | Status: DC | PRN

## 2023-05-14 MED ORDER — PROPOFOL 500 MG/50ML IV EMUL
INTRAVENOUS | Status: DC | PRN
Start: 2023-05-14 — End: 2023-05-14
  Administered 2023-05-14: 225 ug/kg/min via INTRAVENOUS

## 2023-05-14 MED ORDER — TRAMADOL HCL 50 MG PO TABS
ORAL_TABLET | ORAL | 0 refills | Status: DC
Start: 2023-05-14 — End: 2024-02-05

## 2023-05-14 MED ORDER — FENTANYL CITRATE (PF) 100 MCG/2ML IJ SOLN: 25.0000 ug | INTRAMUSCULAR | Status: DC | PRN

## 2023-05-14 MED ORDER — ONDANSETRON HCL 4 MG/2ML IJ SOLN: 4.0000 mg | Freq: Once | INTRAMUSCULAR | Status: DC | PRN

## 2023-05-14 MED ORDER — DEXAMETHASONE SODIUM PHOSPHATE 10 MG/ML IJ SOLN
INTRAMUSCULAR | Status: DC | PRN
  Administered 2023-05-14: 5 mg via INTRAVENOUS

## 2023-05-14 MED ORDER — PROPOFOL 10 MG/ML IV BOLUS
INTRAVENOUS | Status: DC | PRN
  Administered 2023-05-14: 200 mg via INTRAVENOUS

## 2023-05-14 MED ORDER — OXYCODONE HCL 5 MG PO TABS: 5.0000 mg | ORAL_TABLET | Freq: Once | ORAL | Status: DC | PRN

## 2023-05-14 MED ORDER — OXYCODONE HCL 5 MG/5ML PO SOLN: 5.0000 mg | Freq: Once | ORAL | Status: DC | PRN

## 2023-05-14 MED ORDER — LACTATED RINGERS IV SOLN: INTRAVENOUS | Status: DC

## 2023-05-14 MED ORDER — ACETAMINOPHEN 325 MG PO TABS: 325.0000 mg | ORAL_TABLET | ORAL | Status: DC | PRN

## 2023-05-14 MED ORDER — LIDOCAINE HCL (CARDIAC) PF 100 MG/5ML IV SOSY
PREFILLED_SYRINGE | INTRAVENOUS | Status: DC | PRN
  Administered 2023-05-14: 40 mg via INTRAVENOUS

## 2023-05-14 MED ORDER — ONDANSETRON HCL 4 MG/2ML IJ SOLN
INTRAMUSCULAR | Status: DC | PRN
  Administered 2023-05-14: 4 mg via INTRAVENOUS

## 2023-05-14 MED ORDER — MEPERIDINE HCL 25 MG/ML IJ SOLN: 6.2500 mg | INTRAMUSCULAR | Status: DC | PRN

## 2023-05-14 SURGICAL SUPPLY — 47 items
APL PRP STRL LF DISP 70% ISPRP (MISCELLANEOUS) ×1
BLADE MINI RND TIP GREEN BEAV (BLADE) IMPLANT
BLADE SURG 15 STRL LF DISP TIS (BLADE) ×4 IMPLANT
BLADE SURG 15 STRL SS (BLADE) ×2
BNDG CMPR 5X2 KNTD ELC UNQ LF (GAUZE/BANDAGES/DRESSINGS)
BNDG CMPR 5X3 KNIT ELC UNQ LF (GAUZE/BANDAGES/DRESSINGS) ×1
BNDG CMPR 9X4 STRL LF SNTH (GAUZE/BANDAGES/DRESSINGS)
BNDG COHESIVE 1X5 TAN STRL LF (GAUZE/BANDAGES/DRESSINGS) IMPLANT
BNDG ELASTIC 2INX 5YD STR LF (GAUZE/BANDAGES/DRESSINGS) IMPLANT
BNDG ELASTIC 3INX 5YD STR LF (GAUZE/BANDAGES/DRESSINGS) ×2 IMPLANT
BNDG ESMARK 4X9 LF (GAUZE/BANDAGES/DRESSINGS) IMPLANT
BNDG GAUZE DERMACEA FLUFF 4 (GAUZE/BANDAGES/DRESSINGS) ×2 IMPLANT
BNDG GZE DERMACEA 4 6PLY (GAUZE/BANDAGES/DRESSINGS) ×1
CHLORAPREP W/TINT 26 (MISCELLANEOUS) ×2 IMPLANT
CORD BIPOLAR FORCEPS 12FT (ELECTRODE) ×2 IMPLANT
COVER BACK TABLE 60X90IN (DRAPES) ×2 IMPLANT
COVER MAYO STAND STRL (DRAPES) ×2 IMPLANT
CUFF TOURN SGL QUICK 18X4 (TOURNIQUET CUFF) ×2 IMPLANT
DRAPE EXTREMITY T 121X128X90 (DISPOSABLE) ×2 IMPLANT
DRAPE SURG 17X23 STRL (DRAPES) ×2 IMPLANT
GAUZE PACKING IODOFORM 1/4X15 (PACKING) IMPLANT
GAUZE PAD ABD 8X10 STRL (GAUZE/BANDAGES/DRESSINGS) IMPLANT
GAUZE SPONGE 4X4 12PLY STRL (GAUZE/BANDAGES/DRESSINGS) ×2 IMPLANT
GAUZE XEROFORM 1X8 LF (GAUZE/BANDAGES/DRESSINGS) ×2 IMPLANT
GLOVE BIO SURGEON STRL SZ7.5 (GLOVE) ×2 IMPLANT
GLOVE BIOGEL PI IND STRL 8 (GLOVE) ×2 IMPLANT
GOWN STRL REUS W/ TWL LRG LVL3 (GOWN DISPOSABLE) ×2 IMPLANT
GOWN STRL REUS W/TWL LRG LVL3 (GOWN DISPOSABLE) ×1
GOWN STRL REUS W/TWL XL LVL3 (GOWN DISPOSABLE) ×2 IMPLANT
NDL HYPO 25X1 1.5 SAFETY (NEEDLE) IMPLANT
NEEDLE HYPO 25X1 1.5 SAFETY (NEEDLE) ×1 IMPLANT
NS IRRIG 1000ML POUR BTL (IV SOLUTION) ×2 IMPLANT
PACK BASIN DAY SURGERY FS (CUSTOM PROCEDURE TRAY) ×2 IMPLANT
PAD CAST 3X4 CTTN HI CHSV (CAST SUPPLIES) IMPLANT
PADDING CAST ABS COTTON 4X4 ST (CAST SUPPLIES) ×2 IMPLANT
PADDING CAST COTTON 3X4 STRL (CAST SUPPLIES) ×1
SPIKE FLUID TRANSFER (MISCELLANEOUS) ×2 IMPLANT
SPLINT PLASTER CAST XFAST 3X15 (CAST SUPPLIES) IMPLANT
STOCKINETTE 4X48 STRL (DRAPES) ×2 IMPLANT
STRIP CLOSURE SKIN 1/4X4 (GAUZE/BANDAGES/DRESSINGS) IMPLANT
SUT ETHILON 4 0 PS 2 18 (SUTURE) ×2 IMPLANT
SUT MNCRL AB 4-0 PS2 18 (SUTURE) IMPLANT
SUT VIC AB 3-0 FS2 27 (SUTURE) IMPLANT
SYR BULB EAR ULCER 3OZ GRN STR (SYRINGE) ×2 IMPLANT
SYR CONTROL 10ML LL (SYRINGE) IMPLANT
TOWEL GREEN STERILE FF (TOWEL DISPOSABLE) ×4 IMPLANT
UNDERPAD 30X36 HEAVY ABSORB (UNDERPADS AND DIAPERS) ×2 IMPLANT

## 2023-05-14 NOTE — Op Note (Signed)
NAME: COURTNEI GIORNO MEDICAL RECORD NO: [id number] DATE OF BIRTH: Oct 22, 1960 FACILITY: [facility] LOCATION: [facility] PHYSICIAN: Tami Ribas, MD   OPERATIVE REPORT   DATE OF PROCEDURE: 05/14/23    PREOPERATIVE DIAGNOSIS: Retained hardware right hand   POSTOPERATIVE DIAGNOSIS: 1.  Retained hardware right hand 2.  Pin tract infection right hand   PROCEDURE: Removal of retained buried pins x 4 from 3 separate incisions   SURGEON:  Betha Loa, M.D.   ASSISTANT: Cindee Salt, MD   ANESTHESIA:  General   INTRAVENOUS FLUIDS:  Per anesthesia flow sheet.   ESTIMATED BLOOD LOSS:  Minimal.   COMPLICATIONS:  None.   SPECIMENS: Cultures to micro   TOURNIQUET TIME:   Right arm: Approximately 20 minutes at 250 mmHg   DISPOSITION:  Stable to PACU.   INDICATIONS: 62 year old female status post pin fixation right ring and small finger proximal phalanx fractures and small finger metacarpal neck fracture.  Her pins have gone under the skin.  She has noted purulence coming out from one of the wounds.  She presents today for removal of the pins and incision and drainage as necessary.  Risks, benefits and alternatives of surgery were discussed including the risks of blood loss, infection, damage to nerves, vessels, tendons, ligaments, bone for surgery, need for additional surgery, complications with wound healing, continued pain, stiffness, , need for repeat irrigation and debridement.  She voiced understanding of these risks and elected to proceed.  OPERATIVE COURSE:  After being identified preoperatively by myself,  the patient and I agreed on the procedure and site of the procedure.  The surgical site was marked.  Surgical consent had been signed. Preoperative IV antibiotic prophylaxis was given. She was transferred to the operating room and placed on the operating table in supine position with the Right upper extremity on an arm board.  General anesthesia was induced by the  anesthesiologist.  Right upper extremity was prepped and draped in normal sterile orthopedic fashion.  A surgical pause was performed between the surgeons, anesthesia, and operating room staff and all were in agreement as to the patient, procedure, and site of procedure.  Tourniquet at the proximal aspect of the extremity was inflated to 250 mmHg after exsanguination of the arm with an Esmarch bandage.  Incision was made at the ulnar side of the hand and the location of the pin tract.  This was carried in subcutaneous tissues by spreading technique.  The C-arm was used in AP and lateral projections to aid in localizing the pin.  The pin was able to be grasped and removed.  It had gone into the metacarpal head of the small finger.  There is a small amount of purulence.  Cultures were taken for aerobes and anaerobes.  There is a wound on the dorsum of the hand between the ring and small finger metacarpals.  This was opened and the devitalized skin edges sharply removed with the scissors.  There was purulence within this wound.  Cultures were taken for aerobes and anaerobes.  2 pins were removed from this wound.  Wound came from the small finger wound came from the ring finger.  The final pin was between the long and ring finger metacarpals.  Incision was made over top of this pin.  This was carried in subcutaneous tissues by spreading technique.  The pin was identified and removed.  There is no purulence in this area.  The wounds were all copiously irrigated with sterile saline.  The pin tract in the small finger metacarpal head was scraped with the curette.  The wounds were all then packed with quarter inch iodoform gauze.  They were injected with quarter percent plain Marcaine to aid in postoperative analgesia.  Wounds were dressed with sterile 4 x 4's and wrapped with a Kerlix bandage.  You were splint including the long ring and small fingers was placed and wrapped with Kerlix and Ace bandage.  The tourniquet was  deflated at approximately 20 minutes.  Fingertips were pink with brisk capillary refill after deflation of tourniquet.  The operative  drapes were broken down.  The patient was awoken from anesthesia safely.  She was transferred back to the stretcher and taken to PACU in stable condition.  I will see her back in the office in 3-4 days for postoperative followup.  I will give her a prescription for Tramadol 50 mg 1-2 tabs PO q6 hours prn pain, dispense # 20 and Bactrim DS 1 p.o. twice daily x 7 days.   Betha Loa, MD Electronically signed, 05/14/23

## 2023-05-14 NOTE — Transfer of Care (Signed)
Immediate Anesthesia Transfer of Care Note  Patient: Sherry Dennis  Procedure(s) Performed: REMOVAL RETAINED K-WIRES RIGHT HAND (Right: Hand)  Patient Location: PACU  Anesthesia Type:General  Level of Consciousness: awake, alert , and oriented  Airway & Oxygen Therapy: Patient Spontanous Breathing and Patient connected to face mask oxygen  Post-op Assessment: Report given to RN and Post -op Vital signs reviewed and stable  Post vital signs: Reviewed and stable  Last Vitals:  Vitals Value Taken Time  BP 114/85 05/14/23 1716  Temp    Pulse 57 05/14/23 1718  Resp 21 05/14/23 1718  SpO2 100 % 05/14/23 1718  Vitals shown include unfiled device data.  Last Pain:  Vitals:   05/14/23 1321  TempSrc: Oral  PainSc: 3          Complications: No notable events documented.

## 2023-05-14 NOTE — Anesthesia Postprocedure Evaluation (Signed)
Anesthesia Post Note  Patient: Sherry Dennis  Procedure(s) Performed: REMOVAL RETAINED K-WIRES RIGHT HAND (Right: Hand)     Patient location during evaluation: PACU Anesthesia Type: General Level of consciousness: awake and alert Pain management: pain level controlled Vital Signs Assessment: post-procedure vital signs reviewed and stable Respiratory status: spontaneous breathing, nonlabored ventilation, respiratory function stable and patient connected to nasal cannula oxygen Cardiovascular status: blood pressure returned to baseline and stable Postop Assessment: no apparent nausea or vomiting Anesthetic complications: no  No notable events documented.  Last Vitals:  Vitals:   05/14/23 1726 05/14/23 1730  BP:  139/82  Pulse:  (!) 54  Resp:  20  Temp:    SpO2: 96% 95%    Last Pain:  Vitals:   05/14/23 1730  TempSrc:   PainSc: 0-No pain                 Kenwood Rosiak S

## 2023-05-14 NOTE — Discharge Instructions (Addendum)

## 2023-05-14 NOTE — Op Note (Signed)
I assisted Surgeons and Role:    * Betha Loa, MD - Primary    Cindee Salt, MD - Assisting on the Procedure(s): REMOVAL RETAINED K-WIRES RIGHT HAND on 05/14/2023.  I provided assistance on this case as follows: Set up, approach, retraction, removal of the internal hardware, packing of the wounds and application dressing and splint.  Electronically signed by: Cindee Salt, MD Date: 05/14/2023 Time: 5:11 PM

## 2023-05-14 NOTE — Anesthesia Preprocedure Evaluation (Addendum)
Anesthesia Evaluation  Patient identified by MRN, date of birth, ID band Patient awake    Reviewed: Allergy & Precautions, NPO status , Patient's Chart, lab work & pertinent test results  History of Anesthesia Complications (+) PONV and history of anesthetic complications  Airway Mallampati: II  TM Distance: >3 FB Neck ROM: Full    Dental  (+) Dental Advisory Given   Pulmonary sleep apnea and Continuous Positive Airway Pressure Ventilation , COPD,  COPD inhaler, Current SmokerPatient did not abstain from smoking.   Pulmonary exam normal        Cardiovascular hypertension, Pt. on medications and Pt. on home beta blockers Normal cardiovascular exam+ dysrhythmias Supra Ventricular Tachycardia      Neuro/Psych  PSYCHIATRIC DISORDERS Anxiety     negative neurological ROS     GI/Hepatic Neg liver ROS, hiatal hernia,GERD  Medicated and Controlled,,  Endo/Other  diabetesHypothyroidism  Morbid obesity  Renal/GU negative Renal ROS     Musculoskeletal  (+) Arthritis ,    Abdominal   Peds  Hematology negative hematology ROS (+)   Anesthesia Other Findings On GLP-1a   Reproductive/Obstetrics                             Anesthesia Physical Anesthesia Plan  ASA: 3  Anesthesia Plan: General   Post-op Pain Management:    Induction: Intravenous  PONV Risk Score and Plan: 3 and Treatment may vary due to age or medical condition, Ondansetron, Dexamethasone and TIVA  Airway Management Planned: LMA  Additional Equipment: None  Intra-op Plan:   Post-operative Plan: Extubation in OR  Informed Consent: I have reviewed the patients History and Physical, chart, labs and discussed the procedure including the risks, benefits and alternatives for the proposed anesthesia with the patient or authorized representative who has indicated his/her understanding and acceptance.     Dental advisory  given  Plan Discussed with: CRNA and Anesthesiologist  Anesthesia Plan Comments:        Anesthesia Quick Evaluation

## 2023-05-14 NOTE — H&P (Signed)
Sherry Dennis is an 62 y.o. female.   Chief Complaint: retained hardware HPI: 62 yo female s/p reduction and pinning ring and small finger proximal phalanx fractures and metacarpal fracture.  Pins have gone under the skin.  She presents for removal of hardware in OR.  Allergies:  Allergies  Allergen Reactions   Darvon [Propoxyphene Hcl] Anaphylaxis and Other (See Comments)    Airway swelling    Propoxyphene Anaphylaxis    Throat closes up   Doxycycline Nausea Only   Hydromorphone Nausea Only   Oseltamivir Hives and Other (See Comments)    Hives, elevated BP   Other     Any narcotic med but morphine makes pt nauseated    Oxycodone-Acetaminophen Nausea Only   Tamiflu [Oseltamivir Phosphate] Hives and Other (See Comments)    Increased blood pressure    Past Medical History:  Diagnosis Date   Allergy    Anemia    Anxiety    Arthritis    "knees, hands, ankles, ~ every joint" (04/08/2015)   Basal cell carcinoma    "face, hands, chest" (04/08/2015)   Chronic bronchitis (HCC)    "get it pretty much q yr" (04/08/2015)   COPD (chronic obstructive pulmonary disease) (HCC)    Diabetes mellitus without complication (HCC)    Dysrhythmia    palpitations occ; SVT 09/2013 s/p adenosine   GERD (gastroesophageal reflux disease)    H/O hiatal hernia    Hx of cardiovascular stress test    Lexiscan Myoview 6/16: Ejection fraction is 60% and wall motion is normal. The study is normal. There is no scar or ischemia. This is a low risk scan.   Hyperlipidemia    Hypertension    Hypothyroidism    Palpitations    PONV (postoperative nausea and vomiting)    S/P total knee arthroplasty 02/15/2014   Sleep apnea    uses CPAP nightly   Thyroid disease    Vitamin D deficiency     Past Surgical History:  Procedure Laterality Date   BASAL CELL CARCINOMA EXCISION  2014   "off my chest"   BLADDER REPAIR  1993   "lasered the holes shut"   CARDIAC CATHETERIZATION  2017   CARDIAC SURGERY     CARPAL  TUNNEL RELEASE Right 1990   CLOSED REDUCTION FINGER WITH PERCUTANEOUS PINNING Right 03/28/2023   Procedure: CLOSED REDUCTION PERCUTANEOUS PINNING RIGHT SMALL FINGER PROXIMAL PHALANX FRACTURE AND CLOSED REDUCTION PERCUTANEOUS PINNING RIGHT RING FINGER PROXIMAL PHALANX AND RIGHT SMALL METACARPAL HEAD;  Surgeon: Betha Loa, MD;  Location: [facility];  Service: Orthopedics;  Laterality: Right;   COLONOSCOPY     COLONOSCOPY WITH PROPOFOL N/A 11/25/2017   Procedure: COLONOSCOPY WITH PROPOFOL;  Surgeon: Napoleon Form, MD;  Location: [facility];  Service: Endoscopy;  Laterality: N/A;   ELECTROPHYSIOLOGIC STUDY N/A 04/08/2015   Procedure: SVT Ablation;  Surgeon: Marinus Maw, MD;  Location: [facility];  Service: Cardiovascular;  Laterality: N/A;   ENDOMETRIAL ABLATION  2009   INCONTINENCE SURGERY  2010   JOINT REPLACEMENT     KNEE ARTHROSCOPY Right 2013   KNEE ARTHROSCOPY     LAPAROSCOPIC GASTRIC SLEEVE RESECTION N/A 05/26/2019   Procedure: LAPAROSCOPIC GASTRIC SLEEVE RESECTION, Upper Endo, ERAS Pathway;  Surgeon: Gaynelle Adu, MD;  Location: [facility];  Service: General;  Laterality: N/A;   PARTIAL KNEE ARTHROPLASTY Right 02/15/2014   Procedure: RIGHT UNICOMPARTMENTAL KNEE (MEDIAL COMPARTMENT);  Surgeon: Dannielle Huh, MD;  Location: [facility] OR;  Service: Orthopedics;  Laterality: Right;   POLYPECTOMY     REVERSE SHOULDER ARTHROPLASTY Right 04/27/2021   Procedure: REVERSE SHOULDER ARTHROPLASTY;  Surgeon: Francena Hanly, MD;  Location: [facility];  Service: Orthopedics;  Laterality: Right;    SPLENECTOMY, TOTAL  05/26/2019   Procedure: SPLENECTOMY;  Surgeon: Gaynelle Adu, MD;  Location: [facility];  Service: General;;   TUBAL LIGATION  1987    Family History: Family History  Problem Relation Age of Onset   Hypertension Mother    Atrial fibrillation Mother    Diabetes Father    Emphysema Father    COPD Father    Breast cancer Paternal Aunt        x 4 aunts,    Stroke  Maternal Grandmother    Heart attack Maternal Grandmother    Hyperlipidemia Maternal Grandfather    Heart disease Maternal Grandfather    Hyperlipidemia Paternal Grandmother    Heart disease Paternal Grandmother    Hyperlipidemia Paternal Grandfather    Heart disease Paternal Grandfather    Colon cancer Neg Hx    Colon polyps Neg Hx    Esophageal cancer Neg Hx    Rectal cancer Neg Hx    Ulcerative colitis Neg Hx    Stomach cancer Neg Hx     Social History:   reports that she has been smoking cigarettes. She has a 45 pack-year smoking history. She has never used smokeless tobacco. She reports current drug use. Drug: Marijuana. She reports that she does not drink alcohol.  Medications: Medications Prior to Admission  Medication Sig Dispense Refill   acetaminophen (TYLENOL) 650 MG CR tablet Take 650 mg by mouth 3 (three) times daily as needed for pain.     atorvastatin (LIPITOR) 10 MG tablet Take 1 tablet (10 mg total) by mouth daily. 90 tablet 2   bisoprolol-hydrochlorothiazide (ZIAC) 10-6.25 MG tablet TAKE 1 TABLET BY MOUTH EVERY MORNING FOR BLOOD PRESSURE 30 tablet 5   Budeson-Glycopyrrol-Formoterol (BREZTRI AEROSPHERE) 160-9-4.8 MCG/ACT AERO Inhale 1 Pump into the lungs in the morning and at bedtime. 10.7 g 1   busPIRone (BUSPAR) 10 MG tablet TAKE 1 TABLET 3 TIMES A DAY FOR ANXIETY **INS 30 DAY** 270 tablet 4   escitalopram (LEXAPRO) 20 MG tablet TAKE 1 TABLET BY MOUTH EVERY DAY 90 tablet 4   levothyroxine (SYNTHROID) 137 MCG tablet TAKE 1 TABLET DAILY ON AN EMPTY STOMACH WITH ONLY WATER FOR 30 MINUTES & NO ANTACID MEDS, CALCIUM OR MAGNESIUM FOR 4 HOURS & AVOID BIOTIN 30 tablet 11   naproxen (NAPROSYN) 500 MG tablet Take 1 tablet (500 mg total) by mouth 2 (two) times daily with a meal. 60 tablet 1   olmesartan (BENICAR) 40 MG tablet TAKE 1 TABLET BY MOUTH EVERY DAY FOR BLOOD PRESSURE 30 tablet 11   pantoprazole (PROTONIX) 20 MG tablet TAKE 1 TABLET DAILY FOR INDIGESTION & HEARTBURN 30  tablet 11   abatacept (ORENCIA) 250 MG injection as directed Intravenous     Azelastine HCl 137 MCG/SPRAY SOLN 1 (ONE) SPRAY TO EACH NOSTRRIL TWICE A DAY     cyanocobalamin (VITAMIN B12) 1000 MCG/ML injection INJECT ONCE WEEKLY FOR 6 WEEKS, THEN ONCE A MONTH 4 mL 7   cyclobenzaprine (FLEXERIL) 10 MG tablet Take 1 tablet (10 mg total) by mouth 3 (three) times daily as needed for muscle spasms. 30 tablet 1   folic acid (FOLVITE) 1 MG tablet TAKE 2 TABLETS (2 MG TOTAL) BY MOUTH AT BEDTIME. 60 tablet 4   levocetirizine (XYZAL) 5 MG  tablet Take 5 mg by mouth every evening.     meclizine (ANTIVERT) 25 MG tablet Take 1 tablet (25 mg total) by mouth 3 (three) times daily as needed for dizziness. 30 tablet 0   Menthol, Topical Analgesic, (BIOFREEZE ROLL-ON EX) Apply 1 application  topically daily as needed (pain).     methocarbamol (ROBAXIN) 500 MG tablet Take 500-1,000 mg by mouth every 6 (six) hours as needed.     methotrexate 50 MG/2ML injection Inject 15 mg into the muscle once a week.     ondansetron (ZOFRAN) 4 MG tablet Take 1 tablet (4 mg total) by mouth every 8 (eight) hours as needed for nausea or vomiting. 10 tablet 0   Semaglutide, 1 MG/DOSE, (OZEMPIC, 1 MG/DOSE,) 2 MG/1.5ML SOPN Inject 1 mg into the skin once a week. 2 mL 2   traMADol (ULTRAM) 50 MG tablet 1-2 tabs PO q6 hours prn pain 20 tablet 0   triamcinolone cream (KENALOG) 0.5 % Apply 1 Application topically 2 (two) times daily. 80 g 2   Vitamin D, Ergocalciferol, (DRISDOL) 1.25 MG (50000 UNIT) CAPS capsule TAKE 1 CAPSULE BY MOUTH 3 DAYS A WEEK FOR VITAMIN DEFICIENCY 12 capsule 5    No results found for this or any previous visit (from the past 48 hour(s)).  No results found.    Height 5\' 5"  (1.651 m), weight 103 kg.  General appearance: alert, cooperative, and appears stated age Head: Normocephalic, without obvious abnormality, atraumatic Neck: supple, symmetrical, trachea midline Extremities: Intact sensation and  capillary refill all digits.  +epl/fpl/io.   Pulses: 2+ and symmetric Skin: Skin color, texture, turgor normal. No rashes or lesions Neurologic: Grossly normal Incision/Wound: none  Assessment/Plan Retained hardware. Right hand.  Plan removal in OR.  Risks, benefits and alternatives of surgery were discussed including risks of blood loss, infection, damage to nerves/vessels/tendons/ligament/bone, failure of surgery, need for additional surgery, complication with wound healing, stiffness.  She voiced understanding of these risks and elected to proceed.    Betha Loa 05/14/2023, 1:08 PM

## 2023-05-14 NOTE — Anesthesia Procedure Notes (Signed)
Procedure Name: LMA Insertion Date/Time: 05/14/2023 4:34 PM  Performed by: Ronnette Hila, CRNAPre-anesthesia Checklist: Patient identified, Emergency Drugs available, Suction available and Patient being monitored Patient Re-evaluated:Patient Re-evaluated prior to induction Oxygen Delivery Method: Circle System Utilized Preoxygenation: Pre-oxygenation with 100% oxygen Induction Type: IV induction Ventilation: Mask ventilation without difficulty LMA: LMA inserted LMA Size: 4.0 Number of attempts: 1 Airway Equipment and Method: bite block Placement Confirmation: positive ETCO2 Tube secured with: Tape Dental Injury: Teeth and Oropharynx as per pre-operative assessment

## 2023-05-15 ENCOUNTER — Encounter (HOSPITAL_BASED_OUTPATIENT_CLINIC_OR_DEPARTMENT_OTHER): Payer: Self-pay | Admitting: Orthopedic Surgery

## 2023-05-18 LAB — AEROBIC/ANAEROBIC CULTURE W GRAM STAIN (SURGICAL/DEEP WOUND)

## 2023-05-19 LAB — AEROBIC/ANAEROBIC CULTURE W GRAM STAIN (SURGICAL/DEEP WOUND)

## 2023-05-28 ENCOUNTER — Other Ambulatory Visit: Payer: Self-pay | Admitting: Nurse Practitioner

## 2023-05-28 DIAGNOSIS — E039 Hypothyroidism, unspecified: Secondary | ICD-10-CM

## 2023-06-26 ENCOUNTER — Encounter: Payer: Self-pay | Admitting: Internal Medicine

## 2023-06-30 ENCOUNTER — Other Ambulatory Visit: Payer: Self-pay | Admitting: Nurse Practitioner

## 2023-07-01 ENCOUNTER — Ambulatory Visit (INDEPENDENT_AMBULATORY_CARE_PROVIDER_SITE_OTHER): Payer: 59 | Admitting: Nurse Practitioner

## 2023-07-01 ENCOUNTER — Encounter: Payer: Self-pay | Admitting: Nurse Practitioner

## 2023-07-01 VITALS — BP 118/80 | HR 67 | Temp 98.0°F | Ht 65.0 in | Wt 222.0 lb

## 2023-07-01 DIAGNOSIS — Z79899 Other long term (current) drug therapy: Secondary | ICD-10-CM

## 2023-07-01 DIAGNOSIS — E119 Type 2 diabetes mellitus without complications: Secondary | ICD-10-CM

## 2023-07-01 DIAGNOSIS — I700 Atherosclerosis of aorta: Secondary | ICD-10-CM | POA: Diagnosis not present

## 2023-07-01 DIAGNOSIS — E782 Mixed hyperlipidemia: Secondary | ICD-10-CM

## 2023-07-01 DIAGNOSIS — B3731 Acute candidiasis of vulva and vagina: Secondary | ICD-10-CM

## 2023-07-01 MED ORDER — FLUCONAZOLE 150 MG PO TABS: ORAL_TABLET | ORAL | 0 refills | Status: DC

## 2023-07-01 MED ORDER — ONDANSETRON HCL 4 MG PO TABS: 4.0000 mg | ORAL_TABLET | Freq: Three times a day (TID) | ORAL | 0 refills | Status: AC | PRN

## 2023-07-01 MED ORDER — SEMAGLUTIDE (1 MG/DOSE) 4 MG/3ML ~~LOC~~ SOPN: 1.0000 mg | PEN_INJECTOR | SUBCUTANEOUS | 2 refills | Status: DC

## 2023-07-01 NOTE — Patient Instructions (Signed)
 Semaglutide Injection What is this medication? SEMAGLUTIDE (SEM a GLOO tide) treats type 2 diabetes. It works by increasing insulin levels in your body, which decreases your blood sugar (glucose).   It also reduces the amount of sugar released into your blood and slows down your digestion. It may also be used to lower the risk of heart attack and stroke in people with type 2 diabetes. Changes to diet and exercise are often combined with this medication. This medicine may be used for other purposes; ask your health care provider or pharmacist if you have questions. COMMON BRAND NAME(S): OZEMPIC What should I tell my care team before I take this medication? They need to know if you have any of these conditions: Endocrine tumors (MEN 2) or if someone in your family had these tumors Eye disease, vision problems History of pancreatitis Kidney disease Stomach problems Thyroid cancer or if someone in your family had thyroid cancer An unusual or allergic reaction to semaglutide, other medications, foods, dyes, or preservatives Pregnant or trying to get pregnant Breast-feeding How should I use this medication? This medication is for injection under the skin of your upper leg (thigh), stomach area, or upper arm. It is given once every week (every 7 days). You will be taught how to prepare and give this medication. Use exactly as directed. Take your medication at regular intervals. Do not take it more often than directed. If you use this medication with insulin, you should inject this medication and the insulin separately. Do not mix them together. Do not give the injections right next to each other. Change (rotate) injection sites with each injection. It is important that you put your used needles and syringes in a special sharps container. Do not put them in a trash can. If you do not have a sharps container, call your pharmacist or care team to get one. A special MedGuide will be given to you by the  pharmacist with each prescription and refill. Be sure to read this information carefully each time. This medication comes with INSTRUCTIONS FOR USE. Ask your pharmacist for directions on how to use this medication. Read the information carefully. Talk to your pharmacist or care team if you have questions. Talk to your care team about the use of this medication in children. Special care may be needed. Overdosage: If you think you have taken too much of this medicine contact a poison control center or emergency room at once. NOTE: This medicine is only for you. Do not share this medicine with others. What if I miss a dose? If you miss a dose, take it as soon as you can within 5 days after the missed dose. Then take your next dose at your regular weekly time. If it has been longer than 5 days after the missed dose, do not take the missed dose. Take the next dose at your regular time. Do not take double or extra doses. If you have questions about a missed dose, contact your care team for advice. What may interact with this medication? Other medications for diabetes Many medications may cause changes in blood sugar, these include: Alcohol containing beverages Antiviral medications for HIV or AIDS Aspirin and aspirin-like medications Certain medications for blood pressure, heart disease, irregular heart beat Chromium Diuretics Female hormones, such as estrogens or progestins, birth control pills Fenofibrate Gemfibrozil Isoniazid Lanreotide Female hormones or anabolic steroids MAOIs like Carbex, Eldepryl, Marplan, Nardil, and Parnate Medications for weight loss Medications for allergies, asthma, cold, or cough Medications  for depression, anxiety, or psychotic disturbances Niacin Nicotine NSAIDs, medications for pain and inflammation, like ibuprofen or naproxen Octreotide Pasireotide Pentamidine Phenytoin Probenecid Quinolone antibiotics such as ciprofloxacin, levofloxacin, ofloxacin Some  herbal dietary supplements Steroid medications such as prednisone or cortisone Sulfamethoxazole; trimethoprim Thyroid hormones Some medications can hide the warning symptoms of low blood sugar (hypoglycemia). You may need to monitor your blood sugar more closely if you are taking one of these medications. These include: Beta-blockers, often used for high blood pressure or heart problems (examples include atenolol, metoprolol, propranolol) Clonidine Guanethidine Reserpine This list may not describe all possible interactions. Give your health care provider a list of all the medicines, herbs, non-prescription drugs, or dietary supplements you use. Also tell them if you smoke, drink alcohol, or use illegal drugs. Some items may interact with your medicine. What should I watch for while using this medication? Visit your care team for regular checks on your progress. Drink plenty of fluids while taking this medication. Check with your care team if you get an attack of severe diarrhea, nausea, and vomiting. The loss of too much body fluid can make it dangerous for you to take this medication. A test called the HbA1C (A1C) will be monitored. This is a simple blood test. It measures your blood sugar control over the last 2 to 3 months. You will receive this test every 3 to 6 months. Learn how to check your blood sugar. Learn the symptoms of low and high blood sugar and how to manage them. Always carry a quick-source of sugar with you in case you have symptoms of low blood sugar. Examples include hard sugar candy or glucose tablets. Make sure others know that you can choke if you eat or drink when you develop serious symptoms of low blood sugar, such as seizures or unconsciousness. They must get medical help at once. Tell your care team if you have high blood sugar. You might need to change the dose of your medication. If you are sick or exercising more than usual, you might need to change the dose of your  medication. Do not skip meals. Ask your care team if you should avoid alcohol. Many nonprescription cough and cold products contain sugar or alcohol. These can affect blood sugar. Pens should never be shared. Even if the needle is changed, sharing may result in passing of viruses like hepatitis or HIV. Wear a medical ID bracelet or chain, and carry a card that describes your disease and details of your medication and dosage times. What side effects may I notice from receiving this medication? Side effects that you should report to your care team as soon as possible: Allergic reactions--skin rash, itching, hives, swelling of the face, lips, tongue, or throat Change in vision Dehydration--increased thirst, dry mouth, feeling faint or lightheaded, headache, dark yellow or brown urine Gallbladder problems--severe stomach pain, nausea, vomiting, fever Heart palpitations--rapid, pounding, or irregular heartbeat Kidney injury--decrease in the amount of urine, swelling of the ankles, hands, or feet Pancreatitis--severe stomach pain that spreads to your back or gets worse after eating or when touched, fever, nausea, vomiting Thoughts of suicide or self-harm, worsening mood, feelings of depression Thyroid cancer--new mass or lump in the neck, pain or trouble swallowing, trouble breathing, hoarseness Side effects that usually do not require medical attention (report these to your care team if they continue or are bothersome): Diarrhea Loss of appetite Nausea Upset stomach This list may not describe all possible side effects. Call your doctor for medical  advice about side effects. You may report side effects to FDA at 1-800-FDA-1088. Where should I keep my medication? Keep out of the reach of children. Store unopened pens in a refrigerator between 2 and 8 degrees C (36 and 46 degrees F). Do not freeze. Protect from light and heat. After you first use the pen, it can be stored for 56 days at room  temperature between 15 and 30 degrees C (59 and 86 degrees F) or in a refrigerator. Throw away your used pen after 56 days or after the expiration date, whichever comes first. Do not store your pen with the needle attached. If the needle is left on, medication may leak from the pen. NOTE: This sheet is a summary. It may not cover all possible information. If you have questions about this medicine, talk to your doctor, pharmacist, or health care provider.  2024 Elsevier/Gold Standard (2022-11-05 00:00:00)

## 2023-07-01 NOTE — Progress Notes (Signed)
Assessment and Plan:  Sherry Dennis was seen today for an episodic visit.  Diagnoses and all order for this visit:  Type 2 diabetes mellitus without complication, without long-term current use of insulin (HCC) Increase Ozempic to 0.5 mg/week as directed. Education: Reviewed 'ABCs' of diabetes management  Discussed goals to be met and/or maintained include A1C (<7) Blood pressure (<130/80) Cholesterol (LDL <70) Continue Eye Exam yearly  Continue Dental Exam Q6 mo Discussed dietary recommendations Discussed Physical Activity recommendations  Mixed hyperlipidemia/aortic atherosclerosis Start Atorvastatin Discussed lifestyle modifications. Recommended diet heavy in fruits and veggies, omega 3's. Decrease consumption of animal meats, cheeses, and dairy products. Remain active and exercise as tolerated. Continue to monitor. Check lipids/TSH  Morbid Obesity Trending down Discussed appropriate BMI Diet modification. Physical activity. Encouraged/praised to build confidence.  Medication management All medications discussed and reviewed in full. All questions and concerns regarding medications addressed.    Vaginal candida Secondary to multiple abx use Start Diflucan as directed.  Orders Placed This Encounter  Procedures   CBC with Differential/Platelet   COMPLETE METABOLIC PANEL WITH GFR   Lipid panel   Hemoglobin A1c   Meds ordered this encounter  Medications   Semaglutide, 1 MG/DOSE, 4 MG/3ML SOPN    Sig: Inject 1 mg into the skin once a week.    Dispense:  3 mL    Refill:  2    Order Specific Question:   Supervising Provider    Answer:   Lucky Cowboy [[id number]]   fluconazole (DIFLUCAN) 150 MG tablet    Sig: Take 1 tablet (150 mg) at the sign of symptoms.  If not resolved after 72 hours may take additional 150 mg dosage.    Dispense:  3 tablet    Refill:  0    Order Specific Question:   Supervising Provider    Answer:   Lucky Cowboy [[id number]]   ondansetron  (ZOFRAN) 4 MG tablet    Sig: Take 1 tablet (4 mg total) by mouth every 8 (eight) hours as needed for nausea or vomiting.    Dispense:  20 tablet    Refill:  0    Order Specific Question:   Supervising Provider    Answer:   Lucky Cowboy [phone]   Notify office for further evaluation and treatment, questions or concerns if any reported s/s fail to improve.   The patient was advised to call back or seek an in-person evaluation if any symptoms worsen or if the condition fails to improve as anticipated.   Further disposition pending results of labs. Discussed med's effects and SE's.    I discussed the assessment and treatment plan with the patient. The patient was provided an opportunity to ask questions and all were answered. The patient agreed with the plan and demonstrated an understanding of the instructions.  Discussed med's effects and SE's. Screening labs and tests as requested with regular follow-up as recommended.  I provided 30 minutes of face-to-face time during this encounter including counseling, chart review, and critical decision making was preformed.  Today's Plan of Care is based on a patient-centered health care approach known as shared decision making - the decisions, tests and treatments allow for patient preferences and values to be balanced with clinical evidence.    Future Appointments  Date Time Provider Department Center  01/14/2024 10:00 AM Adela Glimpse, NP [name] None    ------------------------------------------------------------------------------------------------------------------   HPI BP 118/80   Pulse 67   Temp 98 F (36.7 C)   Ht 5'  5" (1.651 m)   Wt 222 lb (100.7 kg)   SpO2 98%   BMI 36.94 kg/m   62 y.o.female presents for evaluation of medication management.  During last OV patient blood work revealed elevation in A1c from pre-diabetes to diabetes mellitus.  She started on Ozempic 0.25 mg/week and has been successful with weight and  dietary control.  She has increased dose to 0.5 mg weekly with positive results.  She has had some nausea but treatable with PRN Ondansetron.    Lab Results  Component Value Date   HGBA1C 6.5 (H) 01/14/2023   She has elevation in LDL and aortic atherosclerosis.  She also started Atorvastatin medication and is currently controlling cholesterol via lifestyle and weight loss. Her cholesterol is at goal. The cholesterol last visit was:   Lab Results  Component Value Date   CHOL 180 01/14/2023   HDL 52 01/14/2023   LDLCALC 100 (H) 01/14/2023   TRIG 184 (H) 01/14/2023   CHOLHDL 3.5 01/14/2023   BMI is Body mass index is 36.94 kg/m., she has been working on diet and exercise. Wt Readings from Last 3 Encounters:  07/01/23 222 lb (100.7 kg)  05/14/23 229 lb 11.5 oz (104.2 kg)  04/29/23 234 lb 6.4 oz (106.3 kg)   She recently had right hand surgery s/p displaced fx of proximal phalanx with treatment of multiple abx.  Since completing she has noticed increase in vaginal candida.  Has been using OTC Monistat without effectiveness.     Past Medical History:  Diagnosis Date   Allergy    Anemia    Anxiety    Arthritis    "knees, hands, ankles, ~ every joint" (04/08/2015)   Basal cell carcinoma    "face, hands, chest" (04/08/2015)   Chronic bronchitis (HCC)    "get it pretty much q yr" (04/08/2015)   COPD (chronic obstructive pulmonary disease) (HCC)    Diabetes mellitus without complication (HCC)    Dysrhythmia    palpitations occ; SVT 09/2013 s/p adenosine   GERD (gastroesophageal reflux disease)    H/O hiatal hernia    Hx of cardiovascular stress test    Lexiscan Myoview 6/16: Ejection fraction is 60% and wall motion is normal. The study is normal. There is no scar or ischemia. This is a low risk scan.   Hyperlipidemia    Hypertension    Hypothyroidism    Palpitations    PONV (postoperative nausea and vomiting)    S/P total knee arthroplasty 02/15/2014   Sleep apnea    uses CPAP  nightly   Thyroid disease    Vitamin D deficiency      Allergies  Allergen Reactions   Darvon [Propoxyphene Hcl] Anaphylaxis and Other (See Comments)    Airway swelling    Propoxyphene Anaphylaxis    Throat closes up   Doxycycline Nausea Only   Hydromorphone Nausea Only   Oseltamivir Hives and Other (See Comments)    Hives, elevated BP   Other     Any narcotic med but morphine makes pt nauseated    Oxycodone-Acetaminophen Nausea Only   Tamiflu [Oseltamivir Phosphate] Hives and Other (See Comments)    Increased blood pressure    Current Outpatient Medications on File Prior to Visit  Medication Sig   abatacept (ORENCIA) 250 MG injection as directed Intravenous   acetaminophen (TYLENOL) 650 MG CR tablet Take 650 mg by mouth 3 (three) times daily as needed for pain.   atorvastatin (LIPITOR) 10 MG tablet Take 1 tablet (10  mg total) by mouth daily.   Azelastine HCl 137 MCG/SPRAY SOLN 1 (ONE) SPRAY TO EACH NOSTRRIL TWICE A DAY   bisoprolol-hydrochlorothiazide (ZIAC) 10-6.25 MG tablet TAKE 1 TABLET BY MOUTH EVERY MORNING FOR BLOOD PRESSURE   Budeson-Glycopyrrol-Formoterol (BREZTRI AEROSPHERE) 160-9-4.8 MCG/ACT AERO Inhale 1 Pump into the lungs in the morning and at bedtime.   busPIRone (BUSPAR) 10 MG tablet TAKE 1 TABLET 3 TIMES A DAY FOR ANXIETY **INS 30 DAY**   cyanocobalamin (VITAMIN B12) 1000 MCG/ML injection INJECT 1 ML ( ) ONCE WEEKLY FOR 6 WEEKS, THEN ONCE A MONTH   cyclobenzaprine (FLEXERIL) 10 MG tablet Take 1 tablet (10 mg total) by mouth 3 (three) times daily as needed for muscle spasms.   escitalopram (LEXAPRO) 20 MG tablet TAKE 1 TABLET BY MOUTH EVERY DAY   folic acid (FOLVITE) 1 MG tablet TAKE 2 TABLETS (2 MG TOTAL) BY MOUTH AT BEDTIME.   levocetirizine (XYZAL) 5 MG tablet Take 5 mg by mouth every evening.   levothyroxine (SYNTHROID) 137 MCG tablet TAKE 1 TABLET DAILY ON AN EMPTY STOMACH WITH ONLY WATER FOR 30 MINUTES & NO ANTACID MEDS, CALCIUM OR MAGNESIUM FOR 4  HOURS & AVOID BIOTIN   meclizine (ANTIVERT) 25 MG tablet Take 1 tablet (25 mg total) by mouth 3 (three) times daily as needed for dizziness.   Menthol, Topical Analgesic, (BIOFREEZE ROLL-ON EX) Apply 1 application  topically daily as needed (pain).   methocarbamol (ROBAXIN) 500 MG tablet Take 500-1,000 mg by mouth every 6 (six) hours as needed.   methotrexate 50 MG/2ML injection Inject 15 mg into the muscle once a week.   naproxen (NAPROSYN) 500 MG tablet Take 1 tablet (500 mg total) by mouth 2 (two) times daily with a meal.   olmesartan (BENICAR) 40 MG tablet TAKE 1 TABLET BY MOUTH EVERY DAY FOR BLOOD PRESSURE   pantoprazole (PROTONIX) 20 MG tablet TAKE 1 TABLET DAILY FOR INDIGESTION & HEARTBURN   traMADol (ULTRAM) 50 MG tablet 1-2 tabs PO q6 hours prn pain   triamcinolone cream (KENALOG) 0.5 % Apply 1 Application topically 2 (two) times daily.   Vitamin D, Ergocalciferol, (DRISDOL) 1.25 MG (50000 UNIT) CAPS capsule TAKE 1 CAPSULE BY MOUTH 3 DAYS A WEEK FOR VITAMIN DEFICIENCY   doxycycline (VIBRAMYCIN) 50 MG capsule Take 2 capsules (100 mg total) by mouth 2 (two) times daily.   No current facility-administered medications on file prior to visit.    ROS: all negative except what is noted in the HPI.   Physical Exam:  BP 118/80   Pulse 67   Temp 98 F (36.7 C)   Ht 5\' 5"  (1.651 m)   Wt 222 lb (100.7 kg)   SpO2 98%   BMI 36.94 kg/m   General Appearance: NAD.  Awake, conversant and cooperative. Eyes: PERRLA, EOMs intact.  Sclera white.  Conjunctiva without erythema. Sinuses: No frontal/maxillary tenderness.  No nasal discharge. Nares patent.  ENT/Mouth: Ext aud canals clear.  Bilateral TMs w/DOL and without erythema or bulging. Hearing intact.  Posterior pharynx without swelling or exudate.  Tonsils without swelling or erythema.  Neck: Supple.  No masses, nodules or thyromegaly. Respiratory: Effort is regular with non-labored breathing. Breath sounds are equal bilaterally without  rales, rhonchi, wheezing or stridor.  Cardio: RRR with no MRGs. Brisk peripheral pulses without edema.  Abdomen: Active BS in all four quadrants.  Soft and non-tender without guarding, rebound tenderness, hernias or masses. Lymphatics: Non tender without lymphadenopathy.  Musculoskeletal: Full ROM, 5/5 strength, normal ambulation.  No clubbing or cyanosis. Skin: Appropriate color for ethnicity. Warm without rashes, lesions, ecchymosis, ulcers.  Neuro: CN II-XII grossly normal. Normal muscle tone without cerebellar symptoms and intact sensation.   Psych: AO X 3,  appropriate mood and affect, insight and judgment.     Adela Glimpse, NP 3:11 PM [facility]

## 2023-07-02 ENCOUNTER — Other Ambulatory Visit: Payer: Self-pay | Admitting: Nurse Practitioner

## 2023-07-02 LAB — COMPLETE METABOLIC PANEL WITH GFR
AG Ratio: 1.5 (calc) (ref 1.0–2.5)
ALT: 9 U/L (ref 6–29)
AST: 11 U/L (ref 10–35)
Albumin: 3.9 g/dL (ref 3.6–5.1)
Alkaline phosphatase (APISO): 93 U/L (ref 37–153)
BUN: 12 mg/dL (ref 7–25)
CO2: 28 mmol/L (ref 20–32)
Calcium: 9.4 mg/dL (ref 8.6–10.4)
Chloride: 103 mmol/L (ref 98–110)
Creat: 0.91 mg/dL (ref 0.50–1.05)
Globulin: 2.6 g/dL (ref 1.9–3.7)
Glucose, Bld: 87 mg/dL (ref 65–99)
Potassium: 3.8 mmol/L (ref 3.5–5.3)
Sodium: 138 mmol/L (ref 135–146)
Total Bilirubin: 0.3 mg/dL (ref 0.2–1.2)
Total Protein: 6.5 g/dL (ref 6.1–8.1)
eGFR: 72 mL/min/{1.73_m2} (ref >=60)

## 2023-07-02 LAB — CBC WITH DIFFERENTIAL/PLATELET
Absolute Lymphocytes: 2809 {cells}/uL (ref 850–3900)
Absolute Monocytes: 818 {cells}/uL (ref 200–950)
Basophils Absolute: 37 {cells}/uL (ref 0–200)
Basophils Relative: 0.4 %
Eosinophils Absolute: 140 {cells}/uL (ref 15–500)
Eosinophils Relative: 1.5 %
HCT: 35.4 % (ref 35.0–45.0)
Hemoglobin: 11.4 g/dL — ABNORMAL LOW (ref 11.7–15.5)
MCH: 30.9 pg (ref 27.0–33.0)
MCHC: 32.2 g/dL (ref 32.0–36.0)
MCV: 95.9 fL (ref 80.0–100.0)
MPV: 11.5 fL (ref 7.5–12.5)
Monocytes Relative: 8.8 %
Neutro Abs: 5496 {cells}/uL (ref 1500–7800)
Neutrophils Relative %: 59.1 %
Platelets: 278 10*3/uL (ref 140–400)
RBC: 3.69 10*6/uL — ABNORMAL LOW (ref 3.80–5.10)
RDW: 13.1 % (ref 11.0–15.0)
Total Lymphocyte: 30.2 %
WBC: 9.3 10*3/uL (ref 3.8–10.8)

## 2023-07-02 LAB — LIPID PANEL
Cholesterol: 124 mg/dL (ref <200)
HDL: 47 mg/dL — ABNORMAL LOW (ref >=50)
LDL Cholesterol (Calc): 59 mg/dL
Non-HDL Cholesterol (Calc): 77 mg/dL (ref <130)
Total CHOL/HDL Ratio: 2.6 (calc) (ref <5.0)
Triglycerides: 96 mg/dL (ref <150)

## 2023-07-02 LAB — HEMOGLOBIN A1C
Hgb A1c MFr Bld: 6.1 %{Hb} — ABNORMAL HIGH (ref <5.7)
Mean Plasma Glucose: 128 mg/dL
eAG (mmol/L): 7.1 mmol/L

## 2023-07-03 ENCOUNTER — Other Ambulatory Visit: Payer: Self-pay | Admitting: Nurse Practitioner

## 2023-07-30 ENCOUNTER — Other Ambulatory Visit: Payer: Self-pay | Admitting: Orthopedic Surgery

## 2023-07-30 DIAGNOSIS — M542 Cervicalgia: Secondary | ICD-10-CM

## 2023-08-09 ENCOUNTER — Other Ambulatory Visit: Payer: Self-pay | Admitting: Nurse Practitioner

## 2023-08-17 ENCOUNTER — Ambulatory Visit
Admission: RE | Admit: 2023-08-17 | Discharge: 2023-08-17 | Disposition: A | Discharge status: Home / Self Care | Payer: 59 | Source: Ambulatory Visit | Attending: Orthopedic Surgery | Admitting: Orthopedic Surgery

## 2023-08-17 DIAGNOSIS — M542 Cervicalgia: Secondary | ICD-10-CM

## 2023-08-23 ENCOUNTER — Encounter: Payer: Self-pay | Admitting: Nurse Practitioner

## 2023-10-01 ENCOUNTER — Ambulatory Visit: Payer: 59 | Admitting: Nurse Practitioner

## 2023-10-08 ENCOUNTER — Other Ambulatory Visit: Payer: Self-pay

## 2023-10-08 DIAGNOSIS — E119 Type 2 diabetes mellitus without complications: Secondary | ICD-10-CM

## 2023-10-08 MED ORDER — SEMAGLUTIDE (1 MG/DOSE) 4 MG/3ML ~~LOC~~ SOPN: 1.0000 mg | PEN_INJECTOR | SUBCUTANEOUS | 2 refills | Status: DC

## 2023-11-12 ENCOUNTER — Other Ambulatory Visit (HOSPITAL_COMMUNITY): Payer: Self-pay

## 2023-11-12 DIAGNOSIS — E782 Mixed hyperlipidemia: Secondary | ICD-10-CM

## 2023-11-21 ENCOUNTER — Other Ambulatory Visit: Payer: Self-pay | Admitting: Medical Genetics

## 2023-11-26 ENCOUNTER — Ambulatory Visit (HOSPITAL_COMMUNITY)
Admission: RE | Admit: 2023-11-26 | Discharge: 2023-11-26 | Disposition: A | Discharge status: Home / Self Care | Payer: Self-pay | Source: Ambulatory Visit

## 2023-11-26 ENCOUNTER — Other Ambulatory Visit (HOSPITAL_COMMUNITY)
Admission: RE | Admit: 2023-11-26 | Discharge: 2023-11-26 | Disposition: A | Discharge status: Home / Self Care | Payer: Self-pay | Source: Ambulatory Visit | Attending: Medical Genetics | Admitting: Medical Genetics

## 2023-11-26 DIAGNOSIS — E782 Mixed hyperlipidemia: Secondary | ICD-10-CM | POA: Insufficient documentation

## 2023-12-02 ENCOUNTER — Telehealth: Payer: Self-pay | Admitting: *Deleted

## 2023-12-03 ENCOUNTER — Ambulatory Visit (AMBULATORY_SURGERY_CENTER): Admitting: *Deleted

## 2023-12-03 VITALS — Ht 65.0 in | Wt 209.0 lb

## 2023-12-03 DIAGNOSIS — Z860100 Personal history of colon polyps, unspecified: Secondary | ICD-10-CM

## 2023-12-03 MED ORDER — PLENVU 140 G PO SOLR: 1.0000 | ORAL | 0 refills | Status: DC

## 2023-12-03 NOTE — Telephone Encounter (Signed)
 MD states pt is OK to have procedure at [facility]

## 2023-12-03 NOTE — Telephone Encounter (Signed)
 Agree, thanks

## 2023-12-03 NOTE — Progress Notes (Signed)
 Pt's name and DOB verified at the beginning of the pre-visit wit 2 identifiers  Permission given to speak with  Pt denies any difficulty with ambulating,sitting, laying down or rolling side to side  Pt has no issues with ambulation   Pt has no issues moving head neck or swallowing  No egg or soy allergy known to patient   Hx PONV  Pt denies having issues being intubated  No FH of Malignant Hyperthermia  Pt is not on diet pills or shots  Pt is not on home 02   Pt is not on blood thinners   Pt has frequent issues with constipation RN instructed pt to use Miralax  per bottles instructions a week before prep days. Pt states they will  Pt is not on dialysis  Pt hx of SVT  Pt denies any upcoming cardiac testing  Patient's chart reviewed by Rogena Class CNRA prior to pre-visit and patient appropriate for the [facility].  Pre-visit completed and red dot placed by patient's name on their procedure day (on provider's schedule).     Visit in person  Pt states weight is  IInstructions reviewed. Pt given , [facility] main # and MD on call # prior to instructions.  Pt states understanding of instructions. Instructed to review again prior to procedure. Pt states they will.   Instructions given to pt Instructed to not do Marijuana day before or day of procedure. Pt states she won't

## 2023-12-06 LAB — GENECONNECT MOLECULAR SCREEN: Genetic Analysis Overall Interpretation: NEGATIVE

## 2023-12-11 ENCOUNTER — Encounter (HOSPITAL_COMMUNITY): Payer: Self-pay | Admitting: *Deleted

## 2023-12-17 ENCOUNTER — Encounter: Admitting: Gastroenterology

## 2023-12-25 ENCOUNTER — Encounter: Payer: Self-pay | Admitting: Gastroenterology

## 2024-01-14 ENCOUNTER — Encounter: Payer: 59 | Admitting: Nurse Practitioner

## 2024-01-14 LAB — AMB RESULTS CONSOLE CBG: Glucose: 111

## 2024-01-14 NOTE — Progress Notes (Unsigned)
 Pt attended screening event on 01/14/2024. Pt indicated that she does have a PCP and insurance. Pt blood sugar was 111 and blood pressure was 123/82. Pt indicated no SDOH needs at this time. Pt was given no further recommendations at this time.

## 2024-02-05 ENCOUNTER — Encounter: Payer: Self-pay | Admitting: Gastroenterology

## 2024-02-05 ENCOUNTER — Ambulatory Visit: Admitting: Gastroenterology

## 2024-02-05 VITALS — BP 150/84 | HR 54 | Temp 97.7°F | Resp 17 | Ht 65.0 in | Wt 209.0 lb

## 2024-02-05 DIAGNOSIS — Z1211 Encounter for screening for malignant neoplasm of colon: Secondary | ICD-10-CM

## 2024-02-05 DIAGNOSIS — Z860101 Personal history of adenomatous and serrated colon polyps: Secondary | ICD-10-CM | POA: Diagnosis not present

## 2024-02-05 DIAGNOSIS — Z860100 Personal history of colon polyps, unspecified: Secondary | ICD-10-CM

## 2024-02-05 MED ORDER — SODIUM CHLORIDE 0.9 % IV SOLN: 500.0000 mL | INTRAVENOUS | Status: DC

## 2024-02-05 NOTE — Progress Notes (Unsigned)
 Pt's states no medical or surgical changes since previsit or office visit.

## 2024-02-05 NOTE — Progress Notes (Unsigned)
 [facility] History and Physical   Primary Care Physician:  Tonita Fallow, MD   Reason for Procedure:  History of adenomatous colon polyps  Plan:    Surveillance colonoscopy with possible interventions as needed     HPI: Sherry Dennis is a very pleasant 63 y.o. female here for surveillance colonoscopy. Denies any nausea, vomiting, abdominal pain, melena or bright red blood per rectum. Last dosef GLP1 was 3 days ago, discussed potential risk for aspiration due to gastroparesis, patient decided to proceed.  The risks and benefits as well as alternatives of endoscopic procedure(s) have been discussed and reviewed. All questions answered. The patient agrees to proceed.    Past Medical History:  Diagnosis Date   Allergy    Anemia    Anxiety    Arthritis    knees, hands, ankles, ~ every joint (04/08/2015)   Basal cell carcinoma    face, hands, chest (04/08/2015)   Chronic bronchitis (HCC)    get it pretty much q yr (04/08/2015)   COPD (chronic obstructive pulmonary disease) (HCC)    Diabetes mellitus without complication (HCC)    Dysrhythmia    palpitations occ; SVT 09/2013 s/p adenosine    GERD (gastroesophageal reflux disease)    H/O hiatal hernia    Hx of cardiovascular stress test    Lexiscan  Myoview 6/16: Ejection fraction is 60% and wall motion is normal. The study is normal. There is no scar or ischemia. This is a low risk scan.   Hyperlipidemia    Hypertension    Hypothyroidism    Osteoporosis    Palpitations    PONV (postoperative nausea and vomiting)    S/P total knee arthroplasty 02/15/2014   Sleep apnea    uses CPAP nightly   Thyroid  disease    Vitamin D  deficiency     Past Surgical History:  Procedure Laterality Date   BASAL CELL CARCINOMA EXCISION  2014   off my chest   BLADDER REPAIR  1993   lasered the holes shut   CARDIAC CATHETERIZATION  2017   CARDIAC SURGERY     CARPAL TUNNEL RELEASE Right 1990   CLOSED REDUCTION FINGER  WITH PERCUTANEOUS PINNING Right 03/28/2023   Procedure: CLOSED REDUCTION PERCUTANEOUS PINNING RIGHT SMALL FINGER PROXIMAL PHALANX FRACTURE AND CLOSED REDUCTION PERCUTANEOUS PINNING RIGHT RING FINGER PROXIMAL PHALANX AND RIGHT SMALL METACARPAL HEAD;  Surgeon: Murrell Drivers, MD;  Location: [facility];  Service: Orthopedics;  Laterality: Right;   COLONOSCOPY     COLONOSCOPY WITH PROPOFOL  N/A 11/25/2017   Procedure: COLONOSCOPY WITH PROPOFOL ;  Surgeon: Shila Gustav GAILS, MD;  Location: [facility];  Service: Endoscopy;  Laterality: N/A;   ELECTROPHYSIOLOGIC STUDY N/A 04/08/2015   Procedure: SVT Ablation;  Surgeon: Danelle LELON Birmingham, MD;  Location: [facility];  Service: Cardiovascular;  Laterality: N/A;   ENDOMETRIAL ABLATION  2009   HARDWARE REMOVAL Right 05/14/2023   Procedure: REMOVAL RETAINED K-WIRES RIGHT HAND;  Surgeon: Murrell Drivers, MD;  Location: [facility];  Service: Orthopedics;  Laterality: Right;  30 MIN   INCONTINENCE SURGERY  2010   JOINT REPLACEMENT     KNEE ARTHROSCOPY Right 2013   KNEE ARTHROSCOPY     LAPAROSCOPIC GASTRIC SLEEVE RESECTION N/A 05/26/2019   Procedure: LAPAROSCOPIC GASTRIC SLEEVE RESECTION, Upper Endo, ERAS Pathway;  Surgeon: Tanda Locus, MD;  Location: [facility];  Service: General;  Laterality: N/A;   PARTIAL KNEE ARTHROPLASTY Right 02/15/2014   Procedure: RIGHT UNICOMPARTMENTAL KNEE (MEDIAL COMPARTMENT);  Surgeon: Marcey Raman,  MD;  Location: [facility];  Service: Orthopedics;  Laterality: Right;   POLYPECTOMY     REVERSE SHOULDER ARTHROPLASTY Right 04/27/2021   Procedure: REVERSE SHOULDER ARTHROPLASTY;  Surgeon: Melita Drivers, MD;  Location: [facility];  Service: Orthopedics;  Laterality: Right;    SPLENECTOMY, TOTAL  05/26/2019   Procedure: SPLENECTOMY;  Surgeon: Tanda Locus, MD;  Location: [facility];  Service: General;;   TUBAL LIGATION  1987    Prior to Admission medications   Medication Sig Start Date End Date Taking? Authorizing  Provider  atorvastatin  (LIPITOR) 10 MG tablet Take 1 tablet (10 mg total) by mouth daily. 03/04/23  Yes Cranford, Tonya, NP  bisoprolol -hydrochlorothiazide  (ZIAC ) 10-6.25 MG tablet TAKE 1 TABLET BY MOUTH EVERY MORNING FOR BLOOD PRESSURE 07/02/23  Yes Cranford, Tonya, NP  escitalopram  (LEXAPRO ) 20 MG tablet TAKE 1 TABLET BY MOUTH EVERY DAY 07/03/23  Yes Wilkinson, Dana E, FNP  famotidine (PEPCID) 20 MG tablet 1 tablet at bedtime as needed Orally Once a day for 30 day(s) 11/12/23  Yes [provider]  fluorouracil (EFUDEX) 5 % cream Apply topically 2 (two) times daily. 01/16/24  Yes [provider]  levothyroxine  (SYNTHROID ) 137 MCG tablet TAKE 1 TABLET DAILY ON AN EMPTY STOMACH WITH ONLY WATER  FOR 30 MINUTES & NO ANTACID MEDS, CALCIUM  OR MAGNESIUM FOR 4 HOURS & AVOID BIOTIN 05/28/23  Yes Wilkinson, Dana E, FNP  meclizine  (ANTIVERT ) 25 MG tablet Take 1 tablet (25 mg total) by mouth 3 (three) times daily as needed for dizziness. 10/06/19  Yes Long, Joshua G, MD  meloxicam (MOBIC) 15 MG tablet Take 15 mg by mouth daily. 11/22/23  Yes [provider]  olmesartan  (BENICAR ) 40 MG tablet TAKE 1 TABLET BY MOUTH EVERY DAY FOR BLOOD PRESSURE 04/29/23  Yes Cranford, Tonya, NP  OVER THE COUNTER MEDICATION daily at 12 noon. Stool softner   Yes [provider]  tretinoin (RETIN-A) 0.025 % cream SMARTSIG:sparingly Topical Every Night 01/27/24  Yes [provider]  triamcinolone  cream (KENALOG ) 0.1 % APPLY TWO TIMES DAILY AS NEEDED 12/23/23  Yes [provider]  Vitamin D , Ergocalciferol , (DRISDOL ) 1.25 MG (50000 UNIT) CAPS capsule TAKE 1 CAPSULE BY MOUTH 3 DAYS A WEEK FOR VITAMIN DEFICIENCY 05/03/21  Yes Wilkinson, Dana E, FNP  abatacept (ORENCIA) 250 MG injection as directed Intravenous    [provider]  acetaminophen  (TYLENOL ) 650 MG CR tablet Take 650 mg by mouth 3 (three) times daily as needed for pain.    [provider]   Budeson-Glycopyrrol-Formoterol (BREZTRI  AEROSPHERE) 160-9-4.8 MCG/ACT AERO Inhale 1 Pump into the lungs in the morning and at bedtime. 01/22/20   Craig Alan SAUNDERS, PA-C  cyanocobalamin  (VITAMIN B12) 1000 MCG/ML injection INJECT 1 ML ( ) ONCE WEEKLY FOR 6 WEEKS, THEN ONCE A MONTH 06/30/23   Wilkinson, Dana E, FNP  cyclobenzaprine  (FLEXERIL ) 10 MG tablet Take 1 tablet (10 mg total) by mouth 3 (three) times daily as needed for muscle spasms. 04/27/21   Shuford, Randine, PA-C  doxycycline  (VIBRA -TABS) 100 MG tablet Take 100 mg by mouth 2 (two) times daily. 06/12/23   [provider]  folic acid  (FOLVITE ) 1 MG tablet TAKE 2 TABLETS (2 MG TOTAL) BY MOUTH AT BEDTIME. 08/11/23   Wilkinson, Dana E, FNP  Menthol , Topical Analgesic, (BIOFREEZE ROLL-ON EX) Apply 1 application  topically daily as needed (pain).    [provider]  methocarbamol  (ROBAXIN ) 500 MG tablet Take 500-1,000 mg by mouth every 6 (six) hours as needed.  [provider]  methotrexate 50 MG/2ML injection Inject 15 mg into the muscle once a week. 10/05/19   [provider]  naproxen  (NAPROSYN ) 500 MG tablet Take 1 tablet (500 mg total) by mouth 2 (two) times daily with a meal. 04/27/21   Shuford, Randine, PA-C  ondansetron  (ZOFRAN ) 4 MG tablet Take 1 tablet (4 mg total) by mouth every 8 (eight) hours as needed for nausea or vomiting. 07/01/23   Cranford, Tonya, NP  tirzepatide (ZEPBOUND) 5 MG/0.5ML Pen 0.5 mL Subcutaneous 2 mL for 30 days 11/12/23   [provider]    Current Outpatient Medications  Medication Sig Dispense Refill   atorvastatin  (LIPITOR) 10 MG tablet Take 1 tablet (10 mg total) by mouth daily. 90 tablet 2   bisoprolol -hydrochlorothiazide  (ZIAC ) 10-6.25 MG tablet TAKE 1 TABLET BY MOUTH EVERY MORNING FOR BLOOD PRESSURE 30 tablet 5   escitalopram  (LEXAPRO ) 20 MG tablet TAKE 1 TABLET BY MOUTH EVERY DAY 30 tablet 14   famotidine (PEPCID) 20 MG tablet 1 tablet at bedtime as needed  Orally Once a day for 30 day(s)     fluorouracil (EFUDEX) 5 % cream Apply topically 2 (two) times daily.     levothyroxine  (SYNTHROID ) 137 MCG tablet TAKE 1 TABLET DAILY ON AN EMPTY STOMACH WITH ONLY WATER  FOR 30 MINUTES & NO ANTACID MEDS, CALCIUM  OR MAGNESIUM FOR 4 HOURS & AVOID BIOTIN 30 tablet 11   meclizine  (ANTIVERT ) 25 MG tablet Take 1 tablet (25 mg total) by mouth 3 (three) times daily as needed for dizziness. 30 tablet 0   meloxicam (MOBIC) 15 MG tablet Take 15 mg by mouth daily.     olmesartan  (BENICAR ) 40 MG tablet TAKE 1 TABLET BY MOUTH EVERY DAY FOR BLOOD PRESSURE 30 tablet 11   OVER THE COUNTER MEDICATION daily at 12 noon. Stool softner     tretinoin (RETIN-A) 0.025 % cream SMARTSIG:sparingly Topical Every Night     triamcinolone  cream (KENALOG ) 0.1 % APPLY TWO TIMES DAILY AS NEEDED     Vitamin D , Ergocalciferol , (DRISDOL ) 1.25 MG (50000 UNIT) CAPS capsule TAKE 1 CAPSULE BY MOUTH 3 DAYS A WEEK FOR VITAMIN DEFICIENCY 12 capsule 5   abatacept (ORENCIA) 250 MG injection as directed Intravenous     acetaminophen  (TYLENOL ) 650 MG CR tablet Take 650 mg by mouth 3 (three) times daily as needed for pain.     Budeson-Glycopyrrol-Formoterol (BREZTRI  AEROSPHERE) 160-9-4.8 MCG/ACT AERO Inhale 1 Pump into the lungs in the morning and at bedtime. 10.7 g 1   cyanocobalamin  (VITAMIN B12) 1000 MCG/ML injection INJECT 1 ML ( ) ONCE WEEKLY FOR 6 WEEKS, THEN ONCE A MONTH 4 mL 7   cyclobenzaprine  (FLEXERIL ) 10 MG tablet Take 1 tablet (10 mg total) by mouth 3 (three) times daily as needed for muscle spasms. 30 tablet 1   doxycycline  (VIBRA -TABS) 100 MG tablet Take 100 mg by mouth 2 (two) times daily.     folic acid  (FOLVITE ) 1 MG tablet TAKE 2 TABLETS (2 MG TOTAL) BY MOUTH AT BEDTIME. 30 tablet 9   Menthol , Topical Analgesic, (BIOFREEZE ROLL-ON EX) Apply 1 application  topically daily as needed (pain).     methocarbamol  (ROBAXIN ) 500 MG tablet Take 500-1,000 mg by mouth every 6 (six) hours as needed.      methotrexate 50 MG/2ML injection Inject 15 mg into the muscle once a week.     naproxen  (NAPROSYN ) 500 MG tablet Take 1 tablet (500 mg total) by mouth 2 (two) times daily with a meal. 60  tablet 1   ondansetron  (ZOFRAN ) 4 MG tablet Take 1 tablet (4 mg total) by mouth every 8 (eight) hours as needed for nausea or vomiting. 20 tablet 0   tirzepatide (ZEPBOUND) 5 MG/0.5ML Pen 0.5 mL Subcutaneous 2 mL for 30 days     Current Facility-Administered Medications  Medication Dose Route Frequency Provider Last Rate Last Admin   0.9 %  sodium chloride  infusion  500 mL Intravenous Continuous Rhea Thrun V, MD        Allergies as of 02/05/2024 - Review Complete 02/05/2024  Allergen Reaction Noted   Darvon [propoxyphene hcl] Anaphylaxis and Other (See Comments) 12/21/2011   Propoxyphene Anaphylaxis 07/21/2015   Doxycycline  Nausea Only 08/30/2017   Hydromorphone  Nausea Only 03/21/2023   Oseltamivir Hives and Other (See Comments) 07/21/2015   Other Nausea Only 04/10/2021   Oxycodone -acetaminophen  Nausea Only 03/21/2023   Tamiflu [oseltamivir phosphate] Hives and Other (See Comments) 12/21/2011    Family History  Problem Relation Age of Onset   Hypertension Mother    Atrial fibrillation Mother    Diabetes Father    Emphysema Father    COPD Father    Breast cancer Paternal Aunt        x 4 aunts,    Stroke Maternal Grandmother    Heart attack Maternal Grandmother    Hyperlipidemia Maternal Grandfather    Heart disease Maternal Grandfather    Hyperlipidemia Paternal Grandmother    Heart disease Paternal Grandmother    Hyperlipidemia Paternal Grandfather    Heart disease Paternal Grandfather    Colon cancer Neg Hx    Colon polyps Neg Hx    Esophageal cancer Neg Hx    Rectal cancer Neg Hx    Ulcerative colitis Neg Hx    Stomach cancer Neg Hx     Social History   Socioeconomic History   Marital status: Divorced    Spouse name: Not on file   Number of children: 2   Years of  education: Not on file   Highest education level: Not on file  Occupational History   Not on file  Tobacco Use   Smoking status: Every Day    Current packs/day: 1.50    Average packs/day: 1.5 packs/day for 30.0 years (45.0 ttl pk-yrs)    Types: Cigarettes   Smokeless tobacco: Never  Vaping Use   Vaping status: Never Used  Substance and Sexual Activity   Alcohol use: No   Drug use: Yes    Types: Marijuana    Comment: last used March 2025   Sexual activity: Not Currently    Birth control/protection: Post-menopausal, Surgical    Comment: ablation  Other Topics Concern   Not on file  Social History Narrative   Not on file   Social Drivers of Health   Financial Resource Strain: Not on file  Food Insecurity: No Food Insecurity (01/14/2024)   Hunger Vital Sign    Worried About Running Out of Food in the Last Year: Never true    Ran Out of Food in the Last Year: Never true  Transportation Needs: No Transportation Needs (01/14/2024)   PRAPARE - Administrator, Civil Service (Medical): No    Lack of Transportation (Non-Medical): No  Physical Activity: Not on file  Stress: Not on file  Social Connections: Unknown (12/25/2021)   Received from [facility]   Social Network    Social Network: Not on file  Intimate Partner Violence: Not At Risk (01/14/2024)   Humiliation, Afraid, Rape, and  Kick questionnaire    Fear of Current or Ex-Partner: No    Emotionally Abused: No    Physically Abused: No    Sexually Abused: No    Review of Systems:  All other review of systems negative except as mentioned in the HPI.  Physical Exam: Vital signs in last 24 hours: BP 125/76   Pulse 62   Temp 97.7 F (36.5 C)   Ht 5' 5 (1.651 m)   Wt 209 lb (94.8 kg)   SpO2 96%   BMI 34.78 kg/m  General:   Alert, NAD Lungs:  Clear .   Heart:  Regular rate and rhythm Abdomen:  Soft, nontender and nondistended. Neuro/Psych:  Alert and cooperative. Normal mood and affect. A and O x  3  Reviewed labs, radiology imaging, old records and pertinent past GI work up  Patient is appropriate for planned procedure(s) and anesthesia in an ambulatory setting   K. Veena Staysha Truby , MD [phone]

## 2024-02-05 NOTE — Progress Notes (Unsigned)
 Pt sedate, gd SR's, VSS, report to RN

## 2024-02-05 NOTE — Op Note (Signed)
 [facility] Patient Name: Sherry Dennis Procedure Date: 02/05/2024 10:03 AM MRN: [id number] Endoscopist: Gustav ALONSO Mcgee , MD, [id number] Age: 63 Referring MD:  Date of Birth: 1960-11-13 Gender: Female Account #: [id number] Procedure:                Colonoscopy Indications:              High risk colon cancer surveillance: Personal                            history of adenoma (10 mm or greater in size), High                            risk colon cancer surveillance: Personal history of                            multiple (3 or more) adenomas Medicines:                Monitored Anesthesia Care Procedure:                Pre-Anesthesia Assessment:                           - Prior to the procedure, a History and Physical                            was performed, and patient medications and                            allergies were reviewed. The patient's tolerance of                            previous anesthesia was also reviewed. The risks                            and benefits of the procedure and the sedation                            options and risks were discussed with the patient.                            All questions were answered, and informed consent                            was obtained. Prior Anticoagulants: The patient has                            taken no anticoagulant or antiplatelet agents. ASA                            Grade Assessment: III - A patient with severe                            systemic disease. After reviewing the risks and  benefits, the patient was deemed in satisfactory                            condition to undergo the procedure.                           After obtaining informed consent, the colonoscope                            was passed under direct vision. Throughout the                            procedure, the patient's blood pressure, pulse, and                            oxygen saturations were  monitored continuously. The                            Olympus Scope [id number] was introduced through the                            anus with the intention of advancing to the cecum.                            The scope was advanced to the descending colon                            before the procedure was aborted. Medications were                            given. The colonoscopy was performed without                            difficulty. The patient tolerated the procedure                            well. The quality of the bowel preparation was                            poor. The rectum was photographed. Scope In: 10:15:54 AM Scope Out: 10:18:11 AM Total Procedure Duration: 0 hours 2 minutes 17 seconds  Findings:                 The perianal and digital rectal examinations were                            normal.                           A moderate amount of stool was found in the rectum,                            in the sigmoid colon and in the descending colon,  interfering with visualization. Lavage of the area                            was performed, resulting in incomplete clearance                            with continued poor visualization. Complications:            No immediate complications. Estimated Blood Loss:     Estimated blood loss: none. Impression:               - Preparation of the colon was poor.                           - Stool in the rectum, in the sigmoid colon and in                            the descending colon.                           - No specimens collected. Recommendation:           - Patient has a contact number available for                            emergencies. The signs and symptoms of potential                            delayed complications were discussed with the                            patient. Return to normal activities tomorrow.                            Written discharge instructions were provided to  the                            patient.                           - Resume previous diet.                           - Continue present medications.                           - Repeat colonoscopy at the next available                            appointment because the bowel preparation was                            suboptimal. Sherry Hartman V. [name], MD 02/05/2024 10:26:06 AM This report has been signed electronically.

## 2024-02-05 NOTE — Patient Instructions (Addendum)
 Resume all of your previous medications today as ordered.  You pre-visit is scheduled for 03/20/2024 at 1100.  You colonoscopy is scheduled for 03/22/2024  be here on the 4th floor at 0730.   YOU HAD AN ENDOSCOPIC PROCEDURE TODAY AT THE [facility]:   Refer to the procedure report that was given to you for any specific questions about what was found during the examination.  If the procedure report does not answer your questions, please call your gastroenterologist to clarify.  If you requested that your care partner not be given the details of your procedure findings, then the procedure report has been included in a sealed envelope for you to review at your convenience later.  YOU SHOULD EXPECT: Some feelings of bloating in the abdomen. Passage of more gas than usual.  Walking can help get rid of the air that was put into your GI tract during the procedure and reduce the bloating. If you had a lower endoscopy (such as a colonoscopy or flexible sigmoidoscopy) you may notice spotting of blood in your stool or on the toilet paper. If you underwent a bowel prep for your procedure, you may not have a normal bowel movement for a few days.  Please Note:  You might notice some irritation and congestion in your nose or some drainage.  This is from the oxygen used during your procedure.  There is no need for concern and it should clear up in a day or so.  SYMPTOMS TO REPORT IMMEDIATELY:  Following lower endoscopy (colonoscopy or flexible sigmoidoscopy):  Excessive amounts of blood in the stool  Significant tenderness or worsening of abdominal pains  Swelling of the abdomen that is new, acute  Fever of 100F or higher   For urgent or emergent issues, a gastroenterologist can be reached at any hour by calling [phone]. Do not use MyChart messaging for urgent concerns.    DIET:  We do recommend a small meal at first, but then you may proceed to your regular diet.  Drink plenty of fluids  but you should avoid alcoholic beverages for 24 hours.  ACTIVITY:  You should plan to take it easy for the rest of today and you should NOT DRIVE or use heavy machinery until tomorrow (because of the sedation medicines used during the test).    FOLLOW UP: Our staff will call the number listed on your records the next business day following your procedure.  We will call around 7:15- 8:00 am to check on you and address any questions or concerns that you may have regarding the information given to you following your procedure. If we do not reach you, we will leave a message.      SIGNATURES/CONFIDENTIALITY: You and/or your care partner have signed paperwork which will be entered into your electronic medical record.  These signatures attest to the fact that that the information above on your After Visit Summary has been reviewed and is understood.  Full responsibility of the confidentiality of this discharge information lies with you and/or your care-partner.

## 2024-02-06 ENCOUNTER — Telehealth: Payer: Self-pay

## 2024-02-06 NOTE — Telephone Encounter (Signed)
  Follow up Call-     02/05/2024    9:10 AM  Call back number  Post procedure Call Back phone  # [phone]  Permission to leave phone message Yes     Patient questions:  Do you have a fever, pain , or abdominal swelling? No. Pain Score  0 *  Have you tolerated food without any problems? Yes.    Have you been able to return to your normal activities? Yes.    Do you have any questions about your discharge instructions: Diet   No. Medications  No. Follow up visit  No.  Do you have questions or concerns about your Care? No.  Actions: * If pain score is 4 or above: No action needed, pain <4.

## 2024-02-12 ENCOUNTER — Ambulatory Visit: Admitting: Plastic Surgery

## 2024-02-12 ENCOUNTER — Telehealth: Payer: Self-pay

## 2024-02-12 ENCOUNTER — Encounter: Payer: Self-pay | Admitting: Plastic Surgery

## 2024-02-12 VITALS — BP 115/76 | HR 66 | Ht 65.5 in | Wt 199.4 lb

## 2024-02-12 DIAGNOSIS — Z9884 Bariatric surgery status: Secondary | ICD-10-CM

## 2024-02-12 DIAGNOSIS — M793 Panniculitis, unspecified: Secondary | ICD-10-CM | POA: Diagnosis not present

## 2024-02-12 NOTE — Telephone Encounter (Signed)
 Faxed surgical clearance to Rheumatologist Sydnee Ramsay) Phone # [phone] Fax # [phone]

## 2024-02-12 NOTE — Progress Notes (Signed)
 Referring Provider Maczis, Puja Gosai, PA-C [address] [place] [address] [facility] [place],  [place] [address]   CC:  Chief Complaint  Patient presents with   Advice Only      Sherry Dennis is an 63 y.o. female.  HPI: Ms. Sherry Dennis is a 63 year old female who underwent a gastric sleeve bariatric surgery and 2021.  She has lost in excess of 75 pounds.  She now has excess skin on the anterior abdominal wall which is frequently infected with rashes.  She states the rashes are also in the intertriginous regions.  She is able to control but not eliminate the rashes.  She is interested in having a panniculectomy to remove the lower abdominal skin and fat.  Of note the patient has rheumatoid arthritis and receives Orencia infusions on a monthly basis.  Allergies  Allergen Reactions   Darvon [Propoxyphene Hcl] Anaphylaxis and Other (See Comments)    Airway swelling    Propoxyphene Anaphylaxis    Throat closes up   Doxycycline  Nausea Only   Hydromorphone  Nausea Only   Oseltamivir Hives and Other (See Comments)    Hives, elevated BP   Other Nausea Only    Any narcotic med but morphine  makes pt nauseated    Oxycodone -Acetaminophen  Nausea Only   Tamiflu [Oseltamivir Phosphate] Hives and Other (See Comments)    Increased blood pressure    Outpatient Encounter Medications as of 02/12/2024  Medication Sig   abatacept (ORENCIA) 250 MG injection as directed Intravenous   acetaminophen  (TYLENOL ) 650 MG CR tablet Take 650 mg by mouth 3 (three) times daily as needed for pain.   atorvastatin  (LIPITOR) 10 MG tablet Take 1 tablet (10 mg total) by mouth daily.   bisoprolol -hydrochlorothiazide  (ZIAC ) 10-6.25 MG tablet TAKE 1 TABLET BY MOUTH EVERY MORNING FOR BLOOD PRESSURE   Budeson-Glycopyrrol-Formoterol (BREZTRI  AEROSPHERE) 160-9-4.8 MCG/ACT AERO Inhale 1 Pump into the lungs in the morning and at bedtime.   cyanocobalamin  (VITAMIN B12) 1000 MCG/ML injection INJECT 1 ML ( ) ONCE WEEKLY  FOR 6 WEEKS, THEN ONCE A MONTH   cyclobenzaprine  (FLEXERIL ) 10 MG tablet Take 1 tablet (10 mg total) by mouth 3 (three) times daily as needed for muscle spasms.   escitalopram  (LEXAPRO ) 20 MG tablet TAKE 1 TABLET BY MOUTH EVERY DAY   famotidine (PEPCID) 20 MG tablet 1 tablet at bedtime as needed Orally Once a day for 30 day(s)   fluorouracil (EFUDEX) 5 % cream Apply topically 2 (two) times daily.   folic acid  (FOLVITE ) 1 MG tablet TAKE 2 TABLETS (2 MG TOTAL) BY MOUTH AT BEDTIME.   levothyroxine  (SYNTHROID ) 137 MCG tablet TAKE 1 TABLET DAILY ON AN EMPTY STOMACH WITH ONLY WATER  FOR 30 MINUTES & NO ANTACID MEDS, CALCIUM  OR MAGNESIUM FOR 4 HOURS & AVOID BIOTIN   meclizine  (ANTIVERT ) 25 MG tablet Take 1 tablet (25 mg total) by mouth 3 (three) times daily as needed for dizziness.   meloxicam (MOBIC) 15 MG tablet Take 15 mg by mouth daily.   methocarbamol  (ROBAXIN ) 500 MG tablet Take 500-1,000 mg by mouth every 6 (six) hours as needed.   methotrexate 50 MG/2ML injection Inject 15 mg into the muscle once a week.   naproxen  (NAPROSYN ) 500 MG tablet Take 1 tablet (500 mg total) by mouth 2 (two) times daily with a meal.   olmesartan  (BENICAR ) 40 MG tablet TAKE 1 TABLET BY MOUTH EVERY DAY FOR BLOOD PRESSURE   ondansetron  (ZOFRAN ) 4 MG tablet Take 1 tablet (4 mg total) by  mouth every 8 (eight) hours as needed for nausea or vomiting.   OVER THE COUNTER MEDICATION daily at 12 noon. Stool softner   tirzepatide (ZEPBOUND) 5 MG/0.5ML Pen 0.5 mL Subcutaneous 2 mL for 30 days   tretinoin (RETIN-A) 0.025 % cream SMARTSIG:sparingly Topical Every Night   triamcinolone  cream (KENALOG ) 0.1 % APPLY TWO TIMES DAILY AS NEEDED   Vitamin D , Ergocalciferol , (DRISDOL ) 1.25 MG (50000 UNIT) CAPS capsule TAKE 1 CAPSULE BY MOUTH 3 DAYS A WEEK FOR VITAMIN DEFICIENCY   doxycycline  (VIBRA -TABS) 100 MG tablet Take 100 mg by mouth 2 (two) times daily. (Patient not taking: Reported on 02/12/2024)   No facility-administered encounter  medications on file as of 02/12/2024.     Past Medical History:  Diagnosis Date   Allergy    Anemia    Anxiety    Arthritis    knees, hands, ankles, ~ every joint (04/08/2015)   Basal cell carcinoma    face, hands, chest (04/08/2015)   Chronic bronchitis (HCC)    get it pretty much q yr (04/08/2015)   COPD (chronic obstructive pulmonary disease) (HCC)    Diabetes mellitus without complication (HCC)    Dysrhythmia    palpitations occ; SVT 09/2013 s/p adenosine    GERD (gastroesophageal reflux disease)    H/O hiatal hernia    Hx of cardiovascular stress test    Lexiscan  Myoview 6/16: Ejection fraction is 60% and wall motion is normal. The study is normal. There is no scar or ischemia. This is a low risk scan.   Hyperlipidemia    Hypertension    Hypothyroidism    Osteoporosis    Palpitations    PONV (postoperative nausea and vomiting)    S/P total knee arthroplasty 02/15/2014   Sleep apnea    uses CPAP nightly   Thyroid  disease    Vitamin D  deficiency     Past Surgical History:  Procedure Laterality Date   BASAL CELL CARCINOMA EXCISION  2014   off my chest   BLADDER REPAIR  1993   lasered the holes shut   CARDIAC CATHETERIZATION  2017   CARDIAC SURGERY     CARPAL TUNNEL RELEASE Right 1990   CLOSED REDUCTION FINGER WITH PERCUTANEOUS PINNING Right 03/28/2023   Procedure: CLOSED REDUCTION PERCUTANEOUS PINNING RIGHT SMALL FINGER PROXIMAL PHALANX FRACTURE AND CLOSED REDUCTION PERCUTANEOUS PINNING RIGHT RING FINGER PROXIMAL PHALANX AND RIGHT SMALL METACARPAL HEAD;  Surgeon: Murrell Drivers, MD;  Location: [facility];  Service: Orthopedics;  Laterality: Right;   COLONOSCOPY     COLONOSCOPY WITH PROPOFOL  N/A 11/25/2017   Procedure: COLONOSCOPY WITH PROPOFOL ;  Surgeon: Shila Gustav GAILS, MD;  Location: [facility];  Service: Endoscopy;  Laterality: N/A;   ELECTROPHYSIOLOGIC STUDY N/A 04/08/2015   Procedure: SVT Ablation;  Surgeon: Danelle LELON Birmingham, MD;  Location: [facility]  INVASIVE CV LAB;  Service: Cardiovascular;  Laterality: N/A;   ENDOMETRIAL ABLATION  2009   HARDWARE REMOVAL Right 05/14/2023   Procedure: REMOVAL RETAINED K-WIRES RIGHT HAND;  Surgeon: Murrell Drivers, MD;  Location:  SURGERY CENTER;  Service: Orthopedics;  Laterality: Right;  30 MIN   INCONTINENCE SURGERY  2010   JOINT REPLACEMENT     KNEE ARTHROSCOPY Right 2013   KNEE ARTHROSCOPY     LAPAROSCOPIC GASTRIC SLEEVE RESECTION N/A 05/26/2019   Procedure: LAPAROSCOPIC GASTRIC SLEEVE RESECTION, Upper Endo, ERAS Pathway;  Surgeon: Tanda Locus, MD;  Location: [facility];  Service: General;  Laterality: N/A;   PARTIAL KNEE ARTHROPLASTY Right 02/15/2014   Procedure: RIGHT  UNICOMPARTMENTAL KNEE (MEDIAL COMPARTMENT);  Surgeon: Marcey Raman, MD;  Location: [facility] OR;  Service: Orthopedics;  Laterality: Right;   POLYPECTOMY     REVERSE SHOULDER ARTHROPLASTY Right 04/27/2021   Procedure: REVERSE SHOULDER ARTHROPLASTY;  Surgeon: Melita Drivers, MD;  Location: [facility];  Service: Orthopedics;  Laterality: Right;    SPLENECTOMY, TOTAL  05/26/2019   Procedure: SPLENECTOMY;  Surgeon: Tanda Locus, MD;  Location: [facility];  Service: General;;   TUBAL LIGATION  1987    Family History  Problem Relation Age of Onset   Hypertension Mother    Atrial fibrillation Mother    Diabetes Father    Emphysema Father    COPD Father    Breast cancer Paternal Aunt        x 4 aunts,    Stroke Maternal Grandmother    Heart attack Maternal Grandmother    Hyperlipidemia Maternal Grandfather    Heart disease Maternal Grandfather    Hyperlipidemia Paternal Grandmother    Heart disease Paternal Grandmother    Hyperlipidemia Paternal Grandfather    Heart disease Paternal Grandfather    Colon cancer Neg Hx    Colon polyps Neg Hx    Esophageal cancer Neg Hx    Rectal cancer Neg Hx    Ulcerative colitis Neg Hx    Stomach cancer Neg Hx     Social History   Social History Narrative   Not on file     Review of  Systems General: Denies fevers, chills, weight loss CV: Denies chest pain, shortness of breath, palpitations Abdomen: Excess skin and fat on lower abdominal wall.  Currently has rashes which she states are controlled.  Physical Exam    02/12/2024   12:50 PM 02/05/2024   10:46 AM 02/05/2024   10:38 AM  Vitals with BMI  Height 5' 5.5    Weight 199 lbs 6 oz    BMI 32.67    Systolic 115 150 870  Diastolic 76 84 90  Pulse 66 54 52    General:  No acute distress,  Alert and oriented, Non-Toxic, Normal speech and affect Abdomen: Patient has a pannus which on the edges extends to the symphysis pubis but is tethered in the center and does not extend that low.  She does have obvious rashes both on the posterior aspect of the pannus and in the intertriginous regions.  This is documented in her photographs.  There are no hernias on physical examination. Mammogram: No recent mammogram results are available Assessment/Plan Panniculitis: Patient has a moderate pannus and would likely benefit from a panniculectomy.  As noted above the edges of the pannus extend to the symphysis pubis but the center is tethered in such a way that does not extend that far.  There are rashes present today and these are documented in her photos.  We discussed the procedure at length.  I showed her location of the incision we discussed the unpredictable nature of scarring and wound healing.  We discussed the risk of bleeding, infection, seroma formation.  She understands the risk of infection and wound separation are much higher and panniculectomy is due to the chronic infection in the area.  She understands that she will have 2 drains postoperatively.  That she will need to wear compressive garment for 6 weeks.  Postoperative limitations including no heavy lifting greater than 20 pounds, no vigorous activity, no submerging incisions in water  for 6 weeks.  She will be encouraged to begin ambulation immediately after surgery to help  decrease the risk of DVT PE.  She may return to light activity as tolerated.  All questions were answered to her satisfaction.  Photographs were obtained today with her consent.  Will submit her for a panniculectomy at her request. Patient is a smoker and she understands that she will need to be smoke-free for 6 weeks prior to surgery.  She will be tested at her preoperative appointment and surgery will be delayed or canceled if she is positive.  Will obtain clearance to hold the Orencia prior to surgery.   Leonce KATHEE Birmingham 02/12/2024, 1:15 PM

## 2024-03-20 ENCOUNTER — Ambulatory Visit

## 2024-03-20 ENCOUNTER — Telehealth: Payer: Self-pay | Admitting: *Deleted

## 2024-03-20 NOTE — Telephone Encounter (Signed)
 Pt did not have time to do PV and then stated that she actually needs to cancel both due to too much going on at this time. RN gave main # for pt to cal back when ready to reschedule. No other questions at this time.

## 2024-03-20 NOTE — Progress Notes (Unsigned)
 Pt's name and DOB verified at the beginning of the pre-visit with 2 identifiers  Pt denies any difficulty with ambulating,sitting, laying down or rolling side to side   Pt uses ambulation assistance device or has issues with mobiiity  Pt has no issues moving head neck or swallowing  No egg or soy allergy known to patient   No issues known to pt with past sedation with any surgeries or procedures  Patient denies ever being intubated  No FH of Malignant Hyperthermia  Pt is not on home 02   Pt is not on blood thinners   Pt denies issues with constipation   Pt has frequent issues with constipation RN instructed pt to use Miralax  per bottles instructions a week before prep days. Pt states they will  Pt is not on dialysis  Pt denise any abnormal heart rhythms   Pt denies any upcoming cardiac testing  Chart not reviewed by CRNA prior to [facility]  Visit by phone  Pt states weight is   Informed pt to come in at the time discussed and is shown on PV instructions. Pt informed that they are coming in i.e. cloths change.,IV placement , Consent signing and meeting CRNA. Pt states understanding after  given opportunity to ask questions t after all  instructions given. Instructed pt to review instructions again prior to procedure and call main # given if has any questions.. Pt states they will.  Instructed pt where to find PV instructions in My Chart

## 2024-04-03 ENCOUNTER — Encounter: Admitting: Gastroenterology

## 2024-04-06 ENCOUNTER — Emergency Department (HOSPITAL_BASED_OUTPATIENT_CLINIC_OR_DEPARTMENT_OTHER)

## 2024-04-06 ENCOUNTER — Other Ambulatory Visit: Payer: Self-pay

## 2024-04-06 ENCOUNTER — Emergency Department (HOSPITAL_BASED_OUTPATIENT_CLINIC_OR_DEPARTMENT_OTHER): Admission: EM | Admit: 2024-04-06 | Discharge: 2024-04-06 | Disposition: A | Discharge status: Home / Self Care

## 2024-04-06 DIAGNOSIS — R1013 Epigastric pain: Secondary | ICD-10-CM | POA: Diagnosis present

## 2024-04-06 DIAGNOSIS — R112 Nausea with vomiting, unspecified: Secondary | ICD-10-CM | POA: Insufficient documentation

## 2024-04-06 LAB — URINALYSIS, ROUTINE W REFLEX MICROSCOPIC
Bacteria, UA: NONE SEEN
Bilirubin Urine: NEGATIVE
Glucose, UA: NEGATIVE mg/dL
Ketones, ur: NEGATIVE mg/dL
Nitrite: NEGATIVE
Protein, ur: 30 mg/dL — AB
Specific Gravity, Urine: 1.017 (ref 1.005–1.030)
pH: 6.5 (ref 5.0–8.0)

## 2024-04-06 LAB — CBC WITH DIFFERENTIAL/PLATELET
Abs Immature Granulocytes: 0.05 K/uL (ref 0.00–0.07)
Basophils Absolute: 0 K/uL (ref 0.0–0.1)
Basophils Relative: 0 %
Eosinophils Absolute: 0 K/uL (ref 0.0–0.5)
Eosinophils Relative: 0 %
HCT: 37.3 % (ref 36.0–46.0)
Hemoglobin: 12.6 g/dL (ref 12.0–15.0)
Immature Granulocytes: 0 %
Lymphocytes Relative: 7 %
Lymphs Abs: 0.8 K/uL (ref 0.7–4.0)
MCH: 33.4 pg (ref 26.0–34.0)
MCHC: 33.8 g/dL (ref 30.0–36.0)
MCV: 98.9 fL (ref 80.0–100.0)
Monocytes Absolute: 0.4 K/uL (ref 0.1–1.0)
Monocytes Relative: 3 %
Neutro Abs: 11 K/uL — ABNORMAL HIGH (ref 1.7–7.7)
Neutrophils Relative %: 90 %
Platelets: 324 K/uL (ref 150–400)
RBC: 3.77 MIL/uL — ABNORMAL LOW (ref 3.87–5.11)
RDW: 13.2 % (ref 11.5–15.5)
WBC: 12.9 K/uL — ABNORMAL HIGH (ref 4.0–10.5)
nRBC: 0 % (ref 0.0–0.2)

## 2024-04-06 LAB — COMPREHENSIVE METABOLIC PANEL WITH GFR
ALT: 26 U/L (ref 0–44)
AST: 23 U/L (ref 15–41)
Albumin: 4.4 g/dL (ref 3.5–5.0)
Alkaline Phosphatase: 125 U/L (ref 38–126)
Anion gap: 13 (ref 5–15)
BUN: 11 mg/dL (ref 8–23)
CO2: 21 mmol/L — ABNORMAL LOW (ref 22–32)
Calcium: 9.8 mg/dL (ref 8.9–10.3)
Chloride: 101 mmol/L (ref 98–111)
Creatinine, Ser: 0.68 mg/dL (ref 0.44–1.00)
GFR, Estimated: >60 mL/min
Glucose, Bld: 135 mg/dL — ABNORMAL HIGH (ref 70–99)
Potassium: 3.7 mmol/L (ref 3.5–5.1)
Sodium: 136 mmol/L (ref 135–145)
Total Bilirubin: 0.4 mg/dL (ref 0.0–1.2)
Total Protein: 7.3 g/dL (ref 6.5–8.1)

## 2024-04-06 LAB — LIPASE, BLOOD: Lipase: 33 U/L (ref 11–51)

## 2024-04-06 MED ORDER — LIDOCAINE 5 % EX PTCH
1.0000 | MEDICATED_PATCH | CUTANEOUS | Status: DC
  Administered 2024-04-06: 1 via TRANSDERMAL
  Filled 2024-04-06: qty 1

## 2024-04-06 MED ORDER — ONDANSETRON HCL 4 MG/2ML IJ SOLN
4.0000 mg | Freq: Once | INTRAMUSCULAR | Status: AC
  Administered 2024-04-06: 4 mg via INTRAVENOUS
  Filled 2024-04-06: qty 2

## 2024-04-06 MED ORDER — IOHEXOL 300 MG/ML  SOLN
100.0000 mL | Freq: Once | INTRAMUSCULAR | Status: AC | PRN
  Administered 2024-04-06: 100 mL via INTRAVENOUS

## 2024-04-06 MED ORDER — SODIUM CHLORIDE 0.9 % IV BOLUS
1000.0000 mL | Freq: Once | INTRAVENOUS | Status: AC
  Administered 2024-04-06: 1000 mL via INTRAVENOUS

## 2024-04-06 MED ORDER — MORPHINE SULFATE (PF) 2 MG/ML IV SOLN
1.0000 mg | Freq: Once | INTRAVENOUS | Status: AC
  Administered 2024-04-06: 1 mg via INTRAVENOUS
  Filled 2024-04-06: qty 1

## 2024-04-06 NOTE — Discharge Instructions (Addendum)
 Your CT scan was unremarkable.  Do not show kind of acute complications related to your gastric bypass.   Please continue with all your medications.  Please continue the stomach medication to help with acid.  You may need to follow-up with gastroenterology if symptoms continue.  They may need to perform endoscopy if this continues.   If you notice any, blood in your stool such as dark stool or dark vomitus then please come back to the ED.  For your blood pressure, please take your blood pressure twice per day.  Write this down a piece of paper.  Take this recordings to your PCP for further blood pressure control

## 2024-04-06 NOTE — ED Notes (Signed)
 MD aware of patient's BP prior to d/c.  Patient instructed to monitor BP and follow up with PCP.  Understanding voiced

## 2024-04-06 NOTE — ED Triage Notes (Signed)
 Patient states lower back pain x1 week. Denies injury. Patient also endorses epigastric abdominal pain that began last night. Endorses nausea. Hx of gastric sleeve.

## 2024-04-06 NOTE — ED Provider Notes (Signed)
 [place] EMERGENCY DEPARTMENT AT [facility] Provider Note   CSN: [id number] Arrival date & time: 04/06/24  1013     Sherry Dennis presents with: Abdominal Pain and Back Pain   PARMINDER TRAPANI is a 63 y.o. female.    Abdominal Pain Back Pain Associated symptoms: abdominal pain       Sherry Dennis Sherry Dennis presents with abdominal pain.  Epigastric abdominal pain.  Is been present for the past couple of days.  Sherry Dennis states she has had a couple episodes of emesis during this time.  Bowel movements have been normal.  No chest pain.  No shortness of breath.  No hematemesis.  No hematochezia that she is aware of.  Still passing flatulence.  Sherry Dennis states that she takes balloon 20 mg dose of omeprazole  daily.  No previous history of any colon ulcer status post gastric sleeve.  Has had no complications with her gastric sleeve.  No sick contacts that she is aware of.  No fever no chills.   Sherry Dennis also complain about right lower back pain.  Feels like has been present for over a week.  Worse when moving around.  Has been get up and down a lot out of vehicles at work and she feels like it is associated with this.  No midline back pain.  No bowel bladder continence.  No saddle anesthesia.  Has been to walk but does hurt with ambulation in his right lower back.  No numbness or tingling in the lower extremities or groin.   Previous medical history reviewed : status post laparoscopic sleeve gastrectomy on 05/26/2019    Prior to Admission medications   Medication Sig Start Date End Date Taking? Authorizing Provider  abatacept (ORENCIA) 250 MG injection as directed Intravenous    [provider]  acetaminophen  (TYLENOL ) 650 MG CR tablet Take 650 mg by mouth 3 (three) times daily as needed for pain.    [provider]  atorvastatin  (LIPITOR) 10 MG tablet Take 1 tablet (10 mg total) by mouth daily. 03/04/23   Cranford, Tonya, NP  bisoprolol -hydrochlorothiazide  (ZIAC ) 10-6.25 MG tablet  TAKE 1 TABLET BY MOUTH EVERY MORNING FOR BLOOD PRESSURE 07/02/23   Cranford, Tonya, NP  Budeson-Glycopyrrol-Formoterol (BREZTRI  AEROSPHERE) 160-9-4.8 MCG/ACT AERO Inhale 1 Pump into the lungs in the morning and at bedtime. 01/22/20   Craig Alan SAUNDERS, PA-C  cyanocobalamin  (VITAMIN B12) 1000 MCG/ML injection INJECT 1 ML (1000MCG) ONCE WEEKLY FOR 6 WEEKS, THEN ONCE A MONTH 06/30/23   Wilkinson, Dana E, NP  cyclobenzaprine  (FLEXERIL ) 10 MG tablet Take 1 tablet (10 mg total) by mouth 3 (three) times daily as needed for muscle spasms. 04/27/21   Shuford, Randine, PA-C  doxycycline  (VIBRA -TABS) 100 MG tablet Take 100 mg by mouth 2 (two) times daily. Sherry Dennis not taking: Reported on 02/12/2024 06/12/23   [provider]  escitalopram  (LEXAPRO ) 20 MG tablet TAKE 1 TABLET BY MOUTH EVERY DAY 07/03/23   Wilkinson, Dana E, NP  famotidine (PEPCID) 20 MG tablet 1 tablet at bedtime as needed Orally Once a day for 30 day(s) 11/12/23   [provider]  fluorouracil (EFUDEX) 5 % cream Apply topically 2 (two) times daily. 01/16/24   [provider]  folic acid  (FOLVITE ) 1 MG tablet TAKE 2 TABLETS (2 MG TOTAL) BY MOUTH AT BEDTIME. 08/11/23   Wilkinson, Dana E, NP  levothyroxine  (SYNTHROID ) 137 MCG tablet TAKE 1 TABLET DAILY ON AN EMPTY STOMACH WITH ONLY WATER  FOR 30 MINUTES & NO ANTACID MEDS, CALCIUM  OR MAGNESIUM FOR  4 HOURS & AVOID BIOTIN 05/28/23   Wilkinson, Dana E, NP  meclizine  (ANTIVERT ) 25 MG tablet Take 1 tablet (25 mg total) by mouth 3 (three) times daily as needed for dizziness. 10/06/19   Long, Joshua G, MD  meloxicam (MOBIC) 15 MG tablet Take 15 mg by mouth daily. 11/22/23   [provider]  methocarbamol  (ROBAXIN ) 500 MG tablet Take 500-1,000 mg by mouth every 6 (six) hours as needed.    [provider]  methotrexate 50 MG/2ML injection Inject 15 mg into the muscle once a week. 10/05/19   [provider]  naproxen  (NAPROSYN ) 500 MG tablet Take 1 tablet (500 mg total)  by mouth 2 (two) times daily with a meal. 04/27/21   Shuford, Randine, PA-C  olmesartan  (BENICAR ) 40 MG tablet TAKE 1 TABLET BY MOUTH EVERY DAY FOR BLOOD PRESSURE 04/29/23   Cranford, Tonya, NP  ondansetron  (ZOFRAN ) 4 MG tablet Take 1 tablet (4 mg total) by mouth every 8 (eight) hours as needed for nausea or vomiting. 07/01/23   Cranford, Tonya, NP  OVER THE COUNTER MEDICATION daily at 12 noon. Stool softner    [provider]  tirzepatide (ZEPBOUND) 5 MG/0.5ML Pen 0.5 mL Subcutaneous 2 mL for 30 days 11/12/23   [provider]  tretinoin (RETIN-A) 0.025 % cream SMARTSIG:sparingly Topical Every Night 01/27/24   [provider]  triamcinolone  cream (KENALOG ) 0.1 % APPLY TWO TIMES DAILY AS NEEDED 12/23/23   [provider]  Vitamin D , Ergocalciferol , (DRISDOL ) 1.25 MG (50000 UNIT) CAPS capsule TAKE 1 CAPSULE BY MOUTH 3 DAYS A WEEK FOR VITAMIN DEFICIENCY 05/03/21   Wilkinson, Dana E, NP    Allergies: Darvon [propoxyphene hcl], Propoxyphene, Doxycycline , Hydromorphone , Oseltamivir, Other, Oxycodone -acetaminophen , and Tamiflu [oseltamivir phosphate]    Review of Systems  Gastrointestinal:  Positive for abdominal pain.  Musculoskeletal:  Positive for back pain.    Updated Vital Signs BP (!) 202/99 (BP Location: Right Arm)   Pulse 75   Temp 97.9 F (36.6 C) (Oral)   Resp 16   SpO2 100%   Physical Exam Vitals and nursing note reviewed.  Constitutional:      General: She is not in acute distress.    Appearance: She is well-developed.  HENT:     Head: Normocephalic and atraumatic.  Eyes:     Conjunctiva/sclera: Conjunctivae normal.  Cardiovascular:     Rate and Rhythm: Normal rate and regular rhythm.     Heart sounds: No murmur heard. Pulmonary:     Effort: Pulmonary effort is normal. No respiratory distress.     Breath sounds: Normal breath sounds.  Abdominal:     Palpations: Abdomen is soft.     Tenderness: There is no abdominal tenderness.    Musculoskeletal:        General: No swelling.       Arms:     Cervical back: Neck supple.  Skin:    General: Skin is warm and dry.     Capillary Refill: Capillary refill takes less than 2 seconds.  Neurological:     Mental Status: She is alert.  Psychiatric:        Mood and Affect: Mood normal.     (all labs ordered are listed, but only abnormal results are displayed) Labs Reviewed  CBC WITH DIFFERENTIAL/PLATELET - Abnormal; Notable for the following components:      Result Value   WBC 12.9 (*)    RBC 3.77 (*)    Neutro Abs 11.0 (*)  All other components within normal limits  COMPREHENSIVE METABOLIC PANEL WITH GFR - Abnormal; Notable for the following components:   CO2 21 (*)    Glucose, Bld 135 (*)    All other components within normal limits  URINALYSIS, ROUTINE W REFLEX MICROSCOPIC - Abnormal; Notable for the following components:   Hgb urine dipstick TRACE (*)    Protein, ur 30 (*)    Leukocytes,Ua TRACE (*)    All other components within normal limits  LIPASE, BLOOD    EKG: EKG Interpretation Date/Time:  Monday April 06 2024 10:54:01 EDT Ventricular Rate:  63 PR Interval:  170 QRS Duration:  110 QT Interval:  462 QTC Calculation: 473 R Axis:   -48  Text Interpretation: Sinus rhythm Incomplete left bundle branch block Confirmed by Simon Rea [phone]) on 04/06/2024 11:04:58 AM  Radiology: CT ABDOMEN PELVIS W CONTRAST Result Date: 04/06/2024 CLINICAL DATA:  Needs PO contrast. status post laparoscopic sleeve gastrectomy on 05/26/2019. Epigastric pain. EXAM: CT ABDOMEN AND PELVIS WITH CONTRAST TECHNIQUE: Multidetector CT imaging of the abdomen and pelvis was performed using the standard protocol following bolus administration of intravenous contrast. RADIATION DOSE REDUCTION: This exam was performed according to the departmental dose-optimization program which includes automated exposure control, adjustment of the mA and/or kV according to Sherry Dennis size and/or  use of iterative reconstruction technique. CONTRAST:  OMNIPAQUE  IOHEXOL  300 MG/ML  SOLN COMPARISON:  CT scan abdomen and pelvis from 09/11/2017. FINDINGS: Lower chest: Linear area of atelectasis noted in the left lung lower lobe. The lung bases are otherwise clear. No pleural effusion. The heart is normal in size. No pericardial effusion. Hepatobiliary: The liver is normal in size. Non-cirrhotic configuration. No suspicious mass. These is mild diffuse hepatic steatosis. There is a single sub 5 mm hypoattenuating focus in the central left hepatic lobe, which is too small to adequately characterize but unchanged since the prior study and favored benign in etiology. No intrahepatic or extrahepatic bile duct dilation. No calcified gallstones. Normal gallbladder wall thickness. No pericholecystic inflammatory changes. Pancreas: Unremarkable. No pancreatic ductal dilatation or surrounding inflammatory changes. Spleen: Status post splenectomy. Accessory splenule noted in the left upper quadrant. Adrenals/Urinary Tract: There is a stable 2.9 x 3.4 cm left adrenal adenoma. Unremarkable right adrenal gland. No suspicious renal mass. There is a 6 x 8 mm cyst arising from the right kidney interpolar region, laterally. There is a 1-1.5 mm nonobstructing calculus in the right kidney upper pole calyx. No other nephroureterolithiasis on either side. No obstructive uropathy on either side. Bilateral extrarenal pelvis noted. Unremarkable urinary bladder. Stomach/Bowel: There is small sliding hiatal hernia. Status post sleeve gastrectomy. No disproportionate dilation of the small or large bowel loops. No evidence of abnormal bowel wall thickening or inflammatory changes. The appendix was not visualized; however there is no acute inflammatory process in the left paramedian middle abdomen, surrounding the cecum. Vascular/Lymphatic: No ascites or pneumoperitoneum. No abdominal or pelvic lymphadenopathy, by size criteria. No  aneurysmal dilation of the major abdominal arteries. There are mild peripheral atherosclerotic vascular calcifications of the aorta and its major branches. Reproductive: The uterus is unremarkable. No large adnexal mass. Other: There is a tiny fat containing umbilical hernia. The soft tissues and abdominal wall are otherwise unremarkable. Musculoskeletal: No suspicious osseous lesions. IMPRESSION: 1. No acute inflammatory process identified within the abdomen or pelvis. No bowel obstruction. 2. Multiple other nonacute observations, as described above. Aortic Atherosclerosis (ICD10-I70.0). Electronically Signed   By: Ree Molt M.D.   On: 04/06/2024  12:28     Procedures   Medications Ordered in the ED  lidocaine  (LIDODERM ) 5 % 1 patch (1 patch Transdermal Patch Applied 04/06/24 1138)  sodium chloride  0.9 % bolus 1,000 mL (0 mLs Intravenous Stopped 04/06/24 1250)  ondansetron  (ZOFRAN ) injection 4 mg (4 mg Intravenous Given 04/06/24 1136)  morphine  (PF) 2 MG/ML injection 1 mg (1 mg Intravenous Given 04/06/24 1136)  iohexol  (OMNIPAQUE ) 300 MG/ML solution 100 mL (100 mLs Intravenous Contrast Given 04/06/24 1214)                                    Medical Decision Making Amount and/or Complexity of Data Reviewed Labs: ordered. Radiology: ordered.  Risk Prescription drug management.   Sherry Dennis Sherry Dennis presents with abdominal pain.  Epigastric abdominal pain.  Is been present for the past couple of days.  Sherry Dennis states she has had a couple episodes of emesis during this time.  Bowel movements have been normal.  No chest pain.  No shortness of breath.  No hematemesis.  No hematochezia that she is aware of.  Still passing flatulence.  Sherry Dennis states that she takes balloon 20 mg dose of omeprazole  daily.  No previous history of any colon ulcer status post gastric sleeve.  Has had no complications with her gastric sleeve.  No sick contacts that she is aware of.  No fever no chills.  Previous medical  history reviewed : status post laparoscopic sleeve gastrectomy on 05/26/2019    Sherry Dennis also complain about right lower back pain.  Feels like has been present for over a week.  Worse when moving around.  Has been get up and down a lot out of vehicles at work and she feels like it is associated with this.  No midline back pain.  No bowel bladder continence.  No saddle anesthesia.  Has been to walk but does hurt with ambulation in his right lower back.  No numbness or tingling in the lower extremities or groin.   Previous medical history reviewed : status post laparoscopic sleeve gastrectomy on 05/26/2019    In terms of back pain, feeling this is likely musculoskeletal.  Low concerns with disc herniation.  Could be muscle pull.  Could also be some pain over the sacroiliac joint area.  No bowel or bladder continence.  No saddle anesthesia.  No numbness or tingling of the legs.  No concerns in, central cord pathology such as cauda equina.  No indication for imaging.   In terms of abdominal pain, Sherry Dennis does have history of gastric sleeve 2020.  Given the nausea and vomiting, did obtain CT scan with both p.o. and IV contrast to rule out an Of obvious acute pathology including ileus, obstruction, other pathology associated with gastric sleeve.  CT scan with p.o. and IV contrast was unremarkable.  No acute pathology was seen.    Sherry Dennis received a liter of fluid here in the ED.  Also received Zofran  here in the ED.  Lidocaine  patch placed on the back.  Sherry Dennis was able to tolerate p.o.  Recommend ongoing use of PPI.  Recommend follow-up with gastroenterology if she continues to have pain.    Final diagnoses:  Epigastric pain  Nausea and vomiting, unspecified vomiting type    ED Discharge Orders     None          Simon Lavonia SAILOR, MD 04/06/24 1453

## 2024-04-17 NOTE — Progress Notes (Unsigned)
 The patient attended a screening event on 01/14/2024 where her BP screening results was 123/82, non-fasting blood glucose was 111. At the event the patient noted she has insurance and pt documented she does smoke tobacco. Patient did not have any SDOH insecurities. Pt list pcp as Dr. Izetta Cork at [facility]. Per chart review pt has a pcp and the last office visit was 04/14/2024 for essential hypertension. The pt BP was 118/74 on 04/14/2024. Per chart review pt insurance coverage is [employer]. Post event initial f/u [facility] called pt pcp office on 04/16/2024 to confirm that pt is established with pcp and was last seen on 04/14/2024. Patient pcp office also confirmed that pt has a future appt on 04/23/2024. No additional Health equity team support indicated at this time.

## 2024-06-15 ENCOUNTER — Ambulatory Visit: Admitting: Physician Assistant

## 2024-06-15 DIAGNOSIS — Z9884 Bariatric surgery status: Secondary | ICD-10-CM | POA: Diagnosis not present

## 2024-06-15 DIAGNOSIS — Z720 Tobacco use: Secondary | ICD-10-CM

## 2024-06-15 DIAGNOSIS — M793 Panniculitis, unspecified: Secondary | ICD-10-CM

## 2024-06-15 NOTE — Progress Notes (Cosign Needed Addendum)
 Patient ID: Sherry Dennis, female    DOB: Oct 22, 1960, 63 y.o.   MRN: [id number]  Chief Complaint  Patient presents with   Pre-op Exam    preop:panniculectomy      ICD-10-CM   1. Panniculitis  M79.3     2. Nicotine use  Z72.0 Nicotine/cotinine metabolites       History of Present Illness: Sherry CSASZAR is a 63 y.o.  female  with a history of bariatric surgery with subsequent extreme weight loss.  She presents for preoperative evaluation for upcoming procedure, panniculectomy, scheduled for 06/30/2024 with Dr. Waddell.  The patient has not had problems with anesthesia aside from PONV.  Recent hand surgery last year without complication.  She denies any personal or family history of blood clots or clotting disorder.  She does report history of cardiac ablation for SVT over 10 years ago and she has since been cardiac cleared and no longer reports to cardiology.  Denies any other significant cardiac history, pulmonary history, CVA, MI, varicosities, or inflammatory bowel disease.  Her most recent A1c was 6.1% 06/2023, down from 6.5%.  She will get updated records from [facility] and provide them to our office to ensure that her A1c remains controlled.  She has discontinued nicotine, but admits that it has been difficult.  Last use was approximately 4 weeks ago.  She will have the testing done today.  She was scheduled to have her Orencia injection later this afternoon, but informed patient that clearance has been obtained by rheumatology to hold 3 to 4 weeks prior to surgery.  She will skip this injection and resume 2 weeks after surgery.  As for the methotrexate listed in her records, she states that she has not taken it in a while and will also abstain from restarting perioperatively.  She will hold her NSAIDs 5 days prior to surgery and any vitamins/supplements 7 days prior to surgery.  She states that her daughter will be assisting with her postoperative recovery.  Summary of  Previous Visit: She was seen for consult 02/12/2024.  At that time, complained of large, overhanging pannus.  Discussed surgical excision via panniculectomy.  Also noted that she has already and receives Orencia infusions on a monthly basis.  She was also noted to be a smoker and will need to be smoke-free for 6 weeks prior to surgery.  Nicotine/cotinine test required at preoperative appointment.  Job: Works for the [employer] as a merchandiser, retail, discussed 2 weeks FMLA/STD.  PMH Significant for: History of bariatric surgery with the support extreme weight loss on Zepbound, HTN, HLD, OSA, T2DM most recent A1c 6.1% 07/01/2023, RA on Orencia injections as well as methotrexate, nicotine use disorder, thyroid  disorder, hand surgery 2024, BCC treated with Efudex.  Addendum: Hemoglobin A1c obtained 02/10/2024 was 5.9%, improved from 2024.  However, patient admitted that she has actually been smoking actively and will be postponed.  She understands that she needs to be nicotine free 6 weeks before and after surgery.  Past Medical History: Allergies: Allergies  Allergen Reactions   Darvon [Propoxyphene Hcl] Anaphylaxis and Other (See Comments)    Airway swelling    Propoxyphene Anaphylaxis    Throat closes up   Doxycycline  Nausea Only   Hydromorphone  Nausea Only   Oseltamivir Hives and Other (See Comments)    Hives, elevated BP   Other Nausea Only    Any narcotic med but morphine  makes pt nauseated    Oxycodone -Acetaminophen  Nausea Only  Tamiflu [Oseltamivir Phosphate] Hives and Other (See Comments)    Increased blood pressure    Current Medications:  Current Outpatient Medications:    abatacept (ORENCIA) 250 MG injection, as directed Intravenous, Disp: , Rfl:    acetaminophen  (TYLENOL ) 650 MG CR tablet, Take 650 mg by mouth 3 (three) times daily as needed for pain., Disp: , Rfl:    atorvastatin  (LIPITOR) 10 MG tablet, Take 1 tablet (10 mg total) by mouth daily., Disp: 90 tablet, Rfl: 2    Budeson-Glycopyrrol-Formoterol (BREZTRI  AEROSPHERE) 160-9-4.8 MCG/ACT AERO, Inhale 1 Pump into the lungs in the morning and at bedtime., Disp: 10.7 g, Rfl: 1   cyanocobalamin  (VITAMIN B12) 1000 MCG/ML injection, INJECT 1 ML (1000MCG) ONCE WEEKLY FOR 6 WEEKS, THEN ONCE A MONTH, Disp: 4 mL, Rfl: 7   cyclobenzaprine  (FLEXERIL ) 10 MG tablet, Take 1 tablet (10 mg total) by mouth 3 (three) times daily as needed for muscle spasms., Disp: 30 tablet, Rfl: 1   escitalopram  (LEXAPRO ) 20 MG tablet, TAKE 1 TABLET BY MOUTH EVERY DAY, Disp: 30 tablet, Rfl: 14   famotidine (PEPCID) 20 MG tablet, 1 tablet at bedtime as needed Orally Once a day for 30 day(s), Disp: , Rfl:    folic acid  (FOLVITE ) 1 MG tablet, TAKE 2 TABLETS (2 MG TOTAL) BY MOUTH AT BEDTIME., Disp: 30 tablet, Rfl: 9   levothyroxine  (SYNTHROID ) 137 MCG tablet, TAKE 1 TABLET DAILY ON AN EMPTY STOMACH WITH ONLY WATER  FOR 30 MINUTES & NO ANTACID MEDS, CALCIUM  OR MAGNESIUM FOR 4 HOURS & AVOID BIOTIN, Disp: 30 tablet, Rfl: 11   meloxicam (MOBIC) 15 MG tablet, Take 15 mg by mouth daily., Disp: , Rfl:    methocarbamol  (ROBAXIN ) 500 MG tablet, Take 500-1,000 mg by mouth every 6 (six) hours as needed., Disp: , Rfl:    methotrexate 50 MG/2ML injection, Inject 15 mg into the muscle once a week., Disp: , Rfl:    naproxen  (NAPROSYN ) 500 MG tablet, Take 1 tablet (500 mg total) by mouth 2 (two) times daily with a meal., Disp: 60 tablet, Rfl: 1   olmesartan  (BENICAR ) 40 MG tablet, TAKE 1 TABLET BY MOUTH EVERY DAY FOR BLOOD PRESSURE, Disp: 30 tablet, Rfl: 11   ondansetron  (ZOFRAN ) 4 MG tablet, Take 1 tablet (4 mg total) by mouth every 8 (eight) hours as needed for nausea or vomiting., Disp: 20 tablet, Rfl: 0   OVER THE COUNTER MEDICATION, daily at 12 noon. Stool softner, Disp: , Rfl:    tirzepatide (ZEPBOUND) 5 MG/0.5ML Pen, 0.5 mL Subcutaneous 2 mL for 30 days, Disp: , Rfl:    tretinoin (RETIN-A) 0.025 % cream, SMARTSIG:sparingly Topical Every Night, Disp: , Rfl:     Vitamin D , Ergocalciferol , (DRISDOL ) 1.25 MG (50000 UNIT) CAPS capsule, TAKE 1 CAPSULE BY MOUTH 3 DAYS A WEEK FOR VITAMIN DEFICIENCY, Disp: 12 capsule, Rfl: 5  Past Medical Problems: Past Medical History:  Diagnosis Date   Allergy    Anemia    Anxiety    Arthritis    knees, hands, ankles, ~ every joint (04/08/2015)   Basal cell carcinoma    face, hands, chest (04/08/2015)   Chronic bronchitis (HCC)    get it pretty much q yr (04/08/2015)   COPD (chronic obstructive pulmonary disease) (HCC)    Diabetes mellitus without complication (HCC)    Dysrhythmia    palpitations occ; SVT 09/2013 s/p adenosine    GERD (gastroesophageal reflux disease)    H/O hiatal hernia    Hx of cardiovascular stress test  Lexiscan  Myoview 6/16: Ejection fraction is 60% and wall motion is normal. The study is normal. There is no scar or ischemia. This is a low risk scan.   Hyperlipidemia    Hypertension    Hypothyroidism    Osteoporosis    Palpitations    PONV (postoperative nausea and vomiting)    S/P total knee arthroplasty 02/15/2014   Sleep apnea    uses CPAP nightly   Thyroid  disease    Vitamin D  deficiency     Past Surgical History: Past Surgical History:  Procedure Laterality Date   BASAL CELL CARCINOMA EXCISION  2014   off my chest   BLADDER REPAIR  1993   lasered the holes shut   CARDIAC CATHETERIZATION  2017   CARDIAC SURGERY     CARPAL TUNNEL RELEASE Right 1990   CLOSED REDUCTION FINGER WITH PERCUTANEOUS PINNING Right 03/28/2023   Procedure: CLOSED REDUCTION PERCUTANEOUS PINNING RIGHT SMALL FINGER PROXIMAL PHALANX FRACTURE AND CLOSED REDUCTION PERCUTANEOUS PINNING RIGHT RING FINGER PROXIMAL PHALANX AND RIGHT SMALL METACARPAL HEAD;  Surgeon: Murrell Drivers, MD;  Location: [facility];  Service: Orthopedics;  Laterality: Right;   COLONOSCOPY     COLONOSCOPY WITH PROPOFOL  N/A 11/25/2017   Procedure: COLONOSCOPY WITH PROPOFOL ;  Surgeon: Shila Gustav GAILS, MD;  Location:  [facility];  Service: Endoscopy;  Laterality: N/A;   ELECTROPHYSIOLOGIC STUDY N/A 04/08/2015   Procedure: SVT Ablation;  Surgeon: Danelle LELON Birmingham, MD;  Location: [facility] INVASIVE CV LAB;  Service: Cardiovascular;  Laterality: N/A;   ENDOMETRIAL ABLATION  2009   HARDWARE REMOVAL Right 05/14/2023   Procedure: REMOVAL RETAINED K-WIRES RIGHT HAND;  Surgeon: Murrell Drivers, MD;  Location: [facility];  Service: Orthopedics;  Laterality: Right;  30 MIN   INCONTINENCE SURGERY  2010   JOINT REPLACEMENT     KNEE ARTHROSCOPY Right 2013   KNEE ARTHROSCOPY     LAPAROSCOPIC GASTRIC SLEEVE RESECTION N/A 05/26/2019   Procedure: LAPAROSCOPIC GASTRIC SLEEVE RESECTION, Upper Endo, ERAS Pathway;  Surgeon: Tanda Locus, MD;  Location: [facility];  Service: General;  Laterality: N/A;   PARTIAL KNEE ARTHROPLASTY Right 02/15/2014   Procedure: RIGHT UNICOMPARTMENTAL KNEE (MEDIAL COMPARTMENT);  Surgeon: Marcey Raman, MD;  Location: [facility];  Service: Orthopedics;  Laterality: Right;   POLYPECTOMY     REVERSE SHOULDER ARTHROPLASTY Right 04/27/2021   Procedure: REVERSE SHOULDER ARTHROPLASTY;  Surgeon: Melita Drivers, MD;  Location: [facility];  Service: Orthopedics;  Laterality: Right;    SPLENECTOMY, TOTAL  05/26/2019   Procedure: SPLENECTOMY;  Surgeon: Tanda Locus, MD;  Location: [facility];  Service: General;;   TUBAL LIGATION  1987    Social History: Social History   Socioeconomic History   Marital status: Divorced    Spouse name: Not on file   Number of children: 2   Years of education: Not on file   Highest education level: Not on file  Occupational History   Not on file  Tobacco Use   Smoking status: Every Day    Current packs/day: 1.50    Average packs/day: 1.5 packs/day for 30.0 years (45.0 ttl pk-yrs)    Types: Cigarettes   Smokeless tobacco: Never  Vaping Use   Vaping status: Never Used  Substance and Sexual Activity   Alcohol use: No   Drug use: Yes    Types: Marijuana    Comment: last used  March 2025   Sexual activity: Not Currently    Birth control/protection: Post-menopausal, Surgical    Comment: ablation  Other Topics Concern   Not on file  Social History Narrative   Not on file   Social Drivers of Health   Financial Resource Strain: Not on file  Food Insecurity: No Food Insecurity (01/14/2024)   Hunger Vital Sign    Worried About Running Out of Food in the Last Year: Never true    Ran Out of Food in the Last Year: Never true  Transportation Needs: No Transportation Needs (01/14/2024)   PRAPARE - Administrator, Civil Service (Medical): No    Lack of Transportation (Non-Medical): No  Physical Activity: Not on file  Stress: Not on file  Social Connections: Unknown (12/25/2021)   Received [facility]   Social Network    Social Network: Not on file  Intimate Partner Violence: Not At Risk (01/14/2024)   Humiliation, Afraid, Rape, and Kick questionnaire    Fear of Current or Ex-Partner: No    Emotionally Abused: No    Physically Abused: No    Sexually Abused: No    Family History: Family History  Problem Relation Age of Onset   Hypertension Mother    Atrial fibrillation Mother    Diabetes Father    Emphysema Father    COPD Father    Breast cancer Paternal Aunt        x 4 aunts,    Stroke Maternal Grandmother    Heart attack Maternal Grandmother    Hyperlipidemia Maternal Grandfather    Heart disease Maternal Grandfather    Hyperlipidemia Paternal Grandmother    Heart disease Paternal Grandmother    Hyperlipidemia Paternal Grandfather    Heart disease Paternal Grandfather    Colon cancer Neg Hx    Colon polyps Neg Hx    Esophageal cancer Neg Hx    Rectal cancer Neg Hx    Ulcerative colitis Neg Hx    Stomach cancer Neg Hx     Review of Systems: ROS Denies any recent chest pain, difficulty breathing, fevers, trauma.  Physical Exam: Vital Signs There were no vitals taken for this visit.  Physical Exam Constitutional:       General: Not in acute distress.    Appearance: Normal appearance. Not ill-appearing.  HENT:     Head: Normocephalic and atraumatic.  Eyes:     Pupils: Pupils are equal, round. Cardiovascular:     Rate and Rhythm: Normal rate.    Pulses: Normal pulses.  Pulmonary:     Effort: No respiratory distress or increased work of breathing.  Speaks in full sentences. Abdominal:     General: Abdomen is flat. No distension.   Musculoskeletal: Normal range of motion.  Scattered spider veins, but no varicosities. Skin:    General: Skin is warm and dry.     Findings: No erythema or rash.  Neurological:     Mental Status: Alert and oriented to person, place, and time.  Psychiatric:        Mood and Affect: Mood normal.        Behavior: Behavior normal.    Assessment/Plan: The patient is scheduled for panniculectomy with Dr. Waddell.  Risks, benefits, and alternatives of procedure discussed, questions answered and consent obtained.    Smoking Status: Former smoker, quit 4 weeks ago; Counseling Given?  Discussed ongoing nicotine cessation and she understands the risks of impaired wound healing and infection with any nicotine use.  Caprini Score: 5; Risk Factors include: Age, BMI greater than 25, and length of planned surgery. Recommendation for mechanical prophylaxis. Encourage  early ambulation.   Pictures obtained: At consult.  Post-op Rx sent to pharmacy: Tramadol  50 mg (she does not want oxycodone ), Zofran .  Will hold off on prescribing these medications until her nicotine test results.  Patient was provided with the General Surgical Risk consent document and Pain Medication Agreement prior to their appointment.  They had adequate time to read through the risk consent documents and Pain Medication Agreement. We also discussed them in person together during this preop appointment. All of their questions were answered to their satisfaction.  Recommended calling if they have any further questions.   Risk consent form and Pain Medication Agreement to be scanned into patient's chart.  The risk that can be encountered for this procedure were discussed and include the following but not limited to these: asymmetry, fluid accumulation, firmness of the tissue, skin loss, decrease or no sensation, fat necrosis, bleeding, infection, healing delay.  Deep vein thrombosis, cardiac and pulmonary complications are risks to any procedure.  There are risks of anesthesia, changes to skin sensation and injury to nerves or blood vessels.  The muscle can be temporarily or permanently injured.  You may have an allergic reaction to tape, suture, glue, blood products which can result in skin discoloration, swelling, pain, skin lesions, poor healing.  Any of these can lead to the need for revisonal surgery or stage procedures.  Weight gain and weigh loss can also effect the long term appearance. The results are not guaranteed to last a lifetime.  Future surgery may be required.      Electronically signed by: Honora Seip, PA-C 06/15/2024 12:12 PM

## 2024-06-19 LAB — NICOTINE/COTININE METABOLITES
Cotinine: 16.5 ng/mL
Nicotine: <1 ng/mL

## 2024-06-22 ENCOUNTER — Telehealth: Payer: Self-pay | Admitting: Plastic Surgery

## 2024-06-22 NOTE — Telephone Encounter (Signed)
 Patient called to follow up on pre-op done on 06/15/24. She said that she wasn't sure what the plan was because of the nicotine levels that showed up. She said it was a nicotine patch that caused those levels. She didn't know if the surgery was still going to happen or not. She also didn't know if we had received the report and said if she needs to bring it back she can. Call back  [phone].

## 2024-06-23 ENCOUNTER — Telehealth: Admitting: Physician Assistant

## 2024-06-23 DIAGNOSIS — M793 Panniculitis, unspecified: Secondary | ICD-10-CM

## 2024-06-23 NOTE — Telephone Encounter (Signed)
 Patient had been scheduled for panniculectomy on 06/30/2024, but it was postponed after she reported nicotine use while dropping off her A1c results.  She tells me today that it was a nicotine patch.  She understands that nicotine of any capacity, including patches/gums, will need to be held 6 weeks prior to surgery at absolute minimum and throughout the recovery.  Additionally, she has an appointment with neurology on 06/25/2024 for frequent falls.  Will benefit from clearance from them prior to an elective surgery.  Will message the surgical schedulers about getting her back on the calendar when she is at least 6 weeks from any nicotine use.

## 2024-06-25 ENCOUNTER — Encounter: Payer: Self-pay | Admitting: Neurology

## 2024-06-25 ENCOUNTER — Ambulatory Visit: Admitting: Neurology

## 2024-06-25 VITALS — BP 109/72 | HR 89 | Ht 65.0 in | Wt 187.2 lb

## 2024-06-25 DIAGNOSIS — I700 Atherosclerosis of aorta: Secondary | ICD-10-CM

## 2024-06-25 DIAGNOSIS — M5030 Other cervical disc degeneration, unspecified cervical region: Secondary | ICD-10-CM

## 2024-06-25 DIAGNOSIS — G4736 Sleep related hypoventilation in conditions classified elsewhere: Secondary | ICD-10-CM

## 2024-06-25 DIAGNOSIS — M05711 Rheumatoid arthritis with rheumatoid factor of right shoulder without organ or systems involvement: Secondary | ICD-10-CM

## 2024-06-25 DIAGNOSIS — I679 Cerebrovascular disease, unspecified: Secondary | ICD-10-CM

## 2024-06-25 DIAGNOSIS — H8112 Benign paroxysmal vertigo, left ear: Secondary | ICD-10-CM

## 2024-06-25 DIAGNOSIS — J439 Emphysema, unspecified: Secondary | ICD-10-CM

## 2024-06-25 DIAGNOSIS — R42 Dizziness and giddiness: Secondary | ICD-10-CM

## 2024-06-25 MED ORDER — ALPRAZOLAM 1 MG PO TABS: 1.0000 mg | ORAL_TABLET | Freq: Every evening | ORAL | 0 refills | Status: AC | PRN

## 2024-06-25 NOTE — Progress Notes (Signed)
 [facility]  Provider:  Dr Nolia Tschantz Referring Provider: Corlis Pagan, NP Primary Care Physician:  Corlis Pagan, NP  Chief Complaint  Patient presents with   New Patient (Initial Visit)    Pt in 1 alone Pt states falling a lot ,muscle aches, and brain fog     HPI:  Sherry Dennis is a 63 y.o. female and seen here on 06/25/2024 upon referral from NP . Corlis for a Consultation/ Evaluation of dizziness, clumsiness and falls.  Her head spinning and she is clumsy and tends to fall often. In August 2024 she fell and broke the right  wrist - her dominant hand. She has fallen at work. She has difficultly with buttoning her shirts but no headaches no nausea and no SOB.  Every day she has a feeling of poor balance.  She stumble s over her toes, may shuffle.    She reports easily feeling blinded, there is retro-orbital pressure and photophobia.   She lost 91 pounds after gastric sleeve surgery, peak weight of 275.  Her knees stopped hurting and her breathing is unlabored.  Her spleen was injured in the procedure and had to be removed.  She feels her right side is the mostly affected and  She is pending tummy tuck surgery. - her surgeon wants neuro clearance.      This patient reports onset of balance problem over a period of 6-7 years : see below.  02-22-2017: Sherry Dennis is a 63 y.o. female , seen here as in a referral/ revisit  from Dr. Tonita for an evaluation of COPD overlap with possible OSA in this active smoker.   Sherry Dennis reports that she was just recently diagnosed with rheumatoid arthritis, has irritable bowel syndrome, she has prediabetes, vitamin D  deficiency and a low thyroid -stimulating hormone she also is considered morbidly obese at a BMI of over 40 but she has recently developed more fatigue and daytime excessive daytime sleepiness more frequently waking up from sleep and has been told that she snores. In the morning she feels like she doesn't get enough sleep is not  restored or refreshed. In addition she has had some ankle edema and swelling in her feet and legs shortness of breath, wheezing alongside with snoring. Her muscles ache and her joints hurt which makes it sometimes harder to find a restful position to sleep.  She also had a partial knee replacement in 2016 and a heart ablation in 2016 for  Wolff-Parkinson-White with tachycardia and palpitations.    Sleep habits are as follows: Sherry Dennis has an established bedtime at about 10 PM but she may not immediately go to sleep. Her average sleep time will be between 3 and 4 hours at night only. She has up to 4 or 5 bathroom breaks each night, fragmenting her sleep. She may stay asleep for about one hour at a time and is in the habit of looking at the clock when she wakes up.   Sleep medical history and family sleep history: has COPD, coughing and wheezing - The patient has developed insomnia over the last 10 years, attributed to hyperthyroidism . Her TSH is low. She struggled with thyroid  disease 20 years and developed fatigue and a weight problem. She sees Dr. Dow on [facility] on [address] after she developed swollen wrists and hands-  she tested negative for gout but high for the rheumatoid factor. She is now referred for [facility] rheumatology, Dr. Lynwood Ramsay,    Social history:  active smoker 1.5 ppd, since 1990- ETOH- rare- none in 2-3 years. Caffeine : 1 cup in AM , 1 tea in afternoon, lunch.  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 She works for the [employer]. Not exercising - she is pain and she has achilles tendonitis  Review of Systems: Out of a complete 14 system review, the patient complains of only the following symptoms, and all other reviewed systems are negative.  Vertigo left side.  Social History    Socioeconomic History   Marital status: Divorced    Spouse name: Not on file   Number of children: 2   Years of education: Not on file   Highest education level: Not on file  Occupational History   Not on file  Tobacco Use   Smoking status: Every Day    Current packs/day: 1.50    Average packs/day: 1.5 packs/day for 30.0 years (45.0 ttl pk-yrs)    Types: Cigarettes   Smokeless tobacco: Never  Vaping Use   Vaping status: Never Used  Substance and Sexual Activity   Alcohol use: No   Drug use: Yes    Types: Marijuana    Comment: last used March 2025   Sexual activity: Not Currently    Birth control/protection: Post-menopausal, Surgical    Comment: ablation  Other Topics Concern   Not on file  Social History Narrative   Pt lives alone    Pt works    Social Drivers of Corporate Investment Banker Strain: Not on file  Food Insecurity: No Food Insecurity (01/14/2024)   Hunger Vital Sign    Worried About Running Out of Food in the Last Year: Never true    Ran Out of Food in the Last Year: Never true  Transportation Needs: No Transportation Needs (01/14/2024)   PRAPARE - Administrator, Civil Service (Medical): No    Lack of Transportation (Non-Medical): No  Physical Activity: Not on file  Stress: Not on file  Social Connections: Not on file  Intimate Partner Violence: Not At Risk (01/14/2024)   Humiliation, Afraid, Rape, and Kick questionnaire    Fear of Current or Ex-Partner: No    Emotionally Abused: No    Physically Abused: No    Sexually Abused: No    Family History  Problem Relation Age of Onset   Hypertension Mother    Atrial fibrillation Mother    Diabetes Father    Emphysema Father    COPD Father    Breast cancer Paternal Aunt        x 4 aunts,    Stroke Maternal Grandmother    Heart attack Maternal Grandmother    Hyperlipidemia Maternal Grandfather    Heart disease Maternal Grandfather    Hyperlipidemia Paternal Grandmother    Heart disease  Paternal Grandmother    Hyperlipidemia Paternal Grandfather    Heart disease Paternal Grandfather    Colon cancer Neg Hx    Colon polyps Neg Hx    Esophageal cancer Neg Hx    Rectal cancer Neg Hx    Ulcerative colitis Neg Hx    Stomach cancer Neg Hx     Past Medical History:  Diagnosis Date   Allergy    Anemia    Anxiety    Arthritis    knees, hands, ankles, ~ every joint (  04/08/2015)   Basal cell carcinoma    face, hands, chest (04/08/2015)   Chronic bronchitis (HCC)    get it pretty much q yr (04/08/2015)   COPD (chronic obstructive pulmonary disease) (HCC)    Diabetes mellitus without complication (HCC)    Dysrhythmia    palpitations occ; SVT 09/2013 s/p adenosine    GERD (gastroesophageal reflux disease)    H/O hiatal hernia    Hx of cardiovascular stress test    Lexiscan  Myoview 6/16: Ejection fraction is 60% and wall motion is normal. The study is normal. There is no scar or ischemia. This is a low risk scan.   Hyperlipidemia    Hypertension    Hypothyroidism    Osteoporosis    Palpitations    PONV (postoperative nausea and vomiting)    S/P total knee arthroplasty 02/15/2014   Sleep apnea    uses CPAP nightly   Thyroid  disease    Vitamin D  deficiency     Past Surgical History:  Procedure Laterality Date   BASAL CELL CARCINOMA EXCISION  2014   off my chest   BLADDER REPAIR  1993   lasered the holes shut   CARDIAC CATHETERIZATION  2017   CARDIAC SURGERY     CARPAL TUNNEL RELEASE Right 1990   CLOSED REDUCTION FINGER WITH PERCUTANEOUS PINNING Right 03/28/2023   Procedure: CLOSED REDUCTION PERCUTANEOUS PINNING RIGHT SMALL FINGER PROXIMAL PHALANX FRACTURE AND CLOSED REDUCTION PERCUTANEOUS PINNING RIGHT RING FINGER PROXIMAL PHALANX AND RIGHT SMALL METACARPAL HEAD;  Surgeon: Murrell Drivers, MD;  Location: [facility];  Service: Orthopedics;  Laterality: Right;   COLONOSCOPY     COLONOSCOPY WITH PROPOFOL  N/A 11/25/2017   Procedure: COLONOSCOPY WITH  PROPOFOL ;  Surgeon: Shila Gustav GAILS, MD;  Location: [facility];  Service: Endoscopy;  Laterality: N/A;   ELECTROPHYSIOLOGIC STUDY N/A 04/08/2015   Procedure: SVT Ablation;  Surgeon: Danelle LELON Birmingham, MD;  Location: [facility] INVASIVE CV LAB;  Service: Cardiovascular;  Laterality: N/A;   ENDOMETRIAL ABLATION  2009   HARDWARE REMOVAL Right 05/14/2023   Procedure: REMOVAL RETAINED K-WIRES RIGHT HAND;  Surgeon: Murrell Drivers, MD;  Location: [facility];  Service: Orthopedics;  Laterality: Right;  30 MIN   INCONTINENCE SURGERY  2010   JOINT REPLACEMENT     KNEE ARTHROSCOPY Right 2013   KNEE ARTHROSCOPY     LAPAROSCOPIC GASTRIC SLEEVE RESECTION N/A 05/26/2019   Procedure: LAPAROSCOPIC GASTRIC SLEEVE RESECTION, Upper Endo, ERAS Pathway;  Surgeon: Tanda Locus, MD;  Location: [facility];  Service: General;  Laterality: N/A;   PARTIAL KNEE ARTHROPLASTY Right 02/15/2014   Procedure: RIGHT UNICOMPARTMENTAL KNEE (MEDIAL COMPARTMENT);  Surgeon: Marcey Raman, MD;  Location: [facility];  Service: Orthopedics;  Laterality: Right;   POLYPECTOMY     REVERSE SHOULDER ARTHROPLASTY Right 04/27/2021   Procedure: REVERSE SHOULDER ARTHROPLASTY;  Surgeon: Melita Drivers, MD;  Location: [facility];  Service: Orthopedics;  Laterality: Right;    SPLENECTOMY, TOTAL  05/26/2019   Procedure: SPLENECTOMY;  Surgeon: Tanda Locus, MD;  Location: [facility];  Service: General;;   TUBAL LIGATION  1987    Current Outpatient Medications  Medication Sig Dispense Refill   abatacept (ORENCIA) 250 MG injection as directed Intravenous     acetaminophen  (TYLENOL ) 650 MG CR tablet Take 650 mg by mouth 3 (three) times daily as needed for pain.     atorvastatin  (LIPITOR) 10 MG tablet Take 1 tablet (10 mg total) by mouth daily. 90 tablet 2   Budeson-Glycopyrrol-Formoterol (BREZTRI  AEROSPHERE) 160-9-4.8 MCG/ACT  AERO Inhale 1 Pump into the lungs in the morning and at bedtime. 10.7 g 1   cyanocobalamin  (VITAMIN B12) 1000 MCG/ML injection INJECT  1 ML ( ) ONCE WEEKLY FOR 6 WEEKS, THEN ONCE A MONTH 4 mL 7   cyclobenzaprine  (FLEXERIL ) 10 MG tablet Take 1 tablet (10 mg total) by mouth 3 (three) times daily as needed for muscle spasms. 30 tablet 1   escitalopram  (LEXAPRO ) 20 MG tablet TAKE 1 TABLET BY MOUTH EVERY DAY 30 tablet 14   famotidine (PEPCID) 20 MG tablet 1 tablet at bedtime as needed Orally Once a day for 30 day(s)     folic acid  (FOLVITE ) 1 MG tablet TAKE 2 TABLETS (2 MG TOTAL) BY MOUTH AT BEDTIME. 30 tablet 9   levothyroxine  (SYNTHROID ) 137 MCG tablet TAKE 1 TABLET DAILY ON AN EMPTY STOMACH WITH ONLY WATER  FOR 30 MINUTES & NO ANTACID MEDS, CALCIUM  OR MAGNESIUM FOR 4 HOURS & AVOID BIOTIN 30 tablet 11   meloxicam (MOBIC) 15 MG tablet Take 15 mg by mouth daily.     methocarbamol  (ROBAXIN ) 500 MG tablet Take 500-1,000 mg by mouth every 6 (six) hours as needed.     methotrexate 50 MG/2ML injection Inject 15 mg into the muscle once a week.     naproxen  (NAPROSYN ) 500 MG tablet Take 1 tablet (500 mg total) by mouth 2 (two) times daily with a meal. 60 tablet 1   olmesartan  (BENICAR ) 40 MG tablet TAKE 1 TABLET BY MOUTH EVERY DAY FOR BLOOD PRESSURE 30 tablet 11   ondansetron  (ZOFRAN ) 4 MG tablet Take 1 tablet (4 mg total) by mouth every 8 (eight) hours as needed for nausea or vomiting. 20 tablet 0   OVER THE COUNTER MEDICATION daily at 12 noon. Stool softner     tirzepatide (ZEPBOUND) 5 MG/0.5ML Pen 0.5 mL Subcutaneous 2 mL for 30 days     tretinoin (RETIN-A) 0.025 % cream SMARTSIG:sparingly Topical Every Night     Vitamin D , Ergocalciferol , (DRISDOL ) 1.25 MG (50000 UNIT) CAPS capsule TAKE 1 CAPSULE BY MOUTH 3 DAYS A WEEK FOR VITAMIN DEFICIENCY 12 capsule 5   No current facility-administered medications for this visit.    Allergies as of 06/25/2024 - Review Complete 06/25/2024  Allergen Reaction Noted   Darvon [propoxyphene hcl] Anaphylaxis and Other (See Comments) 12/21/2011   Propoxyphene Anaphylaxis 07/21/2015   Doxycycline   Nausea Only 08/30/2017   Hydromorphone  Nausea Only 03/21/2023   Oseltamivir Hives and Other (See Comments) 07/21/2015   Other Nausea Only 04/10/2021   Oxycodone -acetaminophen  Nausea Only 03/21/2023   Tamiflu [oseltamivir phosphate] Hives and Other (See Comments) 12/21/2011    Vitals: BP 109/72   Pulse 89   Ht 5' 5 (1.651 m)   Wt 187 lb 3.2 oz (84.9 kg)   BMI 31.15 kg/m        Physical exam:  General: The patient is awake, alert and appears not in acute distress.  The patient is well groomed. Head: Normocephalic, atraumatic.  Neck is supple.  Neck circumference:14. 75 Cardiovascular:  Regular rate and palpable peripheral pulse:  Respiratory: clear to auscultation.  Mallampati2 -3 ,  Skin:  With evidence of edema, petechiae.  BMI 31. 15    Neurologic exam : The patient is awake and alert, oriented to place and time.   Memory subjective  described as intact.  There is a normal attention span & concentration ability.  Speech is fluent without  dysarthria, dysphonia or aphasia.  Mood and affect are appropriate.  Cranial nerves: Pupils  are equal and briskly reactive to light.  Dry eyes noted  Funduscopic exam evidence of pallor or edema.  Extraocular movements  in vertical and horizontal planes intact and without nystagmus.  Rapid head movements with closed eyes  cause the patient to have nystagmus with gaze to the left , 3 -4 beats and she reports feeling drifting to the left and getting nauseated   Visual fields by finger perimetry are intact. Hearing to finger rub intact.   Facial sensation intact to fine touch. Facial motor strength is symmetric and tongue and uvula move midline.  Motor exam:   Normal tone , muscle bulk. She provided less strength with left side knee flexion and right shoulder ROM.  SABRA Grip Strength equal  Proximal strength of shoulder muscles and hip flexors was 4/ 5 .  Sensory:  Fine touch and vibration were normal ( edema may have reduced  response a little bit )  Proprioception was tested in the upper extremities only and was  normal.  Coordination: Rapid alternating movements in the fingers/hands were normal.  Finger-to-nose maneuver  with bilateral  ataxia,  and dysmetria ,  no tremor.  Gait and station: Patient walked with assistive device . Rheumatoid arthritis has affected joints, shoulder was replaced.   Core Strength within normal limits.  Stance is stable and of wide/ normal. Base.  Tandem gait is cautious but possible , turns with 3 Steps are unfragmented.  Romberg testing : swaying to the left   Deep tendon reflexes: in the  upper and lower extremities are symmetric and  brisk without Clonus. Babinski maneuver response is  downgoing.   Assessment: Total time for face to face interview and examination, for review of  images and laboratory testing, neurophysiology testing and pre-existing records, including out-of -network , was 45 minutes. Assessment is as follows here:  1)    Vertigo with left side onset, when sleeping on the left or turning rapidly to the left _ BPV.   2)   Gait was intact. Romberg was positive , drifting to the left.   3) reviewed B12, Vit D and metabolic panel, TSH -  looking  good.    Plan:  Treatment plan and additional workup planned after today includes:   1)  Vestibular rehab. Eppley maneuver. Sent to [facility] off [address].   2) MRI Brain , MRA brain- ordered with xanax  for claustrophobia/ I hope her significant weight loss will make it easier.  I need to rule out cerebrovascular disease.   3) please talk to your rheumatologist about myalgia.    RV prn if any tests give us  additional need to follow up .      The patient's condition requires frequent monitoring and adjustments in the treatment plan, reflecting the ongoing complexity of care.  This provider is the continuing focal point for all needed services for this condition.   Dedra Gores, MD  [facility] Board certified by The [employer] of Sleep Medicine and Diplomate of the [employer] of Sleep Medicine. Board certified In Neurology through the ABPN, Fellow of the [employer] of Neu[employer].

## 2024-06-25 NOTE — Patient Instructions (Addendum)
 Vertigo   Assessment: Total time for face to face interview and examination, for review of  images and laboratory testing, neurophysiology testing and pre-existing records, including out-of -network , was 45 minutes. Assessment is as follows here:   1)    Vertigo with left side onset, when sleeping on the left or turning rapidly to the left _ BPV.    2)   Gait was intact. Romberg was positive , drifting to the left.    3) reviewed B12, Vit D and metabolic panel, TSH -  looking  good.      Plan:  Treatment plan and additional workup planned after today includes:    1)  Vestibular rehab. Eppley maneuver. Sent to [facility] off [address].    2) MRI Brain , MRA brain- ordered with xanax  for claustrophobia/ I hope her significant weight loss will make it easier.  I need to rule out cerebrovascular disease. Labs ordered, CMEt, CBC , CKMB, SPEP.    3) please talk to your rheumatologist about myalgia.      RV prn if any tests give us  additional need to follow up .     How to Perform the Epley Maneuver The Epley maneuver is an exercise that relieves symptoms of vertigo. Vertigo is the feeling that you or your surroundings are moving when they are not. When you feel vertigo, you may feel like the room is spinning and may have trouble walking. The Epley maneuver is used for a type of vertigo caused by a calcium  deposit in a part of the inner ear. The maneuver involves changing head positions to help the deposit move out of the area. You can do this maneuver at home whenever you have symptoms of vertigo. You can repeat it in 24 hours if your vertigo has not gone away. Even though the Epley maneuver may relieve your vertigo for a few weeks, it is possible that your symptoms will return. This maneuver relieves vertigo, but it does not relieve dizziness. What are the risks? If it is done correctly, the Epley maneuver is considered safe. Sometimes it can lead to dizziness or nausea that goes away  after a short time. If you develop other symptoms--such as changes in vision, weakness, or numbness--stop doing the maneuver and call your health care provider. Supplies needed: A bed or table. A pillow. How to do the Epley maneuver     Sit on the edge of a bed or table with your back straight and your legs extended or hanging over the edge of the bed or table. Turn your head halfway toward the affected ear or side as told by your health care provider. Lie backward quickly with your head turned until you are lying flat on your back. Your head should dangle (head-hanging position). You may want to position a pillow under your shoulders. Hold this position for at least 30 seconds. If you feel dizzy or have symptoms of vertigo, continue to hold the position until the symptoms stop. Turn your head to the opposite direction until your unaffected ear is facing down. Your head should continue to dangle. Hold this position for at least 30 seconds. If you feel dizzy or have symptoms of vertigo, continue to hold the position until the symptoms stop. Turn your whole body to the same side as your head so that you are positioned on your side. Your head will now be nearly facedown and no longer needs to dangle. Hold for at least 30 seconds. If you  feel dizzy or have symptoms of vertigo, continue to hold the position until the symptoms stop. Sit back up. You can repeat the maneuver in 24 hours if your vertigo does not go away. Follow these instructions at home: For 24 hours after doing the Epley maneuver: Keep your head in an upright position. When lying down to sleep or rest, keep your head raised (elevated) with two or more pillows. Avoid excessive neck movements. Activity Do not drive or use machinery if you feel dizzy. After doing the Epley maneuver, return to your normal activities as told by your health care provider. Ask your health care provider what activities are safe for you. General  instructions Drink enough fluid to keep your urine pale yellow. Do not drink alcohol. Take over-the-counter and prescription medicines only as told by your health care provider. Keep all follow-up visits. This is important. Preventing vertigo symptoms Ask your health care provider if there is anything you should do at home to prevent vertigo. He or she may recommend that you: Keep your head elevated with two or more pillows while you sleep. Do not sleep on the side of your affected ear. Get up slowly from bed. Avoid sudden movements during the day. Avoid extreme head positions or movement, such as looking up or bending over. Contact a health care provider if: Your vertigo gets worse. You have other symptoms, including: Nausea. Vomiting. Headache. Get help right away if you: Have vision changes. Have a headache or neck pain that is severe or getting worse. Cannot stop vomiting. Have new numbness or weakness in any part of your body. These symptoms may represent a serious problem that is an emergency. Do not wait to see if the symptoms will go away. Get medical help right away. Call your local emergency services (911 in the [place] Do not drive yourself to the hospital. Summary Vertigo is the feeling that you or your surroundings are moving when they are not. The Epley maneuver is an exercise that relieves symptoms of vertigo. If the Epley maneuver is done correctly, it is considered safe. This information is not intended to replace advice given to you by your health care provider. Make sure you discuss any questions you have with your health care provider. Document Revised: 04/26/2023 Document Reviewed: 04/26/2023 Elsevier Patient Education  2024 [employer]Dizziness Dizziness is a common problem. It makes you feel unsteady or light-headed. You may feel like you're about to faint. Dizziness can lead to getting hurt if you stumble or fall. It's more common to feel dizzy if you're an  older adult. Many things can cause you to feel dizzy. These include: Medicines. Dehydration. This is when there's not enough water  in your body. Illness. Follow these instructions at home: Eating and drinking  Drink enough fluid to keep your pee (urine) pale yellow. This helps keep you from getting dehydrated. Try to drink more clear fluids, such as water . Do not drink alcohol. Try to limit how much caffeine you take in. Try to limit how much salt, also called sodium, you take in. Activity Try not to make quick movements. Stand up slowly from sitting in a chair. Steady yourself until you feel okay. In the morning, first sit up on the side of the bed. When you feel okay, hold onto something and slowly stand up. Do this until you know that your balance is okay. If you need to stand in one place for a long time, move your legs often. Tighten and relax the muscles  in your legs while you're standing. Do not drive or use machines if you feel dizzy. Avoid bending down if you feel dizzy. Place items in your home so you can reach them without leaning over. Lifestyle Do not smoke, vape, or use products with nicotine or tobacco in them. If you need help quitting, talk with your health care provider. Try to lower your stress level. You can do this by using methods like yoga or meditation. Talk with your provider if you need help. General instructions Watch your dizziness for any changes. Take your medicines only as told by your provider. Talk with your provider if you think you're dizzy because of a medicine you're taking. Tell a friend or a family member that you're feeling dizzy. If they spot any changes in your behavior, have them call your provider. Contact a health care provider if: Your dizziness doesn't go away, or you have new symptoms. Your dizziness gets worse. You feel like you may vomit. You have trouble hearing. You have a fever. You have neck pain or a stiff neck. You fall or get  hurt. Get help right away if: You vomit each time you eat or drink. You have watery poop and can't eat or drink. You have trouble talking, walking, swallowing, or using your arms, hands, or legs. You feel very weak. You're bleeding. You're not thinking clearly, or you have trouble forming sentences. A friend or family member may spot this. Your vision changes, or you get a very bad headache. These symptoms may be an emergency. Call 911 right away. Do not wait to see if the symptoms will go away. Do not drive yourself to the hospital. This information is not intended to replace advice given to you by your health care provider. Make sure you discuss any questions you have with your health care provider. Document Revised: 05/02/2023 Document Reviewed: 09/13/2022 Elsevier Patient Education  2024 [employer].

## 2024-06-26 ENCOUNTER — Ambulatory Visit: Payer: Self-pay | Admitting: Neurology

## 2024-06-30 ENCOUNTER — Other Ambulatory Visit: Payer: Self-pay

## 2024-06-30 ENCOUNTER — Ambulatory Visit (HOSPITAL_BASED_OUTPATIENT_CLINIC_OR_DEPARTMENT_OTHER): Admit: 2024-06-30 | Admitting: Plastic Surgery

## 2024-06-30 ENCOUNTER — Encounter (HOSPITAL_BASED_OUTPATIENT_CLINIC_OR_DEPARTMENT_OTHER): Payer: Self-pay

## 2024-06-30 ENCOUNTER — Ambulatory Visit: Attending: Neurology

## 2024-06-30 DIAGNOSIS — R2681 Unsteadiness on feet: Secondary | ICD-10-CM | POA: Insufficient documentation

## 2024-06-30 DIAGNOSIS — H8112 Benign paroxysmal vertigo, left ear: Secondary | ICD-10-CM | POA: Diagnosis present

## 2024-06-30 DIAGNOSIS — R42 Dizziness and giddiness: Secondary | ICD-10-CM | POA: Insufficient documentation

## 2024-06-30 LAB — COMPREHENSIVE METABOLIC PANEL WITH GFR
ALT: 11 IU/L (ref 0–32)
AST: 11 IU/L (ref 0–40)
Albumin: 4 g/dL (ref 3.9–4.9)
Alkaline Phosphatase: 112 IU/L (ref 49–135)
BUN/Creatinine Ratio: 17 (ref 12–28)
BUN: 14 mg/dL (ref 8–27)
Bilirubin Total: <0.2 mg/dL (ref 0.0–1.2)
CO2: 20 mmol/L (ref 20–29)
Calcium: 9.2 mg/dL (ref 8.7–10.3)
Chloride: 107 mmol/L — ABNORMAL HIGH (ref 96–106)
Creatinine, Ser: 0.83 mg/dL (ref 0.57–1.00)
Globulin, Total: 2.1 g/dL (ref 1.5–4.5)
Glucose: 91 mg/dL (ref 70–99)
Potassium: 4.2 mmol/L (ref 3.5–5.2)
Sodium: 142 mmol/L (ref 134–144)
Total Protein: 6.1 g/dL (ref 6.0–8.5)
eGFR: 80 mL/min/1.73 (ref >59)

## 2024-06-30 LAB — CBC
Hematocrit: 36.5 % (ref 34.0–46.6)
Hemoglobin: 12.2 g/dL (ref 11.1–15.9)
MCH: 34.1 pg — ABNORMAL HIGH (ref 26.6–33.0)
MCHC: 33.4 g/dL (ref 31.5–35.7)
MCV: 102 fL — ABNORMAL HIGH (ref 79–97)
Platelets: 279 x10E3/uL (ref 150–450)
RBC: 3.58 x10E6/uL — ABNORMAL LOW (ref 3.77–5.28)
RDW: 11.8 % (ref 11.7–15.4)
WBC: 8.4 x10E3/uL (ref 3.4–10.8)

## 2024-06-30 LAB — PROTEIN ELECTROPHORESIS, SERUM
A/G Ratio: 1.1 (ref 0.7–1.7)
Albumin ELP: 3.2 g/dL (ref 2.9–4.4)
Alpha 1: 0.3 g/dL (ref 0.0–0.4)
Alpha 2: 0.9 g/dL (ref 0.4–1.0)
Beta: 1.2 g/dL (ref 0.7–1.3)
Gamma Globulin: 0.5 g/dL (ref 0.4–1.8)
Globulin, Total: 2.9 g/dL (ref 2.2–3.9)
M-Spike, %: 0.2 g/dL — ABNORMAL HIGH

## 2024-06-30 LAB — IRON,TIBC AND FERRITIN PANEL
Ferritin: 40 ng/mL (ref 15–150)
Iron Saturation: 17 % (ref 15–55)
Iron: 55 ug/dL (ref 27–139)
Total Iron Binding Capacity: 333 ug/dL (ref 250–450)
UIBC: 278 ug/dL (ref 118–369)

## 2024-06-30 LAB — CK TOTAL AND CKMB (NOT AT ARMC)
CK-MB Index: 1.9 ng/mL (ref 0.0–5.3)
Total CK: 74 U/L (ref 32–182)

## 2024-06-30 SURGERY — PANNICULECTOMY
Anesthesia: Choice

## 2024-06-30 NOTE — Therapy (Signed)
 OUTPATIENT PHYSICAL THERAPY VESTIBULAR EVALUATION     Patient Name: Sherry Dennis MRN: [id number] DOB:12-30-1960, 63 y.o., female Today's Date: 06/30/2024  END OF SESSION:  PT End of Session - 06/30/24 0918     Visit Number 1    Number of Visits 4    Date for Recertification  07/28/24    Authorization Type [employer]    Authorization Time Period auth required, submitted 11/18    PT Start Time 0930    PT Stop Time 1015    PT Time Calculation (min) 45 min          Past Medical History:  Diagnosis Date   Allergy    Anemia    Anxiety    Arthritis    knees, hands, ankles, ~ every joint (04/08/2015)   Basal cell carcinoma    face, hands, chest (04/08/2015)   Chronic bronchitis (HCC)    get it pretty much q yr (04/08/2015)   COPD (chronic obstructive pulmonary disease) (HCC)    Diabetes mellitus without complication (HCC)    Dysrhythmia    palpitations occ; SVT 09/2013 s/p adenosine    GERD (gastroesophageal reflux disease)    H/O hiatal hernia    Hx of cardiovascular stress test    Lexiscan  Myoview 6/16: Ejection fraction is 60% and wall motion is normal. The study is normal. There is no scar or ischemia. This is a low risk scan.   Hyperlipidemia    Hypertension    Hypothyroidism    Osteoporosis    Palpitations    PONV (postoperative nausea and vomiting)    S/P total knee arthroplasty 02/15/2014   Sleep apnea    uses CPAP nightly   Thyroid  disease    Vitamin D  deficiency    Past Surgical History:  Procedure Laterality Date   BASAL CELL CARCINOMA EXCISION  2014   off my chest   BLADDER REPAIR  1993   lasered the holes shut   CARDIAC CATHETERIZATION  2017   CARDIAC SURGERY     CARPAL TUNNEL RELEASE Right 1990   CLOSED REDUCTION FINGER WITH PERCUTANEOUS PINNING Right 03/28/2023   Procedure: CLOSED REDUCTION PERCUTANEOUS PINNING RIGHT SMALL FINGER PROXIMAL PHALANX FRACTURE AND CLOSED REDUCTION PERCUTANEOUS PINNING RIGHT RING FINGER PROXIMAL PHALANX  AND RIGHT SMALL METACARPAL HEAD;  Surgeon: Murrell Drivers, MD;  Location: [facility];  Service: Orthopedics;  Laterality: Right;   COLONOSCOPY     COLONOSCOPY WITH PROPOFOL  N/A 11/25/2017   Procedure: COLONOSCOPY WITH PROPOFOL ;  Surgeon: Shila Gustav GAILS, MD;  Location: [facility];  Service: Endoscopy;  Laterality: N/A;   ELECTROPHYSIOLOGIC STUDY N/A 04/08/2015   Procedure: SVT Ablation;  Surgeon: Danelle LELON Birmingham, MD;  Location: [facility] IN[facility] CV [facility];  Service: Cardiovascular;  Laterality: N/A;   ENDOMETRIAL ABLATION  2009   HARDWARE REMOVAL Right 05/14/2023   Procedure: REMOVAL RETAINED K-WIRES RIGHT HAND;  Surgeon: Murrell Drivers, MD;  Location: [facility];  Service: Orthopedics;  Laterality: Right;  30 MIN   INCONTINENCE SURGERY  2010   JOINT REPLACEMENT     KNEE ARTHROSCOPY Right 2013   KNEE ARTHROSCOPY     LAPAROSCOPIC GASTRIC SLEEVE RESECTION N/A 05/26/2019   Procedure: LAPAROSCOPIC GASTRIC SLEEVE RESECTION, Upper Endo, ERAS Pathway;  Surgeon: Tanda Locus, MD;  Location: [facility];  Service: General;  Laterality: N/A;   PARTIAL KNEE ARTHROPLASTY Right 02/15/2014   Procedure: RIGHT UNICOMPARTMENTAL KNEE (MEDIAL COMPARTMENT);  Surgeon: Marcey Raman, MD;  [facility];  Service: Orthopedics;  Laterality: Right;  POLYPECTOMY     REVERSE SHOULDER ARTHROPLASTY Right 04/27/2021   Procedure: REVERSE SHOULDER ARTHROPLASTY;  Surgeon: Melita Drivers, MD;  Location: [facility];  Service: Orthopedics;  Laterality: Right;    SPLENECTOMY, TOTAL  05/26/2019   Procedure: SPLENECTOMY;  Surgeon: Tanda Locus, MD;  Location: [facility];  Service: General;;   TUBAL LIGATION  1987   Patient Active Problem List   Diagnosis Date Noted   Anxiety 10/20/2020   H/O splenectomy 06/17/2019   Morbid obesity (HCC) 05/26/2019   S/P laparoscopic sleeve gastrectomy 05/26/2019   Former smoker 03/25/2019   OSA on CPAP 09/16/2018   Nocturnal hypoxemia due to emphysema (HCC) 09/16/2018   History  of colonic polyps    Polyp of cecum    Aortic atherosclerosis (HCC) - per CT 08/31/2017 09/02/2017   Adrenal adenoma 09/02/2017   External hemorrhoid, bleeding 08/14/2017   Rectal pain 08/14/2017   Rheumatoid arthritis (HCC) 07/24/2017   COPD GOLD 0/ former smoker at risk 07/24/2017   PSVT (paroxysmal supraventricular tachycardia) 01/03/2015   Hypertension    Hyperlipidemia    Thyroid  disease    Vitamin D  deficiency     PCP: Corlis Pagan, NP REFERRING PROVIDER: Chalice Saunas, MD  REFERRING DIAG: I67.9,R42 (ICD-10-CM) - Vertigo due to cerebrovascular disease H81.12 (ICD-10-CM) - BPV (benign positional vertigo), left M50.30 (ICD-10-CM) - DDD (degenerative disc disease), cervical I70.0 (ICD-10-CM) - Aortic atherosclerosis J43.9,G47.36 (ICD-10-CM) - Nocturnal hypoxemia due to emphysema  THERAPY DIAG:  Dizziness and giddiness  BPPV (benign paroxysmal positional vertigo), left  Unsteadiness on feet  ONSET DATE: several years  Rationale for Evaluation and Treatment: Rehabilitation  SUBJECTIVE:   SUBJECTIVE STATEMENT: Experiencing vertigo over past year and notes unsteadiness and poor balance w/ hx of falling. Notes sporadic instances of spinning/dizziness noting sometimes bending over, or fast movements can trigger but the imbalance is present all the time.   Pt accompanied by: self  PERTINENT HISTORY: rheumatoid arthritis, has irritable bowel syndrome, she has prediabetes, vitamin D  deficiency and a low thyroid -stimulating hormonepartial knee replacement in 2016 and a heart ablation in 2016 for Wolff-Parkinson-White with tachycardia and palpitations. In August 2024 she fell and broke the right wrist - her dominant hand. She has fallen at work. She has difficultly with buttoning her shirts but no headaches no nausea and no SOB. Every day she has a feeling of poor balance. She stumble s over her toes, may shuffle.  PAIN:  Are you having pain? Yes: NPRS scale: 3/10 Pain location:  neck, shoulders, back Pain description: from the DDD Aggravating factors: activity Relieving factors: rest relieves   PRECAUTIONS: None  RED FLAGS: None   WEIGHT BEARING RESTRICTIONS: No  FALLS: Has patient fallen in last 6 months? Yes. Number of falls 2  LIVING ENVIRONMENT: Lives with: lives alone Lives in: House/apartment Stairs: No Has following equipment at home: None  PLOF: Independent. Works as Doctor, hospital for [employer], still working   PATIENT GOALS:   OBJECTIVE:  Note: Objective measures were completed at Evaluation unless otherwise noted.  DIAGNOSTIC FINDINGS: n/a for episode, reports future MRI of brain  COGNITION: Overall cognitive status: Within functional limits for tasks assessed   SENSATION: Not tested  EDEMA:  None noted    Cervical ROM:    Active A/PROM (deg) eval  Flexion WNL  Extension WFL  Right lateral flexion -25%  Left lateral flexion -10%  Right rotation -25%  Left rotation -10%  (Blank rows = not tested)  STRENGTH:   LOWER EXTREMITY MMT:  5/5 strength to seated resisted tests  BED MOBILITY:  indep  TRANSFERS: indep   CURB: Complete Independence  GAIT: Gait pattern: WFL Distance walked:  Assistive device utilized: None Level of assistance: Complete Independence Comments:     PATIENT SURVEYS:  DHI: 88/10  VESTIBULAR ASSESSMENT:  GENERAL OBSERVATION:    SYMPTOM BEHAVIOR:  Subjective history: reports constant feeling of unsteadiness/imbalance.  Dizziness/spinning wit certain movements/activities  Non-Vestibular symptoms: neck pain  Type of dizziness: Imbalance (Disequilibrium), Spinning/Vertigo, and Unsteady with head/body turns  Frequency: daily  Duration: brief-seconds  Aggravating factors: notes bed mobility movements or bending back/forward provoking  Relieving factors: slow movements  Progression of symptoms: unchanged  OCULOMOTOR EXAM:  Ocular Alignment: normal  Ocular ROM: No  Limitations  Spontaneous Nystagmus: absent  Gaze-Induced Nystagmus: absent  Smooth Pursuits: intact  Saccades: intact  Convergence/Divergence: 3 cm   Cover-cross-cover test: Deviations: NT   VESTIBULAR - OCULAR REFLEX:   Slow VOR: Comment: feels some symptoms with vertical  VOR Cancellation: Comment: feels some lightheaded sensation  Head-Impulse Test: HIT Right: negative HIT Left: negative  Dynamic Visual Acuity: Not able to be assessed   POSITIONAL TESTING: Right Dix-Hallpike: no nystagmus Left Dix-Hallpike: difficult to see nystagmus w/ pt closing eyes but reports vertigo x 5-7 sec Right Roll Test: no nystagmus Left Roll Test: feels symptomatic  MOTION SENSITIVITY:  Motion Sensitivity Quotient Intensity: 0 = none, 1 = Lightheaded, 2 = Mild, 3 = Moderate, 4 = Severe, 5 = Vomiting  Intensity  1. Sitting to supine   2. Supine to L side   3. Supine to R side   4. Supine to sitting   5. L Hallpike-Dix   6. Up from L    7. R Hallpike-Dix   8. Up from R    9. Sitting, head tipped to L knee   10. Head up from L knee   11. Sitting, head tipped to R knee   12. Head up from R knee   13. Sitting head turns x5   14.Sitting head nods x5   15. In stance, 180 turn to L    16. In stance, 180 turn to R     OTHOSTATICS: not done  FUNCTIONAL GAIT: 5 times sit to stand: 15 sec     Functional Gait Assessment: 28/30  [facility] PT Assessment - 06/30/24 0001       Functional Gait  Assessment   Gait assessed  Yes    Gait Level Surface Walks 20 ft in less than 5.5 sec, no assistive devices, good speed, no evidence for imbalance, normal gait pattern, deviates no more than 6 in outside of the 12 in walkway width.    Change in Gait Speed Able to smoothly change walking speed without loss of balance or gait deviation. Deviate no more than 6 in outside of the 12 in walkway width.    Gait with Horizontal Head Turns Performs head turns smoothly with no change in gait. Deviates no more than 6 in  outside 12 in walkway width    Gait with Vertical Head Turns Performs task with slight change in gait velocity (eg, minor disruption to smooth gait path), deviates 6 - 10 in outside 12 in walkway width or uses assistive device    Gait and Pivot Turn Pivot turns safely within 3 sec and stops quickly with no loss of balance.    Step Over Obstacle Is able to step over 2 stacked shoe boxes taped together (9 in total height) without  changing gait speed. No evidence of imbalance.    Gait with Narrow Base of Support Is able to ambulate for 10 steps heel to toe with no staggering.    Gait with Eyes Closed Walks 20 ft, no assistive devices, good speed, no evidence of imbalance, normal gait pattern, deviates no more than 6 in outside 12 in walkway width. Ambulates 20 ft in less than 7 sec.    Ambulating Backwards Walks 20 ft, uses assistive device, slower speed, mild gait deviations, deviates 6-10 in outside 12 in walkway width.    Steps Alternating feet, no rail.    Total Score 28           M-CTSIB  Condition 1: Firm Surface, EO 30 Sec, Normal Sway  Condition 2: Firm Surface, EC 30 Sec, Mild Sway  Condition 3: Foam Surface, EO 30 Sec, Normal Sway  Condition 4: Foam Surface, EC 30 Sec, Mild Sway                                                                                                                               TREATMENT DATE: 06/30/24   Canalith Repositioning:  Epley Left: Number of Reps: 2--no dizziness/nystagmus to repeat testing   PATIENT EDUCATION: Education details: assessment details, rationale of intervention, tests/measures, post-tx considerations Person educated: Patient Education method: Explanation Education comprehension: verbalized understanding  HOME EXERCISE PROGRAM:  GOALS: Goals reviewed with patient? Yes  SHORT TERM GOALS: Target date: same as LTG   LONG TERM GOALS: Target date: 07/28/2024    Patient will be independent in HEP to improve functional  outcomes Baseline:  Goal status: INITIAL  2.  Pt to report improved symptoms and reduced dysfunction per score 70% DHI Baseline: 88% Goal status: INITIAL  3.  Pt to be free of positional dizziness to improve comfort and reduce risk for falls/imbalance Baseline: +Left Dix-Hallpike Goal status: INITIAL   ASSESSMENT:  CLINICAL IMPRESSION: Patient is a 63 y.o. lady who was seen today for physical therapy evaluation and treatment for I67.9,R42 (ICD-10-CM) - Vertigo due to cerebrovascular disease H81.12 (ICD-10-CM) - BPV (benign positional vertigo), left M50.30 (ICD-10-CM) - DDD (degenerative disc disease), cervical I70.0 (ICD-10-CM) - Aortic atherosclerosis J43.9,G47.36 (ICD-10-CM) - Nocturnal hypoxemia due to emphysema. Reports ongoing issues of positional dizziness and imbalance.  Testing reveals symptom provocation to vertical VOR and symptomatic/vertiginous to left Dix-Hallpike positioning.  Performed two repetitions of Epley maneuver with resolution of symptoms. Balance tests/measures reveal good balance with low risk for falls evident by 5xSTS and Functional Gait Assessment 28/30. Pt would benefit from continued PT services to address deficits and limitations  OBJECTIVE IMPAIRMENTS: decreased balance and dizziness.   ACTIVITY LIMITATIONS: bending, transfers, bed mobility, reach over head, and locomotion level  PARTICIPATION LIMITATIONS: meal prep, cleaning, community activity, and occupation  PERSONAL FACTORS: Age, Time since onset of injury/illness/exacerbation, and 3+ comorbidities: PMH including RA, DDD, neck pain are also affecting patient's functional outcome.   REHAB POTENTIAL:  Excellent  CLINICAL DECISION MAKING: Evolving/moderate complexity  EVALUATION COMPLEXITY: Moderate   PLAN:  PT FREQUENCY: 1x/week  PT DURATION: 4 weeks  PLANNED INTERVENTIONS: 97750- Physical Performance Testing, 97110-Therapeutic exercises, 97530- Therapeutic activity, [id number]- Neuromuscular  re-education, 97535- Self Care, 02859- Manual therapy, [phone]- Gait training, [phone]- Canalith repositioning, [id number]- Aquatic Therapy, [phone]- Electrical stimulation (unattended), and Patient/Family education  PLAN FOR NEXT SESSION: re-assess BPPV as indicated, balance tests went well, are there other movements/activities that cause imbalance?  12:54 PM, 06/30/24 M. Kelly Dashia Caldeira, PT, DPT Physical Therapist- [place] Office Number: [phone]

## 2024-07-02 ENCOUNTER — Encounter: Payer: Self-pay | Admitting: Neurology

## 2024-07-06 ENCOUNTER — Encounter: Admitting: Plastic Surgery

## 2024-07-08 ENCOUNTER — Encounter: Admitting: Plastic Surgery

## 2024-07-14 ENCOUNTER — Ambulatory Visit: Attending: Neurology

## 2024-07-14 DIAGNOSIS — R2681 Unsteadiness on feet: Secondary | ICD-10-CM | POA: Diagnosis present

## 2024-07-14 DIAGNOSIS — R42 Dizziness and giddiness: Secondary | ICD-10-CM | POA: Insufficient documentation

## 2024-07-14 DIAGNOSIS — H8112 Benign paroxysmal vertigo, left ear: Secondary | ICD-10-CM | POA: Insufficient documentation

## 2024-07-14 NOTE — Therapy (Signed)
 OUTPATIENT PHYSICAL THERAPY VESTIBULAR TREATMENT     Patient Name: Sherry Dennis MRN: [id number] DOB:[birth year], 63 y.o., female Today's Date: 07/14/2024  END OF SESSION:  PT End of Session - 07/14/24 0806     Visit Number 2    Number of Visits 4    Date for Recertification  07/28/24    Authorization Type [employer]    Authorization Time Period auth required, submitted 11/18    PT Start Time 0804    PT Stop Time 0845    PT Time Calculation (min) 41 min          Past Medical History:  Diagnosis Date   Allergy    Anemia    Anxiety    Arthritis    knees, hands, ankles, ~ every joint (04/08/2015)   Basal cell carcinoma    face, hands, chest (04/08/2015)   Chronic bronchitis (HCC)    get it pretty much q yr (04/08/2015)   COPD (chronic obstructive pulmonary disease) (HCC)    Diabetes mellitus without complication (HCC)    Dysrhythmia    palpitations occ; SVT 09/2013 s/p adenosine    GERD (gastroesophageal reflux disease)    H/O hiatal hernia    Hx of cardiovascular stress test    Lexiscan  Myoview 6/16: Ejection fraction is 60% and wall motion is normal. The study is normal. There is no scar or ischemia. This is a low risk scan.   Hyperlipidemia    Hypertension    Hypothyroidism    Osteoporosis    Palpitations    PONV (postoperative nausea and vomiting)    S/P total knee arthroplasty 02/15/2014   Sleep apnea    uses CPAP nightly   Thyroid  disease    Vitamin D  deficiency    Past Surgical History:  Procedure Laterality Date   BASAL CELL CARCINOMA EXCISION  2014   off my chest   BLADDER REPAIR  1993   lasered the holes shut   CARDIAC CATHETERIZATION  2017   CARDIAC SURGERY     CARPAL TUNNEL RELEASE Right 1990   CLOSED REDUCTION FINGER WITH PERCUTANEOUS PINNING Right 03/28/2023   Procedure: CLOSED REDUCTION PERCUTANEOUS PINNING RIGHT SMALL FINGER PROXIMAL PHALANX FRACTURE AND CLOSED REDUCTION PERCUTANEOUS PINNING RIGHT RING FINGER PROXIMAL PHALANX AND  RIGHT SMALL METACARPAL HEAD;  Surgeon: Murrell Drivers, MD;  Location: [facility];  Service: Orthopedics;  Laterality: Right;   COLONOSCOPY     COLONOSCOPY WITH PROPOFOL  N/A 11/25/2017   Procedure: COLONOSCOPY WITH PROPOFOL ;  Surgeon: Shila Gustav GAILS, MD;  Location: [facility];  Service: Endoscopy;  Laterality: N/A;   ELECTROPHYSIOLOGIC STUDY N/A 04/08/2015   Procedure: SVT Ablation;  Surgeon: Danelle LELON Birmingham, MD;  Location: [facility] INVAS[facility] CV [facility];  Service: Cardiovascular;  Laterality: N/A;   ENDOMETRIAL ABLATION  2009   HARDWARE REMOVAL Right 05/14/2023   Procedure: REMOVAL RETAINED K-WIRES RIGHT HAND;  Surgeon: Murrell Drivers, MD;  Location: [facility];  Service: Orthopedics;  Laterality: Right;  30 MIN   INCONTINENCE SURGERY  2010   JOINT REPLACEMENT     KNEE ARTHROSCOPY Right 2013   KNEE ARTHROSCOPY     LAPAROSCOPIC GASTRIC SLEEVE RESECTION N/A 05/26/2019   Procedure: LAPAROSCOPIC GASTRIC SLEEVE RESECTION, Upper Endo, ERAS Pathway;  Surgeon: Tanda Locus, MD;  Location: [facility];  Service: General;  Laterality: N/A;   PARTIAL KNEE ARTHROPLASTY Right 02/15/2014   Procedure: RIGHT UNICOMPARTMENTAL KNEE (MEDIAL COMPARTMENT);  Surgeon: Marcey Raman, MD;  Location: [facility];  Service: Orthopedics;  Laterality: Right;  POLYPECTOMY     REVERSE SHOULDER ARTHROPLASTY Right 04/27/2021   Procedure: REVERSE SHOULDER ARTHROPLASTY;  Surgeon: Melita Drivers, MD;  Location: [facility];  Service: Orthopedics;  Laterality: Right;    SPLENECTOMY, TOTAL  05/26/2019   Procedure: SPLENECTOMY;  Surgeon: Tanda Locus, MD;  Location: [facility];  Service: General;;   TUBAL LIGATION  1987   Patient Active Problem List   Diagnosis Date Noted   Anxiety 10/20/2020   H/O splenectomy 06/17/2019   Morbid obesity (HCC) 05/26/2019   S/P laparoscopic sleeve gastrectomy 05/26/2019   Former smoker 03/25/2019   OSA on CPAP 09/16/2018   Nocturnal hypoxemia due to emphysema (HCC) 09/16/2018   History of  colonic polyps    Polyp of cecum    Aortic atherosclerosis (HCC) - per CT 08/31/2017 09/02/2017   Adrenal adenoma 09/02/2017   External hemorrhoid, bleeding 08/14/2017   Rectal pain 08/14/2017   Rheumatoid arthritis (HCC) 07/24/2017   COPD GOLD 0/ former smoker at risk 07/24/2017   PSVT (paroxysmal supraventricular tachycardia) 01/03/2015   Hypertension    Hyperlipidemia    Thyroid  disease    Vitamin D  deficiency     PCP: Corlis Pagan, NP REFERRING PROVIDER: Dohmeier, Dedra, MD  REFERRING DIAG: I67.9,R42 (ICD-10-CM) - Vertigo due to cerebrovascular disease H81.12 (ICD-10-CM) - BPV (benign positional vertigo), left M50.30 (ICD-10-CM) - DDD (degenerative disc disease), cervical I70.0 (ICD-10-CM) - Aortic atherosclerosis J43.9,G47.36 (ICD-10-CM) - Nocturnal hypoxemia due to emphysema  THERAPY DIAG:  Dizziness and giddiness  BPPV (benign paroxysmal positional vertigo), left  Unsteadiness on feet  ONSET DATE: several years  Rationale for Evaluation and Treatment: Rehabilitation  SUBJECTIVE:   SUBJECTIVE STATEMENT: Feeling much better, no episodes of dizziness since last session here and feeling steadier on feet.  Pt accompanied by: self  PERTINENT HISTORY: rheumatoid arthritis, has irritable bowel syndrome, she has prediabetes, vitamin D  deficiency and a low thyroid -stimulating hormonepartial knee replacement in 2016 and a heart ablation in 2016 for Wolff-Parkinson-White with tachycardia and palpitations. In August 2024 she fell and broke the right wrist - her dominant hand. She has fallen at work. She has difficultly with buttoning her shirts but no headaches no nausea and no SOB. Every day she has a feeling of poor balance. She stumble s over her toes, may shuffle.  PAIN:  Are you having pain? Yes: NPRS scale: 3/10 Pain location: neck, shoulders, back Pain description: from the DDD Aggravating factors: activity Relieving factors: rest relieves   PRECAUTIONS: None  RED  FLAGS: None   WEIGHT BEARING RESTRICTIONS: No  FALLS: Has patient fallen in last 6 months? Yes. Number of falls 2  LIVING ENVIRONMENT: Lives with: lives alone Lives in: House/apartment Stairs: No Has following equipment at home: None  PLOF: Independent. Works as Doctor, hospital for [employer], still working   PATIENT GOALS:   OBJECTIVE:   TODAY'S TREATMENT: 07/14/24 Activity Comments  Self-assessment Instructed in techniques for canalithiasis assessment                   Note: Objective measures were completed at Evaluation unless otherwise noted.  DIAGNOSTIC FINDINGS: n/a for episode, reports future MRI of brain  COGNITION: Overall cognitive status: Within functional limits for tasks assessed   SENSATION: Not tested  EDEMA:  None noted    Cervical ROM:    Active A/PROM (deg) eval  Flexion WNL  Extension WFL  Right lateral flexion -25%  Left lateral flexion -10%  Right rotation -25%  Left rotation -10%  (Blank rows =  not tested)  STRENGTH:   LOWER EXTREMITY MMT:   5/5 strength to seated resisted tests  BED MOBILITY:  indep  TRANSFERS: indep   CURB: Complete Independence  GAIT: Gait pattern: WFL Distance walked:  Assistive device utilized: None Level of assistance: Complete Independence Comments:     PATIENT SURVEYS:  DHI: 88/10  VESTIBULAR ASSESSMENT:  GENERAL OBSERVATION:    SYMPTOM BEHAVIOR:  Subjective history: reports constant feeling of unsteadiness/imbalance.  Dizziness/spinning wit certain movements/activities  Non-Vestibular symptoms: neck pain  Type of dizziness: Imbalance (Disequilibrium), Spinning/Vertigo, and Unsteady with head/body turns  Frequency: daily  Duration: brief-seconds  Aggravating factors: notes bed mobility movements or bending back/forward provoking  Relieving factors: slow movements  Progression of symptoms: unchanged  OCULOMOTOR EXAM:  Ocular Alignment: normal  Ocular ROM: No  Limitations  Spontaneous Nystagmus: absent  Gaze-Induced Nystagmus: absent  Smooth Pursuits: intact  Saccades: intact  Convergence/Divergence: 3 cm   Cover-cross-cover test: Deviations: NT   VESTIBULAR - OCULAR REFLEX:   Slow VOR: Comment: feels some symptoms with vertical  VOR Cancellation: Comment: feels some lightheaded sensation  Head-Impulse Test: HIT Right: negative HIT Left: negative  Dynamic Visual Acuity: Not able to be assessed   POSITIONAL TESTING: Right Dix-Hallpike: no nystagmus Left Dix-Hallpike: difficult to see nystagmus w/ pt closing eyes but reports vertigo x 5-7 sec Right Roll Test: no nystagmus Left Roll Test: feels symptomatic  MOTION SENSITIVITY:  Motion Sensitivity Quotient Intensity: 0 = none, 1 = Lightheaded, 2 = Mild, 3 = Moderate, 4 = Severe, 5 = Vomiting  Intensity  1. Sitting to supine   2. Supine to L side   3. Supine to R side   4. Supine to sitting   5. L Hallpike-Dix   6. Up from L    7. R Hallpike-Dix   8. Up from R    9. Sitting, head tipped to L knee   10. Head up from L knee   11. Sitting, head tipped to R knee   12. Head up from R knee   13. Sitting head turns x5   14.Sitting head nods x5   15. In stance, 180 turn to L    16. In stance, 180 turn to R     OTHOSTATICS: not done  FUNCTIONAL GAIT: 5 times sit to stand: 15 sec     Functional Gait Assessment: 28/30     M-CTSIB  Condition 1: Firm Surface, EO 30 Sec, Normal Sway  Condition 2: Firm Surface, EC 30 Sec, Mild Sway  Condition 3: Foam Surface, EO 30 Sec, Normal Sway  Condition 4: Foam Surface, EC 30 Sec, Mild Sway                                                                                                                               TREATMENT DATE: 06/30/24   Canalith Repositioning:  Epley Left: Number of Reps: 2--no dizziness/nystagmus to repeat testing   PATIENT EDUCATION:  Education details: assessment details, rationale of intervention,  tests/measures, post-tx considerations Person educated: Patient Education method: Explanation Education comprehension: verbalized understanding  HOME EXERCISE PROGRAM: Access Code: [facility] URL: [email or web address] Date: 07/14/2024 Prepared by: Burnard Sandifer  Exercises - Left 270 Degree Roll  - Self-Epley Maneuver Right Ear  - Self-Epley Maneuver Left Ear   GOALS: Goals reviewed with patient? Yes  SHORT TERM GOALS: Target date: same as LTG   LONG TERM GOALS: Target date: 07/28/2024    Patient will be independent in HEP to improve functional outcomes Baseline:  Goal status: INITIAL  2.  Pt to report improved symptoms and reduced dysfunction per score 70% DHI Baseline: 88% Goal status: MET  3.  Pt to be free of positional dizziness to improve comfort and reduce risk for falls/imbalance Baseline: +Left Dix-Hallpike; no positional vertigo present Goal status: MET   ASSESSMENT:  CLINICAL IMPRESSION: Returns to clinic and reports resolution of symptoms.  Instructed in self-assessment techniques for canalithiasis with good teach-back and instructions provided for techniques and interpretation for self-treatment if she feels inclined and verbalized understanding. No provocation to symptoms with activities or positions today.   Will leave case open through 12/16 in case symptoms return with pt verbalizing understanding  OBJECTIVE IMPAIRMENTS: decreased balance and dizziness.   ACTIVITY LIMITATIONS: bending, transfers, bed mobility, reach over head, and locomotion level  PARTICIPATION LIMITATIONS: meal prep, cleaning, community activity, and occupation  PERSONAL FACTORS: Age, Time since onset of injury/illness/exacerbation, and 3+ comorbidities: PMH including RA, DDD, neck pain are also affecting patient's functional outcome.   REHAB POTENTIAL: Excellent  CLINICAL DECISION MAKING: Evolving/moderate complexity  EVALUATION COMPLEXITY:  Moderate   PLAN:  PT FREQUENCY: 1x/week  PT DURATION: 4 weeks  PLANNED INTERVENTIONS: 97750- Physical Performance Testing, 97110-Therapeutic exercises, 97530- Therapeutic activity, [id number]- Neuromuscular re-education, 97535- Self Care, [id number]- Manual therapy, [id number]- Gait training, [phone]- Canalith repositioning, [id number]- Aquatic Therapy, H9716- Electrical stimulation (unattended), and Patient/Family education  PLAN FOR NEXT SESSION: D/C if resolved  8:06 AM, 07/14/24 M. Kelly Mihir Flanigan, PT, DPT Physical Therapist- [place] Office Number: [phone]

## 2024-07-20 ENCOUNTER — Encounter: Admitting: Physician Assistant

## 2024-07-21 ENCOUNTER — Ambulatory Visit
Admission: RE | Admit: 2024-07-21 | Discharge: 2024-07-21 | Disposition: A | Source: Ambulatory Visit | Attending: Neurology | Admitting: Neurology

## 2024-07-21 DIAGNOSIS — M5030 Other cervical disc degeneration, unspecified cervical region: Secondary | ICD-10-CM

## 2024-07-21 DIAGNOSIS — J439 Emphysema, unspecified: Secondary | ICD-10-CM

## 2024-07-21 DIAGNOSIS — I700 Atherosclerosis of aorta: Secondary | ICD-10-CM

## 2024-07-21 DIAGNOSIS — I679 Cerebrovascular disease, unspecified: Secondary | ICD-10-CM

## 2024-07-21 DIAGNOSIS — H8112 Benign paroxysmal vertigo, left ear: Secondary | ICD-10-CM

## 2024-07-21 DIAGNOSIS — R42 Dizziness and giddiness: Secondary | ICD-10-CM | POA: Diagnosis not present

## 2024-07-21 DIAGNOSIS — M05711 Rheumatoid arthritis with rheumatoid factor of right shoulder without organ or systems involvement: Secondary | ICD-10-CM

## 2024-07-21 MED ORDER — GADOPICLENOL 0.5 MMOL/ML IV SOLN
8.0000 mL | Freq: Once | INTRAVENOUS | Status: AC | PRN
  Administered 2024-07-21: 8 mL via INTRAVENOUS

## 2024-09-13 ENCOUNTER — Other Ambulatory Visit: Payer: Self-pay

## 2024-09-13 ENCOUNTER — Emergency Department (HOSPITAL_BASED_OUTPATIENT_CLINIC_OR_DEPARTMENT_OTHER)
Admission: EM | Admit: 2024-09-13 | Discharge: 2024-09-13 | Disposition: A | Discharge status: Home / Self Care | Attending: Emergency Medicine | Admitting: Emergency Medicine

## 2024-09-13 ENCOUNTER — Emergency Department (HOSPITAL_BASED_OUTPATIENT_CLINIC_OR_DEPARTMENT_OTHER): Admitting: Radiology

## 2024-09-13 DIAGNOSIS — W000XXA Fall on same level due to ice and snow, initial encounter: Secondary | ICD-10-CM | POA: Diagnosis not present

## 2024-09-13 DIAGNOSIS — S40012A Contusion of left shoulder, initial encounter: Secondary | ICD-10-CM | POA: Insufficient documentation

## 2024-09-13 DIAGNOSIS — S4992XA Unspecified injury of left shoulder and upper arm, initial encounter: Secondary | ICD-10-CM | POA: Diagnosis present

## 2024-09-13 DIAGNOSIS — W19XXXA Unspecified fall, initial encounter: Secondary | ICD-10-CM

## 2024-09-13 NOTE — ED Provider Notes (Signed)
 " [place] EMERGENCY DEPARTMENT AT [facility] Provider Note   CSN: [id number] Arrival date & time: 09/13/24  1450     Patient presents with: Sherry Dennis is a 64 y.o. female.   HPI Patient reports she was coming down her daughter's steps about 1 hour ago.  She slipped on ice and fell.  She struck her left shoulder and elbow when she fell.  She reports she has most of her pain right around her upper humerus.  She reports that she can move but it is very painful, no tingling.  No neck pain.  She did not strike her head.  She has been up and walking.  No hip or lower extremity pain.    Prior to Admission medications  Medication Sig Start Date End Date Taking? Authorizing Provider  abatacept (ORENCIA) 250 MG injection as directed Intravenous    [provider]  acetaminophen  (TYLENOL ) 650 MG CR tablet Take 650 mg by mouth 3 (three) times daily as needed for pain.    [provider]  ALPRAZolam  (XANAX ) 1 MG tablet Take 1 tablet (1 mg total) by mouth at bedtime as needed for anxiety (claustrophobia pre medication for MRI). 06/25/24   Dohmeier, Dedra, MD  atorvastatin  (LIPITOR) 10 MG tablet Take 1 tablet (10 mg total) by mouth daily. 03/04/23   Cranford, Tonya, NP  Budeson-Glycopyrrol-Formoterol (BREZTRI  AEROSPHERE) 160-9-4.8 MCG/ACT AERO Inhale 1 Pump into the lungs in the morning and at bedtime. 01/22/20   Craig Alan SAUNDERS, PA-C  cyanocobalamin  (VITAMIN B12) 1000 MCG/ML injection INJECT 1 ML (1000MCG) ONCE WEEKLY FOR 6 WEEKS, THEN ONCE A MONTH 06/30/23   Wilkinson, Dana E, NP  cyclobenzaprine  (FLEXERIL ) 10 MG tablet Take 1 tablet (10 mg total) by mouth 3 (three) times daily as needed for muscle spasms. 04/27/21   Shuford, Randine, PA-C  escitalopram  (LEXAPRO ) 20 MG tablet TAKE 1 TABLET BY MOUTH EVERY DAY 07/03/23   Wilkinson, Dana E, NP  famotidine (PEPCID) 20 MG tablet 1 tablet at bedtime as needed Orally Once a day for 30 day(s) 11/12/23   [provider]  folic acid  (FOLVITE ) 1 MG tablet TAKE 2 TABLETS (2 MG TOTAL) BY MOUTH AT BEDTIME. 08/11/23   Wilkinson, Dana E, NP  levothyroxine  (SYNTHROID ) 137 MCG tablet TAKE 1 TABLET DAILY ON AN EMPTY STOMACH WITH ONLY WATER  FOR 30 MINUTES & NO ANTACID MEDS, CALCIUM  OR MAGNESIUM FOR 4 HOURS & AVOID BIOTIN 05/28/23   Wilkinson, Dana E, NP  meloxicam (MOBIC) 15 MG tablet Take 15 mg by mouth daily. 11/22/23   [provider]  methocarbamol  (ROBAXIN ) 500 MG tablet Take 500-1,000 mg by mouth every 6 (six) hours as needed.    [provider]  methotrexate 50 MG/2ML injection Inject 15 mg into the muscle once a week. 10/05/19   [provider]  naproxen  (NAPROSYN ) 500 MG tablet Take 1 tablet (500 mg total) by mouth 2 (two) times daily with a meal. 04/27/21   Shuford, Randine, PA-C  olmesartan  (BENICAR ) 40 MG tablet TAKE 1 TABLET BY MOUTH EVERY DAY FOR BLOOD PRESSURE 04/29/23   Cranford, Tonya, NP  ondansetron  (ZOFRAN ) 4 MG tablet Take 1 tablet (4 mg total) by mouth every 8 (eight) hours as needed for nausea or vomiting. 07/01/23   Cranford, Tonya, NP  OVER THE COUNTER MEDICATION daily at 12 noon. Stool softner    [provider]  tirzepatide (ZEPBOUND) 5 MG/0.5ML Pen 0.5 mL Subcutaneous 2 mL for 30 days 11/12/23  [provider]  tretinoin (RETIN-A) 0.025 % cream SMARTSIG:sparingly Topical Every Night 01/27/24   [provider]  Vitamin D , Ergocalciferol , (DRISDOL ) 1.25 MG (50000 UNIT) CAPS capsule TAKE 1 CAPSULE BY MOUTH 3 DAYS A WEEK FOR VITAMIN DEFICIENCY 05/03/21   Wilkinson, Dana E, NP    Allergies: Darvon [propoxyphene hcl], Propoxyphene, Doxycycline , Hydromorphone , Oseltamivir, Other, Oxycodone -acetaminophen , and Tamiflu [oseltamivir phosphate]    Review of Systems  Updated Vital Signs BP (!) 139/96 (BP Location: Right Arm)   Pulse 76   Temp 98.1 F (36.7 C)   Resp 18   SpO2 95%   Physical Exam Constitutional:      Comments: Alert nontoxic.  Clear  mental status.  No respiratory distress.  HENT:     Head: Normocephalic and atraumatic.     Mouth/Throat:     Pharynx: Oropharynx is clear.  Eyes:     Extraocular Movements: Extraocular movements intact.  Cardiovascular:     Rate and Rhythm: Normal rate and regular rhythm.  Pulmonary:     Effort: Pulmonary effort is normal.     Breath sounds: Normal breath sounds.  Chest:     Chest wall: No tenderness.  Abdominal:     General: There is no distension.     Palpations: Abdomen is soft.     Tenderness: There is no abdominal tenderness.  Musculoskeletal:     Cervical back: Normal range of motion.     Comments: Patient indicates pain around her upper humerus just below the glenohumeral joint.  No visible contusion abrasion or deformity.  No hematoma or abrasion to the thoracic back elbow or shoulder.  Patient can spontaneously move the shoulder but encounters pain at about 45 degrees forward flexion.  Radial pulse 2+ and strong.  Hand warm and dry.  Normal grip strength.  Normal range of motion at the elbow and the wrist.  Skin:    General: Skin is warm and dry.  Neurological:     General: No focal deficit present.     Mental Status: She is oriented to person, place, and time.     Motor: No weakness.     Coordination: Coordination normal.  Psychiatric:        Mood and Affect: Mood normal.     (all labs ordered are listed, but only abnormal results are displayed) Labs Reviewed - No data to display  EKG: None  Radiology: DG Shoulder Left Result Date: 09/13/2024 CLINICAL DATA:  Fall down steps with injury to left shoulder and upper arm. EXAM: LEFT SHOULDER - 2+ VIEW COMPARISON:  11/27/2016. FINDINGS: There is no evidence of acute fracture or dislocation. Mild degenerative changes are present at the acromioclavicular and glenohumeral joints. Soft tissues are unremarkable. IMPRESSION: No acute fracture or dislocation. Electronically Signed   By: Leita Birmingham M.D.   On: 09/13/2024 15:25      Procedures   Medications Ordered in the ED - No data to display                                  Medical Decision Making Amount and/or Complexity of Data Reviewed Radiology: ordered.   Fall and ice.  Patient has focal pain at the right shoulder with range of motion and upper humerus.  She does not have other areas of suspected injury by clinical exam.  Patient reports that she does anticipate being really sore and stiff over the next few days but  right now nothing else hurts.  X-rays personally reviewed by myself and reviewed by radiology,  no acute fracture or dislocation.  Upper mid humerus is well-visualized.  No appearance of fracture.  Patient presents as outlined.  No dislocation or fracture.  She is otherwise well in appearance today.  Patient reports she takes naproxen  at home for pain at baseline.  She will continue this.  She can take Exer strength Tylenol  as well.  We discussed this and ice packs over any contused or bruised area.  Patient voices understanding.  She will follow-up with PCP     Final diagnoses:  Fall, initial encounter  Contusion of left shoulder, initial encounter    ED Discharge Orders     None          Armenta Canning, MD 09/13/24 1617  "

## 2024-09-13 NOTE — ED Triage Notes (Signed)
 Mechanical slip and fall down 2 steps outside, injuring left shoulder and upper arm.  Pt denies head injury, denies blood thinners. AAOx4

## 2024-09-13 NOTE — ED Notes (Signed)

## 2024-09-13 NOTE — Discharge Instructions (Signed)
 1.  Continue your naproxen  at home as you usually take it.  You may add extra strength Tylenol  every 6 hours.  Apply ice pack to bruised or sore areas. 2.  See your doctor for recheck in about 3 to 5 days.  You will have a lot more stiffness and soreness at that time. 3.  Return to the emergency department if you have new or concerning symptoms such as bad headache, confusion, neck pain, chest pain, shortness of breath or other concerning changes.

## 2024-10-16 ENCOUNTER — Ambulatory Visit: Admitting: Student
